# Patient Record
Sex: Female | Born: 1970 | Race: Black or African American | Hispanic: No | Marital: Single | Smoking: Former smoker
Health system: Southern US, Community
[De-identification: ages and names within clinical notes are randomized; demographics above are authoritative.]

## PROBLEM LIST (undated history)

## (undated) DIAGNOSIS — E663 Overweight: Secondary | ICD-10-CM

## (undated) DIAGNOSIS — R600 Localized edema: Secondary | ICD-10-CM

## (undated) DIAGNOSIS — Z79899 Other long term (current) drug therapy: Principal | ICD-10-CM

## (undated) DIAGNOSIS — J039 Acute tonsillitis, unspecified: Secondary | ICD-10-CM

## (undated) DIAGNOSIS — Z21 Asymptomatic human immunodeficiency virus [HIV] infection status: Secondary | ICD-10-CM

## (undated) DIAGNOSIS — R079 Chest pain, unspecified: Secondary | ICD-10-CM

## (undated) DIAGNOSIS — J852 Abscess of lung without pneumonia: Secondary | ICD-10-CM

## (undated) DIAGNOSIS — R110 Nausea: Secondary | ICD-10-CM

## (undated) DIAGNOSIS — E881 Lipodystrophy, not elsewhere classified: Principal | ICD-10-CM

## (undated) DIAGNOSIS — R51 Headache: Secondary | ICD-10-CM

## (undated) DIAGNOSIS — R635 Abnormal weight gain: Secondary | ICD-10-CM

## (undated) DIAGNOSIS — R0789 Other chest pain: Secondary | ICD-10-CM

## (undated) DIAGNOSIS — J069 Acute upper respiratory infection, unspecified: Secondary | ICD-10-CM

## (undated) DIAGNOSIS — J189 Pneumonia, unspecified organism: Secondary | ICD-10-CM

## (undated) DIAGNOSIS — A159 Respiratory tuberculosis unspecified: Secondary | ICD-10-CM

## (undated) DIAGNOSIS — B20 Human immunodeficiency virus [HIV] disease: Secondary | ICD-10-CM

## (undated) DIAGNOSIS — I10 Essential (primary) hypertension: Secondary | ICD-10-CM

## (undated) HISTORY — DX: Chest pain, unspecified: R07.9

## (undated) HISTORY — DX: Other chest pain: R07.89

## (undated) HISTORY — DX: Overweight: E66.3

## (undated) HISTORY — DX: Localized edema: R60.0

## (undated) HISTORY — DX: Nausea: R11.0

## (undated) HISTORY — DX: Other long term (current) drug therapy: Z79.899

## (undated) HISTORY — DX: Headache: R51

## (undated) HISTORY — DX: Lipodystrophy, not elsewhere classified: E88.1

## (undated) HISTORY — DX: Abnormal weight gain: R63.5

## (undated) HISTORY — DX: Acute upper respiratory infection, unspecified: J06.9

## (undated) HISTORY — DX: Human immunodeficiency virus (HIV) disease: B20

## (undated) HISTORY — DX: Morbid (severe) obesity due to excess calories: E66.01

## (undated) HISTORY — DX: Abscess of lung without pneumonia: J85.2

---

## 1998-03-12 ENCOUNTER — Emergency Department (HOSPITAL_COMMUNITY): Admission: EM | Admit: 1998-03-12 | Discharge: 1998-03-12 | Payer: Self-pay | Admitting: Emergency Medicine

## 1998-11-17 ENCOUNTER — Emergency Department (HOSPITAL_COMMUNITY): Admission: EM | Admit: 1998-11-17 | Discharge: 1998-11-17 | Payer: Self-pay | Admitting: Emergency Medicine

## 1999-06-03 ENCOUNTER — Emergency Department (HOSPITAL_COMMUNITY): Admission: EM | Admit: 1999-06-03 | Discharge: 1999-06-03 | Payer: Self-pay | Admitting: Emergency Medicine

## 1999-12-31 ENCOUNTER — Emergency Department (HOSPITAL_COMMUNITY): Admission: EM | Admit: 1999-12-31 | Discharge: 1999-12-31 | Payer: Self-pay | Admitting: Emergency Medicine

## 1999-12-31 ENCOUNTER — Encounter: Payer: Self-pay | Admitting: Emergency Medicine

## 2000-04-12 ENCOUNTER — Emergency Department (HOSPITAL_COMMUNITY): Admission: EM | Admit: 2000-04-12 | Discharge: 2000-04-12 | Payer: Self-pay | Admitting: Emergency Medicine

## 2001-03-20 ENCOUNTER — Emergency Department (HOSPITAL_COMMUNITY): Admission: EM | Admit: 2001-03-20 | Discharge: 2001-03-20 | Payer: Self-pay | Admitting: Emergency Medicine

## 2001-05-12 ENCOUNTER — Emergency Department (HOSPITAL_COMMUNITY): Admission: EM | Admit: 2001-05-12 | Discharge: 2001-05-12 | Payer: Self-pay | Admitting: Emergency Medicine

## 2001-05-17 ENCOUNTER — Emergency Department (HOSPITAL_COMMUNITY): Admission: EM | Admit: 2001-05-17 | Discharge: 2001-05-17 | Payer: Self-pay | Admitting: Emergency Medicine

## 2001-07-02 ENCOUNTER — Emergency Department (HOSPITAL_COMMUNITY): Admission: EM | Admit: 2001-07-02 | Discharge: 2001-07-02 | Payer: Self-pay | Admitting: Emergency Medicine

## 2001-12-23 ENCOUNTER — Emergency Department (HOSPITAL_COMMUNITY): Admission: EM | Admit: 2001-12-23 | Discharge: 2001-12-23 | Payer: Self-pay | Admitting: Emergency Medicine

## 2001-12-29 ENCOUNTER — Emergency Department (HOSPITAL_COMMUNITY): Admission: EM | Admit: 2001-12-29 | Discharge: 2001-12-29 | Payer: Self-pay | Admitting: Emergency Medicine

## 2002-11-26 ENCOUNTER — Emergency Department (HOSPITAL_COMMUNITY): Admission: EM | Admit: 2002-11-26 | Discharge: 2002-11-26 | Payer: Self-pay | Admitting: Emergency Medicine

## 2003-02-02 ENCOUNTER — Emergency Department (HOSPITAL_COMMUNITY): Admission: EM | Admit: 2003-02-02 | Discharge: 2003-02-02 | Payer: Self-pay | Admitting: Emergency Medicine

## 2003-10-15 ENCOUNTER — Emergency Department (HOSPITAL_COMMUNITY): Admission: EM | Admit: 2003-10-15 | Discharge: 2003-10-15 | Payer: Self-pay | Admitting: Emergency Medicine

## 2003-10-16 ENCOUNTER — Emergency Department (HOSPITAL_COMMUNITY): Admission: EM | Admit: 2003-10-16 | Discharge: 2003-10-16 | Payer: Self-pay | Admitting: Family Medicine

## 2004-08-10 ENCOUNTER — Emergency Department (HOSPITAL_COMMUNITY): Admission: EM | Admit: 2004-08-10 | Discharge: 2004-08-10 | Payer: Self-pay | Admitting: Family Medicine

## 2004-08-31 ENCOUNTER — Emergency Department (HOSPITAL_COMMUNITY): Admission: EM | Admit: 2004-08-31 | Discharge: 2004-08-31 | Payer: Self-pay | Admitting: Family Medicine

## 2004-10-31 ENCOUNTER — Emergency Department (HOSPITAL_COMMUNITY): Admission: EM | Admit: 2004-10-31 | Discharge: 2004-10-31 | Payer: Self-pay | Admitting: Family Medicine

## 2005-03-30 ENCOUNTER — Emergency Department (HOSPITAL_COMMUNITY): Admission: EM | Admit: 2005-03-30 | Discharge: 2005-03-30 | Payer: Self-pay | Admitting: Family Medicine

## 2005-05-01 ENCOUNTER — Emergency Department (HOSPITAL_COMMUNITY): Admission: EM | Admit: 2005-05-01 | Discharge: 2005-05-01 | Payer: Self-pay | Admitting: Family Medicine

## 2005-05-07 ENCOUNTER — Emergency Department (HOSPITAL_COMMUNITY): Admission: EM | Admit: 2005-05-07 | Discharge: 2005-05-07 | Payer: Self-pay | Admitting: Family Medicine

## 2005-07-12 ENCOUNTER — Emergency Department (HOSPITAL_COMMUNITY): Admission: EM | Admit: 2005-07-12 | Discharge: 2005-07-12 | Payer: Self-pay | Admitting: Family Medicine

## 2005-07-20 ENCOUNTER — Emergency Department (HOSPITAL_COMMUNITY): Admission: EM | Admit: 2005-07-20 | Discharge: 2005-07-20 | Payer: Self-pay | Admitting: Family Medicine

## 2005-07-23 ENCOUNTER — Emergency Department (HOSPITAL_COMMUNITY): Admission: EM | Admit: 2005-07-23 | Discharge: 2005-07-23 | Payer: Self-pay | Admitting: Emergency Medicine

## 2006-02-01 ENCOUNTER — Emergency Department (HOSPITAL_COMMUNITY): Admission: EM | Admit: 2006-02-01 | Discharge: 2006-02-01 | Payer: Self-pay | Admitting: Family Medicine

## 2006-03-10 ENCOUNTER — Emergency Department (HOSPITAL_COMMUNITY): Admission: EM | Admit: 2006-03-10 | Discharge: 2006-03-10 | Payer: Self-pay | Admitting: Family Medicine

## 2006-07-03 ENCOUNTER — Emergency Department (HOSPITAL_COMMUNITY): Admission: EM | Admit: 2006-07-03 | Discharge: 2006-07-03 | Payer: Self-pay | Admitting: Family Medicine

## 2006-07-26 ENCOUNTER — Emergency Department (HOSPITAL_COMMUNITY): Admission: EM | Admit: 2006-07-26 | Discharge: 2006-07-26 | Payer: Self-pay | Admitting: Emergency Medicine

## 2006-08-05 ENCOUNTER — Emergency Department (HOSPITAL_COMMUNITY): Admission: EM | Admit: 2006-08-05 | Discharge: 2006-08-05 | Payer: Self-pay | Admitting: Emergency Medicine

## 2006-09-14 ENCOUNTER — Emergency Department (HOSPITAL_COMMUNITY): Admission: EM | Admit: 2006-09-14 | Discharge: 2006-09-14 | Payer: Self-pay | Admitting: Emergency Medicine

## 2006-11-05 ENCOUNTER — Emergency Department (HOSPITAL_COMMUNITY): Admission: EM | Admit: 2006-11-05 | Discharge: 2006-11-05 | Payer: Self-pay | Admitting: Family Medicine

## 2006-12-02 ENCOUNTER — Emergency Department (HOSPITAL_COMMUNITY): Admission: EM | Admit: 2006-12-02 | Discharge: 2006-12-02 | Payer: Self-pay | Admitting: Family Medicine

## 2007-02-21 ENCOUNTER — Emergency Department (HOSPITAL_COMMUNITY): Admission: EM | Admit: 2007-02-21 | Discharge: 2007-02-21 | Payer: Self-pay | Admitting: Emergency Medicine

## 2007-05-02 ENCOUNTER — Emergency Department (HOSPITAL_COMMUNITY): Admission: EM | Admit: 2007-05-02 | Discharge: 2007-05-02 | Payer: Self-pay | Admitting: Family Medicine

## 2007-06-22 ENCOUNTER — Emergency Department (HOSPITAL_COMMUNITY): Admission: EM | Admit: 2007-06-22 | Discharge: 2007-06-22 | Payer: Self-pay | Admitting: Family Medicine

## 2007-07-03 ENCOUNTER — Emergency Department (HOSPITAL_COMMUNITY): Admission: EM | Admit: 2007-07-03 | Discharge: 2007-07-03 | Payer: Self-pay | Admitting: Emergency Medicine

## 2007-08-04 ENCOUNTER — Emergency Department (HOSPITAL_COMMUNITY): Admission: EM | Admit: 2007-08-04 | Discharge: 2007-08-04 | Payer: Self-pay | Admitting: Family Medicine

## 2007-11-18 ENCOUNTER — Emergency Department (HOSPITAL_COMMUNITY): Admission: EM | Admit: 2007-11-18 | Discharge: 2007-11-18 | Payer: Self-pay | Admitting: Emergency Medicine

## 2010-04-03 ENCOUNTER — Emergency Department (HOSPITAL_COMMUNITY): Admission: EM | Admit: 2010-04-03 | Discharge: 2010-04-03 | Payer: Self-pay | Admitting: Emergency Medicine

## 2011-06-05 LAB — I-STAT 8, (EC8 V) (CONVERTED LAB)
Acid-base deficit: 1
BUN: 10
Bicarbonate: 24.3 — ABNORMAL HIGH
Chloride: 108
Glucose, Bld: 92
HCT: 39
Hemoglobin: 13.3
Operator id: 161631
Potassium: 3.7
Sodium: 140
TCO2: 26
pCO2, Ven: 42.5 — ABNORMAL LOW
pH, Ven: 7.366 — ABNORMAL HIGH

## 2011-06-05 LAB — POCT CARDIAC MARKERS
CKMB, poc: <1 — ABNORMAL LOW
Myoglobin, poc: 52.7
Operator id: 161631
Troponin i, poc: <0.05

## 2011-06-06 LAB — I-STAT 8, (EC8 V) (CONVERTED LAB)
BUN: 10
Bicarbonate: 20.6
Chloride: 110
HCT: 42
Hemoglobin: 14.3
Operator id: 284141
Potassium: 4.1
Sodium: 138

## 2011-06-06 LAB — POCT I-STAT CREATININE: Creatinine, Ser: 0.8

## 2011-10-29 ENCOUNTER — Emergency Department (HOSPITAL_COMMUNITY)
Admission: EM | Admit: 2011-10-29 | Discharge: 2011-10-29 | Disposition: A | Discharge status: Home / Self Care | Payer: Self-pay | Attending: Emergency Medicine | Admitting: Emergency Medicine

## 2011-10-29 ENCOUNTER — Encounter (HOSPITAL_COMMUNITY): Payer: Self-pay

## 2011-10-29 ENCOUNTER — Emergency Department (HOSPITAL_COMMUNITY): Payer: Self-pay

## 2011-10-29 DIAGNOSIS — J029 Acute pharyngitis, unspecified: Secondary | ICD-10-CM | POA: Insufficient documentation

## 2011-10-29 DIAGNOSIS — I10 Essential (primary) hypertension: Secondary | ICD-10-CM | POA: Insufficient documentation

## 2011-10-29 HISTORY — DX: Essential (primary) hypertension: I10

## 2011-10-29 HISTORY — DX: Acute tonsillitis, unspecified: J03.90

## 2011-10-29 LAB — POCT I-STAT, CHEM 8
Calcium, Ion: 1.14 mmol/L (ref 1.12–1.32)
Chloride: 105 mEq/L (ref 96–112)
HCT: 37 % (ref 36.0–46.0)
Hemoglobin: 12.6 g/dL (ref 12.0–15.0)
TCO2: 24 mmol/L (ref 0–100)

## 2011-10-29 LAB — BASIC METABOLIC PANEL
BUN: 3 mg/dL — ABNORMAL LOW (ref 6–23)
Creatinine, Ser: 0.54 mg/dL (ref 0.50–1.10)
GFR calc Af Amer: >90 mL/min (ref >90)
GFR calc non Af Amer: >90 mL/min (ref >90)
Potassium: 3.5 mEq/L (ref 3.5–5.1)

## 2011-10-29 LAB — CBC
HCT: 34.4 % — ABNORMAL LOW (ref 36.0–46.0)
MCHC: 34.3 g/dL (ref 30.0–36.0)
RDW: 13.8 % (ref 11.5–15.5)

## 2011-10-29 MED ORDER — SODIUM CHLORIDE 0.9 % IV BOLUS (SEPSIS)
1000.0000 mL | Freq: Once | INTRAVENOUS | Status: AC
  Administered 2011-10-29: 1000 mL via INTRAVENOUS

## 2011-10-29 MED ORDER — IBUPROFEN 600 MG PO TABS: 600.0000 mg | ORAL_TABLET | Freq: Four times a day (QID) | ORAL | Status: AC | PRN

## 2011-10-29 MED ORDER — HYDROCODONE-ACETAMINOPHEN 5-500 MG PO TABS: 1.0000 | ORAL_TABLET | Freq: Four times a day (QID) | ORAL | Status: AC | PRN

## 2011-10-29 MED ORDER — IOHEXOL 300 MG/ML  SOLN
75.0000 mL | Freq: Once | INTRAMUSCULAR | Status: AC | PRN
  Administered 2011-10-29: 75 mL via INTRAVENOUS

## 2011-10-29 MED ORDER — AMOXICILLIN 500 MG PO CAPS: 500.0000 mg | ORAL_CAPSULE | Freq: Three times a day (TID) | ORAL | Status: AC

## 2011-10-29 MED ORDER — DEXTROSE 5 % IV SOLN
1.0000 g | INTRAVENOUS | Status: DC
  Administered 2011-10-29: 1 g via INTRAVENOUS
  Filled 2011-10-29: qty 10

## 2011-10-29 MED ORDER — DEXAMETHASONE SODIUM PHOSPHATE 4 MG/ML IJ SOLN
10.0000 mg | Freq: Once | INTRAMUSCULAR | Status: AC
  Administered 2011-10-29: 10 mg via INTRAVENOUS
  Filled 2011-10-29: qty 3

## 2011-10-29 MED ORDER — ACETAMINOPHEN 500 MG PO TABS
1000.0000 mg | ORAL_TABLET | Freq: Once | ORAL | Status: AC
  Administered 2011-10-29: 1000 mg via ORAL
  Filled 2011-10-29: qty 2

## 2011-10-29 NOTE — ED Notes (Signed)
Patient presents with swollen tonsils since this past Saturday. Patient reports difficulty breathing and is spiting in an emesis bag, patient reports she only able to swallow a small amount.  Patient has history of swollen tonsils in past. Patient speaking in full, complete sentences, no respiratory distress noted at this time.

## 2011-10-29 NOTE — ED Provider Notes (Addendum)
History     CSN: [id number]  Arrival date & time 10/29/11  1239   First MD Initiated Contact with Patient 10/29/11 1450      Chief Complaint  Patient presents with  . Sore Throat    (Consider location/radiation/quality/duration/timing/severity/associated sxs/prior treatment) Patient is a 41 y.o. female presenting with pharyngitis. The history is provided by the patient.  Sore Throat Pertinent negatives include no abdominal pain and no shortness of breath.  pt c/o sore throat for past 2 days. States very painful to swallow. No shortness or breathing. Denies nasal congestion or cough. No headache. No abd pain or nvd. No known ill contacts. No known mono or strep exposure. Pain is bilateral. No trouble speaking. Pain dull, constant, worse w swallowing.   Past Medical History  Diagnosis Date  . Tonsillitis   . Hypertension     Past Surgical History  Procedure Date  . Cesarean section     History reviewed. No pertinent family history.  History  Substance Use Topics  . Smoking status: Never Smoker   . Smokeless tobacco: Not on file  . Alcohol Use: No    OB History    Grav Para Term Preterm Abortions TAB SAB Ect Mult Living                  Review of Systems  Constitutional: Positive for fever. Negative for chills.  HENT: Negative for neck pain and neck stiffness.   Respiratory: Negative for cough and shortness of breath.   Gastrointestinal: Negative for vomiting, abdominal pain and diarrhea.  Skin: Negative for rash.  Neurological: Negative for light-headedness.    Allergies  Review of patient's allergies indicates no known allergies.  Home Medications   Current Outpatient Rx  Name Route Sig Dispense Refill  . ACETAMINOPHEN 500 MG PO TABS Oral Take 1,000 mg by mouth every 6 (six) hours as needed. For pain    . PRESCRIPTION MEDICATION  Pt states she takes a BP pill. Called pharmacy and they don't have anything on file      BP 173/126  Pulse 103  Temp(Src)  99.4 F (37.4 C) (Oral)  Resp 19  SpO2 98%  LMP 08/31/2011  Physical Exam  Nursing note and vitals reviewed. Constitutional: She is oriented to person, place, and time. She appears well-developed and well-nourished. No distress.  HENT:  Nose: Nose normal.       bil tonsils quite swollen. +exudate. No asymmetric swelling or abscess noted. No trismus. No stridor.   Eyes: Conjunctivae are normal. No scleral icterus.  Neck: Neck supple. No tracheal deviation present.       No neck mass or swelling noted. Ant cerv l/a, no post cerv l/a. No stiffness or rigidity  Cardiovascular: Normal rate, regular rhythm, normal heart sounds and intact distal pulses.   Pulmonary/Chest: Effort normal and breath sounds normal. No respiratory distress.       No stridor  Abdominal: Soft. Normal appearance. She exhibits no distension. There is no tenderness.       No hsm.   Musculoskeletal: She exhibits no edema and no tenderness.  Neurological: She is alert and oriented to person, place, and time.  Skin: Skin is warm and dry. No rash noted.       No rash, no petechia  Psychiatric: She has a normal mood and affect.    ED Course  Procedures (including critical care time)    MDM  Poor po intake today due to painful swallowing. Mod bil swelling  noted. No stridor or inc wob. 1 liter ns iv. Decadron iv. Rocephin iv.    Recheck feels improved. No increased wob. No stridor. No trouble swallowing or handling secretions. Discussed need close f/u pcp, or here if unable to see a pcp, in next couple days for recheck, as well as for recheck bp.   On recheck prior to d/c pt notes worsening left neck/left throat pain, trouble swallowing. Is in no resp distress. Will get ct r/o abscess. [facility].       Suzi Roots, MD 10/29/11 1707  Suzi Roots, MD 10/29/11 [phone]

## 2011-10-29 NOTE — Discharge Instructions (Signed)
Drink plenty of fluids. Take antibiotic (amoxicillin) as prescribed. Take motrin as need for pain.  You may also take vicodin as need for pain. No driving for the next 6 hours or when taking vicodin. Also, do not take tylenol or acetaminophen containing medication when taking vicodin.   You may try throat lozenges as need for symptom relief.   Follow up with primary care doctor in 1-2 days for recheck.  Your blood pressure is also quite high today - you must follow up with primary care doctor in the next couple days for recheck of blood pressure.   Your ct scan was read as showing pharyngitis, but no abscess. Note was also made of upper dental abscesses - follow up with dentist in coming week.   Return to ER right away if worse, unable to swallow, trouble breathing, worsening one-sided throat pain or swelling, other concern.       Pharyngitis, Viral and Bacterial Pharyngitis is soreness (inflammation) or infection of the pharynx. It is also called a sore throat. CAUSES  Most sore throats are caused by viruses and are part of a cold. However, some sore throats are caused by strep and other bacteria. Sore throats can also be caused by post nasal drip from draining sinuses, allergies and sometimes from sleeping with an open mouth. Infectious sore throats can be spread from person to person by coughing, sneezing and sharing cups or eating utensils. TREATMENT  Sore throats that are viral usually last 3-4 days. Viral illness will get better without medications (antibiotics). Strep throat and other bacterial infections will usually begin to get better about 24-48 hours after you begin to take antibiotics. HOME CARE INSTRUCTIONS   If the caregiver feels there is a bacterial infection or if there is a positive strep test, they will prescribe an antibiotic. The full course of antibiotics must be taken. If the full course of antibiotic is not taken, you or your child may become ill again. If you or your  child has strep throat and do not finish all of the medication, serious heart or kidney diseases may develop.   Drink enough water and fluids to keep your urine clear or pale yellow.   Only take over-the-counter or prescription medicines for pain, discomfort or fever as directed by your caregiver.   Get lots of rest.   Gargle with salt water ( tsp. of salt in a glass of water) as often as every 1-2 hours as you need for comfort.   Hard candies may soothe the throat if individual is not at risk for choking. Throat sprays or lozenges may also be used.  SEEK MEDICAL CARE IF:   Large, tender lumps in the neck develop.   A rash develops.   Green, yellow-brown or bloody sputum is coughed up.   Your baby is older than 3 months with a rectal temperature of 100.5 F (38.1 C) or higher for more than 1 day.  SEEK IMMEDIATE MEDICAL CARE IF:   A stiff neck develops.   You or your child are drooling or unable to swallow liquids.   You or your child are vomiting, unable to keep medications or liquids down.   You or your child has severe pain, unrelieved with recommended medications.   You or your child are having difficulty breathing (not due to stuffy nose).   You or your child are unable to fully open your mouth.   You or your child develop redness, swelling, or severe pain anywhere on the  neck.   You have a fever.   Your baby is older than 3 months with a rectal temperature of 102 F (38.9 C) or higher.   Your baby is 39 months old or younger with a rectal temperature of 100.4 F (38 C) or higher.  MAKE SURE YOU:   Understand these instructions.   Will watch your condition.   Will get help right away if you are not doing well or get worse.  Document Released: 08/13/2005 Document Revised: 08/02/2011 Document Reviewed: 11/10/2007 [facility] Patient Information 2012 [place], [place].     Sore Throat Sore throats may be caused by bacteria and viruses. They may also be caused  by:  Smoking.   Pollution.   Allergies.  If a sore throat is due to strep infection (a bacterial infection), you may need:  A throat swab.   A culture test to verify the strep infection.  You will need one of these:  An antibiotic shot.   Oral medicine for a full 10 days.  Strep infection is very contagious. A doctor should check any close contacts who have a sore throat or fever. A sore throat caused by a virus infection will usually last only 3-4 days. Antibiotics will not treat a viral sore throat.  Infectious mononucleosis (a viral disease), however, can cause a sore throat that lasts for up to 3 weeks. Mononucleosis can be diagnosed with blood tests. You must have been sick for at least 1 week in order for the test to give accurate results. HOME CARE INSTRUCTIONS   To treat a sore throat, take mild pain medicine.   Increase your fluids.   Eat a soft diet.   Do not smoke.   Gargling with warm water or salt water (1 tsp. salt in 8 oz. water) can be helpful.   Try throat sprays or lozenges or sucking on hard candy to ease the symptoms.  Call your doctor if your sore throat lasts longer than 1 week.  SEEK IMMEDIATE MEDICAL CARE IF:  You have difficulty breathing.   You have increased swelling in the throat.   You have pain so severe that you are unable to swallow fluids or your saliva.   You have a severe headache, a high fever, vomiting, or a red rash.  Document Released: 09/20/2004 Document Revised: 08/02/2011 Document Reviewed: 07/31/2007 [facility] Patient Information 2012 [place], [place].    Hypertension As your heart beats, it forces blood through your arteries. This force is your blood pressure. If the pressure is too high, it is called hypertension (HTN) or high blood pressure. HTN is dangerous because you may have it and not know it. High blood pressure may mean that your heart has to work harder to pump blood. Your arteries may be narrow or stiff. The extra  work puts you at risk for heart disease, stroke, and other problems.  Blood pressure consists of two numbers, a higher number over a lower, 110/72, for example. It is stated as "110 over 72." The ideal is below 120 for the top number (systolic) and under 80 for the bottom (diastolic). Write down your blood pressure today. You should pay close attention to your blood pressure if you have certain conditions such as:  Heart failure.   Prior heart attack.   Diabetes   Chronic kidney disease.   Prior stroke.   Multiple risk factors for heart disease.  To see if you have HTN, your blood pressure should be measured while you are seated with  your arm held at the level of the heart. It should be measured at least twice. A one-time elevated blood pressure reading (especially in the Emergency Department) does not mean that you need treatment. There may be conditions in which the blood pressure is different between your right and left arms. It is important to see your caregiver soon for a recheck. Most people have essential hypertension which means that there is not a specific cause. This type of high blood pressure may be lowered by changing lifestyle factors such as:  Stress.   Smoking.   Lack of exercise.   Excessive weight.   Drug/tobacco/alcohol use.   Eating less salt.  Most people do not have symptoms from high blood pressure until it has caused damage to the body. Effective treatment can often prevent, delay or reduce that damage. TREATMENT  When a cause has been identified, treatment for high blood pressure is directed at the cause. There are a large number of medications to treat HTN. These fall into several categories, and your caregiver will help you select the medicines that are best for you. Medications may have side effects. You should review side effects with your caregiver. If your blood pressure stays high after you have made lifestyle changes or started on medicines,   Your  medication(s) may need to be changed.   Other problems may need to be addressed.   Be certain you understand your prescriptions, and know how and when to take your medicine.   Be sure to follow up with your caregiver within the time frame advised (usually within two weeks) to have your blood pressure rechecked and to review your medications.   If you are taking more than one medicine to lower your blood pressure, make sure you know how and at what times they should be taken. Taking two medicines at the same time can result in blood pressure that is too low.  SEEK IMMEDIATE MEDICAL CARE IF:  You develop a severe headache, blurred or changing vision, or confusion.   You have unusual weakness or numbness, or a faint feeling.   You have severe chest or abdominal pain, vomiting, or breathing problems.  MAKE SURE YOU:   Understand these instructions.   Will watch your condition.   Will get help right away if you are not doing well or get worse.  Document Released: 08/13/2005 Document Revised: 08/02/2011 Document Reviewed: 04/02/2008 [facility] Patient Information 2012 [place], [place].          RESOURCE GUIDE  Dental Problems  Patients with Medicaid: [facility] [phone]                                           [phone] Phone:  [phone]                                                  Phone:  [phone]  If unable to pay or uninsured, contact:  [facility] or [facility]. to become qualified for the adult dental clinic.  Chronic Pain Problems Contact [facility]  [phone] Patients need to be referred by their primary care doctor.  Insufficient Money for Medicine Contact [employer]:  call "211" or [facility] [phone].  No Primary Care Doctor Call [facility]  [phone] Other agencies that provide inexpensive medical care    [facility]   [phone]    [facility]  [phone]    [facility]  [phone]    [facility]  [phone]    [facility]  [phone]    [facility]  [phone]  Psychological Services [facility] Behavior[facility]  [phone] [facility] Services  [phone] [facility]   [phone] (emergency services [phone])  Substance Abuse Resources [facility]  [phone] [facility] [phone] [facility] [phone] Floydene Flock [phone] Residential & Outpatient Substance Abuse Program  [phone]  Abuse/Neglect [facility] Child Abuse Hotline 7192183764 [facility] Child Abuse Hotline [phone] (After Hours)  Emergency Shelter [facility] 7021865809  Maternity Homes [facility] [place] [facility] [phone] [facility] [phone]  MRSA Hotline #:   (586)858-0059    [facility] Resources  [facility]     [employer]                          [facility] Dept. [address] [place]                       [address]      [address]  Blondell Reveal Phone:  [phone]                                   Phone:  [phone]                 Phone:  [phone]  [facility] Phone:  [phone]  [facility] Child Abuse Hotline [phone] [phone] (After Hours)

## 2012-10-14 ENCOUNTER — Emergency Department (HOSPITAL_COMMUNITY)
Admission: EM | Admit: 2012-10-14 | Discharge: 2012-10-14 | Disposition: A | Discharge status: Home / Self Care | Payer: Self-pay | Attending: Emergency Medicine | Admitting: Emergency Medicine

## 2012-10-14 ENCOUNTER — Encounter (HOSPITAL_COMMUNITY): Payer: Self-pay | Admitting: Neurology

## 2012-10-14 DIAGNOSIS — K047 Periapical abscess without sinus: Secondary | ICD-10-CM | POA: Insufficient documentation

## 2012-10-14 DIAGNOSIS — K089 Disorder of teeth and supporting structures, unspecified: Secondary | ICD-10-CM | POA: Insufficient documentation

## 2012-10-14 DIAGNOSIS — I10 Essential (primary) hypertension: Secondary | ICD-10-CM | POA: Insufficient documentation

## 2012-10-14 DIAGNOSIS — H9209 Otalgia, unspecified ear: Secondary | ICD-10-CM | POA: Insufficient documentation

## 2012-10-14 DIAGNOSIS — K0889 Other specified disorders of teeth and supporting structures: Secondary | ICD-10-CM

## 2012-10-14 MED ORDER — OXYCODONE-ACETAMINOPHEN 5-325 MG PO TABS
2.0000 | ORAL_TABLET | Freq: Once | ORAL | Status: AC
  Administered 2012-10-14: 2 via ORAL
  Filled 2012-10-14: qty 2

## 2012-10-14 MED ORDER — PENICILLIN V POTASSIUM 500 MG PO TABS: 500.0000 mg | ORAL_TABLET | Freq: Four times a day (QID) | ORAL | Status: AC

## 2012-10-14 MED ORDER — OXYCODONE-ACETAMINOPHEN 5-325 MG PO TABS: 2.0000 | ORAL_TABLET | ORAL | Status: DC | PRN

## 2012-10-14 NOTE — ED Notes (Signed)
Pt reporting tooth abscess x several days. Right cheek swollen, painful to touch. Denies drainage. Airway intact. Alert and oriented

## 2012-10-15 NOTE — ED Provider Notes (Signed)
History     CSN: [id number]  Arrival date & time 10/14/12  0827   None     Chief Complaint  Patient presents with  . Abscess    (Consider location/radiation/quality/duration/timing/severity/associated sxs/prior treatment) Patient is a 42 y.o. female presenting with abscess. The history is provided by the patient.  Abscess Location:  Mouth Mouth abscess location:  R inner cheek Abscess quality: induration, painful, redness and warmth   Abscess quality: not draining and no fluctuance   Red streaking: no   Progression:  Worsening Pain details:    Quality:  Aching, hot and throbbing   Severity:  Severe   Timing:  Constant   Progression:  Worsening Chronicity:  New Context: not diabetes, not immunosuppression, not injected drug use, not insect bite/sting and not skin injury   Relieved by:  NSAIDs Ineffective treatments:  None tried Associated symptoms: no fever   Risk factors: no prior abscess   .  Past Medical History  Diagnosis Date  . Tonsillitis   . Hypertension     Past Surgical History  Procedure Laterality Date  . Cesarean section      No family history on file.  History  Substance Use Topics  . Smoking status: Never Smoker   . Smokeless tobacco: Not on file  . Alcohol Use: No    OB History   Grav Para Term Preterm Abortions TAB SAB Ect Mult Living                  Review of Systems  Constitutional: Negative for fever and chills.  HENT: Positive for ear pain and dental problem. Negative for sore throat and mouth sores.   Eyes: Negative.   Respiratory: Negative.  Negative for shortness of breath and stridor.   Gastrointestinal: Negative.   Endocrine: Negative.   Genitourinary: Negative.   Musculoskeletal: Negative.   Skin: Negative.   Allergic/Immunologic: Negative.   Neurological: Negative.   Psychiatric/Behavioral: Negative.     Allergies  Review of patient's allergies indicates no known allergies.  Home Medications   Current  Outpatient Rx  Name  Route  Sig  Dispense  Refill  . acetaminophen (TYLENOL) 500 MG tablet   Oral   Take 1,000 mg by mouth every 6 (six) hours as needed. For pain         . oxyCODONE-acetaminophen (PERCOCET) 5-325 MG per tablet   Oral   Take 2 tablets by mouth every 4 (four) hours as needed for pain.   10 tablet   0   . penicillin v potassium (VEETID) 500 MG tablet   Oral   Take 1 tablet (500 mg total) by mouth 4 (four) times daily.   40 tablet   0     BP 154/108  Pulse 96  Temp(Src) 99.1 F (37.3 C) (Oral)  Resp 20  Ht 5\' 1"  (1.549 m)  Wt 150 lb (68.04 kg)  BMI 28.36 kg/m2  SpO2 100%  Physical Exam  Constitutional: She is oriented to person, place, and time. She appears well-developed and well-nourished. No distress.  HENT:  Head: Normocephalic and atraumatic. No trismus in the jaw.    Mouth/Throat: Uvula is midline and oropharynx is clear and moist. Dental abscesses and dental caries present. No edematous. No posterior oropharyngeal edema.  Eyes: Conjunctivae are normal. No scleral icterus.  Neck: Normal range of motion.  Cardiovascular: Normal rate, regular rhythm and normal heart sounds.  Exam reveals no gallop and no friction rub.   No murmur heard. Pulmonary/Chest:  Effort normal and breath sounds normal. No respiratory distress.  Abdominal: Soft. Bowel sounds are normal. She exhibits no distension and no mass. There is no tenderness. There is no guarding.  Neurological: She is alert and oriented to person, place, and time.  Skin: Skin is warm and dry. She is not diaphoretic.    ED Course  Procedures (including critical care time)  Labs Reviewed - No data to display No results found.   1. Pain, dental   2. Dental abscess       MDM  BP 154/108  Pulse 96  Temp(Src) 99.1 F (37.3 C) (Oral)  Resp 20  Ht 5\' 1"  (1.549 m)  Wt 150 lb (68.04 kg)  BMI 28.36 kg/m2  SpO2 100% Patient with toothache. abssces present, but not amenable to incesion. .   Exam unconcerning for Ludwig's angina or spread of infection.  Will treat with penicillin and pain medicine.  Urged patient to follow-up with dentist.           Arthor Captain, PA-C 10/15/12 1705

## 2012-10-16 NOTE — ED Provider Notes (Signed)
Medical screening examination/treatment/procedure(s) were performed by non-physician practitioner and as supervising physician I was immediately available for consultation/collaboration.   Michaell Grider III, MD 10/16/12 1321 

## 2013-01-30 ENCOUNTER — Telehealth: Payer: Self-pay

## 2013-01-30 NOTE — Telephone Encounter (Signed)
Tiera calling to see if patient has appointment.   She was informed several attempts made to reach the patent via phone with messages left on her voicemail.  The patient is saying I did not call.   I called patient this morning to schedule appointment and she answered the phone.  Appointment given for February 10, 2013 @ 10:30 am .  Laurell Josephs, RN

## 2013-02-10 ENCOUNTER — Ambulatory Visit: Payer: Self-pay

## 2013-02-12 ENCOUNTER — Ambulatory Visit: Payer: Self-pay

## 2013-04-09 ENCOUNTER — Emergency Department (HOSPITAL_COMMUNITY): Payer: Self-pay

## 2013-04-09 ENCOUNTER — Inpatient Hospital Stay (HOSPITAL_COMMUNITY)
Admission: EM | Admit: 2013-04-09 | Discharge: 2013-04-22 | DRG: 974 | Disposition: A | Discharge status: Home / Self Care | Payer: MEDICAID | Attending: Internal Medicine | Admitting: Internal Medicine

## 2013-04-09 ENCOUNTER — Encounter (HOSPITAL_COMMUNITY): Payer: Self-pay | Admitting: Neurology

## 2013-04-09 DIAGNOSIS — S270XXS Traumatic pneumothorax, sequela: Secondary | ICD-10-CM

## 2013-04-09 DIAGNOSIS — J852 Abscess of lung without pneumonia: Secondary | ICD-10-CM | POA: Diagnosis present

## 2013-04-09 DIAGNOSIS — Z79899 Other long term (current) drug therapy: Secondary | ICD-10-CM

## 2013-04-09 DIAGNOSIS — K029 Dental caries, unspecified: Secondary | ICD-10-CM | POA: Diagnosis present

## 2013-04-09 DIAGNOSIS — N179 Acute kidney failure, unspecified: Secondary | ICD-10-CM

## 2013-04-09 DIAGNOSIS — E871 Hypo-osmolality and hyponatremia: Secondary | ICD-10-CM

## 2013-04-09 DIAGNOSIS — I10 Essential (primary) hypertension: Secondary | ICD-10-CM | POA: Diagnosis present

## 2013-04-09 DIAGNOSIS — B20 Human immunodeficiency virus [HIV] disease: Secondary | ICD-10-CM

## 2013-04-09 DIAGNOSIS — J9600 Acute respiratory failure, unspecified whether with hypoxia or hypercapnia: Secondary | ICD-10-CM | POA: Diagnosis present

## 2013-04-09 DIAGNOSIS — Z87891 Personal history of nicotine dependence: Secondary | ICD-10-CM

## 2013-04-09 DIAGNOSIS — J851 Abscess of lung with pneumonia: Secondary | ICD-10-CM | POA: Diagnosis present

## 2013-04-09 DIAGNOSIS — Z7982 Long term (current) use of aspirin: Secondary | ICD-10-CM

## 2013-04-09 DIAGNOSIS — Z113 Encounter for screening for infections with a predominantly sexual mode of transmission: Secondary | ICD-10-CM

## 2013-04-09 DIAGNOSIS — R6510 Systemic inflammatory response syndrome (SIRS) of non-infectious origin without acute organ dysfunction: Secondary | ICD-10-CM

## 2013-04-09 DIAGNOSIS — A419 Sepsis, unspecified organism: Secondary | ICD-10-CM

## 2013-04-09 DIAGNOSIS — J9601 Acute respiratory failure with hypoxia: Secondary | ICD-10-CM

## 2013-04-09 DIAGNOSIS — B370 Candidal stomatitis: Secondary | ICD-10-CM | POA: Diagnosis not present

## 2013-04-09 DIAGNOSIS — R918 Other nonspecific abnormal finding of lung field: Secondary | ICD-10-CM

## 2013-04-09 DIAGNOSIS — J189 Pneumonia, unspecified organism: Secondary | ICD-10-CM | POA: Diagnosis present

## 2013-04-09 DIAGNOSIS — E876 Hypokalemia: Secondary | ICD-10-CM | POA: Diagnosis present

## 2013-04-09 DIAGNOSIS — E46 Unspecified protein-calorie malnutrition: Secondary | ICD-10-CM | POA: Diagnosis present

## 2013-04-09 DIAGNOSIS — A150 Tuberculosis of lung: Secondary | ICD-10-CM | POA: Diagnosis present

## 2013-04-09 DIAGNOSIS — J95811 Postprocedural pneumothorax: Secondary | ICD-10-CM | POA: Diagnosis not present

## 2013-04-09 LAB — BASIC METABOLIC PANEL
CO2: 18 mEq/L — ABNORMAL LOW (ref 19–32)
Chloride: 98 mEq/L (ref 96–112)
Glucose, Bld: 93 mg/dL (ref 70–99)
Sodium: 128 mEq/L — ABNORMAL LOW (ref 135–145)

## 2013-04-09 LAB — CBC
Hemoglobin: 10.8 g/dL — ABNORMAL LOW (ref 12.0–15.0)
MCV: 82.2 fL (ref 78.0–100.0)
Platelets: 199 10*3/uL (ref 150–400)
RBC: 3.59 MIL/uL — ABNORMAL LOW (ref 3.87–5.11)
WBC: 15.3 10*3/uL — ABNORMAL HIGH (ref 4.0–10.5)

## 2013-04-09 LAB — COMPREHENSIVE METABOLIC PANEL
ALT: 22 U/L (ref 0–35)
AST: 38 U/L — ABNORMAL HIGH (ref 0–37)
Albumin: 2.2 g/dL — ABNORMAL LOW (ref 3.5–5.2)
CO2: 18 mEq/L — ABNORMAL LOW (ref 19–32)
Calcium: 8.6 mg/dL (ref 8.4–10.5)
Chloride: 95 mEq/L — ABNORMAL LOW (ref 96–112)
GFR calc non Af Amer: 37 mL/min — ABNORMAL LOW (ref >90)
Sodium: 125 mEq/L — ABNORMAL LOW (ref 135–145)
Total Bilirubin: 0.6 mg/dL (ref 0.3–1.2)

## 2013-04-09 LAB — CBC WITH DIFFERENTIAL/PLATELET
Basophils Absolute: 0 10*3/uL (ref 0.0–0.1)
Lymphocytes Relative: 8 % — ABNORMAL LOW (ref 12–46)
Neutro Abs: 11.4 10*3/uL — ABNORMAL HIGH (ref 1.7–7.7)
Platelets: 190 10*3/uL (ref 150–400)
RDW: 12.7 % (ref 11.5–15.5)
WBC: 13.7 10*3/uL — ABNORMAL HIGH (ref 4.0–10.5)

## 2013-04-09 LAB — MRSA PCR SCREENING: MRSA by PCR: NEGATIVE

## 2013-04-09 LAB — CG4 I-STAT (LACTIC ACID): Lactic Acid, Venous: 1.02 mmol/L (ref 0.5–2.2)

## 2013-04-09 LAB — MAGNESIUM: Magnesium: 1.6 mg/dL (ref 1.5–2.5)

## 2013-04-09 MED ORDER — ACETAMINOPHEN 650 MG RE SUPP
650.0000 mg | Freq: Four times a day (QID) | RECTAL | Status: DC | PRN
  Administered 2013-04-15 – 2013-04-16 (×2): 650 mg via RECTAL
  Filled 2013-04-09 (×2): qty 1

## 2013-04-09 MED ORDER — SODIUM CHLORIDE 0.9 % IV BOLUS (SEPSIS)
1000.0000 mL | Freq: Once | INTRAVENOUS | Status: AC
  Administered 2013-04-09: 1000 mL via INTRAVENOUS

## 2013-04-09 MED ORDER — CLINDAMYCIN PHOSPHATE 600 MG/50ML IV SOLN
600.0000 mg | Freq: Once | INTRAVENOUS | Status: AC
  Administered 2013-04-09: 600 mg via INTRAVENOUS
  Filled 2013-04-09: qty 50

## 2013-04-09 MED ORDER — ACETAMINOPHEN 325 MG PO TABS
650.0000 mg | ORAL_TABLET | Freq: Once | ORAL | Status: AC
  Administered 2013-04-09: 650 mg via ORAL
  Filled 2013-04-09: qty 2

## 2013-04-09 MED ORDER — DEXTROSE 5 % IV SOLN
500.0000 mg | Freq: Once | INTRAVENOUS | Status: AC
  Administered 2013-04-09: 500 mg via INTRAVENOUS
  Filled 2013-04-09: qty 500

## 2013-04-09 MED ORDER — ACETAMINOPHEN 325 MG PO TABS
650.0000 mg | ORAL_TABLET | Freq: Four times a day (QID) | ORAL | Status: DC | PRN
  Administered 2013-04-09 – 2013-04-20 (×17): 650 mg via ORAL
  Filled 2013-04-09 (×18): qty 2

## 2013-04-09 MED ORDER — SODIUM CHLORIDE 0.9 % IV SOLN
INTRAVENOUS | Status: AC
  Administered 2013-04-09: 14:00:00 via INTRAVENOUS

## 2013-04-09 MED ORDER — VANCOMYCIN HCL IN DEXTROSE 1-5 GM/200ML-% IV SOLN
1000.0000 mg | INTRAVENOUS | Status: DC
  Administered 2013-04-10: 1000 mg via INTRAVENOUS
  Filled 2013-04-09 (×2): qty 200

## 2013-04-09 MED ORDER — POTASSIUM CHLORIDE CRYS ER 20 MEQ PO TBCR: 40.0000 meq | EXTENDED_RELEASE_TABLET | Freq: Once | ORAL | Status: AC

## 2013-04-09 MED ORDER — ONDANSETRON HCL 4 MG/2ML IJ SOLN
4.0000 mg | Freq: Once | INTRAMUSCULAR | Status: AC
  Administered 2013-04-09: 4 mg via INTRAVENOUS
  Filled 2013-04-09: qty 2

## 2013-04-09 MED ORDER — MORPHINE SULFATE 4 MG/ML IJ SOLN
4.0000 mg | Freq: Once | INTRAMUSCULAR | Status: AC
  Administered 2013-04-09: 4 mg via INTRAVENOUS
  Filled 2013-04-09: qty 1

## 2013-04-09 MED ORDER — POTASSIUM CHLORIDE 20 MEQ/15ML (10%) PO LIQD
40.0000 meq | Freq: Every day | ORAL | Status: DC
  Administered 2013-04-09: 40 meq via ORAL
  Filled 2013-04-09 (×2): qty 30

## 2013-04-09 MED ORDER — IOHEXOL 350 MG/ML SOLN
100.0000 mL | Freq: Once | INTRAVENOUS | Status: AC | PRN
  Administered 2013-04-09: 100 mL via INTRAVENOUS

## 2013-04-09 MED ORDER — POTASSIUM CHLORIDE CRYS ER 20 MEQ PO TBCR
EXTENDED_RELEASE_TABLET | ORAL | Status: AC
  Administered 2013-04-09: 40 meq via ORAL
  Filled 2013-04-09: qty 2

## 2013-04-09 MED ORDER — ENOXAPARIN SODIUM 40 MG/0.4ML ~~LOC~~ SOLN
40.0000 mg | SUBCUTANEOUS | Status: DC
  Administered 2013-04-09 – 2013-04-15 (×6): 40 mg via SUBCUTANEOUS
  Filled 2013-04-09 (×8): qty 0.4

## 2013-04-09 MED ORDER — SODIUM CHLORIDE 0.9 % IJ SOLN
3.0000 mL | Freq: Two times a day (BID) | INTRAMUSCULAR | Status: DC
  Administered 2013-04-09 – 2013-04-11 (×3): 3 mL via INTRAVENOUS
  Administered 2013-04-14: 11:00:00 via INTRAVENOUS
  Administered 2013-04-15 – 2013-04-21 (×12): 3 mL via INTRAVENOUS

## 2013-04-09 MED ORDER — LACTATED RINGERS IV BOLUS (SEPSIS)
1000.0000 mL | Freq: Once | INTRAVENOUS | Status: AC
  Administered 2013-04-09: 1000 mL via INTRAVENOUS

## 2013-04-09 MED ORDER — SODIUM CHLORIDE 0.9 % IV SOLN
1250.0000 mg | INTRAVENOUS | Status: AC
  Administered 2013-04-09: 1250 mg via INTRAVENOUS
  Filled 2013-04-09: qty 1250

## 2013-04-09 MED ORDER — FENTANYL CITRATE 0.05 MG/ML IJ SOLN
100.0000 ug | Freq: Once | INTRAMUSCULAR | Status: AC
  Administered 2013-04-09: 100 ug via INTRAVENOUS
  Filled 2013-04-09: qty 2

## 2013-04-09 MED ORDER — OXYCODONE HCL 5 MG PO TABS
5.0000 mg | ORAL_TABLET | ORAL | Status: DC | PRN
  Administered 2013-04-10 – 2013-04-20 (×13): 5 mg via ORAL
  Filled 2013-04-09 (×14): qty 1

## 2013-04-09 MED ORDER — DEXTROSE 5 % IV SOLN
1.0000 g | Freq: Once | INTRAVENOUS | Status: AC
  Administered 2013-04-09: 1 g via INTRAVENOUS
  Filled 2013-04-09: qty 10

## 2013-04-09 MED ORDER — POTASSIUM CHLORIDE IN NACL 40-0.9 MEQ/L-% IV SOLN
INTRAVENOUS | Status: DC
  Administered 2013-04-09 – 2013-04-10 (×2): via INTRAVENOUS
  Filled 2013-04-09 (×7): qty 1000

## 2013-04-09 MED ORDER — SODIUM CHLORIDE 0.9 % IV SOLN
3.0000 g | Freq: Three times a day (TID) | INTRAVENOUS | Status: DC
  Administered 2013-04-09 – 2013-04-11 (×6): 3 g via INTRAVENOUS
  Filled 2013-04-09 (×9): qty 3

## 2013-04-09 NOTE — ED Notes (Signed)
[facility] Patient was given the orange card application and resources to establish Primary Care and to follow up with chest pain after discharging from the hospital. Patient has my contact information if she has any questions or concerns.

## 2013-04-09 NOTE — Consult Note (Signed)
[facility]    Date of Admission:  04/09/2013  Date of Consult:  04/09/2013  Reason for Consult: Pna/abscess Referring Physician: Dr. Theda Belfast Dhungel   HPI: Monique [name] is an 42 y.o. female. with PMH HTN and former smoker (quit 5-10 yrs ago) who presented to [facility] ED today with 3 day history of fevers, chills, and a nonproductive cough. She reports that yesterday evening she was doing some housework when she noticed a sharp pain over her left chest with radiation across to the right. She has never experienced anything like this before and nothing seems to improve the pain, but movement and touch make the pain worse. She states that prior to Monday of this week she felt in her normal state of health and had attended church on Sunday with no complaints. She has not traveled recently, been around sick contacts, and denies illicit/IVDA and alcohol use. She denies any LOC or decreased awareness recently and no trauma. Patient denies any N/V/D, constipation, HA, changes in vision or urinary sxms.    Past Medical History  Diagnosis Date  . Tonsillitis   . Hypertension     Past Surgical History  Procedure Laterality Date  . Cesarean section    ergies:   No Known Allergies   Medications: I have reviewed patients current medications as documented in Epic Anti-infectives   Start     Dose/Rate Route Frequency Ordered Stop   04/10/13 1400  vancomycin (VANCOCIN) IVPB 1000 mg/200 mL premix     1,000 mg 200 mL/hr over 60 Minutes Intravenous Every 24 hours 04/09/13 1323     04/09/13 2000  Ampicillin-Sulbactam (UNASYN) 3 g in sodium chloride 0.9 % 100 mL IVPB     3 g 100 mL/hr over 60 Minutes Intravenous Every 8 hours 04/09/13 1328     04/09/13 1330  vancomycin (VANCOCIN) 1,250 mg in sodium chloride 0.9 % 250 mL IVPB     1,250 mg 166.7 mL/hr over 90 Minutes Intravenous STAT 04/09/13 1322 04/10/13 1330   04/09/13 1315  clindamycin (CLEOCIN) IVPB 600 mg     600  mg 100 mL/hr over 30 Minutes Intravenous  Once 04/09/13 1302 04/09/13 1359   04/09/13 0900  cefTRIAXone (ROCEPHIN) 1 g in dextrose 5 % 50 mL IVPB     1 g 100 mL/hr over 30 Minutes Intravenous  Once 04/09/13 0850 04/09/13 1042   04/09/13 0900  azithromycin (ZITHROMAX) 500 mg in dextrose 5 % 250 mL IVPB     50 0 mg 250 mL/hr over 60 Minutes Intravenous  Once 04/09/13 0850 04/09/13 1227      Social History:  reports that she has never smoked. She does not have any smokeless tobacco history on file. She reports that she does not drink alcohol or use illicit drugs.  No family history on file.  As in HPI and primary teams notes otherwise 12 point review of systems is negative  Blood pressure 105/72, pulse 137, temperature 100 F (37.8 C), temperature source Oral, resp. rate 25, height 5' 1.02" (1.55 m), weight 68 kg (149 lb 14.6 oz), last menstrual period 03/09/2013, SpO2 99.00%. General: Alert and awake, oriented x3, not in any acute distress. HEENT: anicteric sclera, pupils reactive to light and accommodation, EOMI, oropharynx clear and without exudate CVS regular rate, normal r,  no murmur rubs or gallops Chest: diffuse ronchi Left upper and middle lung fields, coarse breath sounds on the right Abdomen: soft nontender, nondistended, normal bowel sounds Extremities: no  clubbing or edema noted bilaterally Skin: no rashes, dry cracked hands Neuro: nonfocal, strength and sensation intact   Results for orders placed during the hospital encounter of 04/09/13 (from the past 48 hour(s))  CBC WITH DIFFERENTIAL     Status: Abnormal   Collection Time    04/09/13  9:01 AM      Result Value Range   WBC 13.7 (*) 4.0 - 10.5 K/uL   RBC 3.47 (*) 3.87 - 5.11 MIL/uL   Hemoglobin 10.4 (*) 12.0 - 15.0 g/dL   HCT 16.1 (*) 09.6 - 04.5 %   MCV 81.6  78.0 - 100.0 fL   MCH 30.0  26.0 - 34.0 pg   MCHC 36.7 (*) 30.0 - 36.0 g/dL   RDW 40.9  81.1 - 91.4 %   Platelets 190  150 - 400 K/uL   Neutrophils  Relative % 83 (*) 43 - 77 %   Neutro Abs 11.4 (*) 1.7 - 7.7 K/uL   Lymphocytes Relative 8 (*) 12 - 46 %   Lymphs Abs 1.1  0.7 - 4.0 K/uL   Monocytes Relative 9  3 - 12 %   Monocytes Absolute 1.2 (*) 0.1 - 1.0 K/uL   Eosinophils Relative 0  0 - 5 %   Eosinophils Absolute 0.0  0.0 - 0.7 K/uL   Basophils Relative 0  0 - 1 %   Basophils Absolute 0.0  0.0 - 0.1 K/uL  COMPREHENSIVE METABOLIC PANEL     Status: Abnormal   Collection Time    04/09/13  9:01 AM      Result Value Range   Sodium 125 (*) 135 - 145 mEq/L   Potassium 2.6 (*) 3.5 - 5.1 mEq/L   Comment: CRITICAL RESULT CALLED TO, READ BACK BY AND VERIFIED WITH:     S.SLACK,RN 1008 04/09/13 CLARK,S   Chloride 95 (*) 96 - 112 mEq/L   CO2 18 (*) 19 - 32 mEq/L   Glucose, Bld 129 (*) 70 - 99 mg/dL   BUN 17  6 - 23 mg/dL   Creatinine, Ser 7.82 (*) 0.50 - 1.10 mg/dL   Calcium 8.6  8.4 - 95.6 mg/dL   Total Protein 7.6  6.0 - 8.3 g/dL   Albumin 2.2 (*) 3.5 - 5.2 g/dL   AST 38 (*) 0 - 37 U/L   ALT 22  0 - 35 U/L   Alkaline Phosphatase 110  39 - 117 U/L   Total Bilirubin 0.6  0.3 - 1.2 mg/dL   GFR calc non Af Amer 37 (*) >90 mL/min   GFR calc Af Amer 43 (*) >90 mL/min   Comment: (NOTE)     The eGFR has been calculated using the CKD EPI equation.     This calculation has not been validated in all clinical situations.     eGFR's persistently <90 mL/min signify possible Chronic Kidney     Disease.  CG4 I-STAT (LACTIC ACID)     Status: None   Collection Time    04/09/13  9:08 AM      Result Value Range   Lactic Acid, Venous 1.02  0.5 - 2.2 mmol/L  POCT PREGNANCY, URINE     Status: None   Collection Time    04/09/13 10:28 AM      Result Value Range   Preg Test, Ur NEGATIVE  NEGATIVE   Comment:            THE SENSITIVITY OF THIS     METHODOLOGY IS >24 mIU/mL  No results found for this basename: sdes, specrequest, cult, reptstatus   Ct Angio Chest Pe W/cm &/or Wo Cm  04/09/2013   *RADIOLOGY REPORT*  Clinical Data: Left upper chest  pain and shortness of breath. Nonproductive cough and fever.  CT ANGIOGRAPHY CHEST  Technique:  Multidetector CT imaging of the chest using the standard protocol during bolus administration of intravenous contrast. Multiplanar reconstructed images including MIPs were obtained and reviewed to evaluate the vascular anatomy.  Contrast: OMNIPAQUE IOHEXOL 350 MG/ML SOLN  Comparison: Chest radiograph 04/09/2013.  Findings: No pulmonary embolus.  Mediastinal lymph nodes measure up to 9 mm in the low right paratracheal station.  Axillary lymph nodes measure up to 13 mm on the left.  Heart is at the upper limits of normal in size to mildly enlarged.  No pericardial effusion.  Trace left pleural fluid.  Large area of airspace consolidation and surrounding inflammatory change in the left upper lobe with internal low attenuation and air.  Airway remains grossly patent. Minimal scattered peribronchovascular nodularity in the right lung.  Incidental imaging of the upper abdomen shows no acute findings. No worrisome lytic or sclerotic lesions.  IMPRESSION:  1.  Negative for pulmonary embolus. 2.  Consolidative findings in the left upper lobe are most consistent with pneumonia and associated central abscess. Follow-up to clearing is recommended as malignancy cannot be definitively excluded.  These results were called by telephone on 04/09/2013 at 1200 hours to Dr. Rubin Payor, who verbally acknowledged these results. 3.  Mediastinal and axillary lymph nodes may be reactive. 4.  Trace left pleural fluid.   Original Report Authenticated By: Leanna Battles, M.D.   Dg Chest Portable 1 View  04/09/2013   *RADIOLOGY REPORT*  Clinical Data: Chest pain  PORTABLE CHEST - 1 VIEW  Comparison: July 03, 2007  Findings:  There is a large area of consolidation with questionable mass in the left upper lobe.  This area of opacity measures 10.1 x 6.3 cm.  Right lung is clear.  Heart is mildly enlarged with normal pulmonary vascularity.  No  adenopathy.  IMPRESSION: Consolidation with questionable mass left upper lobe.  Advise correlation with contrast enhanced chest CT to further evaluate. Right lung clear.  Mild cardiomegaly.   Original Report Authenticated By: Bretta Bang, M.D.     No results found for this or any previous visit (from the past 720 hour(s)).   Impression/Recommendation Ms. Giamarie Bueche is a 42 yo woman with HTN who presents with fevers/chills, left sided substernal chest pain TTP. Chest CT reveals a pneumonia and LUL abscess. 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  Pneumonia, with abscess: She received azith x1, rocephinx1, clinda x1 in the ER, and is on unasyn and vancomycin --continue vanc and unasyn per pharmacy, appreciate pharmacist assistance in the management of this patient --f/u blood cx --HIV Ab --HCV Ab --Quantiferon gold --sputum culture   Thank you so much for this interesting consult  [facility] [phone] (pager) [phone] (office) 04/09/2013, 2:56 PM  Lewie Chamber 04/09/2013, 2:56 PM

## 2013-04-09 NOTE — Progress Notes (Addendum)
ANTIBIOTIC CONSULT NOTE - INITIAL  Pharmacy Consult:  Vancomycin Indication:  Suspected PNA with lung abscess  No Known Allergies  Patient Measurements: Height: 5' 1.02" (155 cm) Weight: 149 lb 14.6 oz (68 kg) IBW/kg (Calculated) : 47.86  Vital Signs: Temp: 100.5 F (38.1 C) (08/14 1050) Temp src: Oral (08/14 1050) BP: 120/73 mmHg (08/14 1249) Pulse Rate: 98 (08/14 1249)  Labs:  Recent Labs  04/09/13 0901  WBC 13.7*  HGB 10.4*  PLT 190  CREATININE 1.65*   Estimated Creatinine Clearance: 39.2 ml/min (by C-G formula based on Cr of 1.65). No results found for this basename: VANCOTROUGH, VANCOPEAK, VANCORANDOM, GENTTROUGH, GENTPEAK, GENTRANDOM, TOBRATROUGH, TOBRAPEAK, TOBRARND, AMIKACINPEAK, AMIKACINTROU, AMIKACIN,  in the last 72 hours   Microbiology: No results found for this or any previous visit (from the past 720 hour(s)).  Medical History: Past Medical History  Diagnosis Date  . Tonsillitis   . Hypertension      Assessment: 24 YOF admitted with complaints of fever with chills for 3 days.  Chest CT reveals PNA with central abscess and pharmacy consulted to manage vancomycin.  Noted patient with AKI and has received first doses of Rocephin and azithromycin; clindamycin 600mg  IV x 1 dose ordered.   Goal of Therapy:  Vancomycin trough level 15-20 mcg/ml   Plan:  - Vanc 1250mg  IV x 1, then 1gm IV Q24H - F/U with continuation of GN coverage - Monitor renal fxn, clinical course, vanc trough as indicated     Sohail Capraro D. Laney Potash, PharmD, BCPS Pager:  [phone] 04/09/2013, 1:23 PM     ==============================  Addendum: Add Unasyn for Gram negative coverage.   Plan: - Unasyn 3gm IV Q8H - Monitor renal fxn and clinical course     Layla Kesling D. Laney Potash, PharmD, BCPS Pager:  [phone] 04/09/2013, 1:26 PM

## 2013-04-09 NOTE — ED Provider Notes (Signed)
CSN: [id number]     Arrival date & time 04/09/13  1191 History     First MD Initiated Contact with Patient 04/09/13 [phone]     Chief Complaint  Patient presents with  . Chest Pain   (Consider location/radiation/quality/duration/timing/severity/associated sxs/prior Treatment) HPI 42 year old female with history of hypertension presents with left-sided chest pain, cough, subjective fevers at home. Patient reports that nonproductive cough, chest pain, shortness of breath began last night her in 10 PM. Chest pain is left-sided without radiation, worse with cough and deep breaths, stabbing in nature and rated 10 out of 10 in severity.  Pleuritic but not positional. Reports she feels that she has a fever, however has not taken her temperature at home.  Patient to 325 mg chewable aspirin at home x2 prior to EMS arrival.  Patient denies any history of DVT/PE, family history of DVT/PE, hemoptysis, leg pain or swelling, estrogen use, no history of cancer, recent surgeries or immobilization. Next history of hypertension, but denies history of smoking, diabetes, hyperlipidemia and does note family history "heart disease" and her father in his 53s. Has not been hospitalized in the last 3 months and resides at home.  Past Medical History  Diagnosis Date  . Tonsillitis   . Hypertension    Past Surgical History  Procedure Laterality Date  . Cesarean section     No family history on file. History  Substance Use Topics  . Smoking status: Never Smoker   . Smokeless tobacco: Not on file  . Alcohol Use: No   OB History   Grav Para Term Preterm Abortions TAB SAB Ect Mult Living                 Review of Systems  Constitutional: Positive for fever and chills.  HENT: Negative for sore throat and neck pain.   Eyes: Negative for visual disturbance.  Respiratory: Positive for cough and shortness of breath.   Cardiovascular: Positive for chest pain. Negative for leg swelling.  Gastrointestinal: Positive  for nausea. Negative for vomiting, abdominal pain and diarrhea.  Genitourinary: Negative for difficulty urinating.  Musculoskeletal: Negative for back pain.  Skin: Negative for rash.  Neurological: Negative for syncope and headaches.    Allergies  Review of patient's allergies indicates no known allergies.  Home Medications   No current outpatient prescriptions on file. BP 128/85  Pulse 123  Temp(Src) 102.4 F (39.1 C) (Oral)  Resp 32  Ht 5' 1.02" (1.55 m)  Wt 149 lb 14.6 oz (68 kg)  BMI 28.3 kg/m2  SpO2 95%  LMP 03/09/2013 Physical Exam  Nursing note and vitals reviewed. Constitutional: She is oriented to person, place, and time. She appears well-developed and well-nourished. She appears ill. No distress.  HENT:  Head: Normocephalic and atraumatic.  Mouth/Throat: Oropharynx is clear and moist. No oropharyngeal exudate.  Eyes: Conjunctivae and EOM are normal.  Neck: Normal range of motion.  Cardiovascular: Normal rate, regular rhythm, normal heart sounds and intact distal pulses.  Exam reveals no gallop and no friction rub.   No murmur heard. Pulmonary/Chest: Effort normal and breath sounds normal. No respiratory distress. She has no wheezes. She has no rales. She exhibits tenderness.  Abdominal: Soft. She exhibits no distension. There is no tenderness. There is no guarding.  Musculoskeletal: She exhibits no edema and no tenderness.  Neurological: She is alert and oriented to person, place, and time.  Skin: Skin is warm and dry. No rash noted. She is not diaphoretic. No erythema.  ED Course   Procedures (including critical care time)  Date: 04/09/2013  Rate: 129  Rhythm: sinus tachycardia  QRS Axis: normal  Intervals: normal  ST/T Wave abnormalities: nonspecific ST changes  Conduction Disutrbances:none  Narrative Interpretation:   Old EKG Reviewed: none available   Labs Reviewed  CBC WITH DIFFERENTIAL - Abnormal; Notable for the following:    WBC 13.7 (*)     RBC 3.47 (*)    Hemoglobin 10.4 (*)    HCT 28.3 (*)    MCHC 36.7 (*)    Neutrophils Relative % 83 (*)    Neutro Abs 11.4 (*)    Lymphocytes Relative 8 (*)    Monocytes Absolute 1.2 (*)    All other components within normal limits  COMPREHENSIVE METABOLIC PANEL - Abnormal; Notable for the following:    Sodium 125 (*)    Potassium 2.6 (*)    Chloride 95 (*)    CO2 18 (*)    Glucose, Bld 129 (*)    Creatinine, Ser 1.65 (*)    Albumin 2.2 (*)    AST 38 (*)    GFR calc non Af Amer 37 (*)    GFR calc Af Amer 43 (*)    All other components within normal limits  CBC - Abnormal; Notable for the following:    WBC 15.3 (*)    RBC 3.59 (*)    Hemoglobin 10.8 (*)    HCT 29.5 (*)    MCHC 36.6 (*)    All other components within normal limits  MRSA PCR SCREENING  CULTURE, BLOOD (ROUTINE X 2)  CULTURE, BLOOD (ROUTINE X 2)  CULTURE, EXPECTORATED SPUTUM-ASSESSMENT  MAGNESIUM  HIV ANTIBODY (ROUTINE TESTING)  BASIC METABOLIC PANEL  QUANTIFERON TB GOLD ASSAY  BASIC METABOLIC PANEL  CBC  HIV ANTIBODY (ROUTINE TESTING)  HIV 1 RNA QUANT-NO REFLEX-BLD  HEPATITIS C ANTIBODY (REFLEX)  CG4 I-STAT (LACTIC ACID)  POCT PREGNANCY, URINE   Ct Angio Chest Pe W/cm &/or Wo Cm  04/09/2013   *RADIOLOGY REPORT*  Clinical Data: Left upper chest pain and shortness of breath. Nonproductive cough and fever.  CT ANGIOGRAPHY CHEST  Technique:  Multidetector CT imaging of the chest using the standard protocol during bolus administration of intravenous contrast. Multiplanar reconstructed images including MIPs were obtained and reviewed to evaluate the vascular anatomy.  Contrast: OMNIPAQUE IOHEXOL 350 MG/ML SOLN  Comparison: Chest radiograph 04/09/2013.  Findings: No pulmonary embolus.  Mediastinal lymph nodes measure up to 9 mm in the low right paratracheal station.  Axillary lymph nodes measure up to 13 mm on the left.  Heart is at the upper limits of normal in size to mildly enlarged.  No pericardial effusion.   Trace left pleural fluid.  Large area of airspace consolidation and surrounding inflammatory change in the left upper lobe with internal low attenuation and air.  Airway remains grossly patent. Minimal scattered peribronchovascular nodularity in the right lung.  Incidental imaging of the upper abdomen shows no acute findings. No worrisome lytic or sclerotic lesions.  IMPRESSION:  1.  Negative for pulmonary embolus. 2.  Consolidative findings in the left upper lobe are most consistent with pneumonia and associated central abscess. Follow-up to clearing is recommended as malignancy cannot be definitively excluded.  These results were called by telephone on 04/09/2013 at 1200 hours to Dr. Rubin Payor, who verbally acknowledged these results. 3.  Mediastinal and axillary lymph nodes may be reactive. 4.  Trace left pleural fluid.   Original Report Authenticated By: Juliette Alcide  Fredirick Lathe, M.D.   Dg Chest Portable 1 View  04/09/2013   *RADIOLOGY REPORT*  Clinical Data: Chest pain  PORTABLE CHEST - 1 VIEW  Comparison: July 03, 2007  Findings:  There is a large area of consolidation with questionable mass in the left upper lobe.  This area of opacity measures 10.1 x 6.3 cm.  Right lung is clear.  Heart is mildly enlarged with normal pulmonary vascularity.  No adenopathy.  IMPRESSION: Consolidation with questionable mass left upper lobe.  Advise correlation with contrast enhanced chest CT to further evaluate. Right lung clear.  Mild cardiomegaly.   Original Report Authenticated By: Bretta Bang, M.D.   Date: 04/09/2013  Rate: 99  Rhythm: normal sinus rhythm  QRS Axis: normal  Intervals: normal  ST/T Wave abnormalities: normal  Conduction Disutrbances:none  Narrative Interpretation:   Old EKG Reviewed: slower than previous   Date: 04/09/2013  Rate: 129  Rhythm: sinus tachycardia  QRS Axis: normal  Intervals: normal  ST/T Wave abnormalities: nonspecific ST changes, likely secondary to rate  Conduction  Disutrbances:none  Narrative Interpretation:   Old EKG Reviewed: none available, ST normalized      1. Pneumonia with lung abscess   2. SIRS (systemic inflammatory response syndrome)     MDM  42 year old female with history of hypertension presents with left-sided chest pain, cough, subjective fevers at home.  Differential diagnosis includes pneumonia, PE, and less likely ACS, pneumothorax, aortic dissection. EKG evaluate evaluated by me and shows sinus tachycardia, without acute ST changes to suggest pericarditis or ischemia.  Patient febrile to 102.8, tachycardic to 124 with a respiratory rate of 23 and normotensive with normal oxygen 6 saturation on arrival to the emergency department.  Given fever, cough, pleuritic chest pain have high suspicion for pneumonia and antibiotics for community acquired pneumonia were ordered. Patient given 1 g of ceftriaxone and 500 of azithromycin. Chest x-ray was done to evaluate for signs of dissection, pneumothorax, heart failure or pneumonia and showed consolidation and questionable LUL mass.  Given acute onset of symptoms and recommendation for further evaluation of the mass by CT with contrast, CT angio chest PE study was done which was negative for pulmonary mass, and showed likely pneumonia with associated central abscess. Radiology does recommend followup imaging to make sure that area clears given malignancy cannot be definitively excluded. Given findings of abscess, vancomycin and clindamycin IV were given.  Labs significant for leukocytosis of 13.7, hyponatremia to 125, hypokalemia and 2.6 and bicarbonate of 18. Lactic acid is 1.02. Troponin ordered and completed however not resulted in system-however given pneumonia cardiac etiology chest pain unlikely.  Patient was given oral potassium, and lactated Ringer's for continued resuscitation and potassium replacement.  Patient's creatinine 1.65 (GFR 43) with last measured creatinine 0.7 consistent with AKI likely  secondary to infection. Patient did receive contrast for CT scan and was aggressively fluid resuscitated with 4 L of normal saline and LR in the emergency department.  Patient to be admitted to step down unit by hospitalist.  Patient with BP 112/76, RR 31, tachy 107 with normal O2 on room air.     Rhae Lerner, MD 04/09/13 1759

## 2013-04-09 NOTE — H&P (Signed)
[facility] History and Physical  ESME FREUND ZOX:[id number] DOB: [birth year] DOA: 04/09/2013  Referring physician:ED PCP: Default, Provider, MD   Chief Complaint:  Fever with chills for 3 days  HPI:  42 y/o female with hx of HTN, ex smoker presented to ED with subjective fever with chills for last 3 days. She also had nonproductive cough. Patient reports poor appetite for past one week. Last evening she was doing some household work when she had a sharp pain over her left chest radiating across. She further had subjective fever with chills and sweating. She does report of some shortness of breath with exertion but denies orthopnea or PND. She denies any nausea, vomiting, headache, blurred vision, dizziness, palpitations, abdominal pain, diarrhea or urinary symptoms. Patient denies being on any medications currently. She has quit smoking for several months now. Denies any recent travel or sick contacts. Denies history of STDs or multiple sexual partners. She denies use of alcohol. In the ED patient was noted to be tachycardic to 120s, respiratory rate of 25, fever 102.33F and O2 sat of 98% on room. Blood work done showed a white count of 13.7 K., hemoglobin of 10.4, sodium of 125, potassium of 2.6, chloride of 95, bicarbonate of 18 and creatinine of 1.65. A portable chest x-ray done that showed dilatation with questionable mass over left upper lobe which was followed by a CT angiogram of the chest which again showed findings of consolidation in the left upper lobe consistent with pneumonia and also CT central abscess.Study was negative for PE. Patient was given 3 L of normal saline bolus, IV Rocephin and azithromycin followed by one dose of IV clindamycin and [facility] called for admission to step down.   Review of Systems:  Constitutional: Positive for fever, chills, diaphoresis, appetite change and fatigue.  HEENT: Denies photophobia, eye pain, redness, hearing loss, ear  pain, congestion, sore throat, rhinorrhea, sneezing, mouth sores, trouble swallowing, neck pain, neck stiffness and tinnitus.   Respiratory: Dry cough with dyspnea on exertion, chest tightness. Denies  wheezing.   Cardiovascular: Left-sided chest pain. Denies palpitations and leg swelling.  Gastrointestinal: Denies nausea, vomiting, abdominal pain, diarrhea, constipation, blood in stool and abdominal distention.  Genitourinary: Denies dysuria, urgency, frequency, hematuria, flank pain and difficulty urinating.  Musculoskeletal: Denies myalgias, back pain, joint swelling, arthralgias and gait problem.  Skin: Denies pallor, rash and wound.  Neurological: Denies dizziness, seizures, syncope, weakness, light-headedness, numbness and headaches.  Hematological: Denies adenopathy.  Psychiatric/Behavioral: Denies  mood changes, confusion, nervousness, sleep disturbance and agitation   Past Medical History  Diagnosis Date  . Tonsillitis   . Hypertension    Past Surgical History  Procedure Laterality Date  . Cesarean section     Social History:  reports that she has never smoked. She does not have any smokeless tobacco history on file. She reports that she does not drink alcohol or use illicit drugs.  No Known Allergies  No family history on file.  Prior to Admission medications   Medication Sig Start Date End Date Taking? Authorizing Provider  aspirin EC 325 MG tablet Take 650 mg by mouth once as needed (for chest pain).   Yes Historical Provider, MD  PRESCRIPTION MEDICATION Take 0.5 tablets by mouth 2 (two) times daily. *Blood Pressure medication, says shes had 2 1/4 tablets this morning.   Yes Historical Provider, MD    Physical Exam:  Filed Vitals:   04/09/13 1000 04/09/13 1050 04/09/13 1215 04/09/13 1249  BP: 100/42 102/56 103/51  120/73  Pulse: 111 102 110 98  Temp:  100.5 F (38.1 C)    TempSrc:  Oral    Resp: 25 12 21 24   SpO2: 97% 96% 98% 96%    Constitutional: Vital signs  reviewed. And is a middle aged female lying in bed  appears fatigued. HEENT: No pallor, dry oral mucosa, multiple dental cavies Cardiovascular: S1-S2 tachycardic. no MRG, pulses symmetric and intact bilaterally Pulmonary/Chest: Diminished breath sounds over left lung with coarse crackles, or wheezes or rhonchi Abdominal: Soft. Non-tender, non-distended, bowel sounds are normal, no masses, organomegaly, or guarding present.  GU: no CVA tenderness Musculoskeletal: No joint deformities, erythema, or stiffness, ROM full and no nontender Ext: no edema and no cyanosis, pulses palpable bilaterally  Hematology: no cervical, adenopathy.  Neurological: A&O x3, nonfocal  Labs on Admission:  Basic Metabolic Panel:  Recent Labs Lab 04/09/13 0901  NA 125*  K 2.6*  CL 95*  CO2 18*  GLUCOSE 129*  BUN 17  CREATININE 1.65*  CALCIUM 8.6   Liver Function Tests:  Recent Labs Lab 04/09/13 0901  AST 38*  ALT 22  ALKPHOS 110  BILITOT 0.6  PROT 7.6  ALBUMIN 2.2*   No results found for this basename: LIPASE, AMYLASE,  in the last 168 hours No results found for this basename: AMMONIA,  in the last 168 hours CBC:  Recent Labs Lab 04/09/13 0901  WBC 13.7*  NEUTROABS 11.4*  HGB 10.4*  HCT 28.3*  MCV 81.6  PLT 190   Cardiac Enzymes: No results found for this basename: CKTOTAL, CKMB, CKMBINDEX, TROPONINI,  in the last 168 hours BNP: No components found with this basename: POCBNP,  CBG: No results found for this basename: GLUCAP,  in the last 168 hours  Radiological Exams on Admission: Ct Angio Chest Pe W/cm &/or Wo Cm  04/09/2013   *RADIOLOGY REPORT*  Clinical Data: Left upper chest pain and shortness of breath. Nonproductive cough and fever.  CT ANGIOGRAPHY CHEST  Technique:  Multidetector CT imaging of the chest using the standard protocol during bolus administration of intravenous contrast. Multiplanar reconstructed images including MIPs were obtained and reviewed to evaluate the  vascular anatomy.  Contrast: OMNIPAQUE IOHEXOL 350 MG/ML SOLN  Comparison: Chest radiograph 04/09/2013.  Findings: No pulmonary embolus.  Mediastinal lymph nodes measure up to 9 mm in the low right paratracheal station.  Axillary lymph nodes measure up to 13 mm on the left.  Heart is at the upper limits of normal in size to mildly enlarged.  No pericardial effusion.  Trace left pleural fluid.  Large area of airspace consolidation and surrounding inflammatory change in the left upper lobe with internal low attenuation and air.  Airway remains grossly patent. Minimal scattered peribronchovascular nodularity in the right lung.  Incidental imaging of the upper abdomen shows no acute findings. No worrisome lytic or sclerotic lesions.  IMPRESSION:  1.  Negative for pulmonary embolus. 2.  Consolidative findings in the left upper lobe are most consistent with pneumonia and associated central abscess. Follow-up to clearing is recommended as malignancy cannot be definitively excluded.  These results were called by telephone on 04/09/2013 at 1200 hours to Dr. Rubin Payor, who verbally acknowledged these results. 3.  Mediastinal and axillary lymph nodes may be reactive. 4.  Trace left pleural fluid.   Original Report Authenticated By: Leanna Battles, M.D.   Dg Chest Portable 1 View  04/09/2013   *RADIOLOGY REPORT*  Clinical Data: Chest pain  PORTABLE CHEST - 1 VIEW  Comparison: July 03, 2007  Findings:  There is a large area of consolidation with questionable mass in the left upper lobe.  This area of opacity measures 10.1 x 6.3 cm.  Right lung is clear.  Heart is mildly enlarged with normal pulmonary vascularity.  No adenopathy.  IMPRESSION: Consolidation with questionable mass left upper lobe.  Advise correlation with contrast enhanced chest CT to further evaluate. Right lung clear.  Mild cardiomegaly.   Original Report Authenticated By: Bretta Bang, M.D.    EKG :pending  Assessment/Plan Active  Problems:   SIRS secondary to  left upper lobe Pneumonia with abscess Admit to stepdown  given rocephin , azithro and clindamycin in ED. Will broaden coverage with IV vanco and unasyn . ID and pulmonary consulted. sats maintained on RA. Follow blood cx. Check HIV ab. Tylenol prn for fever  patient remains tachycardic . given 3 L NS bolus. continue NS @125  CC/ hr  replenish low kcl  Hyponatremia/ hypokalemia replenshing with IV NS and kcl  check mg Repeat lytes in evening. Follow low bicarb  Acute kidney injury  no baseline noted. liekly secondary to dehydration. patient reports taking motrin at home for fever.   Hypertension BP stable     DVT prophylaxis: Subcutaneous Lovenox    Code Status: Full code Family Communication: None at bedside Disposition Plan: Home once stable  Ali Mclaurin [facility] Pager [phone]  Total time spent on admission: 70 minutes  If 7PM-7AM, please contact night-coverage [email or web address] Password [email or web address] 04/09/2013, 1:01 PM

## 2013-04-09 NOTE — Consult Note (Addendum)
PULMONARY  / CRITICAL CARE MEDICINE  Name: Monique [name] MRN: [id number] DOB: 04-Jan-1971    ADMISSION DATE:  04/09/2013 CONSULTATION DATE:  04/09/13   REFERRING MD :  ED MD PRIMARY SERVICE:  TRH  CHIEF COMPLAINT:  Chest pain x 1 day  BRIEF PATIENT DESCRIPTION:  42 y/o F with hx of HTN and former smoker presents to the [facility] ED with complaints of chest pain x 1 day. PCCM was called for consult for abnormal CT.  SIGNIFICANT EVENTS / STUDIES:  8/14>>>ED 8/14 CT Chest>>> Consolidative findings in the left upper lobe are most  consistent with pneumonia and associated central abscess.   LINES / TUBES: PIV  CULTURES: 8/14 UC>>>  ANTIBIOTICS: 8/14 vanc>>> 8/14 rocephin>>> 8/14 zithromax>>>  HISTORY OF PRESENT ILLNESS: 42 y/o F with hx of HTN and former smoker presents to the [facility] ED with complaints of chest pain x 1 day. Last night while doing house work, she began experiencing a sharp L-sided chest pain that radiated to mid-sternal region. She has had fever and chills x 3 days with nonproductive cough. Admits to exertional SOB. Denies orthopnea or PND, N/V, HA, blurred vision, exposure to TB. She admits to smoking 1-3 cigarettes daily for 7 years. She was tested for TB years ago and said it was negative. Denies any recent travel and states she rarely drinks alcohol. Surgical hx consistent with 3 C/S and FH not significant.   PAST MEDICAL HISTORY :  Past Medical History  Diagnosis Date  . Tonsillitis   . Hypertension    Past Surgical History  Procedure Laterality Date  . Cesarean section     Prior to Admission medications   Medication Sig Start Date End Date Taking? Authorizing Provider  aspirin EC 325 MG tablet Take 650 mg by mouth once as needed (for chest pain).   Yes Historical Provider, MD  ibuprofen (ADVIL,MOTRIN) 200 MG tablet Take 200 mg by mouth every 6 (six) hours as needed for pain.   Yes Historical Provider, MD  PRESCRIPTION MEDICATION Take 0.5 tablets by mouth 2 (two)  times daily. *Blood Pressure medication, says shes had 2 1/4 tablets this morning.   Yes Historical Provider, MD   No Known Allergies  FAMILY HISTORY:  No family history on file. SOCIAL HISTORY:  reports that she has never smoked. She does not have any smokeless tobacco history on file. She reports that she does not drink alcohol or use illicit drugs.  REVIEW OF SYSTEMS:   Constitutional: Positive for fever and chills. Negative weight loss, malaise/fatigue and diaphoresis.  HENT: Negative for hearing loss, ear pain, nosebleeds, congestion, sore throat, neck pain, tinnitus and ear discharge.   Eyes: Negative for blurred vision, double vision, photophobia, pain, discharge and redness.  Respiratory: Positive for pleuritic CP. Negative for cough, hemoptysis, sputum production, shortness of breath, wheezing and stridor, recent travel.   Cardiovascular: Positive for CP. Negative for palpitations, orthopnea, claudication, leg swelling and PND.  Gastrointestinal: Positive for decrease in appetite. Negative for heartburn, nausea, vomiting, abdominal pain, diarrhea, constipation, blood in stool and melena.  Genitourinary: Negative for dysuria, urgency, frequency, hematuria and flank pain.  Musculoskeletal: Negative for myalgias, back pain, joint pain and falls.  Skin: Negative for itching and rash.  Neurological: Negative for dizziness, tingling, tremors, sensory change, speech change, focal weakness, seizures, loss of consciousness, weakness and headaches.  Endo/Heme/Allergies: Negative for environmental allergies and polydipsia. Does not bruise/bleed easily.  SUBJECTIVE: Patient lying in ED bed. In NAD.  VITAL SIGNS: Temp:  [  100 F (37.8 C)-102.8 F (39.3 C)] 100 F (37.8 C) (08/14 1332) Pulse Rate:  [98-137] 137 (08/14 1416) Resp:  [12-31] 25 (08/14 1416) BP: (99-139)/(42-81) 105/72 mmHg (08/14 1416) SpO2:  [96 %-100 %] 99 % (08/14 1416) Weight:  [149 lb 14.6 oz (68 kg)] 149 lb 14.6 oz (68  kg) (08/14 1300)  PHYSICAL EXAMINATION: General:  WDWN female, in NAD Neuro:  No focal neuro deficits Oral: Very poor dentition on posterior aspects of lower jaw teeth bilaterally. HEENT:  PERRL Cardiovascular:  RRR, no m/r/g Lungs: rhonchi heard in LUL area, R lung CTA, resp's even bil Abdomen:  BS+, no masses, nontender, ND Musculoskeletal:  No gross deformities noted Skin:  Intact   Recent Labs Lab 04/09/13 0901  NA 125*  K 2.6*  CL 95*  CO2 18*  BUN 17  CREATININE 1.65*  GLUCOSE 129*   Recent Labs Lab 04/09/13 0901  HGB 10.4*  HCT 28.3*  WBC 13.7*  PLT 190   Ct Angio Chest Pe W/cm &/or Wo Cm  04/09/2013   *RADIOLOGY REPORT*  Clinical Data: Left upper chest pain and shortness of breath. Nonproductive cough and fever.  CT ANGIOGRAPHY CHEST  Technique:  Multidetector CT imaging of the chest using the standard protocol during bolus administration of intravenous contrast. Multiplanar reconstructed images including MIPs were obtained and reviewed to evaluate the vascular anatomy.  Contrast: OMNIPAQUE IOHEXOL 350 MG/ML SOLN  Comparison: Chest radiograph 04/09/2013.  Findings: No pulmonary embolus.  Mediastinal lymph nodes measure up to 9 mm in the low right paratracheal station.  Axillary lymph nodes measure up to 13 mm on the left.  Heart is at the upper limits of normal in size to mildly enlarged.  No pericardial effusion.  Trace left pleural fluid.  Large area of airspace consolidation and surrounding inflammatory change in the left upper lobe with internal low attenuation and air.  Airway remains grossly patent. Minimal scattered peribronchovascular nodularity in the right lung.  Incidental imaging of the upper abdomen shows no acute findings. No worrisome lytic or sclerotic lesions.  IMPRESSION:  1.  Negative for pulmonary embolus. 2.  Consolidative findings in the left upper lobe are most consistent with pneumonia and associated central abscess. Follow-up to clearing is  recommended as malignancy cannot be definitively excluded.  These results were called by telephone on 04/09/2013 at 1200 hours to Dr. Rubin Payor, who verbally acknowledged these results. 3.  Mediastinal and axillary lymph nodes may be reactive. 4.  Trace left pleural fluid.   Original Report Authenticated By: Leanna Battles, M.D.   Dg Chest Portable 1 View  04/09/2013   *RADIOLOGY REPORT*  Clinical Data: Chest pain  PORTABLE CHEST - 1 VIEW  Comparison: July 03, 2007  Findings:  There is a large area of consolidation with questionable mass in the left upper lobe.  This area of opacity measures 10.1 x 6.3 cm.  Right lung is clear.  Heart is mildly enlarged with normal pulmonary vascularity.  No adenopathy.  IMPRESSION: Consolidation with questionable mass left upper lobe.  Advise correlation with contrast enhanced chest CT to further evaluate. Right lung clear.  Mild cardiomegaly.   Original Report Authenticated By: Bretta Bang, M.D.    ASSESSMENT / PLAN:  A: LUL pulmonary abscess likely due to poor dentition vs. ? TB exposure Poor dentition with multiple caries.  P:  - Inpatient and outpatient abx therapy until dental cavities treated - Referral to dentist for treatment of dental cavities as I suspect that is  the cause of her pulmonary abscess. - Quantiferon gold to r/o TB, would not recommend isolation. - Repeat imaging in 4-6 weeks to determine if abx therapy treated lung abscess, will need abx til cavity closure. - Will use bronchoscopy only if patient decompensates or fails to improve. - Antibiotics per primary, would recommend addition of anaerobic coverage given poor dentition.  Will continue to follow with you.  Alyson Reedy, M.D. Pulmonary and Critical Care Medicine [facility] Pager: [phone]  04/09/2013, 2:45 PM

## 2013-04-09 NOTE — ED Notes (Signed)
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 Last night at 10 pm developed substernal CP unsure if she may have pulled a muscle while working. Today substernal cp worse with movement and when moving left arm is tender to touch. Lung sounds Rhonchi pt is warm to touch reports feels like she has a fever but has not taken her temp. Took 2-325 aspirin. States is nauseated. BP 106/72, HR 125, 95% RA. 20 L. AC.

## 2013-04-09 NOTE — Consult Note (Signed)
INFECTIOUS DISEASE ATTENDING ADDENDUM:     [facility]   Date: 04/09/2013  Patient name: Monique [name]  Medical record number: [id number]  Date of birth: Mar 05, 1971    This patient has been seen and discussed with the house staff. Please see their note for complete details. I concur with their findings with the following additions/corrections:  42 year old African American lady with hypertension and prior chronic smoking, COPD poor dentition who had acute onset in the last 3 days of fevers chills and cough found now to have apparent lung abscess it is quite large and involving the left upper lobe. On exam she hasrhonchorous sounds throughout the lung poor dentition  She has not been incarcerated and has never had contact with anyone known to have tuberculosis. She has lived largely in [place] having also visited other [address] such as [place] and [place].  I would agree with vancomycin and ampicillin sulbactam for now.   I would Like sputum for bacterial cultures  I WOULD NOT GET QF GOLD UNLESS WE ARE GOING TO PURSUE WORKUP FOR TB WITH AIRBORNE ISOLATION, INDUCED SPUTUM AND BRONCHOSCOPY. At this point  suspicion for TB has low so will not pursue this based on data available at this time   I will check HIV ab, RNA, hep c  She will need protracted abx, if cultures are not informative I will likely drop her anti-MRSA coverage and see how she does on unasyn alone.   [name] 04/09/2013, 5:29 PM

## 2013-04-09 NOTE — ED Notes (Signed)
Pt calm, talking on phone with family. Reports pain has improved

## 2013-04-10 DIAGNOSIS — J9601 Acute respiratory failure with hypoxia: Secondary | ICD-10-CM | POA: Diagnosis present

## 2013-04-10 DIAGNOSIS — J9600 Acute respiratory failure, unspecified whether with hypoxia or hypercapnia: Secondary | ICD-10-CM

## 2013-04-10 DIAGNOSIS — Z21 Asymptomatic human immunodeficiency virus [HIV] infection status: Secondary | ICD-10-CM

## 2013-04-10 DIAGNOSIS — B20 Human immunodeficiency virus [HIV] disease: Principal | ICD-10-CM

## 2013-04-10 DIAGNOSIS — N179 Acute kidney failure, unspecified: Secondary | ICD-10-CM

## 2013-04-10 LAB — EXPECTORATED SPUTUM ASSESSMENT W GRAM STAIN, RFLX TO RESP C: Special Requests: NORMAL

## 2013-04-10 LAB — CBC
HCT: 26.9 % — ABNORMAL LOW (ref 36.0–46.0)
Hemoglobin: 9.5 g/dL — ABNORMAL LOW (ref 12.0–15.0)
MCH: 29.1 pg (ref 26.0–34.0)
MCHC: 35.3 g/dL (ref 30.0–36.0)
MCV: 82.3 fL (ref 78.0–100.0)

## 2013-04-10 LAB — BASIC METABOLIC PANEL
BUN: 18 mg/dL (ref 6–23)
CO2: 16 mEq/L — ABNORMAL LOW (ref 19–32)
Chloride: 105 mEq/L (ref 96–112)
Glucose, Bld: 100 mg/dL — ABNORMAL HIGH (ref 70–99)
Potassium: 4 mEq/L (ref 3.5–5.1)

## 2013-04-10 MED ORDER — SODIUM CHLORIDE 3 % IN NEBU
5.0000 mL | INHALATION_SOLUTION | Freq: Once | RESPIRATORY_TRACT | Status: AC
  Administered 2013-04-10: 5 mL via RESPIRATORY_TRACT
  Filled 2013-04-10: qty 8

## 2013-04-10 MED ORDER — PNEUMOCOCCAL VAC POLYVALENT 25 MCG/0.5ML IJ INJ
0.5000 mL | INJECTION | INTRAMUSCULAR | Status: AC
  Filled 2013-04-10: qty 0.5

## 2013-04-10 MED ORDER — SODIUM CHLORIDE 0.9 % IV BOLUS (SEPSIS)
500.0000 mL | Freq: Once | INTRAVENOUS | Status: AC
  Administered 2013-04-10: 500 mL via INTRAVENOUS

## 2013-04-10 MED ORDER — POTASSIUM CHLORIDE CRYS ER 20 MEQ PO TBCR
40.0000 meq | EXTENDED_RELEASE_TABLET | Freq: Once | ORAL | Status: AC
  Administered 2013-04-10: 40 meq via ORAL
  Filled 2013-04-10: qty 2

## 2013-04-10 MED ORDER — SODIUM CHLORIDE 3 % IN NEBU
5.0000 mL | INHALATION_SOLUTION | Freq: Once | RESPIRATORY_TRACT | Status: AC | PRN
  Filled 2013-04-10: qty 8

## 2013-04-10 MED ORDER — IBUPROFEN 800 MG PO TABS
800.0000 mg | ORAL_TABLET | Freq: Once | ORAL | Status: AC
  Administered 2013-04-10: 800 mg via ORAL
  Filled 2013-04-10: qty 1

## 2013-04-10 MED ORDER — DEXTROMETHORPHAN POLISTIREX 30 MG/5ML PO LQCR
30.0000 mg | Freq: Two times a day (BID) | ORAL | Status: DC
  Administered 2013-04-10 – 2013-04-22 (×25): 30 mg via ORAL
  Filled 2013-04-10 (×26): qty 5

## 2013-04-10 MED ORDER — SODIUM CHLORIDE 0.9 % IV SOLN: INTRAVENOUS | Status: DC

## 2013-04-10 MED ORDER — BENZONATATE 100 MG PO CAPS
100.0000 mg | ORAL_CAPSULE | Freq: Three times a day (TID) | ORAL | Status: DC
  Administered 2013-04-10 – 2013-04-22 (×36): 100 mg via ORAL
  Filled 2013-04-10 (×42): qty 1

## 2013-04-10 NOTE — Progress Notes (Signed)
See my separate note.

## 2013-04-10 NOTE — Progress Notes (Signed)
PULMONARY  / CRITICAL CARE MEDICINE  Name: Monique [name] MRN: [id number] DOB: [birth year]    ADMISSION DATE:  04/09/2013 CONSULTATION DATE:  04/09/13   REFERRING MD :  ED MD PRIMARY SERVICE:  TRH  CHIEF COMPLAINT:  Chest pain x 1 day  BRIEF PATIENT DESCRIPTION:  42 y/o F with hx of HTN and former smoker presents to the [facility] ED with complaints of chest pain x 1 day. PCCM was called for consult for abnormal CT.  SIGNIFICANT EVENTS / STUDIES:  8/14>>>ED 8/14 CT Chest>>> Consolidative findings in the left upper lobe are most  consistent with pneumonia and associated central abscess.   LINES / TUBES: PIV  CULTURES: 8/14 UC>>> 8/14 HIV positive.  ANTIBIOTICS: 8/14 vanc>>> 8/14 rocephin>>> 8/14 zithromax>>>  SUBJECTIVE: Patient lying in ED bed. In NAD.  VITAL SIGNS: Temp:  [98.2 F (36.8 C)-105.2 F (40.7 C)] 98.2 F (36.8 C) (08/15 0829) Pulse Rate:  [98-197] 104 (08/15 0829) Resp:  [14-35] 35 (08/15 0829) BP: (75-165)/(38-137) 128/88 mmHg (08/15 0829) SpO2:  [92 %-100 %] 97 % (08/15 0829) Weight:  [68 kg (149 lb 14.6 oz)] 68 kg (149 lb 14.6 oz) (08/14 1300)  PHYSICAL EXAMINATION: General:  WDWN female, in NAD Neuro:  No focal neuro deficits Oral: Very poor dentition on posterior aspects of lower jaw teeth bilaterally. HEENT:  PERRL Cardiovascular:  RRR, no m/r/g Lungs: rhonchi heard in LUL area, R lung CTA, resp's even bil Abdomen:  BS+, no masses, nontender, ND Musculoskeletal:  No gross deformities noted Skin:  Intact   Recent Labs Lab 04/09/13 0901 04/09/13 1530 04/10/13 0535  NA 125* 128* 132*  K 2.6* 3.6 4.0  CL 95* 98 105  CO2 18* 18* 16*  BUN 17 15 18   CREATININE 1.65* 1.48* 1.45*  GLUCOSE 129* 93 100*    Recent Labs Lab 04/09/13 0901 04/09/13 1530 04/10/13 0535  HGB 10.4* 10.8* 9.5*  HCT 28.3* 29.5* 26.9*  WBC 13.7* 15.3* 12.4*  PLT 190 199 191   Ct Angio Chest Pe W/cm &/or Wo Cm  04/09/2013   *RADIOLOGY REPORT*  Clinical Data: Left  upper chest pain and shortness of breath. Nonproductive cough and fever.  CT ANGIOGRAPHY CHEST  Technique:  Multidetector CT imaging of the chest using the standard protocol during bolus administration of intravenous contrast. Multiplanar reconstructed images including MIPs were obtained and reviewed to evaluate the vascular anatomy.  Contrast: OMNIPAQUE IOHEXOL 350 MG/ML SOLN  Comparison: Chest radiograph 04/09/2013.  Findings: No pulmonary embolus.  Mediastinal lymph nodes measure up to 9 mm in the low right paratracheal station.  Axillary lymph nodes measure up to 13 mm on the left.  Heart is at the upper limits of normal in size to mildly enlarged.  No pericardial effusion.  Trace left pleural fluid.  Large area of airspace consolidation and surrounding inflammatory change in the left upper lobe with internal low attenuation and air.  Airway remains grossly patent. Minimal scattered peribronchovascular nodularity in the right lung.  Incidental imaging of the upper abdomen shows no acute findings. No worrisome lytic or sclerotic lesions.  IMPRESSION:  1.  Negative for pulmonary embolus. 2.  Consolidative findings in the left upper lobe are most consistent with pneumonia and associated central abscess. Follow-up to clearing is recommended as malignancy cannot be definitively excluded.  These results were called by telephone on 04/09/2013 at 1200 hours to Dr. Rubin Payor, who verbally acknowledged these results. 3.  Mediastinal and axillary lymph nodes may be reactive. 4.  Trace left pleural fluid.   Original Report Authenticated By: Leanna Battles, M.D.   Dg Chest Portable 1 View  04/09/2013   *RADIOLOGY REPORT*  Clinical Data: Chest pain  PORTABLE CHEST - 1 VIEW  Comparison: July 03, 2007  Findings:  There is a large area of consolidation with questionable mass in the left upper lobe.  This area of opacity measures 10.1 x 6.3 cm.  Right lung is clear.  Heart is mildly enlarged with normal pulmonary  vascularity.  No adenopathy.  IMPRESSION: Consolidation with questionable mass left upper lobe.  Advise correlation with contrast enhanced chest CT to further evaluate. Right lung clear.  Mild cardiomegaly.   Original Report Authenticated By: Bretta Bang, M.D.    ASSESSMENT / PLAN:  A: LUL pulmonary abscess likely due to poor dentition vs. ? TB exposure Poor dentition with multiple caries.  HIV positive.  P:  - Inpatient and outpatient abx therapy until dental cavities treated - Referral to dentist for treatment of dental cavities as I suspect that is the cause of her pulmonary abscess. - Quantiferon gold to r/o TB. - ID recommending a bronch but patient ate this AM, will schedule for next week. - Repeat imaging in 4-6 weeks to determine if abx therapy treated lung abscess, will need abx til cavity closure. - Antibiotics per primary, would recommend addition of anaerobic coverage given poor dentition.  Will continue to follow with you.  Alyson Reedy, M.D. Pulmonary and Critical Care Medicine [facility] Pager: [phone]  04/09/2013, 2:45 PM

## 2013-04-10 NOTE — Care Management Note (Addendum)
    Page 1 of 2   04/22/2013     2:44:45 PM   CARE MANAGEMENT NOTE 04/22/2013  Patient:  Monique, [name]   Account Number:  [id number]  Date Initiated:  04/09/2013  Documentation initiated by:  Junius Creamer  Subjective/Objective Assessment:   adm w fever     Action/Plan:   lives alone   Anticipated DC Date:     Anticipated DC Plan:        DC Planning Services  CM consult      Choice offered to / List presented to:          [facility] arranged  HH-1 RN      [facility] agency  [facility]   Status of service:   Medicare Important Message given?   (If response is "NO", the following Medicare IM given date fields will be blank) Date Medicare IM given:   Date Additional Medicare IM given:    Discharge Disposition:    Per UR Regulation:  Reviewed for med. necessity/level of care/duration of stay  If discussed at Long Length of Stay Meetings, dates discussed:    Comments:  04-22-13 Ms Monique [name] returned phone call . All required information faxed to [phone]. Ms Monique [name] will call patient to arrange home visit for tomorrow 04-23-13 . Patinet aware.  Ronny Flurry RN BSn [phone]   04-22-13 MAP program rep called back with directions to call Monique [name] ( supervisor ) [phone] . Voice mail left for Ms Monique [name] awaiting call back  Ronny Flurry RN BSN [phone]   04-22-13 Planning discharge today  Monique [name] at  [facility] aware and will call patinet on Monday . MATCH letter done .  TB medications covered by [facility] program . MATCH letter given to patient and explained . Patient voiced understanding .  Called Deptment of Health regarding TB , left voice mail . Patient has follow up at ID clinic .  Ronny Flurry RN BSN [phone]    04-21-13 Spoke with Monique [name] at  [employer] . ADAP paper work completed , however Monique [name] needs photo ID and proof of address, same faxed to [place] at  [facility] [phone] approval for  assistance for 042 medications can take up to 3 weeks . Monique [name] will check on emergency assistance and call me back . Patient aware.  Ronny Flurry RN BSN [phone]   04-20-13 Left message for [facility] to follow up on 042 medications at discharge .  Called [employer] assistance line , informed to call back on day of discharge , since coverage would be good for 30 days from time of call  Ronny Flurry RN BSN [phone]    04-15-13 New DX of 042 . [facility] , spoke with Claris Che , [phone] Public affairs consultant ) will see patient today to start paperwork for ADAP application ( assistance [phone] medications) .  Ronny Flurry RN BSN [phone]    8/15 1426 Monique dowell rn,bsn left pt 2 prescription discount cards. gave pt [facility] primary care resource list. pt states no pcp at present. she will look over list.

## 2013-04-10 NOTE — Progress Notes (Addendum)
INFECTIOUS DISEASE ATTENDING ADDENDUM:     [facility]   Date: 04/10/2013  Patient name: Monique [name]  Medical record number: [id number]  Date of birth: 22-Mar-1971    This patient has been seen and discussed with the house staff. Please see their note for complete details. I concur with their findings with the following additions/corrections:  #1 With re to HIV:  Patient with HIV positive EIA, her WB and RNA are pending  I explained in detail the nature of HIV infection, how the virus invades helper T cells and plates them to the point where at the CD4 count ultimately dropped below 200 patient has HIV and AIDS. Explained to patient that her initial all lysed I was positive and that she likely had HIV but we are waiting for confirmatory testing in the form of her Western blot and HIV RNA.  She DOES NOT WANT HER STATUS DISCUSSED WITH ANY FAMILY MEMBER WHATSOEVER at this time  I explained that we would be able to get her on very good meds to control the virus  There is option for her of clinical trial ARIA sponsored by [employer] where pt would be able to get free [facility] labs, and clinic visits (for study) x 48 weeks.  I will need to make sure that she does not have any exclusion criteria  Regardless we will enroll her in RW and ADAP and she could get meds that way IF she didn't go into trial (though I think trial would be better option  enroll IF she is eligible)  I am ordering Hep panel, HLA B701 testing  She needs pneumovax  I spent greater than 45 minutes with the patient including greater than 50% of time in face to face counsel of the patient and in coordination of their care.  #2 with re to her Lung abscess: HIV positivity increased RISk that she could have her Bactrim tuberculosis infection.  Therefore we will obtain 3 sputum for AFB smear and culture. If she cannot expectorate sufficient sputum for these 3 Sustiva we'll asked respiratory therapist to  induce sputum.  We cannot get satisfactory sputum to rule out TB would ask pulmonary critical care medicine to perform bronchoscopy for bacterial cultures and AFB stains and cultures as well as fungal cultures.  I will check Cryptococcal antigen in serum  Vancomycin and Unasyn will provide excellent coverage for MRSA, Strep and anerobes  Dr. Luciana Axe is covering this weekend and will followup with the patient   [name] 04/10/2013, 11:11 AM

## 2013-04-10 NOTE — Progress Notes (Signed)
[facility]    Subjective: Patient states from a respiratory standpoint she is feeling a little better today. She says she is coughing slightly less although her sputum is productive of phlegm. From the standpoint of her new diagnosis of HIV she is appropriately tearful and a little anxious. She wishes for no information to be discussed with any visitors present (although she has told her daughter according to her). She denies any subjective fevers, chills, or sweats and has no change in her bowel or urinary function.   Antibiotics:  Anti-infectives   Start     Dose/Rate Route Frequency Ordered Stop   04/10/13 1400  vancomycin (VANCOCIN) IVPB 1000 mg/200 mL premix     1,000 mg 200 mL/hr over 60 Minutes Intravenous Every 24 hours 04/09/13 1323     04/09/13 2000  Ampicillin-Sulbactam (UNASYN) 3 g in sodium chloride 0.9 % 100 mL IVPB     3 g 100 mL/hr over 60 Minutes Intravenous Every 8 hours 04/09/13 1328     04/09/13 1330  vancomycin (VANCOCIN) 1,250 mg in sodium chloride 0.9 % 250 mL IVPB     1,250 mg 166.7 mL/hr over 90 Minutes Intravenous STAT 04/09/13 1322 04/09/13 1538   04/09/13 1315  clindamycin (CLEOCIN) IVPB 600 mg     600 mg 100 mL/hr over 30 Minutes Intravenous  Once 04/09/13 1302 04/09/13 1359   04/09/13 0900  cefTRIAXone (ROCEPHIN) 1 g in dextrose 5 % 50 mL IVPB     1 g 100 mL/hr over 30 Minutes Intravenous  Once 04/09/13 0850 04/09/13 1042   04/09/13 0900  azithromycin (ZITHROMAX) 500 mg in dextrose 5 % 250 mL IVPB     500 mg 250 mL/hr over 60 Minutes Intravenous  Once 04/09/13 0850 04/09/13 1227      Medications: Scheduled Meds: . ampicillin-sulbactam (UNASYN) IV  3 g Intravenous Q8H  . enoxaparin (LOVENOX) injection  40 mg Subcutaneous Q24H  . [START ON 04/11/2013] pneumococcal 23 valent vaccine  0.5 mL Intramuscular Tomorrow-1000  . potassium chloride  40 mEq Oral Once  . sodium chloride  3 mL Intravenous Q12H  . sodium chloride  HYPERTONIC  5 mL Nebulization Once  . vancomycin  1,000 mg Intravenous Q24H   Continuous Infusions: . 0.9 % NaCl with KCl 40 mEq / L 125 mL/hr at 04/10/13 0409   PRN Meds:.acetaminophen, acetaminophen, oxyCODONE, sodium chloride HYPERTONIC   Objective: Weight change:   Intake/Output Summary (Last 24 hours) at 04/10/13 1107 Last data filed at 04/10/13 0800  Gross per 24 hour  Intake 2953.75 ml  Output   2000 ml  Net 953.75 ml   Blood pressure 128/88, pulse 104, temperature 98.2 F (36.8 C), temperature source Oral, resp. rate 35, height 5\' 2"  (1.575 m), weight 68 kg (149 lb 14.6 oz), last menstrual period 03/09/2013, SpO2 97.00%. Temp:  [98.2 F (36.8 C)-105.2 F (40.7 C)] 98.2 F (36.8 C) (08/15 0829) Pulse Rate:  [98-197] 104 (08/15 0829) Resp:  [14-35] 35 (08/15 0829) BP: (75-165)/(38-137) 128/88 mmHg (08/15 0829) SpO2:  [92 %-100 %] 97 % (08/15 0829) Weight:  [68 kg (149 lb 14.6 oz)] 68 kg (149 lb 14.6 oz) (08/14 1300)  Physical Exam: General: Tearful, Alert and awake, oriented x3, not in any acute distress. HEENT: anicteric sclera, pupils reactive to light and accommodation, EOMI CVS regular rate, normal r,  no murmur rubs or gallops Chest: ronchi LUL, coarse breath sounds RML Abdomen: soft nontender, nondistended, normal bowel sounds Extremities:  no  clubbing or edema noted bilaterally Skin: no rashes Lymph: no new lymphadenopathy Neuro: nonfocal  Lab Results:  Recent Labs  04/09/13 1530 04/10/13 0535  WBC 15.3* 12.4*  HGB 10.8* 9.5*  HCT 29.5* 26.9*  PLT 199 191    BMET  Recent Labs  04/09/13 1530 04/10/13 0535  NA 128* 132*  K 3.6 4.0  CL 98 105  CO2 18* 16*  GLUCOSE 93 100*  BUN 15 18  CREATININE 1.48* 1.45*  CALCIUM 8.0* 7.7*    Micro Results: Recent Results (from the past 240 hour(s))  CULTURE, BLOOD (ROUTINE X 2)     Status: None   Collection Time    04/09/13  8:55 AM      Result Value Range Status   Specimen Description BLOOD  RIGHT ANTECUBITAL   Final   Special Requests BOTTLES DRAWN AEROBIC ONLY   Final   Culture  Setup Time     Final   Value: 04/09/2013 17:08     Performed at [facility]   Culture     Final   Value:        BLOOD CULTURE RECEIVED NO GROWTH TO DATE CULTURE WILL BE HELD FOR 5 DAYS BEFORE ISSUING A FINAL NEGATIVE REPORT     Performed at [facility]   Report Status PENDING   Incomplete  CULTURE, BLOOD (ROUTINE X 2)     Status: None   Collection Time    04/09/13  9:02 AM      Result Value Range Status   Specimen Description BLOOD ARM RIGHT   Final   Special Requests BOTTLES DRAWN AEROBIC ONLY   Final   Culture  Setup Time     Final   Value: 04/09/2013 17:07     Performed at [facility]   Culture     Final   Value:        BLOOD CULTURE RECEIVED NO GROWTH TO DATE CULTURE WILL BE HELD FOR 5 DAYS BEFORE ISSUING A FINAL NEGATIVE REPORT     Performed at [facility]   Report Status PENDING   Incomplete  MRSA PCR SCREENING     Status: None   Collection Time    04/09/13  3:05 PM      Result Value Range Status   MRSA by PCR NEGATIVE  NEGATIVE Final   Comment:            The GeneXpert MRSA Assay (FDA     approved for NASAL specimens     only), is one component of a     comprehensive MRSA colonization     surveillance program. It is not     intended to diagnose MRSA     infection nor to guide or     monitor treatment for     MRSA infections.  CULTURE, EXPECTORATED SPUTUM-ASSESSMENT     Status: None   Collection Time    04/09/13  5:18 PM      Result Value Range Status   Specimen Description SPUTUM   Final   Special Requests Normal   Final   Sputum evaluation     Final   Value: MICROSCOPIC FINDINGS SUGGEST THAT THIS SPECIMEN IS NOT REPRESENTATIVE OF LOWER RESPIRATORY SECRETIONS. PLEASE RECOLLECT.     CALLED TO RN S.MERLINI AT 0053 04/10/13 BY L.PITT   Report Status 04/10/2013 FINAL   Final  CULTURE, EXPECTORATED SPUTUM-ASSESSMENT     Status: None     Collection  Time    04/10/13  3:04 AM      Result Value Range Status   Specimen Description SPUTUM   Final   Special Requests NONE   Final   Sputum evaluation     Final   Value: MICROSCOPIC FINDINGS SUGGEST THAT THIS SPECIMEN IS NOT REPRESENTATIVE OF LOWER RESPIRATORY SECRETIONS. PLEASE RECOLLECT.     CALLED TO MERLINI,S RN 04/10/2013 0336 JORDANS   Report Status 04/10/2013 FINAL   Final    Studies/Results: Ct Angio Chest Pe W/cm &/or Wo Cm  04/09/2013   *RADIOLOGY REPORT*  Clinical Data: Left upper chest pain and shortness of breath. Nonproductive cough and fever.  CT ANGIOGRAPHY CHEST  Technique:  Multidetector CT imaging of the chest using the standard protocol during bolus administration of intravenous contrast. Multiplanar reconstructed images including MIPs were obtained and reviewed to evaluate the vascular anatomy.  Contrast: OMNIPAQUE IOHEXOL 350 MG/ML SOLN  Comparison: Chest radiograph 04/09/2013.  Findings: No pulmonary embolus.  Mediastinal lymph nodes measure up to 9 mm in the low right paratracheal station.  Axillary lymph nodes measure up to 13 mm on the left.  Heart is at the upper limits of normal in size to mildly enlarged.  No pericardial effusion.  Trace left pleural fluid.  Large area of airspace consolidation and surrounding inflammatory change in the left upper lobe with internal low attenuation and air.  Airway remains grossly patent. Minimal scattered peribronchovascular nodularity in the right lung.  Incidental imaging of the upper abdomen shows no acute findings. No worrisome lytic or sclerotic lesions.  IMPRESSION:  1.  Negative for pulmonary embolus. 2.  Consolidative findings in the left upper lobe are most consistent with pneumonia and associated central abscess. Follow-up to clearing is recommended as malignancy cannot be definitively excluded.  These results were called by telephone on 04/09/2013 at 1200 hours to Dr. Rubin Payor, who verbally acknowledged these  results. 3.  Mediastinal and axillary lymph nodes may be reactive. 4.  Trace left pleural fluid.   Original Report Authenticated By: Leanna Battles, M.D.   Dg Chest Portable 1 View  04/09/2013   *RADIOLOGY REPORT*  Clinical Data: Chest pain  PORTABLE CHEST - 1 VIEW  Comparison: July 03, 2007  Findings:  There is a large area of consolidation with questionable mass in the left upper lobe.  This area of opacity measures 10.1 x 6.3 cm.  Right lung is clear.  Heart is mildly enlarged with normal pulmonary vascularity.  No adenopathy.  IMPRESSION: Consolidation with questionable mass left upper lobe.  Advise correlation with contrast enhanced chest CT to further evaluate. Right lung clear.  Mild cardiomegaly.   Original Report Authenticated By: Bretta Bang, M.D.      Assessment/Plan: Monique [name] is a 42 y.o. female with HTN who presented with fevers/chills, left sided substernal chest pain TTP. Chest CT reveals a pneumonia and LUL abscess. HIV Ab reactive and she has been placed on airborne precautions and TB workup has been initiated in the context of her clinical picture.   Pneumonia, with abscess:  --continue vanc and unasyn per pharmacy, appreciate pharmacist assistance in the management of this patient  --f/u blood cx  --sputum culture --will continue to follow abscess response to abx and consider needle aspiration if needed  HIV: New diagnosis this admission. Could very well explain her immunocompromise contributing her to her lung infection --HIV Ab retest, HIV 1 RNA, CD4 count --Crypto Ag --HepC Ab, Acute Hep panel --HLA [id number]   Rule  out TB: --Quantiferon Gold --AFB sputum culture with smear --airborne precautions    LOS: 1 day   Lewie Chamber 04/10/2013, 11:07 AM

## 2013-04-10 NOTE — Progress Notes (Signed)
[facility] Progress Note [place] TEAM 1 - Stepdown ICU Team   Monique [name] ZOX:[id number] DOB: [birth year] DOA: 04/09/2013 PCP: Default, Provider, MD  Brief narrative: 42 year old female patient with only significant medical history of hypertension and tobacco abuse. Presented to the emergency department with subjective fevers and chills for 3 days and nonproductive cough. This is been associated with anorexia for one week. The evening prior to presentation she had been cleaning her house she developed sharp pain in her left chest that was quite severe and was associated with shortness of breath she had no other constitutional symptoms. In the emergency department the patient was tachycardic with a heart rate of 120 and respiratory rate of 25 she had a fever of 102.8 and her O2 sats are 90% on room air. She had mild leukocytosis with a white count of 13,700 hemoglobin was 10.4 sodium 125 potassium 2.6 bicarbonate 18 creatinine 1.65. Portable chest x-ray was concerning for a mass in the left upper lobe. CT angiogram of the chest revealed consolidation in the left upper lobe consistent with pneumonia and a possible central pulmonary abscess. No evidence of pulmonary embolus. Because of her symptoms and vital signs she was given 3 L of normal saline and started on IV Rocephin and azithromycin followed by a dose of IV clindamycin and was admitted to the step down unit.  Assessment/Plan: Active Problems:   Sepsis -due to pulmonary infection -cont supportive care with aggressive hydration -follow up on all cx's -hemodynamically stable -TM since admission 105.2    Acute respiratory failure with hypoxia/Pneumonia with lung abscess -still quite dyspneic off oxygen -concern given new dx HIV this may be opportunistic infection -ID following and managing anbx's- TB work up in process and HIV confirmatory studies in progress -Pulmonary consulted initially and following -cont empiric  anbx's -AFB sputum and quantiferon Gold pending    Acute kidney injury -due to [facility] -follow lytes    Human immunodeficiency virus (HIV) disease -new dx- UNDER NO CIRCUMSTANCES DOES PT WANT THIS DIAGNOSIS DISCUSSED WITH ANY FRIENDS OR FAMILY!!! -work up per ID    Dehydration with hyponatremia -cont IVF    Hypokalemia -oral replete   DVT prophylaxis: Lovenox Code Status: Full Family Communication: Patient Disposition Plan/Expected LOS: Stepdown Isolation: Airborne until tuberculin status can be clarified Nutritional Status: Acute protein calorie malnutrition related to acute illness. Pending immunodeficiency workup patient may also be at risk for chronic protein calorie malnutrition related to increasing metabolic demands from immunodeficiency  Consultants: Pulmonary Infectious disease  Procedures: None  Antibiotics: Unasyn 8/14 >>> Azithromycin 8/14 >>> 8/15 Ceftriaxone 8/14 >>> 8/15 Clindamycin 8/14 >>> 8/15 Vancomycin 8/14 >>>  HPI/Subjective: Patient alert and endorses feels much better than she did 24 hours previous. Still short-winded. No pleuritic chest pain described.  Objective: Blood pressure 126/87, pulse 101, temperature 98.2 F (36.8 C), temperature source Oral, resp. rate 35, height 5\' 2"  (1.575 m), weight 68 kg (149 lb 14.6 oz), last menstrual period 03/09/2013, SpO2 100.00%.  Intake/Output Summary (Last 24 hours) at 04/10/13 1311 Last data filed at 04/10/13 1200  Gross per 24 hour  Intake 3453.75 ml  Output   2650 ml  Net 803.75 ml     Exam: General: No acute respiratory distress Lungs: Coarse to auscultation bilaterally with more rhonchorous sounds left midfield, 2 L nasal cannula oxygen, mild tachypnea Cardiovascular: Regular rate/mildly tachycardic without murmur gallop or rub normal S1 and S2, no peripheral edema or JVD Abdomen: Nontender, nondistended, soft, bowel sounds positive, no  rebound, no ascites, no appreciable mass Musculoskeletal:  No significant cyanosis, clubbing of bilateral lower extremities Neurological: Alert and oriented x 3, moves all extremities x 4 without focal neurological deficits, CN 2-12 intact  Scheduled Meds:  Scheduled Meds: . ampicillin-sulbactam (UNASYN) IV  3 g Intravenous Q8H  . enoxaparin (LOVENOX) injection  40 mg Subcutaneous Q24H  . [START ON 04/11/2013] pneumococcal 23 valent vaccine  0.5 mL Intramuscular Tomorrow-1000  . sodium chloride  3 mL Intravenous Q12H  . vancomycin  1,000 mg Intravenous Q24H   Continuous Infusions: . 0.9 % NaCl with KCl 40 mEq / L 125 mL/hr at 04/10/13 0409    **Reviewed in detail by the Attending Physicia  Data Reviewed: Basic Metabolic Panel:  Recent Labs Lab 04/09/13 0901 04/09/13 1530 04/10/13 0535  NA 125* 128* 132*  K 2.6* 3.6 4.0  CL 95* 98 105  CO2 18* 18* 16*  GLUCOSE 129* 93 100*  BUN 17 15 18   CREATININE 1.65* 1.48* 1.45*  CALCIUM 8.6 8.0* 7.7*  MG 1.6  --   --    Liver Function Tests:  Recent Labs Lab 04/09/13 0901  AST 38*  ALT 22  ALKPHOS 110  BILITOT 0.6  PROT 7.6  ALBUMIN 2.2*   No results found for this basename: LIPASE, AMYLASE,  in the last 168 hours No results found for this basename: AMMONIA,  in the last 168 hours CBC:  Recent Labs Lab 04/09/13 0901 04/09/13 1530 04/10/13 0535  WBC 13.7* 15.3* 12.4*  NEUTROABS 11.4*  --   --   HGB 10.4* 10.8* 9.5*  HCT 28.3* 29.5* 26.9*  MCV 81.6 82.2 82.3  PLT 190 199 191   Cardiac Enzymes: No results found for this basename: CKTOTAL, CKMB, CKMBINDEX, TROPONINI,  in the last 168 hours BNP (last 3 results) No results found for this basename: PROBNP,  in the last 8760 hours CBG: No results found for this basename: GLUCAP,  in the last 168 hours  Recent Results (from the past 240 hour(s))  CULTURE, BLOOD (ROUTINE X 2)     Status: None   Collection Time    04/09/13  8:55 AM      Result Value Range Status   Specimen Description BLOOD RIGHT ANTECUBITAL   Final    Special Requests BOTTLES DRAWN AEROBIC ONLY   Final   Culture  Setup Time     Final   Value: 04/09/2013 17:08     Performed at [facility]   Culture     Final   Value:        BLOOD CULTURE RECEIVED NO GROWTH TO DATE CULTURE WILL BE HELD FOR 5 DAYS BEFORE ISSUING A FINAL NEGATIVE REPORT     Performed at [facility]   Report Status PENDING   Incomplete  CULTURE, BLOOD (ROUTINE X 2)     Status: None   Collection Time    04/09/13  9:02 AM      Result Value Range Status   Specimen Description BLOOD ARM RIGHT   Final   Special Requests BOTTLES DRAWN AEROBIC ONLY   Final   Culture  Setup Time     Final   Value: 04/09/2013 17:07     Performed at [facility]   Culture     Final   Value:        BLOOD CULTURE RECEIVED NO GROWTH TO DATE CULTURE WILL BE HELD FOR 5 DAYS BEFORE ISSUING A FINAL NEGATIVE REPORT  Performed at [facility]   Report Status PENDING   Incomplete  MRSA PCR SCREENING     Status: None   Collection Time    04/09/13  3:05 PM      Result Value Range Status   MRSA by PCR NEGATIVE  NEGATIVE Final   Comment:            The GeneXpert MRSA Assay (FDA     approved for NASAL specimens     only), is one component of a     comprehensive MRSA colonization     surveillance program. It is not     intended to diagnose MRSA     infection nor to guide or     monitor treatment for     MRSA infections.  CULTURE, EXPECTORATED SPUTUM-ASSESSMENT     Status: None   Collection Time    04/09/13  5:18 PM      Result Value Range Status   Specimen Description SPUTUM   Final   Special Requests Normal   Final   Sputum evaluation     Final   Value: MICROSCOPIC FINDINGS SUGGEST THAT THIS SPECIMEN IS NOT REPRESENTATIVE OF LOWER RESPIRATORY SECRETIONS. PLEASE RECOLLECT.     CALLED TO RN S.MERLINI AT 0053 04/10/13 BY L.PITT   Report Status 04/10/2013 FINAL   Final  CULTURE, EXPECTORATED SPUTUM-ASSESSMENT     Status: None   Collection Time     04/10/13  3:04 AM      Result Value Range Status   Specimen Description SPUTUM   Final   Special Requests NONE   Final   Sputum evaluation     Final   Value: MICROSCOPIC FINDINGS SUGGEST THAT THIS SPECIMEN IS NOT REPRESENTATIVE OF LOWER RESPIRATORY SECRETIONS. PLEASE RECOLLECT.     CALLED TO MERLINI,S RN 04/10/2013 0336 JORDANS   Report Status 04/10/2013 FINAL   Final     Studies:  Recent x-ray studies have been reviewed in detail by the Attending Physician    Junious Silk, ANP [facility] Office  [phone] Pager [phone]  **If unable to reach the above provider after paging please contact the Flow Manager @ [phone]  On-Call/Text Page:      [email or web address]      password [email or web address]  If 7PM-7AM, please contact night-coverage [email or web address] Password [email or web address] 04/10/2013, 1:11 PM   LOS: 1 day   I have examined the patient, reviewed the chart and modified the above note which I agree with.   Saraiya Kozma,MD [phone] 04/10/2013, 2:35 PM

## 2013-04-11 DIAGNOSIS — A419 Sepsis, unspecified organism: Secondary | ICD-10-CM

## 2013-04-11 DIAGNOSIS — E871 Hypo-osmolality and hyponatremia: Secondary | ICD-10-CM

## 2013-04-11 LAB — CBC
HCT: 26.6 % — ABNORMAL LOW (ref 36.0–46.0)
MCH: 29.3 pg (ref 26.0–34.0)
MCHC: 36.1 g/dL — ABNORMAL HIGH (ref 30.0–36.0)
RDW: 13 % (ref 11.5–15.5)

## 2013-04-11 LAB — COMPREHENSIVE METABOLIC PANEL
Albumin: 1.7 g/dL — ABNORMAL LOW (ref 3.5–5.2)
Alkaline Phosphatase: 97 U/L (ref 39–117)
BUN: 15 mg/dL (ref 6–23)
Calcium: 8.2 mg/dL — ABNORMAL LOW (ref 8.4–10.5)
Creatinine, Ser: 1.18 mg/dL — ABNORMAL HIGH (ref 0.50–1.10)
GFR calc Af Amer: 65 mL/min — ABNORMAL LOW (ref >90)
Glucose, Bld: 121 mg/dL — ABNORMAL HIGH (ref 70–99)
Potassium: 4.2 mEq/L (ref 3.5–5.1)
Total Protein: 6.8 g/dL (ref 6.0–8.3)

## 2013-04-11 LAB — CRYPTOCOCCAL ANTIGEN

## 2013-04-11 MED ORDER — IBUPROFEN 400 MG PO TABS
400.0000 mg | ORAL_TABLET | Freq: Four times a day (QID) | ORAL | Status: DC | PRN
  Administered 2013-04-11 – 2013-04-15 (×8): 400 mg via ORAL
  Filled 2013-04-11 (×8): qty 1

## 2013-04-11 MED ORDER — VANCOMYCIN HCL IN DEXTROSE 1-5 GM/200ML-% IV SOLN
1000.0000 mg | Freq: Two times a day (BID) | INTRAVENOUS | Status: DC
  Administered 2013-04-11 – 2013-04-13 (×4): 1000 mg via INTRAVENOUS
  Filled 2013-04-11 (×7): qty 200

## 2013-04-11 MED ORDER — SODIUM CHLORIDE 0.9 % IV SOLN
3.0000 g | Freq: Four times a day (QID) | INTRAVENOUS | Status: DC
  Administered 2013-04-11 – 2013-04-15 (×15): 3 g via INTRAVENOUS
  Filled 2013-04-11 (×22): qty 3

## 2013-04-11 NOTE — Progress Notes (Signed)
[facility]  Date of Admission:  04/09/2013  Antibiotics: Antibiotics Given (last 72 hours)   Date/Time Action Medication Dose Rate   04/09/13 2039 Given   Ampicillin-Sulbactam (UNASYN) 3 g in sodium chloride 0.9 % 100 mL IVPB 3 g 100 mL/hr   04/10/13 0408 Given   Ampicillin-Sulbactam (UNASYN) 3 g in sodium chloride 0.9 % 100 mL IVPB 3 g 100 mL/hr   04/10/13 1251 Given   Ampicillin-Sulbactam (UNASYN) 3 g in sodium chloride 0.9 % 100 mL IVPB 3 g 100 mL/hr   04/10/13 1406 Given   vancomycin (VANCOCIN) IVPB 1000 mg/200 mL premix 1,000 mg 200 mL/hr   04/10/13 2035 Given   Ampicillin-Sulbactam (UNASYN) 3 g in sodium chloride 0.9 % 100 mL IVPB 3 g 100 mL/hr   04/11/13 0325 Given   Ampicillin-Sulbactam (UNASYN) 3 g in sodium chloride 0.9 % 100 mL IVPB 3 g 100 mL/hr      Subjective: No complaints, not symptomatically sob  Objective: Temp:  [97.9 F (36.6 C)-103.2 F (39.6 C)] 98.3 F (36.8 C) (08/16 1150) Pulse Rate:  [96-137] 99 (08/16 0400) Resp:  [22-34] 26 (08/16 0400) BP: (122-146)/(90-117) 122/91 mmHg (08/16 0400) SpO2:  [95 %-100 %] 96 % (08/16 1150) Weight:  [194 lb 14.2 oz (88.4 kg)] 194 lb 14.2 oz (88.4 kg) (08/16 0400)  General: Awake, alert, nad Skin: no rashes Lungs: decreased air entry, + wheeze mild bilateral Cor: RRR without m Abdomen: soft, nt, nd, +bs Ext: no edema  Lab Results Lab Results  Component Value Date   WBC 12.4* 04/11/2013   HGB 9.6* 04/11/2013   HCT 26.6* 04/11/2013   MCV 81.1 04/11/2013   PLT 221 04/11/2013    Lab Results  Component Value Date   CREATININE 1.18* 04/11/2013   BUN 15 04/11/2013   NA 134* 04/11/2013   K 4.2 04/11/2013   CL 107 04/11/2013   CO2 17* 04/11/2013    Lab Results  Component Value Date   ALT 35 04/11/2013   AST 72* 04/11/2013   ALKPHOS 97 04/11/2013   BILITOT 0.3 04/11/2013      Microbiology: Recent Results (from the past 240 hour(s))  CULTURE, BLOOD (ROUTINE X 2)     Status: None   Collection Time    04/09/13  8:55 AM      Result Value Range Status   Specimen Description BLOOD RIGHT ANTECUBITAL   Final   Special Requests BOTTLES DRAWN AEROBIC ONLY   Final   Culture  Setup Time     Final   Value: 04/09/2013 17:08     Performed at [facility]   Culture     Final   Value:        BLOOD CULTURE RECEIVED NO GROWTH TO DATE CULTURE WILL BE HELD FOR 5 DAYS BEFORE ISSUING A FINAL NEGATIVE REPORT     Performed at [facility]   Report Status PENDING   Incomplete  CULTURE, BLOOD (ROUTINE X 2)     Status: None   Collection Time    04/09/13  9:02 AM      Result Value Range Status   Specimen Description BLOOD ARM RIGHT   Final   Special Requests BOTTLES DRAWN AEROBIC ONLY   Final   Culture  Setup Time     Final   Value: 04/09/2013 17:07     Performed at [facility]   Culture     Final   Value:  BLOOD CULTURE RECEIVED NO GROWTH TO DATE CULTURE WILL BE HELD FOR 5 DAYS BEFORE ISSUING A FINAL NEGATIVE REPORT     Performed at [facility]   Report Status PENDING   Incomplete  MRSA PCR SCREENING     Status: None   Collection Time    04/09/13  3:05 PM      Result Value Range Status   MRSA by PCR NEGATIVE  NEGATIVE Final   Comment:            The GeneXpert MRSA Assay (FDA     approved for NASAL specimens     only), is one component of a     comprehensive MRSA colonization     surveillance program. It is not     intended to diagnose MRSA     infection nor to guide or     monitor treatment for     MRSA infections.  CULTURE, EXPECTORATED SPUTUM-ASSESSMENT     Status: None   Collection Time    04/09/13  5:18 PM      Result Value Range Status   Specimen Description SPUTUM   Final   Special Requests Normal   Final   Sputum evaluation     Final   Value: MICROSCOPIC FINDINGS SUGGEST THAT THIS SPECIMEN IS NOT REPRESENTATIVE OF LOWER RESPIRATORY SECRETIONS. PLEASE RECOLLECT.     CALLED TO RN S.MERLINI AT 0053 04/10/13 BY L.PITT     Report Status 04/10/2013 FINAL   Final  CULTURE, EXPECTORATED SPUTUM-ASSESSMENT     Status: None   Collection Time    04/10/13  3:04 AM      Result Value Range Status   Specimen Description SPUTUM   Final   Special Requests NONE   Final   Sputum evaluation     Final   Value: MICROSCOPIC FINDINGS SUGGEST THAT THIS SPECIMEN IS NOT REPRESENTATIVE OF LOWER RESPIRATORY SECRETIONS. PLEASE RECOLLECT.     CALLED TO MERLINI,S RN 04/10/2013 0336 JORDANS   Report Status 04/10/2013 FINAL   Final    Studies/Results: No results found.  Assessment/Plan: 1)  Lung abscess - negative culture to date.  Anaerobe vs MRSA.  On vancomycin and ancef.  No changes.   -AFB x 2 sent.  If 3 smears negative, can d/c isolation  2)  HIV - awaiting confirmation  Gypsy Kellogg, Molly Maduro, MD [facility] [email or web address] [phone] pager   [phone] cell 04/11/2013, 12:08 PM

## 2013-04-11 NOTE — Progress Notes (Signed)
[facility] Progress Note [place] TEAM 1 - Stepdown ICU Team   Monique [name]:[id number] DOB: [birth year] DOA: 04/09/2013 PCP: Default, Provider, MD  Brief narrative: 42 year old female patient with only significant medical history of hypertension and tobacco abuse. Presented to the emergency department with subjective fevers and chills for 3 days and nonproductive cough. This is been associated with anorexia for one week. The evening prior to presentation she had been cleaning her house she developed sharp pain in her left chest that was quite severe and was associated with shortness of breath she had no other constitutional symptoms. In the emergency department the patient was tachycardic with a heart rate of 120 and respiratory rate of 25 she had a fever of 102.8 and her O2 sats are 90% on room air. She had mild leukocytosis with a white count of 13,700 hemoglobin was 10.4 sodium 125 potassium 2.6 bicarbonate 18 creatinine 1.65. Portable chest x-ray was concerning for a mass in the left upper lobe. CT angiogram of the chest revealed consolidation in the left upper lobe consistent with pneumonia and a possible central pulmonary abscess. No evidence of pulmonary embolus. Because of her symptoms and vital signs she was given 3 L of normal saline and started on IV Rocephin and azithromycin followed by a dose of IV clindamycin and was admitted to the step down unit.  Assessment/Plan: Active Problems:   Sepsis -due to pulmonary infection -follow up on all cx's -hemodynamically stable -TM since admission 105.2    Acute respiratory failure with hypoxia/Pneumonia with lung abscess -still quite dyspneic off oxygen -concern given new dx HIV this may be opportunistic infection -ID following and managing anbx's- TB work up in process and HIV confirmatory studies in progress -Pulmonary consulted initially and following -AFB sputum and quantiferon Gold pending    Acute kidney injury -due  to [facility] -follow lytes    Human immunodeficiency virus (HIV) disease -new dx- UNDER NO CIRCUMSTANCES DOES PT WANT THIS DIAGNOSIS DISCUSSED WITH ANY FRIENDS OR FAMILY!!! -work up per ID    Dehydration with hyponatremia -cont IVF- improving    Hypokalemia -oral repleted- resolved   DVT prophylaxis: Lovenox Code Status: Full Family Communication: Patient Disposition Plan/Expected LOS: Stepdown Isolation: Airborne until tuberculin status can be clarified Nutritional Status: Acute protein calorie malnutrition related to acute illness. Pending immunodeficiency workup patient may also be at risk for chronic protein calorie malnutrition related to increasing metabolic demands from immunodeficiency  Consultants: Pulmonary Infectious disease  Procedures: None  Antibiotics: Unasyn 8/14 >>> Azithromycin 8/14 >>> 8/15 Ceftriaxone 8/14 >>> 8/15 Clindamycin 8/14 >>> 8/15 Vancomycin 8/14 >>>  HPI/Subjective: Patient still having cough despite tessalon and delsym. Depressed and starts crying when discussing HIV.   Objective: Blood pressure 122/91, pulse 99, temperature 98.3 F (36.8 C), temperature source Oral, resp. rate 26, height 5\' 2"  (1.575 m), weight 88.4 kg (194 lb 14.2 oz), last menstrual period 03/09/2013, SpO2 96.00%.  Intake/Output Summary (Last 24 hours) at 04/11/13 1410 Last data filed at 04/11/13 1354  Gross per 24 hour  Intake 3356.25 ml  Output   2600 ml  Net 756.25 ml     Exam: General: No acute respiratory distress Lungs: decreased BS in left lung field, 2 L nasal cannula oxygen, mild tachypnea Cardiovascular: Regular rate/mildly tachycardic without murmur gallop or rub normal S1 and S2, no peripheral edema or JVD Abdomen: Nontender, nondistended, soft, bowel sounds positive, no rebound, no ascites, no appreciable mass Musculoskeletal: No significant cyanosis, clubbing of bilateral lower extremities  Neurological: Alert and oriented x 3, moves all extremities x 4  without focal neurological deficits, CN 2-12 intact  Scheduled Meds:  Scheduled Meds: . ampicillin-sulbactam (UNASYN) IV  3 g Intravenous Q8H  . benzonatate  100 mg Oral TID  . dextromethorphan  30 mg Oral BID  . enoxaparin (LOVENOX) injection  40 mg Subcutaneous Q24H  . pneumococcal 23 valent vaccine  0.5 mL Intramuscular Tomorrow-1000  . sodium chloride  3 mL Intravenous Q12H  . vancomycin  1,000 mg Intravenous Q24H   Continuous Infusions: . sodium chloride 125 mL/hr at 04/11/13 0000    **Reviewed in detail by the Attending Physicia  Data Reviewed: Basic Metabolic Panel:  Recent Labs Lab 04/09/13 0901 04/09/13 1530 04/10/13 0535 04/11/13 0500  NA 125* 128* 132* 134*  K 2.6* 3.6 4.0 4.2  CL 95* 98 105 107  CO2 18* 18* 16* 17*  GLUCOSE 129* 93 100* 121*  BUN 17 15 18 15   CREATININE 1.65* 1.48* 1.45* 1.18*  CALCIUM 8.6 8.0* 7.7* 8.2*  MG 1.6  --   --   --    Liver Function Tests:  Recent Labs Lab 04/09/13 0901 04/11/13 0500  AST 38* 72*  ALT 22 35  ALKPHOS 110 97  BILITOT 0.6 0.3  PROT 7.6 6.8  ALBUMIN 2.2* 1.7*   No results found for this basename: LIPASE, AMYLASE,  in the last 168 hours No results found for this basename: AMMONIA,  in the last 168 hours CBC:  Recent Labs Lab 04/09/13 0901 04/09/13 1530 04/10/13 0535 04/11/13 0500  WBC 13.7* 15.3* 12.4* 12.4*  NEUTROABS 11.4*  --   --   --   HGB 10.4* 10.8* 9.5* 9.6*  HCT 28.3* 29.5* 26.9* 26.6*  MCV 81.6 82.2 82.3 81.1  PLT 190 199 191 221   Cardiac Enzymes: No results found for this basename: CKTOTAL, CKMB, CKMBINDEX, TROPONINI,  in the last 168 hours BNP (last 3 results) No results found for this basename: PROBNP,  in the last 8760 hours CBG: No results found for this basename: GLUCAP,  in the last 168 hours  Recent Results (from the past 240 hour(s))  CULTURE, BLOOD (ROUTINE X 2)     Status: None   Collection Time    04/09/13  8:55 AM      Result Value Range Status   Specimen  Description BLOOD RIGHT ANTECUBITAL   Final   Special Requests BOTTLES DRAWN AEROBIC ONLY   Final   Culture  Setup Time     Final   Value: 04/09/2013 17:08     Performed at [facility]   Culture     Final   Value:        BLOOD CULTURE RECEIVED NO GROWTH TO DATE CULTURE WILL BE HELD FOR 5 DAYS BEFORE ISSUING A FINAL NEGATIVE REPORT     Performed at [facility]   Report Status PENDING   Incomplete  CULTURE, BLOOD (ROUTINE X 2)     Status: None   Collection Time    04/09/13  9:02 AM      Result Value Range Status   Specimen Description BLOOD ARM RIGHT   Final   Special Requests BOTTLES DRAWN AEROBIC ONLY   Final   Culture  Setup Time     Final   Value: 04/09/2013 17:07     Performed at [facility]   Culture     Final   Value:  BLOOD CULTURE RECEIVED NO GROWTH TO DATE CULTURE WILL BE HELD FOR 5 DAYS BEFORE ISSUING A FINAL NEGATIVE REPORT     Performed at [facility]   Report Status PENDING   Incomplete  MRSA PCR SCREENING     Status: None   Collection Time    04/09/13  3:05 PM      Result Value Range Status   MRSA by PCR NEGATIVE  NEGATIVE Final   Comment:            The GeneXpert MRSA Assay (FDA     approved for NASAL specimens     only), is one component of a     comprehensive MRSA colonization     surveillance program. It is not     intended to diagnose MRSA     infection nor to guide or     monitor treatment for     MRSA infections.  CULTURE, EXPECTORATED SPUTUM-ASSESSMENT     Status: None   Collection Time    04/09/13  5:18 PM      Result Value Range Status   Specimen Description SPUTUM   Final   Special Requests Normal   Final   Sputum evaluation     Final   Value: MICROSCOPIC FINDINGS SUGGEST THAT THIS SPECIMEN IS NOT REPRESENTATIVE OF LOWER RESPIRATORY SECRETIONS. PLEASE RECOLLECT.     CALLED TO RN S.MERLINI AT 0053 04/10/13 BY L.PITT   Report Status 04/10/2013 FINAL   Final  CULTURE, EXPECTORATED SPUTUM-ASSESSMENT      Status: None   Collection Time    04/10/13  3:04 AM      Result Value Range Status   Specimen Description SPUTUM   Final   Special Requests NONE   Final   Sputum evaluation     Final   Value: MICROSCOPIC FINDINGS SUGGEST THAT THIS SPECIMEN IS NOT REPRESENTATIVE OF LOWER RESPIRATORY SECRETIONS. PLEASE RECOLLECT.     CALLED TO MERLINI,S RN 04/10/2013 0336 JORDANS   Report Status 04/10/2013 FINAL   Final     Studies:  Recent x-ray studies have been reviewed in detail by the Attending Physician    Calvert Cantor, MD [phone]  **If unable to reach the above provider after paging please contact the Flow Manager @ [phone]  On-Call/Text Page:      [email or web address]      password [email or web address]  If 7PM-7AM, please contact night-coverage [email or web address] Password [email or web address] 04/11/2013, 2:10 PM   LOS: 2 days

## 2013-04-11 NOTE — Progress Notes (Signed)
ANTIBIOTIC CONSULT NOTE - FOLLOW UP  Pharmacy Consult for vancoymcin Monique Unasyn Indication: pneumonia with lung abscess  No Known Allergies  Patient Measurements: Height: 5\' 2"  (157.5 cm) Weight: 194 lb 14.2 oz (88.4 kg) IBW/kg (Calculated) : 50.1  Vital Signs: Temp: 98.3 F (36.8 C) (08/16 1150) Temp src: Oral (08/16 1150) BP: 122/91 mmHg (08/16 0400) Pulse Rate: 99 (08/16 0400) Intake/Output from previous day: 08/15 0701 - 08/16 0700 In: 4831.3 [P.O.:900; I.V.:2931.3; IV Piggyback:1000] Out: 2300 [Urine:2300] Intake/Output from this shift: Total I/O In: 240 [P.O.:240] Out: 1350 [Urine:1350]  Labs:  Recent Labs  04/09/13 1530 04/10/13 0535 04/11/13 0500  Monique 15.3* 12.4* 12.4*  HGB 10.8* 9.5* 9.6*  PLT 199 191 221  CREATININE 1.48* 1.45* 1.18*   Estimated Creatinine Clearance: 64.1 ml/min (by C-G formula based on Cr of 1.18). No results found for this basename: VANCOTROUGH, Leodis Binet, VANCORANDOM, GENTTROUGH, GENTPEAK, GENTRANDOM, TOBRATROUGH, TOBRAPEAK, TOBRARND, AMIKACINPEAK, AMIKACINTROU, AMIKACIN,  in the last 72 hours   Microbiology: Recent Results (from the past 720 hour(s))  CULTURE, BLOOD (ROUTINE X 2)     Status: None   Collection Time    04/09/13  8:55 AM      Result Value Range Status   Specimen Description BLOOD RIGHT ANTECUBITAL   Final   Special Requests BOTTLES DRAWN AEROBIC ONLY   Final   Culture  Setup Time     Final   Value: 04/09/2013 17:08     Performed at [facility]   Culture     Final   Value:        BLOOD CULTURE RECEIVED NO GROWTH TO DATE CULTURE WILL BE HELD FOR 5 DAYS BEFORE ISSUING A FINAL NEGATIVE REPORT     Performed at [facility]   Report Status PENDING   Incomplete  CULTURE, BLOOD (ROUTINE X 2)     Status: None   Collection Time    04/09/13  9:02 AM      Result Value Range Status   Specimen Description BLOOD ARM RIGHT   Final   Special Requests BOTTLES DRAWN AEROBIC ONLY   Final   Culture   Setup Time     Final   Value: 04/09/2013 17:07     Performed at [facility]   Culture     Final   Value:        BLOOD CULTURE RECEIVED NO GROWTH TO DATE CULTURE WILL BE HELD FOR 5 DAYS BEFORE ISSUING A FINAL NEGATIVE REPORT     Performed at [facility]   Report Status PENDING   Incomplete  MRSA PCR SCREENING     Status: None   Collection Time    04/09/13  3:05 PM      Result Value Range Status   MRSA by PCR NEGATIVE  NEGATIVE Final   Comment:            The GeneXpert MRSA Assay (FDA     approved for NASAL specimens     only), is one component of a     comprehensive MRSA colonization     surveillance program. It is not     intended to diagnose MRSA     infection nor to guide or     monitor treatment for     MRSA infections.  CULTURE, EXPECTORATED SPUTUM-ASSESSMENT     Status: None   Collection Time    04/09/13  5:18 PM      Result Value Range Status  Specimen Description SPUTUM   Final   Special Requests Normal   Final   Sputum evaluation     Final   Value: MICROSCOPIC FINDINGS SUGGEST THAT THIS SPECIMEN IS NOT REPRESENTATIVE OF LOWER RESPIRATORY SECRETIONS. PLEASE RECOLLECT.     CALLED TO RN S.MERLINI AT 0053 04/10/13 BY L.PITT   Report Status 04/10/2013 FINAL   Final  CULTURE, EXPECTORATED SPUTUM-ASSESSMENT     Status: None   Collection Time    04/10/13  3:04 AM      Result Value Range Status   Specimen Description SPUTUM   Final   Special Requests NONE   Final   Sputum evaluation     Final   Value: MICROSCOPIC FINDINGS SUGGEST THAT THIS SPECIMEN IS NOT REPRESENTATIVE OF LOWER RESPIRATORY SECRETIONS. PLEASE RECOLLECT.     CALLED TO MERLINI,S RN 04/10/2013 0336 JORDANS   Report Status 04/10/2013 FINAL   Final    Anti-infectives   Start     Dose/Rate Route Frequency Ordered Stop   04/11/13 2000  Ampicillin-Sulbactam (UNASYN) 3 g in sodium chloride 0.9 % 100 mL IVPB     3 g 100 mL/hr over 60 Minutes Intravenous Every 6 hours 04/11/13 1439     04/11/13  1500  vancomycin (VANCOCIN) IVPB 1000 mg/200 mL premix     1,000 mg 200 mL/hr over 60 Minutes Intravenous Every 12 hours 04/11/13 1441     04/10/13 1400  vancomycin (VANCOCIN) IVPB 1000 mg/200 mL premix  Status:  Discontinued     1,000 mg 200 mL/hr over 60 Minutes Intravenous Every 24 hours 04/09/13 1323 04/11/13 1441   04/09/13 2000  Ampicillin-Sulbactam (UNASYN) 3 g in sodium chloride 0.9 % 100 mL IVPB  Status:  Discontinued     3 g 100 mL/hr over 60 Minutes Intravenous Every 8 hours 04/09/13 1328 04/11/13 1439   04/09/13 1330  vancomycin (VANCOCIN) 1,250 mg in sodium chloride 0.9 % 250 mL IVPB     1,250 mg 166.7 mL/hr over 90 Minutes Intravenous STAT 04/09/13 1322 04/09/13 1538   04/09/13 1315  clindamycin (CLEOCIN) IVPB 600 mg     600 mg 100 mL/hr over 30 Minutes Intravenous  Once 04/09/13 1302 04/09/13 1359   04/09/13 0900  cefTRIAXone (ROCEPHIN) 1 g in dextrose 5 % 50 mL IVPB     1 g 100 mL/hr over 30 Minutes Intravenous  Once 04/09/13 0850 04/09/13 1042   04/09/13 0900  azithromycin (ZITHROMAX) 500 mg in dextrose 5 % 250 mL IVPB     500 mg 250 mL/hr over 60 Minutes Intravenous  Once 04/09/13 0850 04/09/13 1227      Assessment: 42 y/o female with a new diagnosis of HIV on day 3 vancomycin Monique Unasyn for PNA with lung abscess. She remains [name], Monique [name], Monique culture data is negative thus far. Renal function has improved Monique UOP is good.  Goal of Therapy:  Vancomycin trough level 15-20 mcg/ml  Plan:  -Increase vancomycin to 1000 mg IV q12h -Increase Unasyn to 3 g IV q6h -Monitor renal function, clinical course, Monique culture data  [facility], [place].D., BCPS Clinical Pharmacist Pager: [phone] 04/11/2013 2:44 PM

## 2013-04-12 LAB — QUANTIFERON TB GOLD ASSAY (BLOOD)
Interferon Gamma Release Assay: POSITIVE — AB
TB Antigen Minus Nil Value: 1.19 IU/mL

## 2013-04-12 MED ORDER — CANDESARTAN CILEXETIL-HCTZ 32-12.5 MG PO TABS: 1.0000 | ORAL_TABLET | Freq: Once | ORAL | Status: DC

## 2013-04-12 MED ORDER — HYDROCHLOROTHIAZIDE 12.5 MG PO CAPS
12.5000 mg | ORAL_CAPSULE | Freq: Every day | ORAL | Status: DC
  Administered 2013-04-12 – 2013-04-16 (×5): 12.5 mg via ORAL
  Filled 2013-04-12 (×5): qty 1

## 2013-04-12 MED ORDER — METOPROLOL TARTRATE 1 MG/ML IV SOLN
2.5000 mg | Freq: Once | INTRAVENOUS | Status: AC
  Administered 2013-04-12: 2.5 mg via INTRAVENOUS
  Filled 2013-04-12: qty 5

## 2013-04-12 MED ORDER — IRBESARTAN 300 MG PO TABS
300.0000 mg | ORAL_TABLET | Freq: Every day | ORAL | Status: DC
  Administered 2013-04-12 – 2013-04-16 (×5): 300 mg via ORAL
  Filled 2013-04-12 (×5): qty 1

## 2013-04-12 MED ORDER — LEVALBUTEROL HCL 0.63 MG/3ML IN NEBU
0.6300 mg | INHALATION_SOLUTION | Freq: Once | RESPIRATORY_TRACT | Status: AC
  Administered 2013-04-12: 0.63 mg via RESPIRATORY_TRACT
  Filled 2013-04-12: qty 3

## 2013-04-12 MED ORDER — HYDROCOD POLST-CHLORPHEN POLST 10-8 MG/5ML PO LQCR
5.0000 mL | Freq: Two times a day (BID) | ORAL | Status: DC | PRN
  Administered 2013-04-22: 5 mL via ORAL
  Filled 2013-04-12 (×2): qty 5

## 2013-04-12 NOTE — Progress Notes (Signed)
[facility]  Date of Admission:  04/09/2013  Antibiotics: Antibiotics Given (last 72 hours)   Date/Time Action Medication Dose Rate   04/09/13 2039 Given   Ampicillin-Sulbactam (UNASYN) 3 g in sodium chloride 0.9 % 100 mL IVPB 3 g 100 mL/hr   04/10/13 0408 Given   Ampicillin-Sulbactam (UNASYN) 3 g in sodium chloride 0.9 % 100 mL IVPB 3 g 100 mL/hr   04/10/13 1251 Given   Ampicillin-Sulbactam (UNASYN) 3 g in sodium chloride 0.9 % 100 mL IVPB 3 g 100 mL/hr   04/10/13 1406 Given   vancomycin (VANCOCIN) IVPB 1000 mg/200 mL premix 1,000 mg 200 mL/hr   04/10/13 2035 Given   Ampicillin-Sulbactam (UNASYN) 3 g in sodium chloride 0.9 % 100 mL IVPB 3 g 100 mL/hr   04/11/13 0325 Given   Ampicillin-Sulbactam (UNASYN) 3 g in sodium chloride 0.9 % 100 mL IVPB 3 g 100 mL/hr   04/11/13 1339 Given   Ampicillin-Sulbactam (UNASYN) 3 g in sodium chloride 0.9 % 100 mL IVPB 3 g 100 mL/hr   04/11/13 1445 Given   vancomycin (VANCOCIN) IVPB 1000 mg/200 mL premix 1,000 mg 200 mL/hr   04/11/13 2001 Given   Ampicillin-Sulbactam (UNASYN) 3 g in sodium chloride 0.9 % 100 mL IVPB 3 g 100 mL/hr   04/12/13 0259 Given   Ampicillin-Sulbactam (UNASYN) 3 g in sodium chloride 0.9 % 100 mL IVPB 3 g 100 mL/hr   04/12/13 0408 Given  [not in med room]   vancomycin (VANCOCIN) IVPB 1000 mg/200 mL premix 1,000 mg 200 mL/hr   04/12/13 1053 Given   Ampicillin-Sulbactam (UNASYN) 3 g in sodium chloride 0.9 % 100 mL IVPB 3 g 100 mL/hr      Subjective: No complaints, not symptomatically sob  Objective: Temp:  [98.7 F (37.1 C)-103.2 F (39.6 C)] 99.4 F (37.4 C) (08/17 1245) Pulse Rate:  [105-111] 111 (08/17 1245) Resp:  [17-33] 26 (08/17 1245) BP: (101-144)/(41-101) 144/101 mmHg (08/17 1245) SpO2:  [92 %-97 %] 97 % (08/17 1245) Weight:  [204 lb 5.9 oz (92.7 kg)] 204 lb 5.9 oz (92.7 kg) (08/17 0421)  General: Awake, alert, nad Skin: no rashes Lungs: decreased air entry, + wheeze mild  bilateral Cor: RRR without m Abdomen: soft, nt, nd, +bs Ext: no edema  Lab Results Lab Results  Component Value Date   WBC 12.4* 04/11/2013   HGB 9.6* 04/11/2013   HCT 26.6* 04/11/2013   MCV 81.1 04/11/2013   PLT 221 04/11/2013    Lab Results  Component Value Date   CREATININE 1.18* 04/11/2013   BUN 15 04/11/2013   NA 134* 04/11/2013   K 4.2 04/11/2013   CL 107 04/11/2013   CO2 17* 04/11/2013    Lab Results  Component Value Date   ALT 35 04/11/2013   AST 72* 04/11/2013   ALKPHOS 97 04/11/2013   BILITOT 0.3 04/11/2013      Microbiology: Recent Results (from the past 240 hour(s))  CULTURE, BLOOD (ROUTINE X 2)     Status: None   Collection Time    04/09/13  8:55 AM      Result Value Range Status   Specimen Description BLOOD RIGHT ANTECUBITAL   Final   Special Requests BOTTLES DRAWN AEROBIC ONLY   Final   Culture  Setup Time     Final   Value: 04/09/2013 17:08     Performed at [facility]   Culture     Final   Value:  BLOOD CULTURE RECEIVED NO GROWTH TO DATE CULTURE WILL BE HELD FOR 5 DAYS BEFORE ISSUING A FINAL NEGATIVE REPORT     Performed at [facility]   Report Status PENDING   Incomplete  CULTURE, BLOOD (ROUTINE X 2)     Status: None   Collection Time    04/09/13  9:02 AM      Result Value Range Status   Specimen Description BLOOD ARM RIGHT   Final   Special Requests BOTTLES DRAWN AEROBIC ONLY   Final   Culture  Setup Time     Final   Value: 04/09/2013 17:07     Performed at [facility]   Culture     Final   Value:        BLOOD CULTURE RECEIVED NO GROWTH TO DATE CULTURE WILL BE HELD FOR 5 DAYS BEFORE ISSUING A FINAL NEGATIVE REPORT     Performed at [facility]   Report Status PENDING   Incomplete  MRSA PCR SCREENING     Status: None   Collection Time    04/09/13  3:05 PM      Result Value Range Status   MRSA by PCR NEGATIVE  NEGATIVE Final   Comment:            The GeneXpert MRSA Assay (FDA     approved for NASAL  specimens     only), is one component of a     comprehensive MRSA colonization     surveillance program. It is not     intended to diagnose MRSA     infection nor to guide or     monitor treatment for     MRSA infections.  CULTURE, EXPECTORATED SPUTUM-ASSESSMENT     Status: None   Collection Time    04/09/13  5:18 PM      Result Value Range Status   Specimen Description SPUTUM   Final   Special Requests Normal   Final   Sputum evaluation     Final   Value: MICROSCOPIC FINDINGS SUGGEST THAT THIS SPECIMEN IS NOT REPRESENTATIVE OF LOWER RESPIRATORY SECRETIONS. PLEASE RECOLLECT.     CALLED TO RN S.MERLINI AT 0053 04/10/13 BY L.PITT   Report Status 04/10/2013 FINAL   Final  CULTURE, EXPECTORATED SPUTUM-ASSESSMENT     Status: None   Collection Time    04/10/13  3:04 AM      Result Value Range Status   Specimen Description SPUTUM   Final   Special Requests NONE   Final   Sputum evaluation     Final   Value: MICROSCOPIC FINDINGS SUGGEST THAT THIS SPECIMEN IS NOT REPRESENTATIVE OF LOWER RESPIRATORY SECRETIONS. PLEASE RECOLLECT.     CALLED TO MERLINI,S RN 04/10/2013 0336 JORDANS   Report Status 04/10/2013 FINAL   Final  AFB CULTURE WITH SMEAR     Status: None   Collection Time    04/10/13 11:00 AM      Result Value Range Status   Specimen Description SPUTUM   Final   Special Requests NONE   Final   ACID FAST SMEAR     Final   Value: NO ACID FAST BACILLI SEEN     Performed at [facility]   Culture     Final   Value: CULTURE WILL BE EXAMINED FOR 6 WEEKS BEFORE ISSUING A FINAL REPORT     Performed at [facility]   Report Status PENDING   Incomplete  AFB CULTURE WITH SMEAR  Status: None   Collection Time    04/11/13 11:42 AM      Result Value Range Status   Specimen Description SPUTUM   Final   Special Requests Immunocompromised   Final   ACID FAST SMEAR     Final   Value: NO ACID FAST BACILLI SEEN     Performed at [facility]   Culture     Final    Value: CULTURE WILL BE EXAMINED FOR 6 WEEKS BEFORE ISSUING A FINAL REPORT     Performed at [facility]   Report Status PENDING   Incomplete    Studies/Results: No results found.  Assessment/Plan: 1)  Lung abscess - negative culture to date.  Anaerobe vs MRSA.  On vancomycin and ancef.  No changes.   -AFB x 2 sent.  If 3 smears negative, can d/c isolation  2)  HIV - awaiting confirmation  Psalm Schappell, Molly Maduro, MD [facility] [email or web address] [phone] pager   [phone] cell 04/12/2013, 1:00 PM

## 2013-04-12 NOTE — Progress Notes (Signed)
[facility] Progress Note [place] TEAM 1 - Stepdown ICU Team   Monique [name] XBM:[id number] DOB: [birth year] DOA: 04/09/2013 PCP: Default, Provider, MD  Brief narrative: 42 year old female patient with only significant medical history of hypertension and tobacco abuse. Presented to the emergency department with subjective fevers and chills for 3 days and nonproductive cough. This is been associated with anorexia for one week. The evening prior to presentation she had been cleaning her house she developed sharp pain in her left chest that was quite severe and was associated with shortness of breath she had no other constitutional symptoms. In the emergency department the patient was tachycardic with a heart rate of 120 and respiratory rate of 25 she had a fever of 102.8 and her O2 sats are 90% on room air. She had mild leukocytosis with a white count of 13,700 hemoglobin was 10.4 sodium 125 potassium 2.6 bicarbonate 18 creatinine 1.65. Portable chest x-ray was concerning for a mass in the left upper lobe. CT angiogram of the chest revealed consolidation in the left upper lobe consistent with pneumonia and a possible central pulmonary abscess. No evidence of pulmonary embolus. Because of her symptoms and vital signs she was given 3 L of normal saline and started on IV Rocephin and azithromycin followed by a dose of IV clindamycin and was admitted to the step down unit.  Assessment/Plan: Active Problems:   Sepsis -due to pulmonary infection -follow up on all cx's -hemodynamically stable -TM since admission 105.2- fever's still occurring but much milder now    Acute respiratory failure with hypoxia/Pneumonia with lung abscess -still quite dyspneic off oxygen -concern given new dx HIV this may be opportunistic infection -ID following and managing anbx's- TB work up in process and HIV confirmatory studies in progress -Pulmonary consulted initially and following -AFB sputum x 3   -  Inteferon Quant is positive and therefore confirms prior exposure to TB    Acute kidney injury/ Dehydration with hyponatremia -due to [facility] - resolved and now edema in legs - d/c IVF and resume diuretics    Human immunodeficiency virus (HIV) disease -new dx- UNDER NO CIRCUMSTANCES DOES PT WANT THIS DIAGNOSIS DISCUSSED WITH ANY FRIENDS OR FAMILY!!! -work up per ID    Hypokalemia -oral repleted- resolved   DVT prophylaxis: Lovenox Code Status: Full Family Communication: Patient Disposition Plan/Expected LOS: Stepdown Isolation: Airborne until tuberculin status can be clarified Nutritional Status: Acute protein calorie malnutrition related to acute illness. Pending immunodeficiency workup patient may also be at risk for chronic protein calorie malnutrition related to increasing metabolic demands from immunodeficiency  Consultants: Pulmonary Infectious disease  Procedures: None  Antibiotics: Unasyn 8/14 >>> Azithromycin 8/14 >>> 8/15 Ceftriaxone 8/14 >>> 8/15 Clindamycin 8/14 >>> 8/15 Vancomycin 8/14 >>>  HPI/Subjective: Patient in better spirits today- cough continues, still having fevers  Objective: Blood pressure 166/102, pulse 121, temperature 100.7 F (38.2 C), temperature source Oral, resp. rate 26, height 5\' 2"  (1.575 m), weight 92.7 kg (204 lb 5.9 oz), last menstrual period 03/09/2013, SpO2 97.00%.  Intake/Output Summary (Last 24 hours) at 04/12/13 1827 Last data filed at 04/12/13 1300  Gross per 24 hour  Intake   3000 ml  Output   1400 ml  Net   1600 ml     Exam: General: No acute respiratory distress Lungs: decreased BS in left lung field, 2 L nasal cannula oxygen, mild tachypnea Cardiovascular: Regular rate/mildly tachycardic without murmur gallop or rub normal S1 and S2, no peripheral edema or JVD Abdomen:  Nontender, nondistended, soft, bowel sounds positive, no rebound, no ascites, no appreciable mass Musculoskeletal: No significant cyanosis, clubbing of  bilateral lower extremities Neurological: Alert and oriented x 3, moves all extremities x 4 without focal neurological deficits, CN 2-12 intact  Scheduled Meds:  Scheduled Meds: . ampicillin-sulbactam (UNASYN) IV  3 g Intravenous Q6H  . benzonatate  100 mg Oral TID  . dextromethorphan  30 mg Oral BID  . enoxaparin (LOVENOX) injection  40 mg Subcutaneous Q24H  . irbesartan  300 mg Oral Daily   And  . hydrochlorothiazide  12.5 mg Oral Daily  . sodium chloride  3 mL Intravenous Q12H  . vancomycin  1,000 mg Intravenous Q12H   Continuous Infusions:    **Reviewed in detail by the Attending Physicia  Data Reviewed: Basic Metabolic Panel:  Recent Labs Lab 04/09/13 0901 04/09/13 1530 04/10/13 0535 04/11/13 0500  NA 125* 128* 132* 134*  K 2.6* 3.6 4.0 4.2  CL 95* 98 105 107  CO2 18* 18* 16* 17*  GLUCOSE 129* 93 100* 121*  BUN 17 15 18 15   CREATININE 1.65* 1.48* 1.45* 1.18*  CALCIUM 8.6 8.0* 7.7* 8.2*  MG 1.6  --   --   --    Liver Function Tests:  Recent Labs Lab 04/09/13 0901 04/11/13 0500  AST 38* 72*  ALT 22 35  ALKPHOS 110 97  BILITOT 0.6 0.3  PROT 7.6 6.8  ALBUMIN 2.2* 1.7*   No results found for this basename: LIPASE, AMYLASE,  in the last 168 hours No results found for this basename: AMMONIA,  in the last 168 hours CBC:  Recent Labs Lab 04/09/13 0901 04/09/13 1530 04/10/13 0535 04/11/13 0500  WBC 13.7* 15.3* 12.4* 12.4*  NEUTROABS 11.4*  --   --   --   HGB 10.4* 10.8* 9.5* 9.6*  HCT 28.3* 29.5* 26.9* 26.6*  MCV 81.6 82.2 82.3 81.1  PLT 190 199 191 221   Cardiac Enzymes: No results found for this basename: CKTOTAL, CKMB, CKMBINDEX, TROPONINI,  in the last 168 hours BNP (last 3 results) No results found for this basename: PROBNP,  in the last 8760 hours CBG: No results found for this basename: GLUCAP,  in the last 168 hours  Recent Results (from the past 240 hour(s))  CULTURE, BLOOD (ROUTINE X 2)     Status: None   Collection Time    04/09/13   8:55 AM      Result Value Range Status   Specimen Description BLOOD RIGHT ANTECUBITAL   Final   Special Requests BOTTLES DRAWN AEROBIC ONLY   Final   Culture  Setup Time     Final   Value: 04/09/2013 17:08     Performed at [facility]   Culture     Final   Value:        BLOOD CULTURE RECEIVED NO GROWTH TO DATE CULTURE WILL BE HELD FOR 5 DAYS BEFORE ISSUING A FINAL NEGATIVE REPORT     Performed at [facility]   Report Status PENDING   Incomplete  CULTURE, BLOOD (ROUTINE X 2)     Status: None   Collection Time    04/09/13  9:02 AM      Result Value Range Status   Specimen Description BLOOD ARM RIGHT   Final   Special Requests BOTTLES DRAWN AEROBIC ONLY   Final   Culture  Setup Time     Final   Value: 04/09/2013 17:07     Performed  at [facility]     Final   Value:        BLOOD CULTURE RECEIVED NO GROWTH TO DATE CULTURE WILL BE HELD FOR 5 DAYS BEFORE ISSUING A FINAL NEGATIVE REPORT     Performed at [facility]   Report Status PENDING   Incomplete  MRSA PCR SCREENING     Status: None   Collection Time    04/09/13  3:05 PM      Result Value Range Status   MRSA by PCR NEGATIVE  NEGATIVE Final   Comment:            The GeneXpert MRSA Assay (FDA     approved for NASAL specimens     only), is one component of a     comprehensive MRSA colonization     surveillance program. It is not     intended to diagnose MRSA     infection nor to guide or     monitor treatment for     MRSA infections.  CULTURE, EXPECTORATED SPUTUM-ASSESSMENT     Status: None   Collection Time    04/09/13  5:18 PM      Result Value Range Status   Specimen Description SPUTUM   Final   Special Requests Normal   Final   Sputum evaluation     Final   Value: MICROSCOPIC FINDINGS SUGGEST THAT THIS SPECIMEN IS NOT REPRESENTATIVE OF LOWER RESPIRATORY SECRETIONS. PLEASE RECOLLECT.     CALLED TO RN S.MERLINI AT 0053 04/10/13 BY L.PITT   Report Status 04/10/2013  FINAL   Final  CULTURE, EXPECTORATED SPUTUM-ASSESSMENT     Status: None   Collection Time    04/10/13  3:04 AM      Result Value Range Status   Specimen Description SPUTUM   Final   Special Requests NONE   Final   Sputum evaluation     Final   Value: MICROSCOPIC FINDINGS SUGGEST THAT THIS SPECIMEN IS NOT REPRESENTATIVE OF LOWER RESPIRATORY SECRETIONS. PLEASE RECOLLECT.     CALLED TO MERLINI,S RN 04/10/2013 0336 JORDANS   Report Status 04/10/2013 FINAL   Final  AFB CULTURE WITH SMEAR     Status: None   Collection Time    04/10/13 11:00 AM      Result Value Range Status   Specimen Description SPUTUM   Final   Special Requests NONE   Final   ACID FAST SMEAR     Final   Value: NO ACID FAST BACILLI SEEN     Performed at [facility]   Culture     Final   Value: CULTURE WILL BE EXAMINED FOR 6 WEEKS BEFORE ISSUING A FINAL REPORT     Performed at [facility]   Report Status PENDING   Incomplete  AFB CULTURE WITH SMEAR     Status: None   Collection Time    04/11/13 11:42 AM      Result Value Range Status   Specimen Description SPUTUM   Final   Special Requests Immunocompromised   Final   ACID FAST SMEAR     Final   Value: NO ACID FAST BACILLI SEEN     Performed at [facility]   Culture     Final   Value: CULTURE WILL BE EXAMINED FOR 6 WEEKS BEFORE ISSUING A FINAL REPORT     Performed at [facility]   Report Status PENDING   Incomplete  Studies:  Recent x-ray studies have been reviewed in detail by the Attending Physician    Calvert Cantor, MD [phone]  **If unable to reach the above provider after paging please contact the Flow Manager @ [phone]  On-Call/Text Page:      [email or web address]      password [email or web address]  If 7PM-7AM, please contact night-coverage [email or web address] Password [facility] 04/12/2013, 6:27 PM   LOS: 3 days

## 2013-04-13 ENCOUNTER — Encounter (HOSPITAL_COMMUNITY)
Admission: EM | Disposition: A | Discharge status: Home / Self Care | Payer: Self-pay | Source: Home / Self Care | Attending: Internal Medicine

## 2013-04-13 ENCOUNTER — Inpatient Hospital Stay (HOSPITAL_COMMUNITY): Payer: Self-pay

## 2013-04-13 HISTORY — PX: VIDEO BRONCHOSCOPY: SHX5072

## 2013-04-13 LAB — CBC
HCT: 25.7 % — ABNORMAL LOW (ref 36.0–46.0)
MCHC: 35.8 g/dL (ref 30.0–36.0)
MCV: 80.3 fL (ref 78.0–100.0)
RDW: 13.1 % (ref 11.5–15.5)

## 2013-04-13 LAB — CD4/CD8 (T-HELPER/T-SUPPRESSOR CELL)
CD4%: 14 % — ABNORMAL LOW (ref 30.0–60.0)
Ratio: 0.23 — ABNORMAL LOW (ref 1.0–3.0)
Total lymphocyte count: 500 /uL — ABNORMAL LOW (ref 1000–4000)

## 2013-04-13 LAB — HEPATITIS PANEL, ACUTE
HCV Ab: NEGATIVE
Hep A IgM: NEGATIVE
Hep B C IgM: NEGATIVE

## 2013-04-13 LAB — BASIC METABOLIC PANEL
BUN: 12 mg/dL (ref 6–23)
Chloride: 103 mEq/L (ref 96–112)
Creatinine, Ser: 1.21 mg/dL — ABNORMAL HIGH (ref 0.50–1.10)
GFR calc Af Amer: 63 mL/min — ABNORMAL LOW (ref >90)

## 2013-04-13 SURGERY — VIDEO BRONCHOSCOPY WITHOUT FLUORO
Anesthesia: Moderate Sedation | Laterality: Bilateral

## 2013-04-13 MED ORDER — SULFAMETHOXAZOLE-TMP DS 800-160 MG PO TABS
1.0000 | ORAL_TABLET | Freq: Every day | ORAL | Status: DC
  Administered 2013-04-13 – 2013-04-22 (×10): 1 via ORAL
  Filled 2013-04-13 (×11): qty 1

## 2013-04-13 MED ORDER — FENTANYL CITRATE 0.05 MG/ML IJ SOLN
INTRAMUSCULAR | Status: DC | PRN
  Administered 2013-04-13: 50 ug via INTRAVENOUS
  Administered 2013-04-13: 25 ug via INTRAVENOUS

## 2013-04-13 MED ORDER — PHENYLEPHRINE HCL 0.25 % NA SOLN
NASAL | Status: DC | PRN
  Administered 2013-04-13: 2 via NASAL

## 2013-04-13 MED ORDER — MIDAZOLAM HCL 5 MG/ML IJ SOLN
INTRAMUSCULAR | Status: DC | PRN
  Administered 2013-04-13: 3 mg via INTRAVENOUS

## 2013-04-13 MED ORDER — LIDOCAINE HCL 2 % EX GEL
CUTANEOUS | Status: DC | PRN
  Administered 2013-04-13: 1

## 2013-04-13 MED ORDER — SODIUM CHLORIDE 0.9 % IV BOLUS (SEPSIS)
500.0000 mL | Freq: Once | INTRAVENOUS | Status: AC
  Administered 2013-04-13: 500 mL via INTRAVENOUS

## 2013-04-13 MED ORDER — LIDOCAINE HCL 1 % IJ SOLN
INTRAMUSCULAR | Status: DC | PRN
  Administered 2013-04-13: 6 mL

## 2013-04-13 MED ORDER — LIDOCAINE HCL 2 % EX GEL
Freq: Once | CUTANEOUS | Status: DC
  Filled 2013-04-13: qty 5

## 2013-04-13 MED ORDER — POTASSIUM CHLORIDE CRYS ER 20 MEQ PO TBCR
40.0000 meq | EXTENDED_RELEASE_TABLET | Freq: Once | ORAL | Status: AC
  Administered 2013-04-13: 40 meq via ORAL
  Filled 2013-04-13: qty 2

## 2013-04-13 MED ORDER — PHENYLEPHRINE HCL 0.25 % NA SOLN
1.0000 | Freq: Four times a day (QID) | NASAL | Status: DC | PRN
  Filled 2013-04-13: qty 15

## 2013-04-13 NOTE — Progress Notes (Signed)
Rechecked temp 99.2,will continue to monitor.

## 2013-04-13 NOTE — Progress Notes (Signed)
INFECTIOUS DISEASE ATTENDING ADDENDUM:     [facility]   Date: 04/13/2013  Patient name: Monique [name]  Medical record number: [id number]  Date of birth: 08-30-1970    This patient has been seen and discussed with the house staff. Please see their note for complete details. I concur with their findings with the following additions/corrections:   SEe my separate note. GREATLY APPRECAITE CCM WILLING TO PEFORM BRONCHOSCOPY WITH BAL FOR AFB, BACTERIAL FUNGAL CULTURES, PCP SMEAR  Continue Airborne until I have given final word. Concern for TB has been HEIGHTENED with pt having HIV and positive QF gold  Paulette Blanch Dam 04/13/2013, 5:51 PM

## 2013-04-13 NOTE — Progress Notes (Signed)
Patient ID: Monique [name]  female  ZOX:[id number]    DOB: 06/27/1971    DOA: 04/09/2013  PCP: Default, Provider, MD  Assessment/Plan:   Sepsis : Likely due to lung abscess and pneumonia -3 AFB's are negative, blood cultures negative. Afebrile this morning -hemodynamically stable. Continue Unasyn  -Per ID, continue airborne precautions even if there is negative sputum   Acute respiratory failure with hypoxia/Pneumonia with lung abscess: Still on 4 L O2 via nasal cannula  -3 AFB's are negative, concern given new dx HIV this may be opportunistic infection  -ID and pulmonology following. Reviewed Dr. Lattie Haw note today, still concerns for possibility of pulmonary TB and with the possibility of HIV, the probability is high. - Inteferon Quant is positive and confirms prior exposure to TB  - Discussed with Dr. Marchelle Gearing, Bronch today at 4 PM  Acute kidney injury/ Dehydration with hyponatremia  -Improving  Human immunodeficiency virus (HIV) disease - Awaiting confirmation, new diagnosis  - PT DOES NOT WANT THIS DIAGNOSIS DISCUSSED WITH ANY FRIENDS OR FAMILY.  Hypokalemia  - replaced  DVT Prophylaxis: Lovenox  Code Status: Full code  Disposition:    Subjective: Ambulating in the room, still coughing also feels better today  Objective: Weight change:   Intake/Output Summary (Last 24 hours) at 04/13/13 1246 Last data filed at 04/12/13 1300  Gross per 24 hour  Intake    200 ml  Output      0 ml  Net    200 ml   Blood pressure 113/70, pulse 92, temperature 97.6 F (36.4 C), temperature source Oral, resp. rate 18, height 5\' 2"  (1.575 m), weight 92.7 kg (204 lb 5.9 oz), last menstrual period 03/09/2013, SpO2 97.00%.  Physical Exam: General: Alert and awake, oriented x3, not in any acute distress. CVS: S1-S2 clear, no murmur rubs or gallops Chest: Coarse breath sounds bilaterally Abdomen: soft nontender, nondistended, normal bowel sounds  Extremities: no cyanosis, clubbing  or edema noted bilaterally   Lab Results: Basic Metabolic Panel:  Recent Labs Lab 04/09/13 0901  04/11/13 0500 04/13/13 0525  NA 125*  < > 134* 132*  K 2.6*  < > 4.2 3.4*  CL 95*  < > 107 103  CO2 18*  < > 17* 18*  GLUCOSE 129*  < > 121* 123*  BUN 17  < > 15 12  CREATININE 1.65*  < > 1.18* 1.21*  CALCIUM 8.6  < > 8.2* 8.6  MG 1.6  --   --   --   < > = values in this interval not displayed. Liver Function Tests:  Recent Labs Lab 04/09/13 0901 04/11/13 0500  AST 38* 72*  ALT 22 35  ALKPHOS 110 97  BILITOT 0.6 0.3  PROT 7.6 6.8  ALBUMIN 2.2* 1.7*   No results found for this basename: LIPASE, AMYLASE,  in the last 168 hours No results found for this basename: AMMONIA,  in the last 168 hours CBC:  Recent Labs Lab 04/09/13 0901  04/11/13 0500 04/13/13 0525  WBC 13.7*  < > 12.4* 19.7*  NEUTROABS 11.4*  --   --   --   HGB 10.4*  < > 9.6* 9.2*  HCT 28.3*  < > 26.6* 25.7*  MCV 81.6  < > 81.1 80.3  PLT 190  < > 221 279  < > = values in this interval not displayed. Cardiac Enzymes: No results found for this basename: CKTOTAL, CKMB, CKMBINDEX, TROPONINI,  in the last 168 hours BNP: No components  found with this basename: POCBNP,  CBG: No results found for this basename: GLUCAP,  in the last 168 hours   Micro Results: Recent Results (from the past 240 hour(s))  CULTURE, BLOOD (ROUTINE X 2)     Status: None   Collection Time    04/09/13  8:55 AM      Result Value Range Status   Specimen Description BLOOD RIGHT ANTECUBITAL   Final   Special Requests BOTTLES DRAWN AEROBIC ONLY   Final   Culture  Setup Time     Final   Value: 04/09/2013 17:08     Performed at [facility]   Culture     Final   Value:        BLOOD CULTURE RECEIVED NO GROWTH TO DATE CULTURE WILL BE HELD FOR 5 DAYS BEFORE ISSUING A FINAL NEGATIVE REPORT     Performed at [facility]   Report Status PENDING   Incomplete  CULTURE, BLOOD (ROUTINE X 2)     Status: None    Collection Time    04/09/13  9:02 AM      Result Value Range Status   Specimen Description BLOOD ARM RIGHT   Final   Special Requests BOTTLES DRAWN AEROBIC ONLY   Final   Culture  Setup Time     Final   Value: 04/09/2013 17:07     Performed at [facility]   Culture     Final   Value:        BLOOD CULTURE RECEIVED NO GROWTH TO DATE CULTURE WILL BE HELD FOR 5 DAYS BEFORE ISSUING A FINAL NEGATIVE REPORT     Performed at [facility]   Report Status PENDING   Incomplete  MRSA PCR SCREENING     Status: None   Collection Time    04/09/13  3:05 PM      Result Value Range Status   MRSA by PCR NEGATIVE  NEGATIVE Final   Comment:            The GeneXpert MRSA Assay (FDA     approved for NASAL specimens     only), is one component of a     comprehensive MRSA colonization     surveillance program. It is not     intended to diagnose MRSA     infection nor to guide or     monitor treatment for     MRSA infections.  CULTURE, EXPECTORATED SPUTUM-ASSESSMENT     Status: None   Collection Time    04/09/13  5:18 PM      Result Value Range Status   Specimen Description SPUTUM   Final   Special Requests Normal   Final   Sputum evaluation     Final   Value: MICROSCOPIC FINDINGS SUGGEST THAT THIS SPECIMEN IS NOT REPRESENTATIVE OF LOWER RESPIRATORY SECRETIONS. PLEASE RECOLLECT.     CALLED TO RN S.MERLINI AT 0053 04/10/13 BY L.PITT   Report Status 04/10/2013 FINAL   Final  CULTURE, EXPECTORATED SPUTUM-ASSESSMENT     Status: None   Collection Time    04/10/13  3:04 AM      Result Value Range Status   Specimen Description SPUTUM   Final   Special Requests NONE   Final   Sputum evaluation     Final   Value: MICROSCOPIC FINDINGS SUGGEST THAT THIS SPECIMEN IS NOT REPRESENTATIVE OF LOWER RESPIRATORY SECRETIONS. PLEASE RECOLLECT.     CALLED TO MERLINI,S RN 04/10/2013 [phone]  J[facility]ANS   Report Status 04/10/2013 FINAL   Final  AFB CULTURE WITH SMEAR     Status: None   Collection Time     04/10/13 11:00 AM      Result Value Range Status   Specimen Description SPUTUM   Final   Special Requests NONE   Final   ACID FAST SMEAR     Final   Value: NO ACID FAST BACILLI SEEN     Performed at [facility]   Culture     Final   Value: CULTURE WILL BE EXAMINED FOR 6 WEEKS BEFORE ISSUING A FINAL REPORT     Performed at [facility]   Report Status PENDING   Incomplete  AFB CULTURE WITH SMEAR     Status: None   Collection Time    04/11/13 11:42 AM      Result Value Range Status   Specimen Description SPUTUM   Final   Special Requests Immunocompromised   Final   ACID FAST SMEAR     Final   Value: NO ACID FAST BACILLI SEEN     Performed at [facility]   Culture     Final   Value: CULTURE WILL BE EXAMINED FOR 6 WEEKS BEFORE ISSUING A FINAL REPORT     Performed at [facility]   Report Status PENDING   Incomplete  AFB CULTURE WITH SMEAR     Status: None   Collection Time    04/12/13  3:04 PM      Result Value Range Status   Specimen Description NASOPHARYNGEAL   Final   Special Requests Immunocompromised   Final   ACID FAST SMEAR     Final   Value: NO ACID FAST BACILLI SEEN     Performed at [facility]   Culture     Final   Value: CULTURE WILL BE EXAMINED FOR 6 WEEKS BEFORE ISSUING A FINAL REPORT     Performed at [facility]   Report Status PENDING   Incomplete    Studies/Results: Ct Angio Chest Pe W/cm &/or Wo Cm  04/09/2013   *RADIOLOGY REPORT*  Clinical Data: Left upper chest pain and shortness of breath. Nonproductive cough and fever.  CT ANGIOGRAPHY CHEST  Technique:  Multidetector CT imaging of the chest using the standard protocol during bolus administration of intravenous contrast. Multiplanar reconstructed images including MIPs were obtained and reviewed to evaluate the vascular anatomy.  Contrast: OMNIPAQUE IOHEXOL 350 MG/ML SOLN  Comparison: Chest radiograph 04/09/2013.  Findings: No pulmonary embolus.   Mediastinal lymph nodes measure up to 9 mm in the low right paratracheal station.  Axillary lymph nodes measure up to 13 mm on the left.  Heart is at the upper limits of normal in size to mildly enlarged.  No pericardial effusion.  Trace left pleural fluid.  Large area of airspace consolidation and surrounding inflammatory change in the left upper lobe with internal low attenuation and air.  Airway remains grossly patent. Minimal scattered peribronchovascular nodularity in the right lung.  Incidental imaging of the upper abdomen shows no acute findings. No worrisome lytic or sclerotic lesions.  IMPRESSION:  1.  Negative for pulmonary embolus. 2.  Consolidative findings in the left upper lobe are most consistent with pneumonia and associated central abscess. Follow-up to clearing is recommended as malignancy cannot be definitively excluded.  These results were called by telephone on 04/09/2013 at 1200 hours to Dr. Rubin Payor, who verbally acknowledged these results. 3.  Mediastinal and axillary lymph nodes may be reactive. 4.  Trace left pleural fluid.   Original Report Authenticated By: Leanna Battles, M.D.   Dg Chest Portable 1 View  04/09/2013   *RADIOLOGY REPORT*  Clinical Data: Chest pain  PORTABLE CHEST - 1 VIEW  Comparison: July 03, 2007  Findings:  There is a large area of consolidation with questionable mass in the left upper lobe.  This area of opacity measures 10.1 x 6.3 cm.  Right lung is clear.  Heart is mildly enlarged with normal pulmonary vascularity.  No adenopathy.  IMPRESSION: Consolidation with questionable mass left upper lobe.  Advise correlation with contrast enhanced chest CT to further evaluate. Right lung clear.  Mild cardiomegaly.   Original Report Authenticated By: Bretta Bang, M.D.    Medications: Scheduled Meds: . ampicillin-sulbactam (UNASYN) IV  3 g Intravenous Q6H  . benzonatate  100 mg Oral TID  . dextromethorphan  30 mg Oral BID  . enoxaparin (LOVENOX) injection   40 mg Subcutaneous Q24H  . irbesartan  300 mg Oral Daily   And  . hydrochlorothiazide  12.5 mg Oral Daily  . sodium chloride  3 mL Intravenous Q12H      LOS: 4 days   Jessah Danser M.D. [facility] 04/13/2013, 12:46 PM Pager: [phone]  If 7PM-7AM, please contact night-coverage [email or web address] Password [email or web address]

## 2013-04-13 NOTE — Progress Notes (Signed)
Notified Md of pt T 102.9,new orders received.

## 2013-04-13 NOTE — Op Note (Signed)
Name:  [name] MRN:  [id number] DOB:  August 15, 1971  PROCEDURE NOTE  Procedure(s): Flexible bronchoscopy [id number]) Bronchial alveolar lavage [phone] of the LEFT UPPER LOBE   Indications:  HIV, Quantiferon Gold positive,  LUL Pneumonia  Consent:  Procedure, benefits, risks and alternatives discussed.  Questions answered.  Consent obtained.  Anesthesia:  General endotracheal.  Procedure summary:  Appropriate equipment was assembled.  The patient was brought to the endoscopy suite and identified as Monique [name].  Safety timeout was performed. The patient was placed supine on the operating table, and modereate sedation administered.   After the appropriate level of moderate sedation was assured, flexible video bronchoscope was lubricated and inserted through the endotracheal tube.  Total of 16 mL of 1% Lidocaine were administered through the bronchoscope to augment moderate sedation  Airway examination was NOT performed due to indication being BAL of LUL   Bronchial alveolar lavage of the LEFT UPPER LOBE  was performed with 120 mL of normal saline and return of 75 mL of greyish brown  fluid, after which the bronchoscope was withdrawn.   Specimens sent: Bronchial alveolar lavage specimen of the  LUL for Virus, AFB smea, culture, DNA probe, fungus, PCP, cell count, gram stain and cytology  Complications:  No immediate complications were noted.  Hemodynamic parameters and oxygenation remained stable throughout the procedure.  Estimated blood loss: None   Dr. Kalman Shan, M.D., [facility].C.P Pulmonary and Critical Care Medicine Staff Physician [facility] [place] Pager: [phone], If no answer or between  15:00h - 7:00h: call [phone]  04/13/2013 5:42 PM

## 2013-04-13 NOTE — Interval H&P Note (Signed)
See earlier note today

## 2013-04-13 NOTE — Progress Notes (Signed)
[facility]    Subjective: Patient has no new complaints this am. She states her pain in her chest has improved steadily and her coughing has decreased and is no longer productive. She denies any fevers, chills, sweats, N/V/D, constipation.    Antibiotics:  Anti-infectives   Start     Dose/Rate Route Frequency Ordered Stop   04/11/13 2000  Ampicillin-Sulbactam (UNASYN) 3 g in sodium chloride 0.9 % 100 mL IVPB     3 g 100 mL/hr over 60 Minutes Intravenous Every 6 hours 04/11/13 1439     04/11/13 1500  vancomycin (VANCOCIN) IVPB 1000 mg/200 mL premix     1,000 mg 200 mL/hr over 60 Minutes Intravenous Every 12 hours 04/11/13 1441     04/10/13 1400  vancomycin (VANCOCIN) IVPB 1000 mg/200 mL premix  Status:  Discontinued     1,000 mg 200 mL/hr over 60 Minutes Intravenous Every 24 hours 04/09/13 1323 04/11/13 1441   04/09/13 2000  Ampicillin-Sulbactam (UNASYN) 3 g in sodium chloride 0.9 % 100 mL IVPB  Status:  Discontinued     3 g 100 mL/hr over 60 Minutes Intravenous Every 8 hours 04/09/13 1328 04/11/13 1439   04/09/13 1330  vancomycin (VANCOCIN) 1,250 mg in sodium chloride 0.9 % 250 mL IVPB     1,250 mg 166.7 mL/hr over 90 Minutes Intravenous STAT 04/09/13 1322 04/09/13 1538   04/09/13 1315  clindamycin (CLEOCIN) IVPB 600 mg     600 mg 100 mL/hr over 30 Minutes Intravenous  Once 04/09/13 1302 04/09/13 1359   04/09/13 0900  cefTRIAXone (ROCEPHIN) 1 g in dextrose 5 % 50 mL IVPB     1 g 100 mL/hr over 30 Minutes Intravenous  Once 04/09/13 0850 04/09/13 1042   04/09/13 0900  azithromycin (ZITHROMAX) 500 mg in dextrose 5 % 250 mL IVPB     500 mg 250 mL/hr over 60 Minutes Intravenous  Once 04/09/13 0850 04/09/13 1227      Medications: Scheduled Meds: . ampicillin-sulbactam (UNASYN) IV  3 g Intravenous Q6H  . benzonatate  100 mg Oral TID  . dextromethorphan  30 mg Oral BID  . enoxaparin (LOVENOX) injection  40 mg Subcutaneous Q24H  . irbesartan  300 mg  Oral Daily   And  . hydrochlorothiazide  12.5 mg Oral Daily  . sodium chloride  3 mL Intravenous Q12H  . vancomycin  1,000 mg Intravenous Q12H   Continuous Infusions:  PRN Meds:.acetaminophen, acetaminophen, chlorpheniramine-HYDROcodone, ibuprofen, oxyCODONE   Objective: Weight change:   Intake/Output Summary (Last 24 hours) at 04/13/13 0921 Last data filed at 04/12/13 1300  Gross per 24 hour  Intake    200 ml  Output      0 ml  Net    200 ml   Blood pressure 113/70, pulse 92, temperature 97.6 F (36.4 C), temperature source Oral, resp. rate 18, height 5\' 2"  (1.575 m), weight 92.7 kg (204 lb 5.9 oz), last menstrual period 03/09/2013, SpO2 97.00%. Temp:  [97.6 F (36.4 C)-102.9 F (39.4 C)] 97.6 F (36.4 C) (08/18 0651) Pulse Rate:  [92-121] 92 (08/18 0651) Resp:  [18-26] 18 (08/18 0651) BP: (113-166)/(70-102) 113/70 mmHg (08/18 0651) SpO2:  [97 %-99 %] 97 % (08/18 0651)  Physical Exam: General: Alert and awake, oriented x3, not in any acute distress. HEENT: anicteric sclera, pupils reactive to light and accommodation, EOMI CVS regular rate, normal r,  no murmur rubs or gallops Chest: decreased breath sounds LUL Abdomen: soft, nontender, nondistended, normal  bowel sounds Extremities: 1+ pitting edema B/L LE Skin: no rashes Lymph: no new lymphadenopathy Neuro: nonfocal  Lab Results:  Recent Labs  04/11/13 0500 04/13/13 0525  WBC 12.4* 19.7*  HGB 9.6* 9.2*  HCT 26.6* 25.7*  PLT 221 279    BMET  Recent Labs  04/11/13 0500 04/13/13 0525  NA 134* 132*  K 4.2 3.4*  CL 107 103  CO2 17* 18*  GLUCOSE 121* 123*  BUN 15 12  CREATININE 1.18* 1.21*  CALCIUM 8.2* 8.6    Micro Results: Recent Results (from the past 240 hour(s))  CULTURE, BLOOD (ROUTINE X 2)     Status: None   Collection Time    04/09/13  8:55 AM      Result Value Range Status   Specimen Description BLOOD RIGHT ANTECUBITAL   Final   Special Requests BOTTLES DRAWN AEROBIC ONLY   Final     Culture  Setup Time     Final   Value: 04/09/2013 17:08     Performed at [facility]   Culture     Final   Value:        BLOOD CULTURE RECEIVED NO GROWTH TO DATE CULTURE WILL BE HELD FOR 5 DAYS BEFORE ISSUING A FINAL NEGATIVE REPORT     Performed at [facility]   Report Status PENDING   Incomplete  CULTURE, BLOOD (ROUTINE X 2)     Status: None   Collection Time    04/09/13  9:02 AM      Result Value Range Status   Specimen Description BLOOD ARM RIGHT   Final   Special Requests BOTTLES DRAWN AEROBIC ONLY   Final   Culture  Setup Time     Final   Value: 04/09/2013 17:07     Performed at [facility]   Culture     Final   Value:        BLOOD CULTURE RECEIVED NO GROWTH TO DATE CULTURE WILL BE HELD FOR 5 DAYS BEFORE ISSUING A FINAL NEGATIVE REPORT     Performed at [facility]   Report Status PENDING   Incomplete  MRSA PCR SCREENING     Status: None   Collection Time    04/09/13  3:05 PM      Result Value Range Status   MRSA by PCR NEGATIVE  NEGATIVE Final   Comment:            The GeneXpert MRSA Assay (FDA     approved for NASAL specimens     only), is one component of a     comprehensive MRSA colonization     surveillance program. It is not     intended to diagnose MRSA     infection nor to guide or     monitor treatment for     MRSA infections.  CULTURE, EXPECTORATED SPUTUM-ASSESSMENT     Status: None   Collection Time    04/09/13  5:18 PM      Result Value Range Status   Specimen Description SPUTUM   Final   Special Requests Normal   Final   Sputum evaluation     Final   Value: MICROSCOPIC FINDINGS SUGGEST THAT THIS SPECIMEN IS NOT REPRESENTATIVE OF LOWER RESPIRATORY SECRETIONS. PLEASE RECOLLECT.     CALLED TO RN S.MERLINI AT 0053 04/10/13 BY L.PITT   Report Status 04/10/2013 FINAL   Final  CULTURE, EXPECTORATED SPUTUM-ASSESSMENT     Status: None   Collection  Time    04/10/13  3:04 AM      Result Value Range Status   Specimen  Description SPUTUM   Final   Special Requests NONE   Final   Sputum evaluation     Final   Value: MICROSCOPIC FINDINGS SUGGEST THAT THIS SPECIMEN IS NOT REPRESENTATIVE OF LOWER RESPIRATORY SECRETIONS. PLEASE RECOLLECT.     CALLED TO MERLINI,S RN 04/10/2013 0336 JORDANS   Report Status 04/10/2013 FINAL   Final  AFB CULTURE WITH SMEAR     Status: None   Collection Time    04/10/13 11:00 AM      Result Value Range Status   Specimen Description SPUTUM   Final   Special Requests NONE   Final   ACID FAST SMEAR     Final   Value: NO ACID FAST BACILLI SEEN     Performed at [facility]   Culture     Final   Value: CULTURE WILL BE EXAMINED FOR 6 WEEKS BEFORE ISSUING A FINAL REPORT     Performed at [facility]   Report Status PENDING   Incomplete  AFB CULTURE WITH SMEAR     Status: None   Collection Time    04/11/13 11:42 AM      Result Value Range Status   Specimen Description SPUTUM   Final   Special Requests Immunocompromised   Final   ACID FAST SMEAR     Final   Value: NO ACID FAST BACILLI SEEN     Performed at [facility]   Culture     Final   Value: CULTURE WILL BE EXAMINED FOR 6 WEEKS BEFORE ISSUING A FINAL REPORT     Performed at [facility]   Report Status PENDING   Incomplete    Studies/Results: No results found.    Assessment/Plan: Monique [name] is a 42 y.o. female with HTN who presented with fevers/chills, left sided substernal chest pain TTP. Chest CT consistent with pneumonia and LUL abscess with need for f/u to confirm clearance and exclude any malignancy. HIV Ab reactive and she has been placed on airborne precautions until TB workup concludes.   Pneumonia, with abscess:  --d/c vanc --continue unasyn per pharmacy for narrowed coverage, appreciate pharmacist assistance in the management of this patient  --f/u blood cx, NGTD --sputum culture, pending --will continue to follow abscess response to abx and consider needle  aspiration if needed   HIV: New diagnosis this admission. Wishes for NO INFORMATION TO BE DISCUSSED WITH FAMILY PRESENT --HIV Ab retest, positive --HIV 1 RNA, pending --CD4 = 70, will need PCP prophylaxis (CD4<200), would start bactrim pending bronch results --Crypto Ag, neg --HepC Ab, neg -- Acute Hep panel, pending  --HLA B5701, pending   Rule out TB:  --Quantiferon Gold POSITIVE --f/u AFB sputum culture with smear, NTD --continue airborne precautions until culture finalized    LOS: 4 days   Lewie Chamber 04/13/2013, 9:21 AM

## 2013-04-13 NOTE — Progress Notes (Addendum)
PULMONARY  / CRITICAL CARE MEDICINE  Name: Monique [name] MRN: [id number] DOB: 01/13/1971    ADMISSION DATE:  04/09/2013 CONSULTATION DATE:  04/09/13   REFERRING MD :  ED MD PRIMARY SERVICE:  TRH  CHIEF COMPLAINT:  Chest pain x 1 day  BRIEF PATIENT DESCRIPTION:  41 y/o F with hx of HTN and former smoker presents to the [facility] ED with complaints of chest pain x 1 day. PCCM was called for consult for abnormal CT.  SIGNIFICANT EVENTS / STUDIES:  8/14>>>ED 8/14 CT Chest>>> Consolidative findings in the left upper lobe are most consistent with pneumonia and associated central abscess.   LINES / TUBES: PIV  CULTURES: 8/14 UC>>> 8/14 HIV positive.  ANTIBIOTICS: 8/14 vanc>>> 8/14 rocephin>>> 8/14 zithromax>>>   SUBJECTIVE/OVERNIGHT/INTERVAL HX  8/18: AFB SMEAR  Neg x 2 but Quantiferon gold positive. HIV reactive. ID requesting bronch. She has had c-section anesthesia in past without problems.    VITAL SIGNS: Temp:  [97.6 F (36.4 C)-102.9 F (39.4 C)] 97.6 F (36.4 C) (08/18 0651) Pulse Rate:  [92-121] 92 (08/18 0651) Resp:  [18-26] 18 (08/18 0651) BP: (113-166)/(70-102) 113/70 mmHg (08/18 0651) SpO2:  [97 %-99 %] 97 % (08/18 0651)  PHYSICAL EXAMINATION: General:  WDWN female, in NAD Neuro:  No focal neuro deficits Oral: Very poor dentition on posterior aspects of lower jaw teeth bilaterally. HEENT:  PERRL Cardiovascular:  RRR, no m/r/g Lungs: rhonchi heard in LUL area, R lung CTA, resp's even bil Abdomen:  BS+, no masses, nontender, ND Musculoskeletal:  No gross deformities noted Skin:  Intact   PULMONARY No results found for this basename: PHART, PCO2, PCO2ART, PO2, PO2ART, HCO3, TCO2, O2SAT,  in the last 168 hours  CBC  Recent Labs Lab 04/10/13 0535 04/11/13 0500 04/13/13 0525  HGB 9.5* 9.6* 9.2*  HCT 26.9* 26.6* 25.7*  WBC 12.4* 12.4* 19.7*  PLT 191 221 279    COAGULATION No results found for this basename: INR,  in the last 168 hours  CARDIAC  No results found for this basename: TROPONINI,  in the last 168 hours No results found for this basename: PROBNP,  in the last 168 hours   CHEMISTRY  Recent Labs Lab 04/09/13 0901 04/09/13 1530 04/10/13 0535 04/11/13 0500 04/13/13 0525  NA 125* 128* 132* 134* 132*  K 2.6* 3.6 4.0 4.2 3.4*  CL 95* 98 105 107 103  CO2 18* 18* 16* 17* 18*  GLUCOSE 129* 93 100* 121* 123*  BUN 17 15 18 15 12  CREATININE 1.65* 1.48* 1.45* 1.18* 1.21*  CALCIUM 8.6 8.0* 7.7* 8.2* 8.6  MG 1.6  --   --   --   --    Estimated Creatinine Clearance: 64.2 ml/min (by C-G formula based on Cr of 1.21).   LIVER  Recent Labs Lab 04/09/13 0901 04/11/13 0500  AST 38* 72*  ALT 22 35  ALKPHOS 110 97  BILITOT 0.6 0.3  PROT 7.6 6.8  ALBUMIN 2.2* 1.7*     INFECTIOUS  Recent Labs Lab 04/09/13 0908  LATICACIDVEN 1.02     ENDOCRINE CBG (last 3)  No results found for this basename: GLUCAP,  in the last 72 hours       IMAGING x48h  No results found.      ASSESSMENT / PLAN:  A: LUL pulmonary abscess likely due to poor dentition vs. ? TB exposure Poor dentition with multiple caries.  HIV positive. CD4 70  P:  - Needs Bronch + BAL at 4pm   04/13/13 - SEND PCP, AFB, FUNGUS, BACTERIA, VIRUS PCR - Will avoid biopsy due to bleeding risk - Risks of pneumothorax, hemothorax, sedation/anesthesia complications such as cardiac or respiratory arrest or hypotension, stroke and bleeding all explained. Benefits of diagnosis but limitations of non-diagnosis also explained. Patient verbalized understanding and wished to proceed.    D/w Dr Rai   Dr. Bobbijo Holst, M.D., F.C.C.P Pulmonary and Critical Care Medicine Staff Physician [facility] Pager: [phone], If no answer or between  15:00h - 7:00h: call [phone]  04/13/2013 10:22 AM     

## 2013-04-13 NOTE — Progress Notes (Signed)
Video Bronchoscopy with washing intervention

## 2013-04-13 NOTE — ED Provider Notes (Signed)
I saw and evaluated the patient, reviewed the resident's note and I agree with the findings and plan and agree with their ECG interpretation. Patient with chest pain and fevers. X-ray showed possible mass versus pneumonia. CT done and showed pneumonia with central abscess. Admitted to internal medicine.  Juliet Rude. Rubin Payor, MD 04/13/13 [phone]

## 2013-04-13 NOTE — H&P (View-Only) (Signed)
PULMONARY  / CRITICAL CARE MEDICINE  Name: Monique [name] MRN: [id number] DOB: 1971/08/10    ADMISSION DATE:  04/09/2013 CONSULTATION DATE:  04/09/13   REFERRING MD :  ED MD PRIMARY SERVICE:  TRH  CHIEF COMPLAINT:  Chest pain x 1 day  BRIEF PATIENT DESCRIPTION:  42 y/o F with hx of HTN and former smoker presents to the [facility] ED with complaints of chest pain x 1 day. PCCM was called for consult for abnormal CT.  SIGNIFICANT EVENTS / STUDIES:  8/14>>>ED 8/14 CT Chest>>> Consolidative findings in the left upper lobe are most consistent with pneumonia and associated central abscess.   LINES / TUBES: PIV  CULTURES: 8/14 UC>>> 8/14 HIV positive.  ANTIBIOTICS: 8/14 vanc>>> 8/14 rocephin>>> 8/14 zithromax>>>   SUBJECTIVE/OVERNIGHT/INTERVAL HX  8/18: AFB SMEAR  Neg x 2 but Quantiferon gold positive. HIV reactive. ID requesting bronch. She has had c-section anesthesia in past without problems.    VITAL SIGNS: Temp:  [97.6 F (36.4 C)-102.9 F (39.4 C)] 97.6 F (36.4 C) (08/18 0651) Pulse Rate:  [92-121] 92 (08/18 0651) Resp:  [18-26] 18 (08/18 0651) BP: (113-166)/(70-102) 113/70 mmHg (08/18 0651) SpO2:  [97 %-99 %] 97 % (08/18 0651)  PHYSICAL EXAMINATION: General:  WDWN female, in NAD Neuro:  No focal neuro deficits Oral: Very poor dentition on posterior aspects of lower jaw teeth bilaterally. HEENT:  PERRL Cardiovascular:  RRR, no m/r/g Lungs: rhonchi heard in LUL area, R lung CTA, resp's even bil Abdomen:  BS+, no masses, nontender, ND Musculoskeletal:  No gross deformities noted Skin:  Intact   PULMONARY No results found for this basename: PHART, PCO2, PCO2ART, PO2, PO2ART, HCO3, TCO2, O2SAT,  in the last 168 hours  CBC  Recent Labs Lab 04/10/13 0535 04/11/13 0500 04/13/13 0525  HGB 9.5* 9.6* 9.2*  HCT 26.9* 26.6* 25.7*  WBC 12.4* 12.4* 19.7*  PLT 191 221 279    COAGULATION No results found for this basename: INR,  in the last 168 hours  CARDIAC  No results found for this basename: TROPONINI,  in the last 168 hours No results found for this basename: PROBNP,  in the last 168 hours   CHEMISTRY  Recent Labs Lab 04/09/13 0901 04/09/13 1530 04/10/13 0535 04/11/13 0500 04/13/13 0525  NA 125* 128* 132* 134* 132*  K 2.6* 3.6 4.0 4.2 3.4*  CL 95* 98 105 107 103  CO2 18* 18* 16* 17* 18*  GLUCOSE 129* 93 100* 121* 123*  BUN 17 15 18 15 12   CREATININE 1.65* 1.48* 1.45* 1.18* 1.21*  CALCIUM 8.6 8.0* 7.7* 8.2* 8.6  MG 1.6  --   --   --   --    Estimated Creatinine Clearance: 64.2 ml/min (by C-G formula based on Cr of 1.21).   LIVER  Recent Labs Lab 04/09/13 0901 04/11/13 0500  AST 38* 72*  ALT 22 35  ALKPHOS 110 97  BILITOT 0.6 0.3  PROT 7.6 6.8  ALBUMIN 2.2* 1.7*     INFECTIOUS  Recent Labs Lab 04/09/13 0908  LATICACIDVEN 1.02     ENDOCRINE CBG (last 3)  No results found for this basename: GLUCAP,  in the last 72 hours       IMAGING x48h  No results found.      ASSESSMENT / PLAN:  A: LUL pulmonary abscess likely due to poor dentition vs. ? TB exposure Poor dentition with multiple caries.  HIV positive. CD4 70  P:  - Needs Bronch + BAL at 4pm  04/13/13 - SEND PCP, AFB, FUNGUS, BACTERIA, VIRUS PCR - Will avoid biopsy due to bleeding risk - Risks of pneumothorax, hemothorax, sedation/anesthesia complications such as cardiac or respiratory arrest or hypotension, stroke and bleeding all explained. Benefits of diagnosis but limitations of non-diagnosis also explained. Patient verbalized understanding and wished to proceed.    D/w Dr Isidoro Donning   Dr. Kalman Shan, M.D., [facility].C.P Pulmonary and Critical Care Medicine Staff Physician [facility] Pager: [phone], If no answer or between  15:00h - 7:00h: call [phone]  04/13/2013 10:22 AM

## 2013-04-13 NOTE — Progress Notes (Addendum)
INFECTIOUS DISEASE ATTENDING ADDENDUM:     [facility]   Date: 04/13/2013  Patient name: Monique [name]  Medical record number: [id number]  Date of birth: 18-Apr-1971    This patient has been seen and discussed with the house staff. Please see their note for complete details. I concur with their findings with the following additions/corrections:  Despite the fact that the patient has TWO NEGATIVE SMEARS FOR AFB, and that she is better on anti-bacterial therapy, I remain concerned for possibility of pulmonary TB  As mentioned the fact that she has HIV (though not technically confirmed by WB or HIV RNA yet) puts her pretest probablility significantly HIGHER  Additionally her QUANTIFERON gold is positive  If she indeed has pulmonary TB then her ARV initiation will need to be delayed by 2 weeks   I have asked Micah Flesher if he or partner will consider bronchoscopy for deep cultures for AFB, fungal, PCP, and bacterial organisms  PLEASE DO NOT REMOVE FROM AIRBORNE EVEN IF THERE IS A NEGATIVE SPUTUM EXPECTORATED FOR TB, WHILE  WE ARE CONSIDERING A DEEPER SPECIMEN FROM BRONCHOSCOPY  I will narrow to unsayn in terms of her antibacterial therapy  SHe will need daily bactrim for PCP prophylaxis  I will talk to [facility] about getting her enrolled into ADAP--we will need positive WB and HIV RNA to get her enrolled. ARIA would be another possibility (a pharma sponsored trial but if she has TB I am afraid that may be an exlucison criteria   [name] 04/13/2013, 9:38 AM

## 2013-04-14 ENCOUNTER — Encounter (HOSPITAL_COMMUNITY): Payer: Self-pay | Admitting: Internal Medicine

## 2013-04-14 LAB — RESPIRATORY VIRUS PANEL
Adenovirus: NOT DETECTED
Influenza A H1: NOT DETECTED
Influenza A H3: NOT DETECTED
Influenza A: NOT DETECTED
Influenza B: NOT DETECTED
Metapneumovirus: NOT DETECTED
Parainfluenza 1: NOT DETECTED
Parainfluenza 3: NOT DETECTED
Respiratory Syncytial Virus B: NOT DETECTED
Rhinovirus: NOT DETECTED

## 2013-04-14 MED ORDER — FUROSEMIDE 40 MG PO TABS
40.0000 mg | ORAL_TABLET | Freq: Every day | ORAL | Status: DC
  Administered 2013-04-14 – 2013-04-16 (×3): 40 mg via ORAL
  Filled 2013-04-14 (×3): qty 1

## 2013-04-14 NOTE — Progress Notes (Signed)
[facility]    Subjective: Continues to spike fevers daily, last night she had Tmax 102.9 at 2202 and says she was given tylenol with subsequent defervescence. Remains afebrile as of this am. She says had originally felt progressively better over the last couple days until last night when she started having more difficulty breathing, began coughing consistently and steadily, although nonproductive, and now states she has chills and sweats. She was visibly having chills upon exam.    Antibiotics:  Anti-infectives   Start     Dose/Rate Route Frequency Ordered Stop   04/13/13 1845  sulfamethoxazole-trimethoprim (BACTRIM DS) 800-160 MG per tablet 1 tablet     1 tablet Oral Daily 04/13/13 1813     04/11/13 2000  Ampicillin-Sulbactam (UNASYN) 3 g in sodium chloride 0.9 % 100 mL IVPB     3 g 100 mL/hr over 60 Minutes Intravenous Every 6 hours 04/11/13 1439     04/11/13 1500  vancomycin (VANCOCIN) IVPB 1000 mg/200 mL premix  Status:  Discontinued     1,000 mg 200 mL/hr over 60 Minutes Intravenous Every 12 hours 04/11/13 1441 04/13/13 0935   04/10/13 1400  vancomycin (VANCOCIN) IVPB 1000 mg/200 mL premix  Status:  Discontinued     1,000 mg 200 mL/hr over 60 Minutes Intravenous Every 24 hours 04/09/13 1323 04/11/13 1441   04/09/13 2000  Ampicillin-Sulbactam (UNASYN) 3 g in sodium chloride 0.9 % 100 mL IVPB  Status:  Discontinued     3 g 100 mL/hr over 60 Minutes Intravenous Every 8 hours 04/09/13 1328 04/11/13 1439   04/09/13 1330  vancomycin (VANCOCIN) 1,250 mg in sodium chloride 0.9 % 250 mL IVPB     1,250 mg 166.7 mL/hr over 90 Minutes Intravenous STAT 04/09/13 1322 04/09/13 1538   04/09/13 1315  clindamycin (CLEOCIN) IVPB 600 mg     600 mg 100 mL/hr over 30 Minutes Intravenous  Once 04/09/13 1302 04/09/13 1359   04/09/13 0900  cefTRIAXone (ROCEPHIN) 1 g in dextrose 5 % 50 mL IVPB     1 g 100 mL/hr over 30 Minutes Intravenous  Once 04/09/13 0850 04/09/13 1042     04/09/13 0900  azithromycin (ZITHROMAX) 500 mg in dextrose 5 % 250 mL IVPB     500 mg 250 mL/hr over 60 Minutes Intravenous  Once 04/09/13 0850 04/09/13 1227      Medications: Scheduled Meds: . ampicillin-sulbactam (UNASYN) IV  3 g Intravenous Q6H  . benzonatate  100 mg Oral TID  . dextromethorphan  30 mg Oral BID  . enoxaparin (LOVENOX) injection  40 mg Subcutaneous Q24H  . irbesartan  300 mg Oral Daily   And  . hydrochlorothiazide  12.5 mg Oral Daily  . sodium chloride  3 mL Intravenous Q12H  . sulfamethoxazole-trimethoprim  1 tablet Oral Daily   Continuous Infusions:  PRN Meds:.acetaminophen, acetaminophen, chlorpheniramine-HYDROcodone, ibuprofen, oxyCODONE   Objective: Weight change:   Intake/Output Summary (Last 24 hours) at 04/14/13 0927 Last data filed at 04/14/13 7829  Gross per 24 hour  Intake    240 ml  Output      2 ml  Net    238 ml   Blood pressure 112/94, pulse 98, temperature 97.9 F (36.6 C), temperature source Oral, resp. rate 20, height 5\' 2"  (1.575 m), weight 92.534 kg (204 lb), last menstrual period 03/09/2013, SpO2 97.00%. Temp:  [97.9 F (36.6 C)-102.9 F (39.4 C)] 97.9 F (36.6 C) (08/19 0635) Pulse Rate:  [98] 98 (08/19 0635)  Resp:  [17-35] 20 (08/19 0635) BP: (110-191)/(66-142) 112/94 mmHg (08/19 0635) SpO2:  [88 %-100 %] 97 % (08/19 0635) Weight:  [92.534 kg (204 lb)] 92.534 kg (204 lb) (08/18 1706)  Physical Exam: General: Alert and awake, oriented x3, uncomfortable and shaking with chills in bed HEENT: anicteric sclera, pupils reactive to light and accommodation, EOMI CVS regular rate, normal r,  no murmur rubs or gallops Chest: decreased breath sounds LUL, coarse breath sounds throughout, expiratory wheezes B/L Abdomen: soft nontender, nondistended, normal bowel sounds Extremities: 1+ pitting edema B/L LE Skin: no rashes Lymph: no new lymphadenopathy Neuro: nonfocal  Lab Results:  Recent Labs  04/13/13 0525  WBC 19.7*  HGB  9.2*  HCT 25.7*  PLT 279    BMET  Recent Labs  04/13/13 0525  NA 132*  K 3.4*  CL 103  CO2 18*  GLUCOSE 123*  BUN 12  CREATININE 1.21*  CALCIUM 8.6    Micro Results: Recent Results (from the past 240 hour(s))  CULTURE, BLOOD (ROUTINE X 2)     Status: None   Collection Time    04/09/13  8:55 AM      Result Value Range Status   Specimen Description BLOOD RIGHT ANTECUBITAL   Final   Special Requests BOTTLES DRAWN AEROBIC ONLY   Final   Culture  Setup Time     Final   Value: 04/09/2013 17:08     Performed at [facility]   Culture     Final   Value:        BLOOD CULTURE RECEIVED NO GROWTH TO DATE CULTURE WILL BE HELD FOR 5 DAYS BEFORE ISSUING A FINAL NEGATIVE REPORT     Performed at [facility]   Report Status PENDING   Incomplete  CULTURE, BLOOD (ROUTINE X 2)     Status: None   Collection Time    04/09/13  9:02 AM      Result Value Range Status   Specimen Description BLOOD ARM RIGHT   Final   Special Requests BOTTLES DRAWN AEROBIC ONLY   Final   Culture  Setup Time     Final   Value: 04/09/2013 17:07     Performed at [facility]   Culture     Final   Value:        BLOOD CULTURE RECEIVED NO GROWTH TO DATE CULTURE WILL BE HELD FOR 5 DAYS BEFORE ISSUING A FINAL NEGATIVE REPORT     Performed at [facility]   Report Status PENDING   Incomplete  MRSA PCR SCREENING     Status: None   Collection Time    04/09/13  3:05 PM      Result Value Range Status   MRSA by PCR NEGATIVE  NEGATIVE Final   Comment:            The GeneXpert MRSA Assay (FDA     approved for NASAL specimens     only), is one component of a     comprehensive MRSA colonization     surveillance program. It is not     intended to diagnose MRSA     infection nor to guide or     monitor treatment for     MRSA infections.  CULTURE, EXPECTORATED SPUTUM-ASSESSMENT     Status: None   Collection Time    04/09/13  5:18 PM      Result Value Range Status   Specimen  Description SPUTUM  Final   Special Requests Normal   Final   Sputum evaluation     Final   Value: MICROSCOPIC FINDINGS SUGGEST THAT THIS SPECIMEN IS NOT REPRESENTATIVE OF LOWER RESPIRATORY SECRETIONS. PLEASE RECOLLECT.     CALLED TO RN S.MERLINI AT 0053 04/10/13 BY L.PITT   Report Status 04/10/2013 FINAL   Final  CULTURE, EXPECTORATED SPUTUM-ASSESSMENT     Status: None   Collection Time    04/10/13  3:04 AM      Result Value Range Status   Specimen Description SPUTUM   Final   Special Requests NONE   Final   Sputum evaluation     Final   Value: MICROSCOPIC FINDINGS SUGGEST THAT THIS SPECIMEN IS NOT REPRESENTATIVE OF LOWER RESPIRATORY SECRETIONS. PLEASE RECOLLECT.     CALLED TO MERLINI,S RN 04/10/2013 0336 JORDANS   Report Status 04/10/2013 FINAL   Final  AFB CULTURE WITH SMEAR     Status: None   Collection Time    04/10/13 11:00 AM      Result Value Range Status   Specimen Description SPUTUM   Final   Special Requests NONE   Final   ACID FAST SMEAR     Final   Value: NO ACID FAST BACILLI SEEN     Performed at [facility]   Culture     Final   Value: CULTURE WILL BE EXAMINED FOR 6 WEEKS BEFORE ISSUING A FINAL REPORT     Performed at [facility]   Report Status PENDING   Incomplete  AFB CULTURE WITH SMEAR     Status: None   Collection Time    04/11/13 11:42 AM      Result Value Range Status   Specimen Description SPUTUM   Final   Special Requests Immunocompromised   Final   ACID FAST SMEAR     Final   Value: NO ACID FAST BACILLI SEEN     Performed at [facility]   Culture     Final   Value: CULTURE WILL BE EXAMINED FOR 6 WEEKS BEFORE ISSUING A FINAL REPORT     Performed at [facility]   Report Status PENDING   Incomplete  AFB CULTURE WITH SMEAR     Status: None   Collection Time    04/12/13  3:04 PM      Result Value Range Status   Specimen Description NASOPHARYNGEAL   Final   Special Requests Immunocompromised   Final   ACID FAST  SMEAR     Final   Value: NO ACID FAST BACILLI SEEN     Performed at [facility]   Culture     Final   Value: CULTURE WILL BE EXAMINED FOR 6 WEEKS BEFORE ISSUING A FINAL REPORT     Performed at [facility]   Report Status PENDING   Incomplete  CULTURE, BAL-QUANTITATIVE     Status: None   Collection Time    04/13/13  5:40 PM      Result Value Range Status   Specimen Description BRONCHIAL ALVEOLAR LAVAGE   Final   Special Requests NONE   Final   Gram Stain     Final   Value: RARE WBC PRESENT, PREDOMINANTLY PMN     NO SQUAMOUS EPITHELIAL CELLS SEEN     NO ORGANISMS SEEN     Performed at [facility] Count PENDING   Incomplete   Culture PENDING   Incomplete   Report Status  PENDING   Incomplete    Studies/Results: No results found.    Assessment/Plan: ONEY TATLOCK is a 41 y.o. female with HTN who presented with fevers/chills, left sided substernal chest pain TTP. Chest CT consistent with pneumonia and LUL abscess with need for f/u to confirm clearance and exclude any malignancy. HIV positive and she has been placed on airborne precautions until TB workup concludes.   Pneumonia, with abscess:  --d/c vanc  --continue unasyn per pharmacy for narrowed coverage, appreciate pharmacist assistance in the management of this patient  --f/u blood cx, NGTD  --Bronch performed yesterday afternoon, will f/u results (see TB) --f/u respiratory virus panel --will continue to follow abscess response to abx and consider needle aspiration if needed   HIV: Confirmatory testing with Western Blot, POSITIVE. Wishes for NO INFORMATION TO BE DISCUSSED WITH FAMILY PRESENT  --CD4 = 70  --VL = 73,711 --Crypto Ag, neg  --HepC Ab, neg  -- Acute Hep panel, Negative --HLA [id number], pending  --HIV genotype pending --f/u PCP smear, fungus cx, legionella --If she is found to have active pulmonary TB will need to wait 2 weeks prior to initiation of HAART to decrease risk of  IRIS --continue Bactrim for PCP ppx  Rule out TB:  --Quantiferon Gold POSITIVE  --AFB sputum x 3, negative --f/u bronch results, performed yesterday  --continue airborne precautions despite AFB neg x 3, and await results from bronch which obtained much deeper secretions/sputum    LOS: 5 days   Lewie Chamber 04/14/2013, 9:27 AM

## 2013-04-14 NOTE — Progress Notes (Signed)
Patient ID: Monique [name]  female  ZOX:[id number]    DOB: [birth year]    DOA: 04/09/2013  PCP: Default, Provider, MD  Assessment/Plan:   Sepsis : Likely due to lung abscess and pneumonia -3 AFB's are negative, blood cultures negative. - Bronch yesterday, follow bronch results -hemodynamically stable. Continue Unasyn  -Per ID, continue airborne precautions even if there is negative sputum   Acute respiratory failure with hypoxia/Pneumonia with lung abscess: Still on 4 L O2 via nasal cannula  -3 AFB's are negative,Quantiferon Gold positive, concern given new dx HIV this may be opportunistic infection  -ID and pulmonology following.  - d/w Dr. Algis Liming, possibility of pulmonary TB with HIV, the probability is high. Appreciate Dr Zenaida Niece Dam's efforts   Acute kidney injury/ Dehydration with hyponatremia  -check BMET  Human immunodeficiency virus (HIV) disease CD4 = 70, VL = 73,711  Crypto Ag, neg  HIV genotype pending - PT DOES NOT WANT THIS DIAGNOSIS DISCUSSED WITH ANY FRIENDS OR FAMILY.  Peripheral edema: Likely secondary to hypoalbuminemia, albumin 1.7 on 8/16  DVT Prophylaxis: Lovenox  Code Status: Full code  Disposition:    Subjective: Sitting at the edge of the bed, did have fever last night, coughing  Objective: Weight change:   Intake/Output Summary (Last 24 hours) at 04/14/13 1328 Last data filed at 04/14/13 0833  Gross per 24 hour  Intake    240 ml  Output      2 ml  Net    238 ml   Blood pressure 112/94, pulse 98, temperature 97.9 F (36.6 C), temperature source Oral, resp. rate 20, height 5\' 2"  (1.575 m), weight 92.534 kg (204 lb), last menstrual period 03/09/2013, SpO2 97.00%.  Physical Exam: General: Alert and awake, oriented x3, not in any acute distress. CVS: S1-S2 clear, no murmur rubs or gallops Chest: Coarse breath sounds bilaterally Abdomen: soft nontender, nondistended, normal bowel sounds  Extremities: no cyanosis, clubbing. 1-2+ edema noted  bilaterally   Lab Results: Basic Metabolic Panel:  Recent Labs Lab 04/09/13 0901  04/11/13 0500 04/13/13 0525  NA 125*  < > 134* 132*  K 2.6*  < > 4.2 3.4*  CL 95*  < > 107 103  CO2 18*  < > 17* 18*  GLUCOSE 129*  < > 121* 123*  BUN 17  < > 15 12  CREATININE 1.65*  < > 1.18* 1.21*  CALCIUM 8.6  < > 8.2* 8.6  MG 1.6  --   --   --   < > = values in this interval not displayed. Liver Function Tests:  Recent Labs Lab 04/09/13 0901 04/11/13 0500  AST 38* 72*  ALT 22 35  ALKPHOS 110 97  BILITOT 0.6 0.3  PROT 7.6 6.8  ALBUMIN 2.2* 1.7*   No results found for this basename: LIPASE, AMYLASE,  in the last 168 hours No results found for this basename: AMMONIA,  in the last 168 hours CBC:  Recent Labs Lab 04/09/13 0901  04/11/13 0500 04/13/13 0525  WBC 13.7*  < > 12.4* 19.7*  NEUTROABS 11.4*  --   --   --   HGB 10.4*  < > 9.6* 9.2*  HCT 28.3*  < > 26.6* 25.7*  MCV 81.6  < > 81.1 80.3  PLT 190  < > 221 279  < > = values in this interval not displayed. Cardiac Enzymes: No results found for this basename: CKTOTAL, CKMB, CKMBINDEX, TROPONINI,  in the last 168 hours BNP: No components found with  this basename: POCBNP,  CBG: No results found for this basename: GLUCAP,  in the last 168 hours   Micro Results: Recent Results (from the past 240 hour(s))  CULTURE, BLOOD (ROUTINE X 2)     Status: None   Collection Time    04/09/13  8:55 AM      Result Value Range Status   Specimen Description BLOOD RIGHT ANTECUBITAL   Final   Special Requests BOTTLES DRAWN AEROBIC ONLY   Final   Culture  Setup Time     Final   Value: 04/09/2013 17:08     Performed at [facility]   Culture     Final   Value:        BLOOD CULTURE RECEIVED NO GROWTH TO DATE CULTURE WILL BE HELD FOR 5 DAYS BEFORE ISSUING A FINAL NEGATIVE REPORT     Performed at [facility]   Report Status PENDING   Incomplete  CULTURE, BLOOD (ROUTINE X 2)     Status: None   Collection Time     04/09/13  9:02 AM      Result Value Range Status   Specimen Description BLOOD ARM RIGHT   Final   Special Requests BOTTLES DRAWN AEROBIC ONLY   Final   Culture  Setup Time     Final   Value: 04/09/2013 17:07     Performed at [facility]   Culture     Final   Value:        BLOOD CULTURE RECEIVED NO GROWTH TO DATE CULTURE WILL BE HELD FOR 5 DAYS BEFORE ISSUING A FINAL NEGATIVE REPORT     Performed at [facility]   Report Status PENDING   Incomplete  MRSA PCR SCREENING     Status: None   Collection Time    04/09/13  3:05 PM      Result Value Range Status   MRSA by PCR NEGATIVE  NEGATIVE Final   Comment:            The GeneXpert MRSA Assay (FDA     approved for NASAL specimens     only), is one component of a     comprehensive MRSA colonization     surveillance program. It is not     intended to diagnose MRSA     infection nor to guide or     monitor treatment for     MRSA infections.  CULTURE, EXPECTORATED SPUTUM-ASSESSMENT     Status: None   Collection Time    04/09/13  5:18 PM      Result Value Range Status   Specimen Description SPUTUM   Final   Special Requests Normal   Final   Sputum evaluation     Final   Value: MICROSCOPIC FINDINGS SUGGEST THAT THIS SPECIMEN IS NOT REPRESENTATIVE OF LOWER RESPIRATORY SECRETIONS. PLEASE RECOLLECT.     CALLED TO RN S.MERLINI AT 0053 04/10/13 BY L.PITT   Report Status 04/10/2013 FINAL   Final  CULTURE, EXPECTORATED SPUTUM-ASSESSMENT     Status: None   Collection Time    04/10/13  3:04 AM      Result Value Range Status   Specimen Description SPUTUM   Final   Special Requests NONE   Final   Sputum evaluation     Final   Value: MICROSCOPIC FINDINGS SUGGEST THAT THIS SPECIMEN IS NOT REPRESENTATIVE OF LOWER RESPIRATORY SECRETIONS. PLEASE RECOLLECT.     CALLED TO MERLINI,S RN 04/10/2013 0336 JORDANS  Report Status 04/10/2013 FINAL   Final  AFB CULTURE WITH SMEAR     Status: None   Collection Time    04/10/13 11:00 AM       Result Value Range Status   Specimen Description SPUTUM   Final   Special Requests NONE   Final   ACID FAST SMEAR     Final   Value: NO ACID FAST BACILLI SEEN     Performed at [facility]   Culture     Final   Value: CULTURE WILL BE EXAMINED FOR 6 WEEKS BEFORE ISSUING A FINAL REPORT     Performed at [facility]   Report Status PENDING   Incomplete  AFB CULTURE WITH SMEAR     Status: None   Collection Time    04/11/13 11:42 AM      Result Value Range Status   Specimen Description SPUTUM   Final   Special Requests Immunocompromised   Final   ACID FAST SMEAR     Final   Value: NO ACID FAST BACILLI SEEN     Performed at [facility]   Culture     Final   Value: CULTURE WILL BE EXAMINED FOR 6 WEEKS BEFORE ISSUING A FINAL REPORT     Performed at [facility]   Report Status PENDING   Incomplete  AFB CULTURE WITH SMEAR     Status: None   Collection Time    04/12/13  3:04 PM      Result Value Range Status   Specimen Description NASOPHARYNGEAL   Final   Special Requests Immunocompromised   Final   ACID FAST SMEAR     Final   Value: NO ACID FAST BACILLI SEEN     Performed at [facility]   Culture     Final   Value: CULTURE WILL BE EXAMINED FOR 6 WEEKS BEFORE ISSUING A FINAL REPORT     Performed at [facility]   Report Status PENDING   Incomplete  FUNGUS CULTURE W SMEAR     Status: None   Collection Time    04/13/13  5:40 PM      Result Value Range Status   Specimen Description BRONCHIAL ALVEOLAR LAVAGE   Final   Special Requests Immunocompromised   Final   Fungal Smear     Final   Value: RARE YEAST     Performed at [facility]   Culture     Final   Value: CULTURE IN PROGRESS FOR FOUR WEEKS     Performed at [facility]   Report Status PENDING   Incomplete  CULTURE, BAL-QUANTITATIVE     Status: None   Collection Time    04/13/13  5:40 PM      Result Value Range Status   Specimen Description BRONCHIAL  ALVEOLAR LAVAGE   Final   Special Requests NONE   Final   Gram Stain     Final   Value: RARE WBC PRESENT, PREDOMINANTLY PMN     NO SQUAMOUS EPITHELIAL CELLS SEEN     NO ORGANISMS SEEN     Performed at [facility] Count PENDING   Incomplete   Culture PENDING   Incomplete   Report Status PENDING   Incomplete    Studies/Results: Ct Angio Chest Pe W/cm &/or Wo Cm  04/09/2013   *RADIOLOGY REPORT*  Clinical Data: Left upper chest pain and shortness of breath. Nonproductive cough and fever.  CT ANGIOGRAPHY CHEST  Technique:  Multidetector CT imaging of the chest using the standard protocol during bolus administration of intravenous contrast. Multiplanar reconstructed images including MIPs were obtained and reviewed to evaluate the vascular anatomy.  Contrast: OMNIPAQUE IOHEXOL 350 MG/ML SOLN  Comparison: Chest radiograph 04/09/2013.  Findings: No pulmonary embolus.  Mediastinal lymph nodes measure up to 9 mm in the low right paratracheal station.  Axillary lymph nodes measure up to 13 mm on the left.  Heart is at the upper limits of normal in size to mildly enlarged.  No pericardial effusion.  Trace left pleural fluid.  Large area of airspace consolidation and surrounding inflammatory change in the left upper lobe with internal low attenuation and air.  Airway remains grossly patent. Minimal scattered peribronchovascular nodularity in the right lung.  Incidental imaging of the upper abdomen shows no acute findings. No worrisome lytic or sclerotic lesions.  IMPRESSION:  1.  Negative for pulmonary embolus. 2.  Consolidative findings in the left upper lobe are most consistent with pneumonia and associated central abscess. Follow-up to clearing is recommended as malignancy cannot be definitively excluded.  These results were called by telephone on 04/09/2013 at 1200 hours to Dr. Rubin Payor, who verbally acknowledged these results. 3.  Mediastinal and axillary lymph nodes may be reactive. 4.   Trace left pleural fluid.   Original Report Authenticated By: Leanna Battles, M.D.   Dg Chest Portable 1 View  04/09/2013   *RADIOLOGY REPORT*  Clinical Data: Chest pain  PORTABLE CHEST - 1 VIEW  Comparison: July 03, 2007  Findings:  There is a large area of consolidation with questionable mass in the left upper lobe.  This area of opacity measures 10.1 x 6.3 cm.  Right lung is clear.  Heart is mildly enlarged with normal pulmonary vascularity.  No adenopathy.  IMPRESSION: Consolidation with questionable mass left upper lobe.  Advise correlation with contrast enhanced chest CT to further evaluate. Right lung clear.  Mild cardiomegaly.   Original Report Authenticated By: Bretta Bang, M.D.    Medications: Scheduled Meds: . ampicillin-sulbactam (UNASYN) IV  3 g Intravenous Q6H  . benzonatate  100 mg Oral TID  . dextromethorphan  30 mg Oral BID  . enoxaparin (LOVENOX) injection  40 mg Subcutaneous Q24H  . irbesartan  300 mg Oral Daily   And  . hydrochlorothiazide  12.5 mg Oral Daily  . sodium chloride  3 mL Intravenous Q12H  . sulfamethoxazole-trimethoprim  1 tablet Oral Daily      LOS: 5 days   Nannette Zill M.D. [facility] 04/14/2013, 1:28 PM Pager: [phone]  If 7PM-7AM, please contact night-coverage [email or web address] Password [email or web address]

## 2013-04-14 NOTE — Progress Notes (Signed)
ANTIBIOTIC CONSULT NOTE - FOLLOW UP  Pharmacy Consult for Unasyn Indication: pneumonia with lung abscess  No Known Allergies  Patient Measurements: Height: 5\' 2"  (157.5 cm) Weight: 204 lb (92.534 kg) IBW/kg (Calculated) : 50.1  Vital Signs: Temp: 97.9 F (36.6 C) (08/19 0635) Temp src: Oral (08/19 0635) BP: 112/94 mmHg (08/19 0635) Pulse Rate: 98 (08/19 0635) Intake/Output from previous day: 08/18 0701 - 08/19 0700 In: -  Out: 1 [Stool:1] Intake/Output from this shift: Total I/O In: 240 [P.O.:240] Out: 1 [Stool:1]  Labs:  Recent Labs  04/13/13 0525  WBC 19.7*  HGB 9.2*  PLT 279  CREATININE 1.21*   Estimated Creatinine Clearance: 64.2 ml/min (by C-G formula based on Cr of 1.21). No results found for this basename: VANCOTROUGH, Leodis Binet, VANCORANDOM, GENTTROUGH, GENTPEAK, GENTRANDOM, TOBRATROUGH, TOBRAPEAK, TOBRARND, AMIKACINPEAK, AMIKACINTROU, AMIKACIN,  in the last 72 hours   Microbiology: Recent Results (from the past 720 hour(s))  CULTURE, BLOOD (ROUTINE X 2)     Status: None   Collection Time    04/09/13  8:55 AM      Result Value Range Status   Specimen Description BLOOD RIGHT ANTECUBITAL   Final   Special Requests BOTTLES DRAWN AEROBIC ONLY   Final   Culture  Setup Time     Final   Value: 04/09/2013 17:08     Performed at [facility]   Culture     Final   Value:        BLOOD CULTURE RECEIVED NO GROWTH TO DATE CULTURE WILL BE HELD FOR 5 DAYS BEFORE ISSUING A FINAL NEGATIVE REPORT     Performed at [facility]   Report Status PENDING   Incomplete  CULTURE, BLOOD (ROUTINE X 2)     Status: None   Collection Time    04/09/13  9:02 AM      Result Value Range Status   Specimen Description BLOOD ARM RIGHT   Final   Special Requests BOTTLES DRAWN AEROBIC ONLY   Final   Culture  Setup Time     Final   Value: 04/09/2013 17:07     Performed at [facility]   Culture     Final   Value:        BLOOD CULTURE RECEIVED NO GROWTH  TO DATE CULTURE WILL BE HELD FOR 5 DAYS BEFORE ISSUING A FINAL NEGATIVE REPORT     Performed at [facility]   Report Status PENDING   Incomplete  MRSA PCR SCREENING     Status: None   Collection Time    04/09/13  3:05 PM      Result Value Range Status   MRSA by PCR NEGATIVE  NEGATIVE Final   Comment:            The GeneXpert MRSA Assay (FDA     approved for NASAL specimens     only), is one component of a     comprehensive MRSA colonization     surveillance program. It is not     intended to diagnose MRSA     infection nor to guide or     monitor treatment for     MRSA infections.  CULTURE, EXPECTORATED SPUTUM-ASSESSMENT     Status: None   Collection Time    04/09/13  5:18 PM      Result Value Range Status   Specimen Description SPUTUM   Final   Special Requests Normal   Final   Sputum evaluation  Final   Value: MICROSCOPIC FINDINGS SUGGEST THAT THIS SPECIMEN IS NOT REPRESENTATIVE OF LOWER RESPIRATORY SECRETIONS. PLEASE RECOLLECT.     CALLED TO RN S.MERLINI AT 0053 04/10/13 BY L.PITT   Report Status 04/10/2013 FINAL   Final  CULTURE, EXPECTORATED SPUTUM-ASSESSMENT     Status: None   Collection Time    04/10/13  3:04 AM      Result Value Range Status   Specimen Description SPUTUM   Final   Special Requests NONE   Final   Sputum evaluation     Final   Value: MICROSCOPIC FINDINGS SUGGEST THAT THIS SPECIMEN IS NOT REPRESENTATIVE OF LOWER RESPIRATORY SECRETIONS. PLEASE RECOLLECT.     CALLED TO MERLINI,S RN 04/10/2013 0336 JORDANS   Report Status 04/10/2013 FINAL   Final  AFB CULTURE WITH SMEAR     Status: None   Collection Time    04/10/13 11:00 AM      Result Value Range Status   Specimen Description SPUTUM   Final   Special Requests NONE   Final   ACID FAST SMEAR     Final   Value: NO ACID FAST BACILLI SEEN     Performed at [facility]   Culture     Final   Value: CULTURE WILL BE EXAMINED FOR 6 WEEKS BEFORE ISSUING A FINAL REPORT     Performed at  [facility]   Report Status PENDING   Incomplete  AFB CULTURE WITH SMEAR     Status: None   Collection Time    04/11/13 11:42 AM      Result Value Range Status   Specimen Description SPUTUM   Final   Special Requests Immunocompromised   Final   ACID FAST SMEAR     Final   Value: NO ACID FAST BACILLI SEEN     Performed at [facility]   Culture     Final   Value: CULTURE WILL BE EXAMINED FOR 6 WEEKS BEFORE ISSUING A FINAL REPORT     Performed at [facility]   Report Status PENDING   Incomplete  AFB CULTURE WITH SMEAR     Status: None   Collection Time    04/12/13  3:04 PM      Result Value Range Status   Specimen Description NASOPHARYNGEAL   Final   Special Requests Immunocompromised   Final   ACID FAST SMEAR     Final   Value: NO ACID FAST BACILLI SEEN     Performed at [facility]   Culture     Final   Value: CULTURE WILL BE EXAMINED FOR 6 WEEKS BEFORE ISSUING A FINAL REPORT     Performed at [facility]   Report Status PENDING   Incomplete  FUNGUS CULTURE W SMEAR     Status: None   Collection Time    04/13/13  5:40 PM      Result Value Range Status   Specimen Description BRONCHIAL ALVEOLAR LAVAGE   Final   Special Requests Immunocompromised   Final   Fungal Smear     Final   Value: RARE YEAST     Performed at [facility]   Culture     Final   Value: CULTURE IN PROGRESS FOR FOUR WEEKS     Performed at [facility]   Report Status PENDING   Incomplete  CULTURE, BAL-QUANTITATIVE     Status: None   Collection Time    04/13/13  5:40  PM      Result Value Range Status   Specimen Description BRONCHIAL ALVEOLAR LAVAGE   Final   Special Requests NONE   Final   Gram Stain     Final   Value: RARE WBC PRESENT, PREDOMINANTLY PMN     NO SQUAMOUS EPITHELIAL CELLS SEEN     NO ORGANISMS SEEN     Performed at [facility] Count PENDING   Incomplete   Culture PENDING   Incomplete   Report Status PENDING    Incomplete    Anti-infectives   Start     Dose/Rate Route Frequency Ordered Stop   04/13/13 1845  sulfamethoxazole-trimethoprim (BACTRIM DS) 800-160 MG per tablet 1 tablet     1 tablet Oral Daily 04/13/13 1813     04/11/13 2000  Ampicillin-Sulbactam (UNASYN) 3 g in sodium chloride 0.9 % 100 mL IVPB     3 g 100 mL/hr over 60 Minutes Intravenous Every 6 hours 04/11/13 1439     04/11/13 1500  vancomycin (VANCOCIN) IVPB 1000 mg/200 mL premix  Status:  Discontinued     1,000 mg 200 mL/hr over 60 Minutes Intravenous Every 12 hours 04/11/13 1441 04/13/13 0935   04/10/13 1400  vancomycin (VANCOCIN) IVPB 1000 mg/200 mL premix  Status:  Discontinued     1,000 mg 200 mL/hr over 60 Minutes Intravenous Every 24 hours 04/09/13 1323 04/11/13 1441   04/09/13 2000  Ampicillin-Sulbactam (UNASYN) 3 g in sodium chloride 0.9 % 100 mL IVPB  Status:  Discontinued     3 g 100 mL/hr over 60 Minutes Intravenous Every 8 hours 04/09/13 1328 04/11/13 1439   04/09/13 1330  vancomycin (VANCOCIN) 1,250 mg in sodium chloride 0.9 % 250 mL IVPB     1,250 mg 166.7 mL/hr over 90 Minutes Intravenous STAT 04/09/13 1322 04/09/13 1538   04/09/13 1315  clindamycin (CLEOCIN) IVPB 600 mg     600 mg 100 mL/hr over 30 Minutes Intravenous  Once 04/09/13 1302 04/09/13 1359   04/09/13 0900  cefTRIAXone (ROCEPHIN) 1 g in dextrose 5 % 50 mL IVPB     1 g 100 mL/hr over 30 Minutes Intravenous  Once 04/09/13 0850 04/09/13 1042   04/09/13 0900  azithromycin (ZITHROMAX) 500 mg in dextrose 5 % 250 mL IVPB     500 mg 250 mL/hr over 60 Minutes Intravenous  Once 04/09/13 0850 04/09/13 1227      Assessment: Pt on Unasyn Day #6 for suspected PNA with lung abscess. Tmax 102.9, WBC 19.7 - 8/14 chest CT negative for PE, positive for PNA with central abscess. ID still concerned for TB, bronch 8/18 for deep cultures. HIV reactive (not yet confirmed with CD4 70). Per ID note, If pt with TB will need to wait 2 wks for HAART initiation due to risk of  IRIS. Bactrim for PCP/Toxo prophylaxis (CD4 70).  Vanc 8/14 >>8/18 Unasyn 8/14 >>  8/14 BCx2 - ngtd AFBx3 - ngtd 8/16 Crypto neg 8/15 HCV Ab neg 8/18 BAL >> pending  Goal of Therapy:  Eradication of infection  Plan:  - Continue Unasyn 3gm IV q6h - F/u renal fxn, C&S, clinical status  Christoper Fabian, PharmD, BCPS Clinical pharmacist, pager [phone] 04/14/2013 11:11 AM

## 2013-04-14 NOTE — Progress Notes (Signed)
INFECTIOUS DISEASE ATTENDING ADDENDUM:     [facility]   Date: 04/14/2013  Patient name: Monique [name]  Medical record number: [id number]  Date of birth: 09-08-1970    This patient has been seen and discussed with the house staff. Please see their note for complete details. 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  I will followup with culture results but I at this point in time I AM inclined to "pull the trigger on rx for MTB pulmonary and possible bacterial infection"  One possible regimen we might consider would be  INH, RifaBUTIN, PYRazinamide, Ethambutol and AVelox with latter 5 drugs having activity vs TB and avelox having excellent strep coverage, anaerobe coverage if this is a lung abscess from bacterial cause  With re to her HIV it appears she MUST have been tested outside hospital before because there is phone note from our clinic for HIV intake--that she did not make  I have spoken with Marthann Schiller from [facility] and he or colleague will visit today or am to help get paperwork underway for ADAP application  I spent greater than 45 minutes with the patient including greater than 50% of time in face to face counsel of the patient and in coordination of their care.    [name] 04/14/2013, 12:53 PM

## 2013-04-15 ENCOUNTER — Telehealth: Payer: Self-pay

## 2013-04-15 LAB — CBC
HCT: 23.9 % — ABNORMAL LOW (ref 36.0–46.0)
Hemoglobin: 8.7 g/dL — ABNORMAL LOW (ref 12.0–15.0)
MCH: 29.5 pg (ref 26.0–34.0)
MCHC: 36.4 g/dL — ABNORMAL HIGH (ref 30.0–36.0)
RBC: 2.95 MIL/uL — ABNORMAL LOW (ref 3.87–5.11)

## 2013-04-15 LAB — BASIC METABOLIC PANEL
BUN: 12 mg/dL (ref 6–23)
CO2: 18 mEq/L — ABNORMAL LOW (ref 19–32)
GFR calc non Af Amer: 53 mL/min — ABNORMAL LOW (ref >90)
Glucose, Bld: 90 mg/dL (ref 70–99)
Potassium: 3.8 mEq/L (ref 3.5–5.1)
Sodium: 137 mEq/L (ref 135–145)

## 2013-04-15 LAB — CULTURE, BLOOD (ROUTINE X 2): Culture: NO GROWTH

## 2013-04-15 MED ORDER — ISONIAZID 300 MG PO TABS
300.0000 mg | ORAL_TABLET | Freq: Every day | ORAL | Status: DC
  Administered 2013-04-15 – 2013-04-22 (×8): 300 mg via ORAL
  Filled 2013-04-15 (×8): qty 1

## 2013-04-15 MED ORDER — RIFAMPIN 300 MG PO CAPS
600.0000 mg | ORAL_CAPSULE | Freq: Every day | ORAL | Status: DC
  Administered 2013-04-15: 600 mg via ORAL
  Filled 2013-04-15: qty 2

## 2013-04-15 MED ORDER — RIFABUTIN 150 MG PO CAPS
300.0000 mg | ORAL_CAPSULE | Freq: Every day | ORAL | Status: DC
  Administered 2013-04-15 – 2013-04-22 (×8): 300 mg via ORAL
  Filled 2013-04-15 (×8): qty 2

## 2013-04-15 MED ORDER — ETHAMBUTOL HCL 400 MG PO TABS
15.0000 mg/kg | ORAL_TABLET | Freq: Every day | ORAL | Status: DC
  Administered 2013-04-15 – 2013-04-20 (×6): 1400 mg via ORAL
  Filled 2013-04-15 (×6): qty 2

## 2013-04-15 MED ORDER — MOXIFLOXACIN HCL 400 MG PO TABS
400.0000 mg | ORAL_TABLET | Freq: Every day | ORAL | Status: DC
  Administered 2013-04-15 – 2013-04-21 (×7): 400 mg via ORAL
  Filled 2013-04-15 (×7): qty 1

## 2013-04-15 MED ORDER — VITAMIN B-6 50 MG PO TABS
50.0000 mg | ORAL_TABLET | Freq: Every day | ORAL | Status: DC
  Administered 2013-04-15 – 2013-04-22 (×8): 50 mg via ORAL
  Filled 2013-04-15 (×8): qty 1

## 2013-04-15 MED ORDER — SODIUM CHLORIDE 0.9 % IV BOLUS (SEPSIS)
500.0000 mL | Freq: Once | INTRAVENOUS | Status: AC
  Administered 2013-04-15: 500 mL via INTRAVENOUS

## 2013-04-15 MED ORDER — PYRAZINAMIDE 500 MG PO TABS
2000.0000 mg | ORAL_TABLET | Freq: Every day | ORAL | Status: DC
  Administered 2013-04-15 – 2013-04-20 (×6): 2000 mg via ORAL
  Filled 2013-04-15 (×6): qty 4

## 2013-04-15 NOTE — Progress Notes (Addendum)
[facility]    Subjective: Fever 103.1 at 0200 last night. Patient resting in bed, still with persistent productive cough of grey/brown sputum. She did report also having more sweats and chills last night as well. States she does feel mildly better than yesterday and is not as distressed as she was. Denies N/V/D, constipation, CP, palpations.   Antibiotics:  Anti-infectives   Start     Dose/Rate Route Frequency Ordered Stop   04/15/13 1000  ethambutol (MYAMBUTOL) tablet 1,400 mg     15 mg/kg  92.5 kg Oral Daily 04/15/13 0801     04/15/13 1000  rifampin (RIFADIN) capsule 600 mg     600 mg Oral Daily 04/15/13 0801     04/15/13 1000  pyrazinamide tablet 2,000 mg     2,000 mg Oral Daily 04/15/13 0801     04/15/13 1000  isoniazid (NYDRAZID) tablet 300 mg     300 mg Oral Daily 04/15/13 0801     04/15/13 1000  moxifloxacin (AVELOX) tablet 400 mg     400 mg Oral Daily 04/15/13 0801     04/13/13 1845  sulfamethoxazole-trimethoprim (BACTRIM DS) 800-160 MG per tablet 1 tablet     1 tablet Oral Daily 04/13/13 1813     04/11/13 2000  Ampicillin-Sulbactam (UNASYN) 3 g in sodium chloride 0.9 % 100 mL IVPB  Status:  Discontinued     3 g 100 mL/hr over 60 Minutes Intravenous Every 6 hours 04/11/13 1439 04/15/13 1007   04/11/13 1500  vancomycin (VANCOCIN) IVPB 1000 mg/200 mL premix  Status:  Discontinued     1,000 mg 200 mL/hr over 60 Minutes Intravenous Every 12 hours 04/11/13 1441 04/13/13 0935   04/10/13 1400  vancomycin (VANCOCIN) IVPB 1000 mg/200 mL premix  Status:  Discontinued     1,000 mg 200 mL/hr over 60 Minutes Intravenous Every 24 hours 04/09/13 1323 04/11/13 1441   04/09/13 2000  Ampicillin-Sulbactam (UNASYN) 3 g in sodium chloride 0.9 % 100 mL IVPB  Status:  Discontinued     3 g 100 mL/hr over 60 Minutes Intravenous Every 8 hours 04/09/13 1328 04/11/13 1439   04/09/13 1330  vancomycin (VANCOCIN) 1,250 mg in sodium chloride 0.9 % 250 mL IVPB     1,250  mg 166.7 mL/hr over 90 Minutes Intravenous STAT 04/09/13 1322 04/09/13 1538   04/09/13 1315  clindamycin (CLEOCIN) IVPB 600 mg     600 mg 100 mL/hr over 30 Minutes Intravenous  Once 04/09/13 1302 04/09/13 1359   04/09/13 0900  cefTRIAXone (ROCEPHIN) 1 g in dextrose 5 % 50 mL IVPB     1 g 100 mL/hr over 30 Minutes Intravenous  Once 04/09/13 0850 04/09/13 1042   04/09/13 0900  azithromycin (ZITHROMAX) 500 mg in dextrose 5 % 250 mL IVPB     500 mg 250 mL/hr over 60 Minutes Intravenous  Once 04/09/13 0850 04/09/13 1227      Medications: Scheduled Meds: . benzonatate  100 mg Oral TID  . dextromethorphan  30 mg Oral BID  . enoxaparin (LOVENOX) injection  40 mg Subcutaneous Q24H  . ethambutol  15 mg/kg Oral Daily  . furosemide  40 mg Oral Daily  . irbesartan  300 mg Oral Daily   And  . hydrochlorothiazide  12.5 mg Oral Daily  . isoniazid  300 mg Oral Daily  . moxifloxacin  400 mg Oral Daily  . pyrazinamide  2,000 mg Oral Daily  . vitamin B-6  50 mg Oral Daily  .  rifampin  600 mg Oral Daily  . sodium chloride  3 mL Intravenous Q12H  . sulfamethoxazole-trimethoprim  1 tablet Oral Daily   Continuous Infusions:  PRN Meds:.acetaminophen, acetaminophen, chlorpheniramine-HYDROcodone, ibuprofen, oxyCODONE   Objective: Weight change:   Intake/Output Summary (Last 24 hours) at 04/15/13 1040 Last data filed at 04/15/13 0939  Gross per 24 hour  Intake   1760 ml  Output    600 ml  Net   1160 ml   Blood pressure 123/77, pulse 104, temperature 98.8 F (37.1 C), temperature source Oral, resp. rate 21, height 5\' 2"  (1.575 m), weight 92.534 kg (204 lb), last menstrual period 03/09/2013, SpO2 95.00%. Temp:  [97.3 F (36.3 C)-103.1 F (39.5 C)] 98.8 F (37.1 C) (08/20 0527) Pulse Rate:  [92-118] 104 (08/20 0527) Resp:  [19-21] 21 (08/20 0527) BP: (115-134)/(72-80) 123/77 mmHg (08/20 0527) SpO2:  [95 %-98 %] 95 % (08/20 0527)  Physical Exam: General: Alert and awake, oriented x3, not in  any acute distress. HEENT: anicteric sclera, pupils reactive to light and accommodation, EOMI CVS regular rate, normal r,  no murmur rubs or gallops Chest: decreased breath sounds LUL, coarse breath sounds throughout, expiratory wheezes B/L Abdomen: soft nontender, nondistended, normal bowel sounds Extremities: trace edema B/L LE and improving Skin: no rashes Lymph: no new lymphadenopathy Neuro: nonfocal  Lab Results:  Recent Labs  04/13/13 0525 04/15/13 0500  WBC 19.7* 23.2*  HGB 9.2* 8.7*  HCT 25.7* 23.9*  PLT 279 363    BMET  Recent Labs  04/13/13 0525 04/15/13 0500  NA 132* 137  K 3.4* 3.8  CL 103 107  CO2 18* 18*  GLUCOSE 123* 90  BUN 12 12  CREATININE 1.21* 1.24*  CALCIUM 8.6 8.1*    Micro Results: Recent Results (from the past 240 hour(s))  CULTURE, BLOOD (ROUTINE X 2)     Status: None   Collection Time    04/09/13  8:55 AM      Result Value Range Status   Specimen Description BLOOD RIGHT ANTECUBITAL   Final   Special Requests BOTTLES DRAWN AEROBIC ONLY   Final   Culture  Setup Time     Final   Value: 04/09/2013 17:08     Performed at [facility]   Culture     Final   Value: NO GROWTH 5 DAYS     Performed at [facility]   Report Status 04/15/2013 FINAL   Final  CULTURE, BLOOD (ROUTINE X 2)     Status: None   Collection Time    04/09/13  9:02 AM      Result Value Range Status   Specimen Description BLOOD ARM RIGHT   Final   Special Requests BOTTLES DRAWN AEROBIC ONLY   Final   Culture  Setup Time     Final   Value: 04/09/2013 17:07     Performed at [facility]   Culture     Final   Value: NO GROWTH 5 DAYS     Performed at [facility]   Report Status 04/15/2013 FINAL   Final  MRSA PCR SCREENING     Status: None   Collection Time    04/09/13  3:05 PM      Result Value Range Status   MRSA by PCR NEGATIVE  NEGATIVE Final   Comment:            The GeneXpert MRSA Assay (FDA     approved for NASAL  specimens     only), is one component of a     comprehensive MRSA colonization     surveillance program. It is not     intended to diagnose MRSA     infection nor to guide or     monitor treatment for     MRSA infections.  CULTURE, EXPECTORATED SPUTUM-ASSESSMENT     Status: None   Collection Time    04/09/13  5:18 PM      Result Value Range Status   Specimen Description SPUTUM   Final   Special Requests Normal   Final   Sputum evaluation     Final   Value: MICROSCOPIC FINDINGS SUGGEST THAT THIS SPECIMEN IS NOT REPRESENTATIVE OF LOWER RESPIRATORY SECRETIONS. PLEASE RECOLLECT.     CALLED TO RN S.MERLINI AT 0053 04/10/13 BY L.PITT   Report Status 04/10/2013 FINAL   Final  CULTURE, EXPECTORATED SPUTUM-ASSESSMENT     Status: None   Collection Time    04/10/13  3:04 AM      Result Value Range Status   Specimen Description SPUTUM   Final   Special Requests NONE   Final   Sputum evaluation     Final   Value: MICROSCOPIC FINDINGS SUGGEST THAT THIS SPECIMEN IS NOT REPRESENTATIVE OF LOWER RESPIRATORY SECRETIONS. PLEASE RECOLLECT.     CALLED TO MERLINI,S RN 04/10/2013 0336 JORDANS   Report Status 04/10/2013 FINAL   Final  AFB CULTURE WITH SMEAR     Status: None   Collection Time    04/10/13 11:00 AM      Result Value Range Status   Specimen Description SPUTUM   Final   Special Requests NONE   Final   ACID FAST SMEAR     Final   Value: NO ACID FAST BACILLI SEEN     Performed at [facility]   Culture     Final   Value: CULTURE WILL BE EXAMINED FOR 6 WEEKS BEFORE ISSUING A FINAL REPORT     Performed at [facility]   Report Status PENDING   Incomplete  AFB CULTURE WITH SMEAR     Status: None   Collection Time    04/11/13 11:42 AM      Result Value Range Status   Specimen Description SPUTUM   Final   Special Requests Immunocompromised   Final   ACID FAST SMEAR     Final   Value: NO ACID FAST BACILLI SEEN     Performed at [facility]   Culture     Final    Value: CULTURE WILL BE EXAMINED FOR 6 WEEKS BEFORE ISSUING A FINAL REPORT     Performed at [facility]   Report Status PENDING   Incomplete  AFB CULTURE WITH SMEAR     Status: None   Collection Time    04/12/13  3:04 PM      Result Value Range Status   Specimen Description NASOPHARYNGEAL   Final   Special Requests Immunocompromised   Final   ACID FAST SMEAR     Final   Value: NO ACID FAST BACILLI SEEN     Performed at [facility]   Culture     Final   Value: CULTURE WILL BE EXAMINED FOR 6 WEEKS BEFORE ISSUING A FINAL REPORT     Performed at [facility]   Report Status PENDING   Incomplete  AFB CULTURE WITH SMEAR     Status: None   Collection  Time    04/13/13  5:40 PM      Result Value Range Status   Specimen Description BRONCHIAL ALVEOLAR LAVAGE   Final   Special Requests Immunocompromised   Final   ACID FAST SMEAR     Final   Value: NO ACID FAST BACILLI SEEN     Performed at [facility]   Culture     Final   Value: CULTURE WILL BE EXAMINED FOR 6 WEEKS BEFORE ISSUING A FINAL REPORT     Performed at [facility]   Report Status PENDING   Incomplete  FUNGUS CULTURE W SMEAR     Status: None   Collection Time    04/13/13  5:40 PM      Result Value Range Status   Specimen Description BRONCHIAL ALVEOLAR LAVAGE   Final   Special Requests Immunocompromised   Final   Fungal Smear     Final   Value: RARE YEAST     Performed at [facility]   Culture     Final   Value: YEAST ISOLATED;ID TO FOLLOW     Performed at [facility]   Report Status PENDING   Incomplete  LEGIONELLA CULTURE     Status: None   Collection Time    04/13/13  5:40 PM      Result Value Range Status   Specimen Description BRONCHIAL ALVEOLAR LAVAGE   Final   Special Requests Immunocompromised   Final   Culture     Final   Value: NO LEGIONELLA ISOLATED, CULTURE IN PROGRESS FOR 5 DAYS     Performed at [facility]   Report Status PENDING    Incomplete  RESPIRATORY VIRUS PANEL     Status: None   Collection Time    04/13/13  5:40 PM      Result Value Range Status   Source - RVPAN BRONCHIAL ALVEOLAR LAVAGE   Corrected   Comment: CORRECTED ON 08/19 AT 1841: PREVIOUSLY REPORTED AS BRONCHIAL ALVEOLAR LAVAGE   Respiratory Syncytial Virus A NOT DETECTED   Final   Respiratory Syncytial Virus B NOT DETECTED   Final   Influenza A NOT DETECTED   Final   Influenza B NOT DETECTED   Final   Parainfluenza 1 NOT DETECTED   Final   Parainfluenza 2 NOT DETECTED   Final   Parainfluenza 3 NOT DETECTED   Final   Metapneumovirus NOT DETECTED   Final   Rhinovirus NOT DETECTED   Final   Adenovirus NOT DETECTED   Final   Influenza A H1 NOT DETECTED   Final   Influenza A H3 NOT DETECTED   Final   Comment: (NOTE)           Normal Reference Range for each Analyte: NOT DETECTED     Testing performed using the Luminex xTAG Respiratory Viral Panel test     kit.     This test was developed and its performance characteristics determined     by [facility]. It has not been cleared or approved by the [place]     Food and Drug Administration. This test is used for clinical purposes.     It should not be regarded as investigational or for research. This     laboratory is certified under the Clinical Laboratory Improvement     Amendments of 1988 (CLIA) as qualified to perform high complexity     clinical laboratory testing.     Performed at [facility]  CULTURE, BAL-QUANTITATIVE     Status: None   Collection Time    04/13/13  5:40 PM      Result Value Range Status   Specimen Description BRONCHIAL ALVEOLAR LAVAGE   Final   Special Requests NONE   Final   Gram Stain     Final   Value: RARE WBC PRESENT, PREDOMINANTLY PMN     NO SQUAMOUS EPITHELIAL CELLS SEEN     NO ORGANISMS SEEN     Performed at [facility] Count PENDING   Incomplete   Culture     Final   Value: CANDIDA ALBICANS     Performed at [facility]     Report Status PENDING   Incomplete  CLOSTRIDIUM DIFFICILE BY PCR     Status: None   Collection Time    04/14/13  2:26 PM      Result Value Range Status   C difficile by pcr NEGATIVE  NEGATIVE Final    Studies/Results: No results found.    Assessment/Plan: Monique [name] is a 42 y.o. female with HTN who presented with fevers/chills, left sided substernal chest pain TTP. Chest CT consistent with pneumonia and LUL abscess with need for f/u to confirm clearance and exclude any malignancy. HIV positive and she has been placed on airborne precautions until TB workup concludes.   Pneumonia, with abscess:  --continue unasyn per pharmacy for narrowed coverage, appreciate pharmacist assistance in the management of this patient  --f/u blood cx, NGTD  --Bronch performed yesterday afternoon, will f/u results (see TB)  --f/u respiratory virus panel  --will continue to follow abscess response to abx and consider needle aspiration if needed   HIV: Confirmatory testing with Western Blot, POSITIVE. Wishes for NO INFORMATION TO BE DISCUSSED WITH FAMILY PRESENT  --CD4 = 70  --VL = 73,711  --Crypto Ag, neg  --HepC Ab, neg  -- Acute Hep panel, Negative  --C. Diff: negative --HLA B5701, pending  --HIV genotype pending  --f/u PCP smear, legionella  --Fungus culture w/ smear: rare yeast --With treatment of pulmonary TB will need to wait 2 weeks prior to initiation of HAART to decrease risk of IRIS  --continue Bactrim for PCP ppx   Rule out TB:  --Quantiferon Gold POSITIVE  --AFB sputum x 3, negative  --BAL: NGTD --continue airborne precautions despite AFB neg x 3, given her HIV status and low CD4 (70), positive quantiferon, and high clinical suspicion decision to treat for active pulmonary TB is made. --d/c unasyn and begin avelox to cover strep plus anerobe coverage --begin rifabutin, INH, B6, pyrazinamide, ethambutol    LOS: 6 days   Lewie Chamber 04/15/2013, 10:40 AM   INFECTIOUS  DISEASE ATTENDING ADDENDUM:     [facility]   Date: 04/16/2013  Patient name: Monique [name]  Medical record number: [id number]  Date of birth: 1970-11-28    This patient has been seen and discussed with the house staff. Please see their note for complete details. I concur with their findings with the following additions/corrections:  Patient still fevering through MTB rx and rx for strep, anerobes  I am calling [facility] to clarify the PCP smear from Bronchoscopy and MTB PCR (latter was definitely sent out but I am getting multiple referrals within [facility] trying to elucidate if the PCP that is "in process" per EPIC is actually being done  IF it isnt then I agree we should try to get another specimen for PCP  smear expectorated  I will repeat CXR 2 view  With re to her airborne isolation, I discussed with my ID partner, Dr Drue Second MD Director of  IP and she would prefer to leave her on airborne at present  Given pts worsening renal fxn I took her off her diuretic and ACE since I am concerned for pre-renal failure  Would start norvasc and use a prn drug that wont risk her becoming dehydrated and pre-renal  [name] 04/16/2013, 11:58 AM

## 2013-04-15 NOTE — Progress Notes (Signed)
INFECTIOUS DISEASE ATTENDING ADDENDUM:     [facility]   Date: 04/15/2013  Patient name: Monique [name]  Medical record number: [id number]  Date of birth: 07-21-1971    This patient has been seen and discussed with the house staff. Please see their note for complete details. I concur with their findings with the following additions/corrections:  PUZZLING case.   Continue with 4 drug rx for Presumed MTB AND FOR possible bacterial lung abscess (with uanasyn and then avelox when available)  Followup clinical status (still febrile)  Followup bronchoscopy studies, PCP smears still pending  Candida in BAl not relevant, but if she has thrush will treat that  Needs ARV started in 2 weeks. Cassandra from [facility] came to help pt fill out ADAP application and I agree with CM that pt should apply for Medicaid.  [name] 04/15/2013, 8:29 PM

## 2013-04-15 NOTE — Progress Notes (Signed)
PULMONARY  / CRITICAL CARE MEDICINE  Name: Monique [name] MRN: [id number] DOB: September 06, 1970    ADMISSION DATE:  04/09/2013 CONSULTATION DATE:  04/09/13   REFERRING MD :  ED MD PRIMARY SERVICE:  TRH  CHIEF COMPLAINT:  Chest pain x 1 day  BRIEF PATIENT DESCRIPTION:  42 y/o F with hx of HTN and former smoker presents to the [facility] ED with complaints of chest pain x 1 day. PCCM was called for consult for abnormal CT.  SIGNIFICANT EVENTS / STUDIES:  8/14>>>ED 8/14 CT Chest>>> Consolidative findings in the left upper lobe are most consistent with pneumonia and associated central abscess.   LINES / TUBES: PIV  CULTURES: 8/14 UC>>> 8/14 HIV positive. 8/18: AFB SMEAR  Neg x 2 but Quantiferon gold positive. HIV reactive.  04/13/13 - BRONCH LUL BAL  - AFB smear neg (MTB DNA PROBE _ PENDING), RARE YEAST CANDIDA ALBICANS,  LEG - NEG, RES VIRUS PCR MULTIPLEX - NEG, PCP - PENDING  ANTIBIOTICS: Per ID  SUBJECTIVE/OVERNIGHT/INTERVAL HX  Fevers +. Noted ID has her on ATT   VITAL SIGNS: Temp:  [97.3 F (36.3 C)-103.1 F (39.5 C)] 98.8 F (37.1 C) (08/20 0527) Pulse Rate:  [92-118] 104 (08/20 0527) Resp:  [19-21] 21 (08/20 0527) BP: (115-134)/(72-80) 123/77 mmHg (08/20 0527) SpO2:  [95 %-98 %] 95 % (08/20 0527)  PHYSICAL EXAMINATION: General:  WDWN female, in NAD Neuro:  No focal neuro deficits Oral: Very poor dentition on posterior aspects of lower jaw teeth bilaterally. HEENT:  PERRL Cardiovascular:  RRR, no m/r/g Lungs: rhonchi heard in LUL area, R lung CTA, resp's even bil Abdomen:  BS+, no masses, nontender, ND Musculoskeletal:  No gross deformities noted Skin:  Intact   PULMONARY No results found for this basename: PHART, PCO2, PCO2ART, PO2, PO2ART, HCO3, TCO2, O2SAT,  in the last 168 hours  CBC  Recent Labs Lab 04/11/13 0500 04/13/13 0525 04/15/13 0500  HGB 9.6* 9.2* 8.7*  HCT 26.6* 25.7* 23.9*  WBC 12.4* 19.7* 23.2*  PLT 221 279 363    COAGULATION No results  found for this basename: INR,  in the last 168 hours  CARDIAC  No results found for this basename: TROPONINI,  in the last 168 hours No results found for this basename: PROBNP,  in the last 168 hours   CHEMISTRY  Recent Labs Lab 04/09/13 0901 04/09/13 1530 04/10/13 0535 04/11/13 0500 04/13/13 0525 04/15/13 0500  NA 125* 128* 132* 134* 132* 137  K 2.6* 3.6 4.0 4.2 3.4* 3.8  CL 95* 98 105 107 103 107  CO2 18* 18* 16* 17* 18* 18*  GLUCOSE 129* 93 100* 121* 123* 90  BUN 17 15 18 15 12 12   CREATININE 1.65* 1.48* 1.45* 1.18* 1.21* 1.24*  CALCIUM 8.6 8.0* 7.7* 8.2* 8.6 8.1*  MG 1.6  --   --   --   --   --    Estimated Creatinine Clearance: 62.6 ml/min (by C-G formula based on Cr of 1.24).   LIVER  Recent Labs Lab 04/09/13 0901 04/11/13 0500  AST 38* 72*  ALT 22 35  ALKPHOS 110 97  BILITOT 0.6 0.3  PROT 7.6 6.8  ALBUMIN 2.2* 1.7*     INFECTIOUS  Recent Labs Lab 04/09/13 0908  LATICACIDVEN 1.02     ENDOCRINE CBG (last 3)  No results found for this basename: GLUCAP,  in the last 72 hours       IMAGING x48h  No results found.  ASSESSMENT / PLAN:  A: LUL pulmonary abscess likely due to poor dentition vs. ? TB exposure Poor dentition with multiple caries.  HIV positive. CD4 70 Etiology still unclear Quantiferon gold positive but Sutum and BAL AFb smear - neg (DNA prob - pending) PCP - pending Growing some candida    P:  -  Per ID - PCCM will sign off - IF mass does not decrease over weeks, will need biopsyl; reconsult [place] at that time     Dr. Kalman Shan, M.D., [facility].C.P Pulmonary and Critical Care Medicine Staff Physician [facility] Pager: [phone], If no answer or between  15:00h - 7:00h: call [phone]  04/15/2013 11:29 AM

## 2013-04-15 NOTE — Progress Notes (Signed)
Md notified of pt's temp of 103. New orders received.

## 2013-04-15 NOTE — Progress Notes (Signed)
Patient ID: Monique [name]  female  WUJ:[id number]    DOB: 23-Apr-1971    DOA: 04/09/2013  PCP: Default, Provider, MD  Assessment/Plan:   Sepsis : Likely due to lung abscess and pneumonia - Started on RIPE +avelox today, cont bactrim, still spiking fevers overnight, white count trending up - C. difficile negative   Acute respiratory failure with hypoxia/Pneumonia with left upper lung abscess: Still on 4 L O2 via nasal cannula  -3 AFB's are negative,Quantiferon Gold positive, concern given new dx HIV this may be opportunistic infection  -ID and pulmonology following. Bronched on 08/18 - d/w Dr. Algis Liming, possibility of pulmonary TB with HIV, the probability is high.  - Started today by ID on ethambutol, INH,  Pyrazinamide, rifampin, pyridoxine, and Avelox to cover strep plus anerobe coverage. - will need to wait for 2 weeks prior to initiation of HAART -Per ID, continue airborne precautions even if there is negative sputum - on Bactrim for PCP prophylaxis  Acute kidney injury/ Dehydration with hyponatremia  Creatinine function normalized  Human immunodeficiency virus (HIV) disease CD4 = 70, VL = 73,711  Crypto Ag, neg  HIV genotype pending - PT DOES NOT WANT THIS DIAGNOSIS DISCUSSED WITH ANY FRIENDS OR FAMILY.  Peripheral edema: Likely secondary to hypoalbuminemia, albumin 1.7 on 8/16  DVT Prophylaxis: Lovenox  Code Status: Full code  Disposition:    Subjective: Still spiking fevers at night with chills, seen this morning. Feeling better.  Objective: Weight change:   Intake/Output Summary (Last 24 hours) at 04/15/13 1347 Last data filed at 04/15/13 0939  Gross per 24 hour  Intake   1520 ml  Output    600 ml  Net    920 ml   Blood pressure 123/77, pulse 104, temperature 98.8 F (37.1 C), temperature source Oral, resp. rate 21, height 5\' 2"  (1.575 m), weight 92.534 kg (204 lb), last menstrual period 03/09/2013, SpO2 95.00%.  Physical Exam: General: Alert and awake,  oriented x3, not in any acute distress. CVS: S1-S2 clear Chest: dec breath sounds bilaterally Abdomen: soft NT, ND, NBS  xtremities: no cyanosis, clubbing. 1 + edema noted bilaterally   Lab Results: Basic Metabolic Panel:  Recent Labs Lab 04/09/13 0901  04/13/13 0525 04/15/13 0500  NA 125*  < > 132* 137  K 2.6*  < > 3.4* 3.8  CL 95*  < > 103 107  CO2 18*  < > 18* 18*  GLUCOSE 129*  < > 123* 90  BUN 17  < > 12 12  CREATININE 1.65*  < > 1.21* 1.24*  CALCIUM 8.6  < > 8.6 8.1*  MG 1.6  --   --   --   < > = values in this interval not displayed. Liver Function Tests:  Recent Labs Lab 04/09/13 0901 04/11/13 0500  AST 38* 72*  ALT 22 35  ALKPHOS 110 97  BILITOT 0.6 0.3  PROT 7.6 6.8  ALBUMIN 2.2* 1.7*   No results found for this basename: LIPASE, AMYLASE,  in the last 168 hours No results found for this basename: AMMONIA,  in the last 168 hours CBC:  Recent Labs Lab 04/09/13 0901  04/13/13 0525 04/15/13 0500  WBC 13.7*  < > 19.7* 23.2*  NEUTROABS 11.4*  --   --   --   HGB 10.4*  < > 9.2* 8.7*  HCT 28.3*  < > 25.7* 23.9*  MCV 81.6  < > 80.3 81.0  PLT 190  < > 279 363  < > =  values in this interval not displayed. Cardiac Enzymes: No results found for this basename: CKTOTAL, CKMB, CKMBINDEX, TROPONINI,  in the last 168 hours BNP: No components found with this basename: POCBNP,  CBG: No results found for this basename: GLUCAP,  in the last 168 hours   Micro Results: Recent Results (from the past 240 hour(s))  CULTURE, BLOOD (ROUTINE X 2)     Status: None   Collection Time    04/09/13  8:55 AM      Result Value Range Status   Specimen Description BLOOD RIGHT ANTECUBITAL   Final   Special Requests BOTTLES DRAWN AEROBIC ONLY   Final   Culture  Setup Time     Final   Value: 04/09/2013 17:08     Performed at [facility]   Culture     Final   Value: NO GROWTH 5 DAYS     Performed at [facility]   Report Status 04/15/2013 FINAL   Final   CULTURE, BLOOD (ROUTINE X 2)     Status: None   Collection Time    04/09/13  9:02 AM      Result Value Range Status   Specimen Description BLOOD ARM RIGHT   Final   Special Requests BOTTLES DRAWN AEROBIC ONLY   Final   Culture  Setup Time     Final   Value: 04/09/2013 17:07     Performed at [facility]   Culture     Final   Value: NO GROWTH 5 DAYS     Performed at [facility]   Report Status 04/15/2013 FINAL   Final  MRSA PCR SCREENING     Status: None   Collection Time    04/09/13  3:05 PM      Result Value Range Status   MRSA by PCR NEGATIVE  NEGATIVE Final   Comment:            The GeneXpert MRSA Assay (FDA     approved for NASAL specimens     only), is one component of a     comprehensive MRSA colonization     surveillance program. It is not     intended to diagnose MRSA     infection nor to guide or     monitor treatment for     MRSA infections.  CULTURE, EXPECTORATED SPUTUM-ASSESSMENT     Status: None   Collection Time    04/09/13  5:18 PM      Result Value Range Status   Specimen Description SPUTUM   Final   Special Requests Normal   Final   Sputum evaluation     Final   Value: MICROSCOPIC FINDINGS SUGGEST THAT THIS SPECIMEN IS NOT REPRESENTATIVE OF LOWER RESPIRATORY SECRETIONS. PLEASE RECOLLECT.     CALLED TO RN S.MERLINI AT 0053 04/10/13 BY L.PITT   Report Status 04/10/2013 FINAL   Final  CULTURE, EXPECTORATED SPUTUM-ASSESSMENT     Status: None   Collection Time    04/10/13  3:04 AM      Result Value Range Status   Specimen Description SPUTUM   Final   Special Requests NONE   Final   Sputum evaluation     Final   Value: MICROSCOPIC FINDINGS SUGGEST THAT THIS SPECIMEN IS NOT REPRESENTATIVE OF LOWER RESPIRATORY SECRETIONS. PLEASE RECOLLECT.     CALLED TO MERLINI,S RN 04/10/2013 0336 JORDANS   Report Status 04/10/2013 FINAL   Final  AFB CULTURE WITH SMEAR  Status: None   Collection Time    04/10/13 11:00 AM      Result Value Range  Status   Specimen Description SPUTUM   Final   Special Requests NONE   Final   ACID FAST SMEAR     Final   Value: NO ACID FAST BACILLI SEEN     Performed at [facility]   Culture     Final   Value: CULTURE WILL BE EXAMINED FOR 6 WEEKS BEFORE ISSUING A FINAL REPORT     Performed at [facility]   Report Status PENDING   Incomplete  AFB CULTURE WITH SMEAR     Status: None   Collection Time    04/11/13 11:42 AM      Result Value Range Status   Specimen Description SPUTUM   Final   Special Requests Immunocompromised   Final   ACID FAST SMEAR     Final   Value: NO ACID FAST BACILLI SEEN     Performed at [facility]   Culture     Final   Value: CULTURE WILL BE EXAMINED FOR 6 WEEKS BEFORE ISSUING A FINAL REPORT     Performed at [facility]   Report Status PENDING   Incomplete  AFB CULTURE WITH SMEAR     Status: None   Collection Time    04/12/13  3:04 PM      Result Value Range Status   Specimen Description NASOPHARYNGEAL   Final   Special Requests Immunocompromised   Final   ACID FAST SMEAR     Final   Value: NO ACID FAST BACILLI SEEN     Performed at [facility]   Culture     Final   Value: CULTURE WILL BE EXAMINED FOR 6 WEEKS BEFORE ISSUING A FINAL REPORT     Performed at [facility]   Report Status PENDING   Incomplete  AFB CULTURE WITH SMEAR     Status: None   Collection Time    04/13/13  5:40 PM      Result Value Range Status   Specimen Description BRONCHIAL ALVEOLAR LAVAGE   Final   Special Requests Immunocompromised   Final   ACID FAST SMEAR     Final   Value: NO ACID FAST BACILLI SEEN     Performed at [facility]   Culture     Final   Value: CULTURE WILL BE EXAMINED FOR 6 WEEKS BEFORE ISSUING A FINAL REPORT     Performed at [facility]   Report Status PENDING   Incomplete  FUNGUS CULTURE W SMEAR     Status: None   Collection Time    04/13/13  5:40 PM      Result Value Range Status   Specimen  Description BRONCHIAL ALVEOLAR LAVAGE   Final   Special Requests Immunocompromised   Final   Fungal Smear     Final   Value: RARE YEAST     Performed at [facility]   Culture     Final   Value: CANDIDA ALBICANS     Performed at [facility]   Report Status PENDING   Incomplete  LEGIONELLA CULTURE     Status: None   Collection Time    04/13/13  5:40 PM      Result Value Range Status   Specimen Description BRONCHIAL ALVEOLAR LAVAGE   Final   Special Requests Immunocompromised   Final  Culture     Final   Value: NO LEGIONELLA ISOLATED, CULTURE IN PROGRESS FOR 5 DAYS     Performed at [facility]   Report Status PENDING   Incomplete  RESPIRATORY VIRUS PANEL     Status: None   Collection Time    04/13/13  5:40 PM      Result Value Range Status   Source - RVPAN BRONCHIAL ALVEOLAR LAVAGE   Corrected   Comment: CORRECTED ON 08/19 AT 1841: PREVIOUSLY REPORTED AS BRONCHIAL ALVEOLAR LAVAGE   Respiratory Syncytial Virus A NOT DETECTED   Final   Respiratory Syncytial Virus B NOT DETECTED   Final   Influenza A NOT DETECTED   Final   Influenza B NOT DETECTED   Final   Parainfluenza 1 NOT DETECTED   Final   Parainfluenza 2 NOT DETECTED   Final   Parainfluenza 3 NOT DETECTED   Final   Metapneumovirus NOT DETECTED   Final   Rhinovirus NOT DETECTED   Final   Adenovirus NOT DETECTED   Final   Influenza A H1 NOT DETECTED   Final   Influenza A H3 NOT DETECTED   Final   Comment: (NOTE)           Normal Reference Range for each Analyte: NOT DETECTED     Testing performed using the Luminex xTAG Respiratory Viral Panel test     kit.     This test was developed and its performance characteristics determined     by [facility]. It has not been cleared or approved by the [place]     Food and Drug Administration. This test is used for clinical purposes.     It should not be regarded as investigational or for research. This     laboratory is certified under the Clinical  Laboratory Improvement     Amendments of 1988 (CLIA) as qualified to perform high complexity     clinical laboratory testing.     Performed at [facility]  CULTURE, BAL-QUANTITATIVE     Status: None   Collection Time    04/13/13  5:40 PM      Result Value Range Status   Specimen Description BRONCHIAL ALVEOLAR LAVAGE   Final   Special Requests NONE   Final   Gram Stain     Final   Value: RARE WBC PRESENT, PREDOMINANTLY PMN     NO SQUAMOUS EPITHELIAL CELLS SEEN     NO ORGANISMS SEEN     Performed at [facility] Count PENDING   Incomplete   Culture     Final   Value: CANDIDA ALBICANS     Performed at [facility]   Report Status PENDING   Incomplete  CLOSTRIDIUM DIFFICILE BY PCR     Status: None   Collection Time    04/14/13  2:26 PM      Result Value Range Status   C difficile by pcr NEGATIVE  NEGATIVE Final    Studies/Results: Ct Angio Chest Pe W/cm &/or Wo Cm  04/09/2013   *RADIOLOGY REPORT*  Clinical Data: Left upper chest pain and shortness of breath. Nonproductive cough and fever.  CT ANGIOGRAPHY CHEST  Technique:  Multidetector CT imaging of the chest using the standard protocol during bolus administration of intravenous contrast. Multiplanar reconstructed images including MIPs were obtained and reviewed to evaluate the vascular anatomy.  Contrast: OMNIPAQUE IOHEXOL 350 MG/ML SOLN  Comparison: Chest radiograph 04/09/2013.  Findings: No  pulmonary embolus.  Mediastinal lymph nodes measure up to 9 mm in the low right paratracheal station.  Axillary lymph nodes measure up to 13 mm on the left.  Heart is at the upper limits of normal in size to mildly enlarged.  No pericardial effusion.  Trace left pleural fluid.  Large area of airspace consolidation and surrounding inflammatory change in the left upper lobe with internal low attenuation and air.  Airway remains grossly patent. Minimal scattered peribronchovascular nodularity in the right lung.   Incidental imaging of the upper abdomen shows no acute findings. No worrisome lytic or sclerotic lesions.  IMPRESSION:  1.  Negative for pulmonary embolus. 2.  Consolidative findings in the left upper lobe are most consistent with pneumonia and associated central abscess. Follow-up to clearing is recommended as malignancy cannot be definitively excluded.  These results were called by telephone on 04/09/2013 at 1200 hours to Dr. Rubin Payor, who verbally acknowledged these results. 3.  Mediastinal and axillary lymph nodes may be reactive. 4.  Trace left pleural fluid.   Original Report Authenticated By: Leanna Battles, M.D.   Dg Chest Portable 1 View  04/09/2013   *RADIOLOGY REPORT*  Clinical Data: Chest pain  PORTABLE CHEST - 1 VIEW  Comparison: July 03, 2007  Findings:  There is a large area of consolidation with questionable mass in the left upper lobe.  This area of opacity measures 10.1 x 6.3 cm.  Right lung is clear.  Heart is mildly enlarged with normal pulmonary vascularity.  No adenopathy.  IMPRESSION: Consolidation with questionable mass left upper lobe.  Advise correlation with contrast enhanced chest CT to further evaluate. Right lung clear.  Mild cardiomegaly.   Original Report Authenticated By: Bretta Bang, M.D.    Medications: Scheduled Meds: . benzonatate  100 mg Oral TID  . dextromethorphan  30 mg Oral BID  . enoxaparin (LOVENOX) injection  40 mg Subcutaneous Q24H  . ethambutol  15 mg/kg Oral Daily  . furosemide  40 mg Oral Daily  . irbesartan  300 mg Oral Daily   And  . hydrochlorothiazide  12.5 mg Oral Daily  . isoniazid  300 mg Oral Daily  . moxifloxacin  400 mg Oral Daily  . pyrazinamide  2,000 mg Oral Daily  . vitamin B-6  50 mg Oral Daily  . rifampin  600 mg Oral Daily  . sodium chloride  3 mL Intravenous Q12H  . sulfamethoxazole-trimethoprim  1 tablet Oral Daily      LOS: 6 days   Pamla Pangle M.D. [facility] 04/15/2013, 1:47 PM Pager: [phone]  If  7PM-7AM, please contact night-coverage [email or web address] Password [email or web address]

## 2013-04-15 NOTE — Telephone Encounter (Signed)
I spoke with patient regarding no show for intake.  She was advised by [facility] [place] Counselor to call for reschedule and she did not so I am calling her again.  Patient agreed to schedule appointment.   I will notify DIS.   Laurell Josephs, RN

## 2013-04-16 ENCOUNTER — Encounter (HOSPITAL_COMMUNITY): Payer: Self-pay | Admitting: Radiology

## 2013-04-16 ENCOUNTER — Inpatient Hospital Stay (HOSPITAL_COMMUNITY): Payer: MEDICAID

## 2013-04-16 LAB — BASIC METABOLIC PANEL
BUN: 12 mg/dL (ref 6–23)
CO2: 19 mEq/L (ref 19–32)
Calcium: 8.7 mg/dL (ref 8.4–10.5)
Creatinine, Ser: 1.4 mg/dL — ABNORMAL HIGH (ref 0.50–1.10)
GFR calc non Af Amer: 46 mL/min — ABNORMAL LOW (ref >90)
Glucose, Bld: 100 mg/dL — ABNORMAL HIGH (ref 70–99)

## 2013-04-16 LAB — CBC
Hemoglobin: 8.4 g/dL — ABNORMAL LOW (ref 12.0–15.0)
MCH: 28.3 pg (ref 26.0–34.0)
MCHC: 35 g/dL (ref 30.0–36.0)
MCV: 80.8 fL (ref 78.0–100.0)
RBC: 2.97 MIL/uL — ABNORMAL LOW (ref 3.87–5.11)

## 2013-04-16 LAB — CULTURE, BAL-QUANTITATIVE W GRAM STAIN: Colony Count: 50000

## 2013-04-16 LAB — HIV 1/2 CONFIRMATION
HIV-1 antibody: POSITIVE
HIV-2 Ab: NEGATIVE

## 2013-04-16 MED ORDER — AMLODIPINE BESYLATE 10 MG PO TABS
10.0000 mg | ORAL_TABLET | Freq: Every day | ORAL | Status: DC
  Administered 2013-04-16 – 2013-04-22 (×7): 10 mg via ORAL
  Filled 2013-04-16 (×7): qty 1

## 2013-04-16 MED ORDER — WHITE PETROLATUM GEL
Status: AC
  Administered 2013-04-16: 16:00:00
  Filled 2013-04-16: qty 5

## 2013-04-16 NOTE — Progress Notes (Signed)
[facility]    Subjective: One episode of chills, sweats overnight. Cough improving but still productive of grey/brown sputum. C/o some aching, sore pain on her left side.   Antibiotics:  Anti-infectives   Start     Dose/Rate Route Frequency Ordered Stop   04/15/13 1600  rifabutin (MYCOBUTIN) capsule 300 mg     300 mg Oral Daily 04/15/13 1454     04/15/13 1000  ethambutol (MYAMBUTOL) tablet 1,400 mg     15 mg/kg  92.5 kg Oral Daily 04/15/13 0801     04/15/13 1000  rifampin (RIFADIN) capsule 600 mg  Status:  Discontinued     600 mg Oral Daily 04/15/13 0801 04/15/13 1454   04/15/13 1000  pyrazinamide tablet 2,000 mg     2,000 mg Oral Daily 04/15/13 0801     04/15/13 1000  isoniazid (NYDRAZID) tablet 300 mg     300 mg Oral Daily 04/15/13 0801     04/15/13 1000  moxifloxacin (AVELOX) tablet 400 mg     400 mg Oral Daily 04/15/13 0801     04/13/13 1845  sulfamethoxazole-trimethoprim (BACTRIM DS) 800-160 MG per tablet 1 tablet     1 tablet Oral Daily 04/13/13 1813     04/11/13 2000  Ampicillin-Sulbactam (UNASYN) 3 g in sodium chloride 0.9 % 100 mL IVPB  Status:  Discontinued     3 g 100 mL/hr over 60 Minutes Intravenous Every 6 hours 04/11/13 1439 04/15/13 1007   04/11/13 1500  vancomycin (VANCOCIN) IVPB 1000 mg/200 mL premix  Status:  Discontinued     1,000 mg 200 mL/hr over 60 Minutes Intravenous Every 12 hours 04/11/13 1441 04/13/13 0935   04/10/13 1400  vancomycin (VANCOCIN) IVPB 1000 mg/200 mL premix  Status:  Discontinued     1,000 mg 200 mL/hr over 60 Minutes Intravenous Every 24 hours 04/09/13 1323 04/11/13 1441   04/09/13 2000  Ampicillin-Sulbactam (UNASYN) 3 g in sodium chloride 0.9 % 100 mL IVPB  Status:  Discontinued     3 g 100 mL/hr over 60 Minutes Intravenous Every 8 hours 04/09/13 1328 04/11/13 1439   04/09/13 1330  vancomycin (VANCOCIN) 1,250 mg in sodium chloride 0.9 % 250 mL IVPB     1,250 mg 166.7 mL/hr over 90 Minutes Intravenous  STAT 04/09/13 1322 04/09/13 1538   04/09/13 1315  clindamycin (CLEOCIN) IVPB 600 mg     600 mg 100 mL/hr over 30 Minutes Intravenous  Once 04/09/13 1302 04/09/13 1359   04/09/13 0900  cefTRIAXone (ROCEPHIN) 1 g in dextrose 5 % 50 mL IVPB     1 g 100 mL/hr over 30 Minutes Intravenous  Once 04/09/13 0850 04/09/13 1042   04/09/13 0900  azithromycin (ZITHROMAX) 500 mg in dextrose 5 % 250 mL IVPB     500 mg 250 mL/hr over 60 Minutes Intravenous  Once 04/09/13 0850 04/09/13 1227      Medications: Scheduled Meds: . benzonatate  100 mg Oral TID  . dextromethorphan  30 mg Oral BID  . enoxaparin (LOVENOX) injection  40 mg Subcutaneous Q24H  . ethambutol  15 mg/kg Oral Daily  . furosemide  40 mg Oral Daily  . irbesartan  300 mg Oral Daily   And  . hydrochlorothiazide  12.5 mg Oral Daily  . isoniazid  300 mg Oral Daily  . moxifloxacin  400 mg Oral Daily  . pyrazinamide  2,000 mg Oral Daily  . vitamin B-6  50 mg Oral Daily  . rifabutin  300 mg Oral Daily  . sodium chloride  3 mL Intravenous Q12H  . sulfamethoxazole-trimethoprim  1 tablet Oral Daily   Continuous Infusions:  PRN Meds:.acetaminophen, acetaminophen, chlorpheniramine-HYDROcodone, oxyCODONE   Objective: Weight change:   Intake/Output Summary (Last 24 hours) at 04/16/13 0920 Last data filed at 04/16/13 0516  Gross per 24 hour  Intake    840 ml  Output      0 ml  Net    840 ml   Blood pressure 142/83, pulse 112, temperature 100.4 F (38 C), temperature source Oral, resp. rate 20, height 5\' 2"  (1.575 m), weight 92.534 kg (204 lb), last menstrual period 03/09/2013, SpO2 96.00%. Temp:  [99 F (37.2 C)-102 F (38.9 C)] 100.4 F (38 C) (08/21 0643) Pulse Rate:  [100-112] 112 (08/21 0516) Resp:  [18-20] 20 (08/21 0516) BP: (127-159)/(76-83) 142/83 mmHg (08/21 0516) SpO2:  [96 %-98 %] 96 % (08/21 0516)  Physical Exam: General: Alert and awake, oriented x3, not in any acute distress. HEENT: anicteric sclera, pupils  reactive to light and accommodation, EOMI, NO ORAL THRUSH NOTED CVS regular rate, normal r,  no murmur rubs or gallops Chest: improving but decreased breath sounds LUL, coarse breath sounds throughout remaining left lung, R CTA Abdomen: soft, nondistended, normal bowel sounds, TTP in left flank with mild rebound tenderness Extremities: 2+ pitting edema B/L LE Skin: no rashes Lymph: no new lymphadenopathy Neuro: nonfocal  Lab Results:  Recent Labs  04/15/13 0500 04/16/13 0500  WBC 23.2* 19.7*  HGB 8.7* 8.4*  HCT 23.9* 24.0*  PLT 363 428*    BMET  Recent Labs  04/15/13 0500 04/16/13 0500  NA 137 134*  K 3.8 3.5  CL 107 102  CO2 18* 19  GLUCOSE 90 100*  BUN 12 12  CREATININE 1.24* 1.40*  CALCIUM 8.1* 8.7    Micro Results: Recent Results (from the past 240 hour(s))  CULTURE, BLOOD (ROUTINE X 2)     Status: None   Collection Time    04/09/13  8:55 AM      Result Value Range Status   Specimen Description BLOOD RIGHT ANTECUBITAL   Final   Special Requests BOTTLES DRAWN AEROBIC ONLY   Final   Culture  Setup Time     Final   Value: 04/09/2013 17:08     Performed at [facility]   Culture     Final   Value: NO GROWTH 5 DAYS     Performed at [facility]   Report Status 04/15/2013 FINAL   Final  CULTURE, BLOOD (ROUTINE X 2)     Status: None   Collection Time    04/09/13  9:02 AM      Result Value Range Status   Specimen Description BLOOD ARM RIGHT   Final   Special Requests BOTTLES DRAWN AEROBIC ONLY   Final   Culture  Setup Time     Final   Value: 04/09/2013 17:07     Performed at [facility]   Culture     Final   Value: NO GROWTH 5 DAYS     Performed at [facility]   Report Status 04/15/2013 FINAL   Final  MRSA PCR SCREENING     Status: None   Collection Time    04/09/13  3:05 PM      Result Value Range Status   MRSA by PCR NEGATIVE  NEGATIVE Final   Comment:  The GeneXpert MRSA Assay (FDA      approved for NASAL specimens     only), is one component of a     comprehensive MRSA colonization     surveillance program. It is not     intended to diagnose MRSA     infection nor to guide or     monitor treatment for     MRSA infections.  CULTURE, EXPECTORATED SPUTUM-ASSESSMENT     Status: None   Collection Time    04/09/13  5:18 PM      Result Value Range Status   Specimen Description SPUTUM   Final   Special Requests Normal   Final   Sputum evaluation     Final   Value: MICROSCOPIC FINDINGS SUGGEST THAT THIS SPECIMEN IS NOT REPRESENTATIVE OF LOWER RESPIRATORY SECRETIONS. PLEASE RECOLLECT.     CALLED TO RN S.MERLINI AT 0053 04/10/13 BY L.PITT   Report Status 04/10/2013 FINAL   Final  CULTURE, EXPECTORATED SPUTUM-ASSESSMENT     Status: None   Collection Time    04/10/13  3:04 AM      Result Value Range Status   Specimen Description SPUTUM   Final   Special Requests NONE   Final   Sputum evaluation     Final   Value: MICROSCOPIC FINDINGS SUGGEST THAT THIS SPECIMEN IS NOT REPRESENTATIVE OF LOWER RESPIRATORY SECRETIONS. PLEASE RECOLLECT.     CALLED TO MERLINI,S RN 04/10/2013 0336 JORDANS   Report Status 04/10/2013 FINAL   Final  AFB CULTURE WITH SMEAR     Status: None   Collection Time    04/10/13 11:00 AM      Result Value Range Status   Specimen Description SPUTUM   Final   Special Requests NONE   Final   ACID FAST SMEAR     Final   Value: NO ACID FAST BACILLI SEEN     Performed at [facility]   Culture     Final   Value: CULTURE WILL BE EXAMINED FOR 6 WEEKS BEFORE ISSUING A FINAL REPORT     Performed at [facility]   Report Status PENDING   Incomplete  AFB CULTURE WITH SMEAR     Status: None   Collection Time    04/11/13 11:42 AM      Result Value Range Status   Specimen Description SPUTUM   Final   Special Requests Immunocompromised   Final   ACID FAST SMEAR     Final   Value: NO ACID FAST BACILLI SEEN     Performed at [facility]    Culture     Final   Value: CULTURE WILL BE EXAMINED FOR 6 WEEKS BEFORE ISSUING A FINAL REPORT     Performed at [facility]   Report Status PENDING   Incomplete  AFB CULTURE WITH SMEAR     Status: None   Collection Time    04/12/13  3:04 PM      Result Value Range Status   Specimen Description NASOPHARYNGEAL   Final   Special Requests Immunocompromised   Final   ACID FAST SMEAR     Final   Value: NO ACID FAST BACILLI SEEN     Performed at [facility]   Culture     Final   Value: CULTURE WILL BE EXAMINED FOR 6 WEEKS BEFORE ISSUING A FINAL REPORT     Performed at [facility]   Report Status PENDING   Incomplete  AFB CULTURE WITH SMEAR     Status: None   Collection Time    04/13/13  5:40 PM      Result Value Range Status   Specimen Description BRONCHIAL ALVEOLAR LAVAGE   Final   Special Requests Immunocompromised   Final   ACID FAST SMEAR     Final   Value: NO ACID FAST BACILLI SEEN     Performed at [facility]   Culture     Final   Value: CULTURE WILL BE EXAMINED FOR 6 WEEKS BEFORE ISSUING A FINAL REPORT     Performed at [facility]   Report Status PENDING   Incomplete  FUNGUS CULTURE W SMEAR     Status: None   Collection Time    04/13/13  5:40 PM      Result Value Range Status   Specimen Description BRONCHIAL ALVEOLAR LAVAGE   Final   Special Requests Immunocompromised   Final   Fungal Smear     Final   Value: RARE YEAST     Performed at [facility]   Culture     Final   Value: CANDIDA ALBICANS     Performed at [facility]   Report Status PENDING   Incomplete  LEGIONELLA CULTURE     Status: None   Collection Time    04/13/13  5:40 PM      Result Value Range Status   Specimen Description BRONCHIAL ALVEOLAR LAVAGE   Final   Special Requests Immunocompromised   Final   Culture     Final   Value: NO LEGIONELLA ISOLATED, CULTURE IN PROGRESS FOR 5 DAYS     Performed at [facility]   Report Status  PENDING   Incomplete  RESPIRATORY VIRUS PANEL     Status: None   Collection Time    04/13/13  5:40 PM      Result Value Range Status   Source - RVPAN BRONCHIAL ALVEOLAR LAVAGE   Corrected   Comment: CORRECTED ON 08/19 AT 1841: PREVIOUSLY REPORTED AS BRONCHIAL ALVEOLAR LAVAGE   Respiratory Syncytial Virus A NOT DETECTED   Final   Respiratory Syncytial Virus B NOT DETECTED   Final   Influenza A NOT DETECTED   Final   Influenza B NOT DETECTED   Final   Parainfluenza 1 NOT DETECTED   Final   Parainfluenza 2 NOT DETECTED   Final   Parainfluenza 3 NOT DETECTED   Final   Metapneumovirus NOT DETECTED   Final   Rhinovirus NOT DETECTED   Final   Adenovirus NOT DETECTED   Final   Influenza A H1 NOT DETECTED   Final   Influenza A H3 NOT DETECTED   Final   Comment: (NOTE)           Normal Reference Range for each Analyte: NOT DETECTED     Testing performed using the Lum[employer] xTAG Respiratory Viral Panel test     kit.     This test was developed and its performance characteristics determined     by [facility]. It has not been cleared or approved by the [place]     Food and Drug Administration. This test is used for clinical purposes.     It should not be regarded as investigational or for research. This     laboratory is certified under the Clinical Laboratory Improvement     Amendments of 1988 (CLIA) as qualified to perform high complexity  clinical laboratory testing.     Performed at [facility]  CULTURE, BAL-QUANTITATIVE     Status: None   Collection Time    04/13/13  5:40 PM      Result Value Range Status   Specimen Description BRONCHIAL ALVEOLAR LAVAGE   Final   Special Requests NONE   Final   Gram Stain     Final   Value: RARE WBC PRESENT, PREDOMINANTLY PMN     NO SQUAMOUS EPITHELIAL CELLS SEEN     NO ORGANISMS SEEN     Performed at [facility] Count     Final   Value: 50,000 COLONIES/ML     Performed at [facility]   Culture     Final     Value: CANDIDA ALBICANS     Performed at [facility]   Report Status 04/16/2013 FINAL   Final  CLOSTRIDIUM DIFFICILE BY PCR     Status: None   Collection Time    04/14/13  2:26 PM      Result Value Range Status   C difficile by pcr NEGATIVE  NEGATIVE Final    Studies/Results: No results found.    Assessment/Plan: Monique [name] is a 42 y.o. female with HTN who presented with fevers/chills, left sided substernal chest pain TTP. Chest CT consistent with pneumonia and LUL abscess with need for f/u to confirm clearance and exclude any malignancy. HIV positive and she has been placed on airborne precautions for TB despite negative AFB sputum.   Pneumonia, with abscess:  --continue avelox for strep + anerobe coverage --f/u blood cx, NGTD  --BAL with C. albicans however no significance and no oral thrush seen on exam  --Respiratory virus panel, negative  --will continue to follow abscess response to abx and consider needle aspiration if needed  --will consider needle aspiration of this abscess in setting of her continued fever trend  HIV: Confirmatory testing with Western Blot, POSITIVE. Wishes for NO INFORMATION TO BE DISCUSSED WITH FAMILY PRESENT  --CD4 = 70  --VL = 73,711  --Crypto Ag, neg  --HepC Ab, neg  -- Acute Hep panel, Negative  --C. Diff: negative  --HLA B5701, pending  --HIV genotype pending  --f/u PCP smear --Legionella smear, NTD --Fungus culture w/ smear: C. albicans --With treatment of pulmonary TB will need to wait 2 weeks prior to initiation of HAART to decrease risk of IRIS  --continue Bactrim for PCP ppx   Rule out TB:  --Quantiferon Gold POSITIVE  --AFB sputum x 3, negative  --BAL: NGTD  --continue airborne precautions despite AFB neg x 3, given her HIV status and low CD4 (70), positive quantiferon, and high clinical suspicion decision to treat for active pulmonary TB is made.   --continue rifabutin, INH, B6, pyrazinamide, ethambutol     LOS: 7 days   Lewie Chamber 04/16/2013, 9:20 AM

## 2013-04-16 NOTE — Progress Notes (Signed)
INFECTIOUS DISEASE ATTENDING ADDENDUM:     [facility]   Date: 04/16/2013  Patient name: Monique [name]  Medical record number: [id number]  Date of birth: 01-23-1971    This patient has been seen and discussed with the house staff. Please see their note for complete details. I concur with their findings with the following additions/corrections:  I have reviewed this case closely with colleagues in ID as well as with Dr. Vassie Loll and Dr. Marchelle Gearing. The patient CONTINUES to have fevers, and Now her CXR 2 view looks worse with areas of necrosis and cavitation in the lungs.    PCP smear is negative and MTB PCR is still pending for 04/19/13.  I am worried that she may have an underlying malignancy that could be leading to postobtructive picture that is progressing OR, that this is a truly difficult lung abscess that is in need of surgical attention to control it  I have discussed with CCM and Lonia Skinner, MD with IR and we will plan on CT guided biopsy for :  Pathology exam Bacterial cultures AFB cultures Fungal cultures MTB PCR from tissue also could be helpful   I will HOLD Off an any further broadening of her antibiotics at this time but post bronchoscopy may add in Zyvox to cover for MRSA  We may ultimately need help from CT surgery  I spent greater than 45 minutes with the patient including greater than 50% of time in face to face counsel of the patient and in coordination of their care including discussion of  Plans for biopsy.   [name] 04/16/2013, 3:42 PM

## 2013-04-16 NOTE — Progress Notes (Signed)
Patient ID: Monique [name]  female  ZOX:[id number]    DOB: 07-05-1971    DOA: 04/09/2013  PCP: Default, Provider, MD  Assessment/Plan:   Sepsis : Likely due to lung abscess and pneumonia - Started on RIPE +avelox on 8/20, cont bactrim, still spiking fevers overnight - C. difficile negative   Acute respiratory failure with hypoxia/Pneumonia with left upper lung abscess: Still on 4 L O2 via nasal cannula  -3 AFB's are negative,Quantiferon Gold positive, concern given new dx HIV this may be opportunistic infection  -ID and pulmonology following. Bronched on 08/18 - d/w Dr. Algis Liming, possibility of pulmonary TB with HIV, the probability is high.  - Started on ethambutol, INH,  Pyrazinamide, rifampin, pyridoxine, and Avelox to cover strep plus anerobe coverage. - will need to wait for 2 weeks prior to initiation of HAART - Per ID, continue airborne precautions even if there is negative sputum - on Bactrim for PCP prophylaxis  Acute kidney injury/ Dehydration with hyponatremia  Creatinine function normalized  Human immunodeficiency virus (HIV) disease CD4 = 70, VL = 73,711  Crypto Ag, neg  HIV genotype pending - PT DOES NOT WANT THIS DIAGNOSIS DISCUSSED WITH ANY FRIENDS OR FAMILY.  Peripheral edema: Likely secondary to hypoalbuminemia, albumin 1.7 on 8/16  DVT Prophylaxis: Lovenox  Code Status: Full code  Disposition:    Subjective: Still spiking fevers at night with chills, have mild abd pain on her left side. No nausea or vomiting. Cough improving  Objective: Weight change:   Intake/Output Summary (Last 24 hours) at 04/16/13 1440 Last data filed at 04/16/13 0516  Gross per 24 hour  Intake    600 ml  Output      0 ml  Net    600 ml   Blood pressure 136/105, pulse 130, temperature 99.9 F (37.7 C), temperature source Oral, resp. rate 20, height 5\' 2"  (1.575 m), weight 92.534 kg (204 lb), last menstrual period 03/09/2013, SpO2 96.00%.  Physical Exam: General: Alert and  awake, oriented x3, not in any acute distress. CVS: S1-S2 clear Chest: coarse BS Abdomen: soft normal bowel sounds, mild left flank tenderness  extremities: no cyanosis, clubbing. 1 + edema    Lab Results: Basic Metabolic Panel:  Recent Labs Lab 04/15/13 0500 04/16/13 0500  NA 137 134*  K 3.8 3.5  CL 107 102  CO2 18* 19  GLUCOSE 90 100*  BUN 12 12  CREATININE 1.24* 1.40*  CALCIUM 8.1* 8.7   Liver Function Tests:  Recent Labs Lab 04/11/13 0500  AST 72*  ALT 35  ALKPHOS 97  BILITOT 0.3  PROT 6.8  ALBUMIN 1.7*   No results found for this basename: LIPASE, AMYLASE,  in the last 168 hours No results found for this basename: AMMONIA,  in the last 168 hours CBC:  Recent Labs Lab 04/15/13 0500 04/16/13 0500  WBC 23.2* 19.7*  HGB 8.7* 8.4*  HCT 23.9* 24.0*  MCV 81.0 80.8  PLT 363 428*   Cardiac Enzymes: No results found for this basename: CKTOTAL, CKMB, CKMBINDEX, TROPONINI,  in the last 168 hours BNP: No components found with this basename: POCBNP,  CBG: No results found for this basename: GLUCAP,  in the last 168 hours   Micro Results: Recent Results (from the past 240 hour(s))  CULTURE, BLOOD (ROUTINE X 2)     Status: None   Collection Time    04/09/13  8:55 AM      Result Value Range Status   Specimen Description BLOOD RIGHT  ANTECUBITAL   Final   Special Requests BOTTLES DRAWN AEROBIC ONLY   Final   Culture  Setup Time     Final   Value: 04/09/2013 17:08     Performed at [facility]   Culture     Final   Value: NO GROWTH 5 DAYS     Performed at [facility]   Report Status 04/15/2013 FINAL   Final  CULTURE, BLOOD (ROUTINE X 2)     Status: None   Collection Time    04/09/13  9:02 AM      Result Value Range Status   Specimen Description BLOOD ARM RIGHT   Final   Special Requests BOTTLES DRAWN AEROBIC ONLY   Final   Culture  Setup Time     Final   Value: 04/09/2013 17:07     Performed at [facility]   Culture      Final   Value: NO GROWTH 5 DAYS     Performed at [facility]   Report Status 04/15/2013 FINAL   Final  MRSA PCR SCREENING     Status: None   Collection Time    04/09/13  3:05 PM      Result Value Range Status   MRSA by PCR NEGATIVE  NEGATIVE Final   Comment:            The GeneXpert MRSA Assay (FDA     approved for NASAL specimens     only), is one component of a     comprehensive MRSA colonization     surveillance program. It is not     intended to diagnose MRSA     infection nor to guide or     monitor treatment for     MRSA infections.  CULTURE, EXPECTORATED SPUTUM-ASSESSMENT     Status: None   Collection Time    04/09/13  5:18 PM      Result Value Range Status   Specimen Description SPUTUM   Final   Special Requests Normal   Final   Sputum evaluation     Final   Value: MICROSCOPIC FINDINGS SUGGEST THAT THIS SPECIMEN IS NOT REPRESENTATIVE OF LOWER RESPIRATORY SECRETIONS. PLEASE RECOLLECT.     CALLED TO RN S.MERLINI AT 0053 04/10/13 BY L.PITT   Report Status 04/10/2013 FINAL   Final  CULTURE, EXPECTORATED SPUTUM-ASSESSMENT     Status: None   Collection Time    04/10/13  3:04 AM      Result Value Range Status   Specimen Description SPUTUM   Final   Special Requests NONE   Final   Sputum evaluation     Final   Value: MICROSCOPIC FINDINGS SUGGEST THAT THIS SPECIMEN IS NOT REPRESENTATIVE OF LOWER RESPIRATORY SECRETIONS. PLEASE RECOLLECT.     CALLED TO MERLINI,S RN 04/10/2013 0336 JORDANS   Report Status 04/10/2013 FINAL   Final  AFB CULTURE WITH SMEAR     Status: None   Collection Time    04/10/13 11:00 AM      Result Value Range Status   Specimen Description SPUTUM   Final   Special Requests NONE   Final   ACID FAST SMEAR     Final   Value: NO ACID FAST BACILLI SEEN     Performed at [facility]   Culture     Final   Value: CULTURE WILL BE EXAMINED FOR 6 WEEKS BEFORE ISSUING A FINAL REPORT     Performed at [facility]   Report Status  PENDING   Incomplete  AFB CULTURE WITH SMEAR     Status: None   Collection Time    04/11/13 11:42 AM      Result Value Range Status   Specimen Description SPUTUM   Final   Special Requests Immunocompromised   Final   ACID FAST SMEAR     Final   Value: NO ACID FAST BACILLI SEEN     Performed at [facility]   Culture     Final   Value: CULTURE WILL BE EXAMINED FOR 6 WEEKS BEFORE ISSUING A FINAL REPORT     Performed at [facility]   Report Status PENDING   Incomplete  AFB CULTURE WITH SMEAR     Status: None   Collection Time    04/12/13  3:04 PM      Result Value Range Status   Specimen Description NASOPHARYNGEAL   Final   Special Requests Immunocompromised   Final   ACID FAST SMEAR     Final   Value: NO ACID FAST BACILLI SEEN     Performed at [facility]   Culture     Final   Value: CULTURE WILL BE EXAMINED FOR 6 WEEKS BEFORE ISSUING A FINAL REPORT     Performed at [facility]   Report Status PENDING   Incomplete  AFB CULTURE WITH SMEAR     Status: None   Collection Time    04/13/13  5:40 PM      Result Value Range Status   Specimen Description BRONCHIAL ALVEOLAR LAVAGE   Final   Special Requests Immunocompromised   Final   ACID FAST SMEAR     Final   Value: NO ACID FAST BACILLI SEEN     Performed at [facility]   Culture     Final   Value: CULTURE WILL BE EXAMINED FOR 6 WEEKS BEFORE ISSUING A FINAL REPORT     Performed at [facility]   Report Status PENDING   Incomplete  FUNGUS CULTURE W SMEAR     Status: None   Collection Time    04/13/13  5:40 PM      Result Value Range Status   Specimen Description BRONCHIAL ALVEOLAR LAVAGE   Final   Special Requests Immunocompromised   Final   Fungal Smear     Final   Value: RARE YEAST     Performed at [facility]   Culture     Final   Value: CANDIDA ALBICANS     Performed at [facility]   Report Status PENDING   Incomplete  LEGIONELLA CULTURE     Status:  None   Collection Time    04/13/13  5:40 PM      Result Value Range Status   Specimen Description BRONCHIAL ALVEOLAR LAVAGE   Final   Special Requests Immunocompromised   Final   Culture     Final   Value: NO LEGIONELLA ISOLATED, CULTURE IN PROGRESS FOR 5 DAYS     Performed at [facility]   Report Status PENDING   Incomplete  RESPIRATORY VIRUS PANEL     Status: None   Collection Time    04/13/13  5:40 PM      Result Value Range Status   Source - RVPAN BRONCHIAL ALVEOLAR LAVAGE   Corrected   Comment: CORRECTED ON 08/19 AT 1841: PREVIOUSLY REPORTED AS BRONCHIAL ALVEOLAR LAVAGE   Respiratory Syncytial  Virus A NOT DETECTED   Final   Respiratory Syncytial Virus B NOT DETECTED   Final   Influenza A NOT DETECTED   Final   Influenza B NOT DETECTED   Final   Parainfluenza 1 NOT DETECTED   Final   Parainfluenza 2 NOT DETECTED   Final   Parainfluenza 3 NOT DETECTED   Final   Metapneumovirus NOT DETECTED   Final   Rhinovirus NOT DETECTED   Final   Adenovirus NOT DETECTED   Final   Influenza A H1 NOT DETECTED   Final   Influenza A H3 NOT DETECTED   Final   Comment: (NOTE)           Normal Reference Range for each Analyte: NOT DETECTED     Testing performed using the Luminex xTAG Respiratory Viral Panel test     kit.     This test was developed and its performance characteristics determined     by [facility]. It has not been cleared or approved by the [place]     Food and Drug Administration. This test is used for clinical purposes.     It should not be regarded as investigational or for research. This     laboratory is certified under the Clinical Laboratory Improvement     Amendments of 1988 (CLIA) as qualified to perform high complexity     clinical laboratory testing.     Performed at [facility]  CULTURE, BAL-QUANTITATIVE     Status: None   Collection Time    04/13/13  5:40 PM      Result Value Range Status   Specimen Description BRONCHIAL ALVEOLAR LAVAGE    Final   Special Requests NONE   Final   Gram Stain     Final   Value: RARE WBC PRESENT, PREDOMINANTLY PMN     NO SQUAMOUS EPITHELIAL CELLS SEEN     NO ORGANISMS SEEN     Performed at [facility] Count     Final   Value: 50,000 COLONIES/ML     Performed at [facility]   Culture     Final   Value: CANDIDA ALBICANS     Performed at [facility]   Report Status 04/16/2013 FINAL   Final  CLOSTRIDIUM DIFFICILE BY PCR     Status: None   Collection Time    04/14/13  2:26 PM      Result Value Range Status   C difficile by pcr NEGATIVE  NEGATIVE Final    Studies/Results: Ct Angio Chest Pe W/cm &/or Wo Cm  04/09/2013   *RADIOLOGY REPORT*  Clinical Data: Left upper chest pain and shortness of breath. Nonproductive cough and fever.  CT ANGIOGRAPHY CHEST  Technique:  Multidetector CT imaging of the chest using the standard protocol during bolus administration of intravenous contrast. Multiplanar reconstructed images including MIPs were obtained and reviewed to evaluate the vascular anatomy.  Contrast: OMNIPAQUE IOHEXOL 350 MG/ML SOLN  Comparison: Chest radiograph 04/09/2013.  Findings: No pulmonary embolus.  Mediastinal lymph nodes measure up to 9 mm in the low right paratracheal station.  Axillary lymph nodes measure up to 13 mm on the left.  Heart is at the upper limits of normal in size to mildly enlarged.  No pericardial effusion.  Trace left pleural fluid.  Large area of airspace consolidation and surrounding inflammatory change in the left upper lobe with internal low attenuation and air.  Airway remains grossly patent.  Minimal scattered peribronchovascular nodularity in the right lung.  Incidental imaging of the upper abdomen shows no acute findings. No worrisome lytic or sclerotic lesions.  IMPRESSION:  1.  Negative for pulmonary embolus. 2.  Consolidative findings in the left upper lobe are most consistent with pneumonia and associated central abscess.  Follow-up to clearing is recommended as malignancy cannot be definitively excluded.  These results were called by telephone on 04/09/2013 at 1200 hours to Dr. Rubin Payor, who verbally acknowledged these results. 3.  Mediastinal and axillary lymph nodes may be reactive. 4.  Trace left pleural fluid.   Original Report Authenticated By: Leanna Battles, M.D.   Dg Chest Portable 1 View  04/09/2013   *RADIOLOGY REPORT*  Clinical Data: Chest pain  PORTABLE CHEST - 1 VIEW  Comparison: July 03, 2007  Findings:  There is a large area of consolidation with questionable mass in the left upper lobe.  This area of opacity measures 10.1 x 6.3 cm.  Right lung is clear.  Heart is mildly enlarged with normal pulmonary vascularity.  No adenopathy.  IMPRESSION: Consolidation with questionable mass left upper lobe.  Advise correlation with contrast enhanced chest CT to further evaluate. Right lung clear.  Mild cardiomegaly.   Original Report Authenticated By: Bretta Bang, M.D.    Medications: Scheduled Meds: . amLODipine  10 mg Oral Daily  . benzonatate  100 mg Oral TID  . dextromethorphan  30 mg Oral BID  . enoxaparin (LOVENOX) injection  40 mg Subcutaneous Q24H  . ethambutol  15 mg/kg Oral Daily  . isoniazid  300 mg Oral Daily  . moxifloxacin  400 mg Oral Daily  . pyrazinamide  2,000 mg Oral Daily  . vitamin B-6  50 mg Oral Daily  . rifabutin  300 mg Oral Daily  . sodium chloride  3 mL Intravenous Q12H  . sulfamethoxazole-trimethoprim  1 tablet Oral Daily      LOS: 7 days   RAI,RIPUDEEP M.D. [facility] 04/16/2013, 2:40 PM Pager: [phone]  If 7PM-7AM, please contact night-coverage [email or web address] Password [email or web address]

## 2013-04-16 NOTE — H&P (Signed)
HPI: Monique [name] is an 42 y.o. female with Acute respiratory failure with hypoxia/Pneumonia with left upper lung abscess. She continues to have fevers and worsening findings on CXR. ID feels CT biopsy/Cx would be beneficial to pt treatment. Reviewed and approved by, Dr. Deanne Coffer, IR is requested to perform CT guided Left lung mass biopsy. PMHx, chart, meds reviewed.    Past Medical History:  Past Medical History  Diagnosis Date  . Tonsillitis   . Hypertension     Past Surgical History:  Past Surgical History  Procedure Laterality Date  . Cesarean section    . Video bronchoscopy Bilateral 04/13/2013    Procedure: VIDEO BRONCHOSCOPY WITHOUT FLUORO;  Surgeon: Kalman Shan, MD;  Location: [facility] ENDOSCOPY;  Service: Cardiopulmonary;  Laterality: Bilateral;    Family History: No family history on file.  Social History:  reports that she has never smoked. She does not have any smokeless tobacco history on file. She reports that she does not drink alcohol or use illicit drugs.  Allergies: No Known Allergies  Medications:   Medication List    ASK your doctor about these medications       aspirin EC 325 MG tablet  Take 650 mg by mouth once. One time dose for chest pain     candesartan-hydrochlorothiazide 32-12.5 MG per tablet  Commonly known as:  ATACAND HCT  Take 1 tablet by mouth once.     ibuprofen 200 MG tablet  Commonly known as:  ADVIL,MOTRIN  Take 200 mg by mouth every 4 (four) hours as needed for pain.     multivitamin with minerals Tabs tablet  Take 1 tablet by mouth daily.        Please HPI for pertinent positives, otherwise complete 10 system ROS negative.  Physical Exam: BP 136/105  Pulse 130  Temp(Src) 99.9 F (37.7 C) (Oral)  Resp 20  Ht 5\' 2"  (1.575 m)  Wt 204 lb (92.534 kg)  BMI 37.3 kg/m2  SpO2 96%  LMP 03/09/2013 Body mass index is 37.3 kg/(m^2).   General Appearance:  Alert, cooperative, no distress, appears stated age  Head:   Normocephalic, without obvious abnormality, atraumatic  ENT: Unremarkable  Neck: Supple, symmetrical, trachea midline  Lungs:   Coarse Left BS  Chest Wall:  No tenderness or deformity  Heart:  Regular rate and rhythm, S1, S2 normal, no murmur, rub or gallop.  Neurologic: Normal affect, no gross deficits.   Results for orders placed during the hospital encounter of 04/09/13 (from the past 48 hour(s))  CBC     Status: Abnormal   Collection Time    04/15/13  5:00 AM      Result Value Range   WBC 23.2 (*) 4.0 - 10.5 K/uL   RBC 2.95 (*) 3.87 - 5.11 MIL/uL   Hemoglobin 8.7 (*) 12.0 - 15.0 g/dL   HCT 16.1 (*) 09.6 - 04.5 %   MCV 81.0  78.0 - 100.0 fL   MCH 29.5  26.0 - 34.0 pg   MCHC 36.4 (*) 30.0 - 36.0 g/dL   RDW 40.9  81.1 - 91.4 %   Platelets 363  150 - 400 K/uL   Comment: DELTA CHECK NOTED  BASIC METABOLIC PANEL     Status: Abnormal   Collection Time    04/15/13  5:00 AM      Result Value Range   Sodium 137  135 - 145 mEq/L   Potassium 3.8  3.5 - 5.1 mEq/L   Chloride 107  96 -  112 mEq/L   CO2 18 (*) 19 - 32 mEq/L   Glucose, Bld 90  70 - 99 mg/dL   BUN 12  6 - 23 mg/dL   Creatinine, Ser 1.61 (*) 0.50 - 1.10 mg/dL   Calcium 8.1 (*) 8.4 - 10.5 mg/dL   GFR calc non Af Amer 53 (*) >90 mL/min   GFR calc Af Amer 61 (*) >90 mL/min   Comment: (NOTE)     The eGFR has been calculated using the CKD EPI equation.     This calculation has not been validated in all clinical situations.     eGFR's persistently <90 mL/min signify possible Chronic Kidney     Disease.  CBC     Status: Abnormal   Collection Time    04/16/13  5:00 AM      Result Value Range   WBC 19.7 (*) 4.0 - 10.5 K/uL   RBC 2.97 (*) 3.87 - 5.11 MIL/uL   Hemoglobin 8.4 (*) 12.0 - 15.0 g/dL   HCT 09.6 (*) 04.5 - 40.9 %   MCV 80.8  78.0 - 100.0 fL   MCH 28.3  26.0 - 34.0 pg   MCHC 35.0  30.0 - 36.0 g/dL   RDW 81.1  91.4 - 78.2 %   Platelets 428 (*) 150 - 400 K/uL  BASIC METABOLIC PANEL     Status: Abnormal    Collection Time    04/16/13  5:00 AM      Result Value Range   Sodium 134 (*) 135 - 145 mEq/L   Potassium 3.5  3.5 - 5.1 mEq/L   Chloride 102  96 - 112 mEq/L   CO2 19  19 - 32 mEq/L   Glucose, Bld 100 (*) 70 - 99 mg/dL   BUN 12  6 - 23 mg/dL   Creatinine, Ser 9.56 (*) 0.50 - 1.10 mg/dL   Calcium 8.7  8.4 - 21.3 mg/dL   GFR calc non Af Amer 46 (*) >90 mL/min   GFR calc Af Amer 53 (*) >90 mL/min   Comment: (NOTE)     The eGFR has been calculated using the CKD EPI equation.     This calculation has not been validated in all clinical situations.     eGFR's persistently <90 mL/min signify possible Chronic Kidney     Disease.  LACTATE DEHYDROGENASE     Status: Abnormal   Collection Time    04/16/13  5:00 AM      Result Value Range   LDH 314 (*) 94 - 250 U/L   Dg Chest 2 View  04/16/2013   *RADIOLOGY REPORT*  Clinical Data: Reevaluate lung abscess versus TB or PCP.  Shortness of breath with chest pain and fever  CHEST - 2 VIEW  Comparison: 04/09/2013  Findings: Persistent area of consolidation is identified involving the superior segment of the left upper lobe. This appears slightly more extensive than on the prior exam. An interval increase in the degree of central cavitation is noted in comparison with the prior exam.  New associated pleural effusion/thickening is noted at the left costophrenic angle.  Increased density in the left lower lung zone may reflect associated atelectasis or spreading infection. The right lung remains clear.  Bony structures are intact.  IMPRESSION: Some expansion of the left upper lobe mass like area of consolidation with increasing central cavitation.  New increased density at the left base is questionably related to atelectasis or spread of infection with new left basilar pleural reaction/pleural  fluid.   Original Report Authenticated By: Rhodia Albright, M.D.    Assessment/Plan Left upper lung mass/abscess/PNA For CT guided biopsy Discussed procedure, risks  and complications, including infection, bleeding/hemorrhage, PTX, possibility of chest tube placement. Labs reviewed, will check coags. Consent signed in chart  Brayton El PA-C 04/16/2013, 4:24 PM

## 2013-04-17 ENCOUNTER — Inpatient Hospital Stay (HOSPITAL_COMMUNITY): Payer: MEDICAID

## 2013-04-17 DIAGNOSIS — J9383 Other pneumothorax: Secondary | ICD-10-CM

## 2013-04-17 LAB — CBC
HCT: 24.3 % — ABNORMAL LOW (ref 36.0–46.0)
MCV: 81.8 fL (ref 78.0–100.0)
Platelets: 455 10*3/uL — ABNORMAL HIGH (ref 150–400)
RBC: 2.97 MIL/uL — ABNORMAL LOW (ref 3.87–5.11)
RDW: 13.2 % (ref 11.5–15.5)
WBC: 15 10*3/uL — ABNORMAL HIGH (ref 4.0–10.5)

## 2013-04-17 LAB — BASIC METABOLIC PANEL
CO2: 21 mEq/L (ref 19–32)
Chloride: 102 mEq/L (ref 96–112)
Creatinine, Ser: 1.3 mg/dL — ABNORMAL HIGH (ref 0.50–1.10)
GFR calc Af Amer: 58 mL/min — ABNORMAL LOW (ref >90)
Potassium: 3.6 mEq/L (ref 3.5–5.1)

## 2013-04-17 LAB — PROTIME-INR: Prothrombin Time: 15.8 seconds — ABNORMAL HIGH (ref 11.6–15.2)

## 2013-04-17 LAB — APTT: aPTT: 42 seconds — ABNORMAL HIGH (ref 24–37)

## 2013-04-17 LAB — HIV-1 GENOTYPR PLUS

## 2013-04-17 MED ORDER — LINEZOLID 600 MG PO TABS
600.0000 mg | ORAL_TABLET | Freq: Two times a day (BID) | ORAL | Status: DC
  Administered 2013-04-17 – 2013-04-21 (×8): 600 mg via ORAL
  Filled 2013-04-17 (×9): qty 1

## 2013-04-17 MED ORDER — HYDROCODONE-ACETAMINOPHEN 5-325 MG PO TABS
1.0000 | ORAL_TABLET | ORAL | Status: DC | PRN
  Administered 2013-04-17: 1 via ORAL
  Filled 2013-04-17: qty 1

## 2013-04-17 MED ORDER — FENTANYL CITRATE 0.05 MG/ML IJ SOLN
INTRAMUSCULAR | Status: AC | PRN
  Administered 2013-04-17 (×2): 50 ug via INTRAVENOUS

## 2013-04-17 MED ORDER — MIDAZOLAM HCL 2 MG/2ML IJ SOLN
INTRAMUSCULAR | Status: AC | PRN
  Administered 2013-04-17 (×2): 1 mg via INTRAVENOUS

## 2013-04-17 NOTE — Progress Notes (Addendum)
Patient ID: Monique [name]  female  XBM:[id number]    DOB: 1971/05/26    DOA: 04/09/2013  PCP: Default, Provider, MD   Addendum:  CXR reviewed, done after the CT guided lung biopsy IMPRESSION:  Tiny left pneumothorax.  Will recheck 2 view chest x-ray tomorrow morning, to assess if any worsening/improvement of the pneumothorax.   Monique [name] M.D. [facility] 04/17/2013, 2:27 PM  Pager: [phone]     Interim summary 42 year old female patient with only significant medical history of hypertension and tobacco abuse. Presented to the emergency department with subjective fevers and chills for 3 days and nonproductive cough. This is been associated with anorexia for one week. The evening prior to presentation she had been cleaning her house she developed sharp pain in her left chest that was quite severe and was associated with shortness of breath she had no other constitutional symptoms. In the emergency department the patient was tachycardic with a heart rate of 120 and respiratory rate of 25 she had a fever of 102.8 and her O2 sats are 90% on room air. She had mild leukocytosis with a white count of 13,700 hemoglobin was 10.4 sodium 125 potassium 2.6 bicarbonate 18 creatinine 1.65. Portable chest x-ray was concerning for a mass in the left upper lobe. CT angiogram of the chest revealed consolidation in the left upper lobe consistent with pneumonia and a possible central pulmonary abscess. No evidence of pulmonary embolus. Because of her symptoms and vital signs she was given 3 L of normal saline and started on IV Rocephin and azithromycin followed by a dose of IV clindamycin and was admitted to the step down unit. Infectious disease and pulmonology was consulted.  Consults: Pulmonology- Dr. Marchelle Gearing Infectious disease Dr. Algis Liming Interventional radiology   Antibiotics Ethambutol   Isoniazid  Pyrazinamide Rifampin  pyridoxine  Avelox Bactrim 8/18  >>   Assessment/Plan:   Sepsis : Likely due to lung abscess and pneumonia - Chest x-ray done yesterday showed expansion of the left upper lobe masslike area of consolidation with increasing central cavitation.  - CT guided biopsy of the left lung mass done today - Started on RIPE +avelox on 8/20, cont bactrim, still spiking fevers overnight - C. difficile negative   Acute respiratory failure with hypoxia/Pneumonia with left upper lung abscess: Still on 4 L O2 via nasal cannula  -3 AFB's are negative,Quantiferon Gold positive, concern given new dx HIV, low CD4 70, decision was made by infectious disease to treat for active pulmonary TB.  -  Bronched on 08/18, BAL with C. albicans however no significance, no oral thrush seen on exam, benign reactive changes on the bronchial washing - Cont ethambutol, INH,  Pyrazinamide, rifampin, pyridoxine, and Avelox to cover strep plus anerobe coverage. 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  Peripheral edema: Likely secondary to hypoalbuminemia, albumin 1.7 on 8/16  DVT Prophylaxis: Lovenox  Code Status: Full code  Disposition:    Subjective: Patient seen before the CT-guided biopsy, watching TV no complaints except fever at night  Objective: Weight change:   Intake/Output Summary (Last 24 hours) at 04/17/13 1403 Last data filed  at 04/16/13 1854  Gross per 24 hour  Intake    360 ml  Output      0 ml  Net    360 ml   Blood pressure 150/96, pulse 106, temperature 100.2 F (37.9 C), temperature source Oral, resp. rate 25, height 5\' 2"  (1.575 m), weight  92.534 kg (204 lb), last menstrual period 03/09/2013, SpO2 100.00%.  Physical Exam: General: A x O x3 CVS: S1-S2 clear Chest: coarse BS in left upper lung  Abdomen: soft NT, NBS  extremities: no cyanosis, clubbing. 1 + edema    Lab Results: Basic Metabolic Panel:  Recent Labs Lab 04/16/13 0500 04/17/13 0520  NA 134* 135  K 3.5 3.6  CL 102 102  CO2 19 21  GLUCOSE 100* 82  BUN 12 9  CREATININE 1.40* 1.30*  CALCIUM 8.7 8.5   Liver Function Tests:  Recent Labs Lab 04/11/13 0500  AST 72*  ALT 35  ALKPHOS 97  BILITOT 0.3  PROT 6.8  ALBUMIN 1.7*   No results found for this basename: LIPASE, AMYLASE,  in the last 168 hours No results found for this basename: AMMONIA,  in the last 168 hours CBC:  Recent Labs Lab 04/16/13 0500 04/17/13 0520  WBC 19.7* 15.0*  HGB 8.4* 8.5*  HCT 24.0* 24.3*  MCV 80.8 81.8  PLT 428* 455*   Cardiac Enzymes: No results found for this basename: CKTOTAL, CKMB, CKMBINDEX, TROPONINI,  in the last 168 hours BNP: No components found with this basename: POCBNP,  CBG: No results found for this basename: GLUCAP,  in the last 168 hours   Micro Results: Recent Results (from the past 240 hour(s))  CULTURE, BLOOD (ROUTINE X 2)     Status: None   Collection Time    04/09/13  8:55 AM      Result Value Range Status   Specimen Description BLOOD RIGHT ANTECUBITAL   Final   Special Requests BOTTLES DRAWN AEROBIC ONLY   Final   Culture  Setup Time     Final   Value: 04/09/2013 17:08     Performed at [facility]   Culture     Final   Value: NO GROWTH 5 DAYS     Performed at [facility]   Report Status 04/15/2013 FINAL   Final  CULTURE, BLOOD (ROUTINE X 2)     Status: None   Collection Time    04/09/13  9:02 AM      Result Value Range Status   Specimen Description BLOOD ARM RIGHT   Final   Special Requests BOTTLES DRAWN AEROBIC ONLY   Final   Culture  Setup Time     Final   Value: 04/09/2013 17:07      Performed at [facility]   Culture     Final   Value: NO GROWTH 5 DAYS     Performed at [facility]   Report Status 04/15/2013 FINAL   Final  MRSA PCR SCREENING     Status: None   Collection Time    04/09/13  3:05 PM      Result Value Range Status   MRSA by PCR NEGATIVE  NEGATIVE Final   Comment:            The GeneXpert MRSA Assay (FDA     approved for NASAL specimens     only), is one component of a     comprehensive MRSA colonization     surveillance program.  It is not     intended to diagnose MRSA     infection nor to guide or     monitor treatment for     MRSA infections.  CULTURE, EXPECTORATED SPUTUM-ASSESSMENT     Status: None   Collection Time    04/09/13  5:18 PM      Result Value Range Status   Specimen Description SPUTUM   Final   Special Requests Normal   Final   Sputum evaluation     Final   Value: MICROSCOPIC FINDINGS SUGGEST THAT THIS SPECIMEN IS NOT REPRESENTATIVE OF LOWER RESPIRATORY SECRETIONS. PLEASE RECOLLECT.     CALLED TO RN S.MERLINI AT 0053 04/10/13 BY L.PITT   Report Status 04/10/2013 FINAL   Final  CULTURE, EXPECTORATED SPUTUM-ASSESSMENT     Status: None   Collection Time    04/10/13  3:04 AM      Result Value Range Status   Specimen Description SPUTUM   Final   Special Requests NONE   Final   Sputum evaluation     Final   Value: MICROSCOPIC FINDINGS SUGGEST THAT THIS SPECIMEN IS NOT REPRESENTATIVE OF LOWER RESPIRATORY SECRETIONS. PLEASE RECOLLECT.     CALLED TO MERLINI,S RN 04/10/2013 0336 JORDANS   Report Status 04/10/2013 FINAL   Final  AFB CULTURE WITH SMEAR     Status: None   Collection Time    04/10/13 11:00 AM      Result Value Range Status   Specimen Description SPUTUM   Final   Special Requests NONE   Final   ACID FAST SMEAR     Final   Value: NO ACID FAST BACILLI SEEN     Performed at [facility]   Culture     Final   Value: CULTURE WILL BE EXAMINED FOR 6 WEEKS BEFORE ISSUING A FINAL REPORT     Performed  at [facility]   Report Status PENDING   Incomplete  AFB CULTURE WITH SMEAR     Status: None   Collection Time    04/11/13 11:42 AM      Result Value Range Status   Specimen Description SPUTUM   Final   Special Requests Immunocompromised   Final   ACID FAST SMEAR     Final   Value: NO ACID FAST BACILLI SEEN     Performed at [facility]   Culture     Final   Value: CULTURE WILL BE EXAMINED FOR 6 WEEKS BEFORE ISSUING A FINAL REPORT     Performed at [facility]   Report Status PENDING   Incomplete  AFB CULTURE WITH SMEAR     Status: None   Collection Time    04/12/13  3:04 PM      Result Value Range Status   Specimen Description NASOPHARYNGEAL   Final   Special Requests Immunocompromised   Final   ACID FAST SMEAR     Final   Value: NO ACID FAST BACILLI SEEN     Performed at [facility]   Culture     Final   Value: CULTURE WILL BE EXAMINED FOR 6 WEEKS BEFORE ISSUING A FINAL REPORT     Performed at [facility]   Report Status PENDING   Incomplete  AFB CULTURE WITH SMEAR     Status: None   Collection Time    04/13/13  5:40 PM      Result Value Range Status   Specimen Description BRONCHIAL ALVEOLAR LAVAGE  Final   Special Requests Immunocompromised   Final   ACID FAST SMEAR     Final   Value: NO ACID FAST BACILLI SEEN     Performed at [facility]   Culture     Final   Value: CULTURE WILL BE EXAMINED FOR 6 WEEKS BEFORE ISSUING A FINAL REPORT     Performed at [facility]   Report Status PENDING   Incomplete  FUNGUS CULTURE W SMEAR     Status: None   Collection Time    04/13/13  5:40 PM      Result Value Range Status   Specimen Description BRONCHIAL ALVEOLAR LAVAGE   Final   Special Requests Immunocompromised   Final   Fungal Smear     Final   Value: RARE YEAST     Performed at [facility]   Culture     Final   Value: CANDIDA ALBICANS     Performed at [facility]   Report Status PENDING    Incomplete  LEGIONELLA CULTURE     Status: None   Collection Time    04/13/13  5:40 PM      Result Value Range Status   Specimen Description BRONCHIAL ALVEOLAR LAVAGE   Final   Special Requests Immunocompromised   Final   Culture     Final   Value: NO LEGIONELLA ISOLATED, CULTURE IN PROGRESS FOR 5 DAYS     Performed at [facility]   Report Status PENDING   Incomplete  PNEUMOCYSTIS JIROVECI SMEAR BY DFA     Status: None   Collection Time    04/13/13  5:40 PM      Result Value Range Status   Specimen Source-PJSRC BRONCHIAL ALVEOLAR LAVAGE   Final   Pneumocystis jiroveci Ag NEGATIVE   Final   Comment: Performed at [facility]  RESPIRATORY VIRUS PANEL     Status: None   Collection Time    04/13/13  5:40 PM      Result Value Range Status   Source - RVPAN BRONCHIAL ALVEOLAR LAVAGE   Corrected   Comment: CORRECTED ON 08/19 AT 1841: PREVIOUSLY REPORTED AS BRONCHIAL ALVEOLAR LAVAGE   Respiratory Syncytial Virus A NOT DETECTED   Final   Respiratory Syncytial Virus B NOT DETECTED   Final   Influenza A NOT DETECTED   Final   Influenza B NOT DETECTED   Final   Parainfluenza 1 NOT DETECTED   Final   Parainfluenza 2 NOT DETECTED   Final   Parainfluenza 3 NOT DETECTED   Final   Metapneumovirus NOT DETECTED   Final   Rhinovirus NOT DETECTED   Final   Adenovirus NOT DETECTED   Final   Influenza A H1 NOT DETECTED   Final   Influenza A H3 NOT DETECTED   Final   Comment: (NOTE)           Normal Reference Range for each Analyte: NOT DETECTED     Testing performed using the Luminex xTAG Respiratory Viral Panel test     kit.     This test was developed and its performance characteristics determined     by [facility]. It has not been cleared or approved by the [place]     Food and Drug Administration. This test is used for clinical purposes.     It should not be regarded as investigational or for research. This     laboratory is certified  under the Clinical Laboratory  Improvement     Amendments of 1988 (CLIA) as qualified to perform high complexity     clinical laboratory testing.     Performed at [facility]  CULTURE, BAL-QUANTITATIVE     Status: None   Collection Time    04/13/13  5:40 PM      Result Value Range Status   Specimen Description BRONCHIAL ALVEOLAR LAVAGE   Final   Special Requests NONE   Final   Gram Stain     Final   Value: RARE WBC PRESENT, PREDOMINANTLY PMN     NO SQUAMOUS EPITHELIAL CELLS SEEN     NO ORGANISMS SEEN     Performed at [facility] Count     Final   Value: 50,000 COLONIES/ML     Performed at [facility]   Culture     Final   Value: CANDIDA ALBICANS     Performed at [facility]   Report Status 04/16/2013 FINAL   Final  CLOSTRIDIUM DIFFICILE BY PCR     Status: None   Collection Time    04/14/13  2:26 PM      Result Value Range Status   C difficile by pcr NEGATIVE  NEGATIVE Final    Studies/Results: Ct Angio Chest Pe W/cm &/or Wo Cm  04/09/2013   *RADIOLOGY REPORT*  Clinical Data: Left upper chest pain and shortness of breath. Nonproductive cough and fever.  CT ANGIOGRAPHY CHEST  Technique:  Multidetector CT imaging of the chest using the standard protocol during bolus administration of intravenous contrast. Multiplanar reconstructed images including MIPs were obtained and reviewed to evaluate the vascular anatomy.  Contrast: OMNIPAQUE IOHEXOL 350 MG/ML SOLN  Comparison: Chest radiograph 04/09/2013.  Findings: No pulmonary embolus.  Mediastinal lymph nodes measure up to 9 mm in the low right paratracheal station.  Axillary lymph nodes measure up to 13 mm on the left.  Heart is at the upper limits of normal in size to mildly enlarged.  No pericardial effusion.  Trace left pleural fluid.  Large area of airspace consolidation and surrounding inflammatory change in the left upper lobe with internal low attenuation and air.  Airway remains grossly patent. Minimal scattered  peribronchovascular nodularity in the right lung.  Incidental imaging of the upper abdomen shows no acute findings. No worrisome lytic or sclerotic lesions.  IMPRESSION:  1.  Negative for pulmonary embolus. 2.  Consolidative findings in the left upper lobe are most consistent with pneumonia and associated central abscess. Follow-up to clearing is recommended as malignancy cannot be definitively excluded.  These results were called by telephone on 04/09/2013 at 1200 hours to Dr. Rubin Payor, who verbally acknowledged these results. 3.  Mediastinal and axillary lymph nodes may be reactive. 4.  Trace left pleural fluid.   Original Report Authenticated By: Leanna Battles, M.D.   Dg Chest Portable 1 View  04/09/2013   *RADIOLOGY REPORT*  Clinical Data: Chest pain  PORTABLE CHEST - 1 VIEW  Comparison: July 03, 2007  Findings:  There is a large area of consolidation with questionable mass in the left upper lobe.  This area of opacity measures 10.1 x 6.3 cm.  Right lung is clear.  Heart is mildly enlarged with normal pulmonary vascularity.  No adenopathy.  IMPRESSION: Consolidation with questionable mass left upper lobe.  Advise correlation with contrast enhanced chest CT to further evaluate. Right lung clear.  Mild cardiomegaly.   Original Report Authenticated By: Chrissie Noa  Margarita Grizzle, M.D.    Medications: Scheduled Meds: . amLODipine  10 mg Oral Daily  . benzonatate  100 mg Oral TID  . dextromethorphan  30 mg Oral BID  . ethambutol  15 mg/kg Oral Daily  . isoniazid  300 mg Oral Daily  . moxifloxacin  400 mg Oral Daily  . pyrazinamide  2,000 mg Oral Daily  . vitamin B-6  50 mg Oral Daily  . rifabutin  300 mg Oral Daily  . sodium chloride  3 mL Intravenous Q12H  . sulfamethoxazole-trimethoprim  1 tablet Oral Daily      LOS: 8 days   Kiernan Farkas M.D. [facility] 04/17/2013, 2:03 PM Pager: [phone]  If 7PM-7AM, please contact night-coverage [email or web address] Password [email or web address]

## 2013-04-17 NOTE — Procedures (Signed)
CT L lung core biopsy 18g x4, to surg path and micro No complication No blood loss. See complete dictation in [facility].

## 2013-04-17 NOTE — Progress Notes (Signed)
[facility]    Subjective: Patient still febrile over past 24 hrs (101.8) and had continued chills and sweats last night. Underwent CT guided biopsy of left lung mass today with no immediate complications. She is in no acute distress.    Antibiotics:  Anti-infectives   Start     Dose/Rate Route Frequency Ordered Stop   04/15/13 1600  rifabutin (MYCOBUTIN) capsule 300 mg     300 mg Oral Daily 04/15/13 1454     04/15/13 1000  ethambutol (MYAMBUTOL) tablet 1,400 mg     15 mg/kg  92.5 kg Oral Daily 04/15/13 0801     04/15/13 1000  rifampin (RIFADIN) capsule 600 mg  Status:  Discontinued     600 mg Oral Daily 04/15/13 0801 04/15/13 1454   04/15/13 1000  pyrazinamide tablet 2,000 mg     2,000 mg Oral Daily 04/15/13 0801     04/15/13 1000  isoniazid (NYDRAZID) tablet 300 mg     300 mg Oral Daily 04/15/13 0801     04/15/13 1000  moxifloxacin (AVELOX) tablet 400 mg     400 mg Oral Daily 04/15/13 0801     04/13/13 1845  sulfamethoxazole-trimethoprim (BACTRIM DS) 800-160 MG per tablet 1 tablet     1 tablet Oral Daily 04/13/13 1813     04/11/13 2000  Ampicillin-Sulbactam (UNASYN) 3 g in sodium chloride 0.9 % 100 mL IVPB  Status:  Discontinued     3 g 100 mL/hr over 60 Minutes Intravenous Every 6 hours 04/11/13 1439 04/15/13 1007   04/11/13 1500  vancomycin (VANCOCIN) IVPB 1000 mg/200 mL premix  Status:  Discontinued     1,000 mg 200 mL/hr over 60 Minutes Intravenous Every 12 hours 04/11/13 1441 04/13/13 0935   04/10/13 1400  vancomycin (VANCOCIN) IVPB 1000 mg/200 mL premix  Status:  Discontinued     1,000 mg 200 mL/hr over 60 Minutes Intravenous Every 24 hours 04/09/13 1323 04/11/13 1441   04/09/13 2000  Ampicillin-Sulbactam (UNASYN) 3 g in sodium chloride 0.9 % 100 mL IVPB  Status:  Discontinued     3 g 100 mL/hr over 60 Minutes Intravenous Every 8 hours 04/09/13 1328 04/11/13 1439   04/09/13 1330  vancomycin (VANCOCIN) 1,250 mg in sodium chloride 0.9 % 250  mL IVPB     1,250 mg 166.7 mL/hr over 90 Minutes Intravenous STAT 04/09/13 1322 04/09/13 1538   04/09/13 1315  clindamycin (CLEOCIN) IVPB 600 mg     600 mg 100 mL/hr over 30 Minutes Intravenous  Once 04/09/13 1302 04/09/13 1359   04/09/13 0900  cefTRIAXone (ROCEPHIN) 1 g in dextrose 5 % 50 mL IVPB     1 g 100 mL/hr over 30 Minutes Intravenous  Once 04/09/13 0850 04/09/13 1042   04/09/13 0900  azithromycin (ZITHROMAX) 500 mg in dextrose 5 % 250 mL IVPB     500 mg 250 mL/hr over 60 Minutes Intravenous  Once 04/09/13 0850 04/09/13 1227      Medications: Scheduled Meds: . amLODipine  10 mg Oral Daily  . benzonatate  100 mg Oral TID  . dextromethorphan  30 mg Oral BID  . ethambutol  15 mg/kg Oral Daily  . isoniazid  300 mg Oral Daily  . moxifloxacin  400 mg Oral Daily  . pyrazinamide  2,000 mg Oral Daily  . vitamin B-6  50 mg Oral Daily  . rifabutin  300 mg Oral Daily  . sodium chloride  3 mL Intravenous Q12H  .  sulfamethoxazole-trimethoprim  1 tablet Oral Daily   Continuous Infusions:  PRN Meds:.acetaminophen, acetaminophen, chlorpheniramine-HYDROcodone, HYDROcodone-acetaminophen, oxyCODONE   Objective: Weight change:   Intake/Output Summary (Last 24 hours) at 04/17/13 1352 Last data filed at 04/16/13 1854  Gross per 24 hour  Intake    360 ml  Output      0 ml  Net    360 ml   Blood pressure 150/96, pulse 106, temperature 100.2 F (37.9 C), temperature source Oral, resp. rate 25, height 5\' 2"  (1.575 m), weight 92.534 kg (204 lb), last menstrual period 03/09/2013, SpO2 100.00%. Temp:  [99.3 F (37.4 C)-101.8 F (38.8 C)] 100.2 F (37.9 C) (08/22 0609) Pulse Rate:  [100-130] 106 (08/22 0609) Resp:  [18-26] 25 (08/22 1030) BP: (125-168)/(76-105) 150/96 mmHg (08/22 1030) SpO2:  [96 %-100 %] 100 % (08/22 1030)  Physical Exam: General: Alert and awake, oriented x3, not in any acute distress. HEENT: anicteric sclera, pupils reactive to light and accommodation, EOMI, no  oral thrush CVS regular rate, normal r,  no murmur rubs or gallops Chest: improving but decreased breath sounds LUL, coarse breath sounds throughout remaining left lung, R CTA Abdomen: soft, nondistended, normal bowel sounds, very mild TTP left flank and significant improvement Extremities: 2+ pitting edema B/L LE Skin: no rashes Lymph: no new lymphadenopathy Neuro: nonfocal  Lab Results:  Recent Labs  04/16/13 0500 04/17/13 0520  WBC 19.7* 15.0*  HGB 8.4* 8.5*  HCT 24.0* 24.3*  PLT 428* 455*    BMET  Recent Labs  04/16/13 0500 04/17/13 0520  NA 134* 135  K 3.5 3.6  CL 102 102  CO2 19 21  GLUCOSE 100* 82  BUN 12 9  CREATININE 1.40* 1.30*  CALCIUM 8.7 8.5    Micro Results: Recent Results (from the past 240 hour(s))  CULTURE, BLOOD (ROUTINE X 2)     Status: None   Collection Time    04/09/13  8:55 AM      Result Value Range Status   Specimen Description BLOOD RIGHT ANTECUBITAL   Final   Special Requests BOTTLES DRAWN AEROBIC ONLY   Final   Culture  Setup Time     Final   Value: 04/09/2013 17:08     Performed at [facility]   Culture     Final   Value: NO GROWTH 5 DAYS     Performed at [facility]   Report Status 04/15/2013 FINAL   Final  CULTURE, BLOOD (ROUTINE X 2)     Status: None   Collection Time    04/09/13  9:02 AM      Result Value Range Status   Specimen Description BLOOD ARM RIGHT   Final   Special Requests BOTTLES DRAWN AEROBIC ONLY   Final   Culture  Setup Time     Final   Value: 04/09/2013 17:07     Performed at [facility]   Culture     Final   Value: NO GROWTH 5 DAYS     Performed at [facility]   Report Status 04/15/2013 FINAL   Final  MRSA PCR SCREENING     Status: None   Collection Time    04/09/13  3:05 PM      Result Value Range Status   MRSA by PCR NEGATIVE  NEGATIVE Final   Comment:            The GeneXpert MRSA Assay (FDA     approved for NASAL specimens  only), is one  component of a     comprehensive MRSA colonization     surveillance program. It is not     intended to diagnose MRSA     infection nor to guide or     monitor treatment for     MRSA infections.  CULTURE, EXPECTORATED SPUTUM-ASSESSMENT     Status: None   Collection Time    04/09/13  5:18 PM      Result Value Range Status   Specimen Description SPUTUM   Final   Special Requests Normal   Final   Sputum evaluation     Final   Value: MICROSCOPIC FINDINGS SUGGEST THAT THIS SPECIMEN IS NOT REPRESENTATIVE OF LOWER RESPIRATORY SECRETIONS. PLEASE RECOLLECT.     CALLED TO RN S.MERLINI AT 0053 04/10/13 BY L.PITT   Report Status 04/10/2013 FINAL   Final  CULTURE, EXPECTORATED SPUTUM-ASSESSMENT     Status: None   Collection Time    04/10/13  3:04 AM      Result Value Range Status   Specimen Description SPUTUM   Final   Special Requests NONE   Final   Sputum evaluation     Final   Value: MICROSCOPIC FINDINGS SUGGEST THAT THIS SPECIMEN IS NOT REPRESENTATIVE OF LOWER RESPIRATORY SECRETIONS. PLEASE RECOLLECT.     CALLED TO MERLINI,S RN 04/10/2013 0336 JORDANS   Report Status 04/10/2013 FINAL   Final  AFB CULTURE WITH SMEAR     Status: None   Collection Time    04/10/13 11:00 AM      Result Value Range Status   Specimen Description SPUTUM   Final   Special Requests NONE   Final   ACID FAST SMEAR     Final   Value: NO ACID FAST BACILLI SEEN     Performed at [facility]   Culture     Final   Value: CULTURE WILL BE EXAMINED FOR 6 WEEKS BEFORE ISSUING A FINAL REPORT     Performed at [facility]   Report Status PENDING   Incomplete  AFB CULTURE WITH SMEAR     Status: None   Collection Time    04/11/13 11:42 AM      Result Value Range Status   Specimen Description SPUTUM   Final   Special Requests Immunocompromised   Final   ACID FAST SMEAR     Final   Value: NO ACID FAST BACILLI SEEN     Performed at [facility]   Culture     Final   Value: CULTURE WILL BE  EXAMINED FOR 6 WEEKS BEFORE ISSUING A FINAL REPORT     Performed at [facility]   Report Status PENDING   Incomplete  AFB CULTURE WITH SMEAR     Status: None   Collection Time    04/12/13  3:04 PM      Result Value Range Status   Specimen Description NASOPHARYNGEAL   Final   Special Requests Immunocompromised   Final   ACID FAST SMEAR     Final   Value: NO ACID FAST BACILLI SEEN     Performed at [facility]   Culture     Final   Value: CULTURE WILL BE EXAMINED FOR 6 WEEKS BEFORE ISSUING A FINAL REPORT     Performed at [facility]   Report Status PENDING   Incomplete  AFB CULTURE WITH SMEAR     Status: None   Collection Time  04/13/13  5:40 PM      Result Value Range Status   Specimen Description BRONCHIAL ALVEOLAR LAVAGE   Final   Special Requests Immunocompromised   Final   ACID FAST SMEAR     Final   Value: NO ACID FAST BACILLI SEEN     Performed at [facility]   Culture     Final   Value: CULTURE WILL BE EXAMINED FOR 6 WEEKS BEFORE ISSUING A FINAL REPORT     Performed at [facility]   Report Status PENDING   Incomplete  FUNGUS CULTURE W SMEAR     Status: None   Collection Time    04/13/13  5:40 PM      Result Value Range Status   Specimen Description BRONCHIAL ALVEOLAR LAVAGE   Final   Special Requests Immunocompromised   Final   Fungal Smear     Final   Value: RARE YEAST     Performed at [facility]   Culture     Final   Value: CANDIDA ALBICANS     Performed at [facility]   Report Status PENDING   Incomplete  LEGIONELLA CULTURE     Status: None   Collection Time    04/13/13  5:40 PM      Result Value Range Status   Specimen Description BRONCHIAL ALVEOLAR LAVAGE   Final   Special Requests Immunocompromised   Final   Culture     Final   Value: NO LEGIONELLA ISOLATED, CULTURE IN PROGRESS FOR 5 DAYS     Performed at [facility]   Report Status PENDING   Incomplete  PNEUMOCYSTIS JIROVECI  SMEAR BY DFA     Status: None   Collection Time    04/13/13  5:40 PM      Result Value Range Status   Specimen Source-[facility] BRONCHIAL ALVEOLAR LAVAGE   Final   Pneumocystis jiroveci Ag NEGATIVE   Final   Comment: Performed at [facility]  RESPIRATORY VIRUS PANEL     Status: None   Collection Time    04/13/13  5:40 PM      Result Value Range Status   Source - [facility] BRONCHIAL ALVEOLAR LAVAGE   Corrected   Comment: CORRECTED ON 08/19 AT 1841: PREVIOUSLY REPORTED AS BRONCHIAL ALVEOLAR LAVAGE   Respiratory Syncytial Virus A NOT DETECTED   Final   Respiratory Syncytial Virus B NOT DETECTED   Final   Influenza A NOT DETECTED   Final   Influenza B NOT DETECTED   Final   Parainfluenza 1 NOT DETECTED   Final   Parainfluenza 2 NOT DETECTED   Final   Parainfluenza 3 NOT DETECTED   Final   Metapneumovirus NOT DETECTED   Final   Rhinovirus NOT DETECTED   Final   Adenovirus NOT DETECTED   Final   Influenza A H1 NOT DETECTED   Final   Influenza A H3 NOT DETECTED   Final   Comment: (NOTE)           Normal Reference Range for each Analyte: NOT DETECTED     Testing performed using the Lum[employer] xTAG Respiratory Viral Panel test     kit.     This test was developed and its performance characteristics determined     by [facility]. It has not been cleared or approved by the [place]     Food and Drug Administration. This test is used for clinical purposes.  It should not be regarded as investigational or for research. This     laboratory is certified under the Clinical Laboratory Improvement     Amendments of 1988 (CLIA) as qualified to perform high complexity     clinical laboratory testing.     Performed at [facility]  CULTURE, BAL-QUANTITATIVE     Status: None   Collection Time    04/13/13  5:40 PM      Result Value Range Status   Specimen Description BRONCHIAL ALVEOLAR LAVAGE   Final   Special Requests NONE   Final   Gram Stain     Final   Value: RARE WBC  PRESENT, PREDOMINANTLY PMN     NO SQUAMOUS EPITHELIAL CELLS SEEN     NO ORGANISMS SEEN     Performed at [facility] Count     Final   Value: 50,000 COLONIES/ML     Performed at [facility]   Culture     Final   Value: CANDIDA ALBICANS     Performed at [facility]   Report Status 04/16/2013 FINAL   Final  CLOSTRIDIUM DIFFICILE BY PCR     Status: None   Collection Time    04/14/13  2:26 PM      Result Value Range Status   C difficile by pcr NEGATIVE  NEGATIVE Final    Studies/Results: Dg Chest 2 View  04/16/2013   *RADIOLOGY REPORT*  Clinical Data: Reevaluate lung abscess versus TB or PCP.  Shortness of breath with chest pain and fever  CHEST - 2 VIEW  Comparison: 04/09/2013  Findings: Persistent area of consolidation is identified involving the superior segment of the left upper lobe. This appears slightly more extensive than on the prior exam. An interval increase in the degree of central cavitation is noted in comparison with the prior exam.  New associated pleural effusion/thickening is noted at the left costophrenic angle.  Increased density in the left lower lung zone may reflect associated atelectasis or spreading infection. The right lung remains clear.  Bony structures are intact.  IMPRESSION: Some expansion of the left upper lobe mass like area of consolidation with increasing central cavitation.  New increased density at the left base is questionably related to atelectasis or spread of infection with new left basilar pleural reaction/pleural fluid.   Original Report Authenticated By: Rhodia Albright, M.D.   Ct Biopsy  04/17/2013   *RADIOLOGY REPORT*  Clinical data:  Enlarging cavitary left upper lobe lung lesion. Bronchoscopic biopsies unrevealing.  CT-GUIDED LUNG CORE BIOPSY  Technique and findings: The procedure, risks (including but not limited to bleeding, infection, organ damage, pneumothorax, chest tube), benefits, and alternatives were  explained to the patient. Questions regarding the procedure were encouraged and answered. The patient understands and consents to the procedure.Select axial scans through the thorax were obtained.  The process was localized and an appropriate skin entry site determined. Operator donned sterile gloves and mask.   Site was marked, prepped with Betadine, draped in usual sterile fashion, infiltrated locally with 1% lidocaine.  Intravenous Fentanyl and Versed were administered as conscious sedation during continuous cardiorespiratory monitoring by the radiology RN, with a total moderate sedation time of 20 minutes.  Under CT fluoroscopic guidance, a 17 gauge trocar needle was advanced to the peripheral margin of the lesion.  Once needle tip position was confirmed, coaxial 18-gauge core biopsy samples were obtained, submitted in formalin and saline to  surgical pathology and microbiology respectively  for the requested laboratory studies. The guide needle was removed.  Post   scans show no significant hemorrhage or pneumothorax. The patient tolerated the procedure well.  No immediate complication.  IMPRESSION: 1.  Technically successful CT-guided core biopsy of left upper lobe cavitary lung mass.   Original Report Authenticated By: D. Andria Rhein, MD   Dg Chest Port 1 View  04/17/2013   *RADIOLOGY REPORT*  Clinical Data: Status post left lung biopsy  PORTABLE CHEST - 1 VIEW  Comparison: 04/16/2013  Findings: Cardiac shadow is stable.  An apical cavitary lesion is again identified within the left lung. A tiny pneumothorax is noted in the apex.  No other focal abnormality is seen.  No bony abnormality is noted.  IMPRESSION: Tiny left pneumothorax.  The remainder of the exam is stable.   Original Report Authenticated By: Alcide Clever, M.D.      Assessment/Plan: Monique [name] is a 42 y.o. female with HTN who presented with fevers/chills, left sided substernal chest pain TTP. Chest CT consistent with pneumonia and  LUL abscess with need for f/u to confirm clearance and exclude any malignancy. HIV positive and she has been placed on airborne precautions for TB despite negative AFB sputum.   She underwent CT guided biopsy of her left lung today. Sent for path and culture  Pneumonia, with abscess: Biopsied today --continue avelox for strep + anerobe coverage  --f/u blood cx, NGTD  --BAL with C. albicans however no significance and no oral thrush seen on exam  --Respiratory virus panel, negative  --f/u surgical path and micro specimens from bx  HIV: Confirmatory testing with Western Blot, POSITIVE. Wishes for NO INFORMATION TO BE DISCUSSED WITH FAMILY PRESENT  --CD4 = 70  --VL = 73,711  --Crypto Ag, neg  --HepC Ab, neg  -- Acute Hep panel, Negative  --C. Diff: negative  --HLA [id number], pending  --HIV genotype pending  --PCP smear, neg --Legionella smear, NTD  --Fungus culture w/ smear: C. albicans  --With treatment of pulmonary TB will need to wait 2 weeks (04/29/13 minimum) prior to initiation of HAART to decrease risk of IRIS  --continue Bactrim for PCP ppx   Rule out TB:  --Quantiferon Gold POSITIVE  --AFB sputum x 3, negative  --BAL: NGTD  --continue airborne precautions for now despite AFB neg x 3, given her HIV status and low CD4 (70), positive quantiferon, and high clinical suspicion decision to treat for active pulmonary TB is made.  --continue rifabutin, INH, B6, pyrazinamide, ethambutol    LOS: 8 days   Lewie Chamber 04/17/2013, 1:52 PM

## 2013-04-17 NOTE — Progress Notes (Signed)
INFECTIOUS DISEASE ATTENDING ADDENDUM:     [facility]   Date: 04/17/2013  Patient name: Monique [name]  Medical record number: [id number]  Date of birth: [birth year]    This patient has been seen and discussed with the house staff. Please see their note for complete details. I concur with their findings with the following additions/corrections:  Patient now with pneumothorax but stable. She had o2 [place] at the bedside, not hooked up when I examinedd her.   Placed on 10 L via [place], discussed with Dr. Lodema Hong and Dr. Isidoro Donning   Placed order for FM Venti mask 50% o2 ,asked for repeat CXR review by Dr Isidoro Donning at 1800 and if worse, CCM to intervene  I will broaden her antibiotics by adding zyvox  IF her tissue AFB smear is negative I would dc airborne, but WOULD CONTINUE TO rx for TB AND LUNG abscess  FU cultures, pathology  Pt ultimately may need help of a CT surgeon  Defer ARV 2 weeks while we are rx for TB Bactrim for prophylaxis  Dr. Orvan Falconer is on over the weekend and will be following the pt  [name] 04/17/2013, 6:50 PM

## 2013-04-18 ENCOUNTER — Inpatient Hospital Stay (HOSPITAL_COMMUNITY): Payer: MEDICAID

## 2013-04-18 DIAGNOSIS — A150 Tuberculosis of lung: Secondary | ICD-10-CM

## 2013-04-18 LAB — CBC
HCT: 26.1 % — ABNORMAL LOW (ref 36.0–46.0)
MCH: 29 pg (ref 26.0–34.0)
MCV: 83.1 fL (ref 78.0–100.0)
Platelets: 448 10*3/uL — ABNORMAL HIGH (ref 150–400)
RBC: 3.14 MIL/uL — ABNORMAL LOW (ref 3.87–5.11)
RDW: 13.3 % (ref 11.5–15.5)

## 2013-04-18 LAB — BASIC METABOLIC PANEL
CO2: 21 mEq/L (ref 19–32)
Calcium: 8.7 mg/dL (ref 8.4–10.5)
Chloride: 101 mEq/L (ref 96–112)
Creatinine, Ser: 1.11 mg/dL — ABNORMAL HIGH (ref 0.50–1.10)
Glucose, Bld: 100 mg/dL — ABNORMAL HIGH (ref 70–99)

## 2013-04-18 NOTE — Progress Notes (Signed)
Patient ID: Monique [name], female   DOB: 1970/12/01, 42 y.o.   MRN: [id number]         [facility]    Date of Admission:  04/09/2013   Total days of antibiotics 10        Day 4 empiric TB therapy        Day 4 levofloxacin        Day 2 linezolid  Principal Problem:   Pneumonia with lung abscess Active Problems:   Sepsis   Hypokalemia   Acute kidney injury   Human immunodeficiency virus (HIV) disease   Acute respiratory failure with hypoxia   Dehydration with hyponatremia   . amLODipine  10 mg Oral Daily  . benzonatate  100 mg Oral TID  . dextromethorphan  30 mg Oral BID  . ethambutol  15 mg/kg Oral Daily  . isoniazid  300 mg Oral Daily  . linezolid  600 mg Oral Q12H  . moxifloxacin  400 mg Oral Daily  . pyrazinamide  2,000 mg Oral Daily  . vitamin B-6  50 mg Oral Daily  . rifabutin  300 mg Oral Daily  . sodium chloride  3 mL Intravenous Q12H  . sulfamethoxazole-trimethoprim  1 tablet Oral Daily    Subjective: She is feeling better. She no longer has left-sided chest pain. She still has a dry cough that is unchanged. She has some mild shortness of breath when coughing that is unchanged she is aware of her persistent fevers. Review of Systems: Pertinent items are noted in HPI.  Past Medical History  Diagnosis Date  . Tonsillitis   . Hypertension     History  Substance Use Topics  . Smoking status: Never Smoker   . Smokeless tobacco: Not on file  . Alcohol Use: No    No family history on file.  No Known Allergies  Objective: Temp:  [100.8 F (38.2 C)-102 F (38.9 C)] 100.8 F (38.2 C) (08/23 0615) Pulse Rate:  [106-115] 106 (08/23 0615) Resp:  [20] 20 (08/23 0615) BP: (139-144)/(83-94) 144/94 mmHg (08/23 0615) SpO2:  [100 %] 100 % (08/23 0615) FiO2 (%):  [50 %] 50 % (08/22 1500)  General: She is alert and in no distress Skin: No rash Lungs: Clear Cor: Regular S1 and S2 no murmurs Abdomen: Obese, soft and  nontender Mood and affect: Normal  Lab Results Lab Results  Component Value Date   WBC 15.0* 04/17/2013   HGB 8.5* 04/17/2013   HCT 24.3* 04/17/2013   MCV 81.8 04/17/2013   PLT 455* 04/17/2013    Lab Results  Component Value Date   CREATININE 1.30* 04/17/2013   BUN 9 04/17/2013   NA 135 04/17/2013   K 3.6 04/17/2013   CL 102 04/17/2013   CO2 21 04/17/2013    Lab Results  Component Value Date   ALT 35 04/11/2013   AST 72* 04/11/2013   ALKPHOS 97 04/11/2013   BILITOT 0.3 04/11/2013    HIV 1 RNA Quant (copies/mL)  Date Value  04/10/2013 73711*  CD4: 70   Microbiology: Recent Results (from the past 240 hour(s))  CULTURE, BLOOD (ROUTINE X 2)     Status: None   Collection Time    04/09/13  8:55 AM      Result Value Range Status   Specimen Description BLOOD RIGHT ANTECUBITAL   Final   Special Requests BOTTLES DRAWN AEROBIC ONLY   Final   Culture  Setup Time     Final  Value: 04/09/2013 17:08     Performed at [facility]   Culture     Final   Value: NO GROWTH 5 DAYS     Performed at [facility]   Report Status 04/15/2013 FINAL   Final  CULTURE, BLOOD (ROUTINE X 2)     Status: None   Collection Time    04/09/13  9:02 AM      Result Value Range Status   Specimen Description BLOOD ARM RIGHT   Final   Special Requests BOTTLES DRAWN AEROBIC ONLY   Final   Culture  Setup Time     Final   Value: 04/09/2013 17:07     Performed at [facility]   Culture     Final   Value: NO GROWTH 5 DAYS     Performed at [facility]   Report Status 04/15/2013 FINAL   Final  MRSA PCR SCREENING     Status: None   Collection Time    04/09/13  3:05 PM      Result Value Range Status   MRSA by PCR NEGATIVE  NEGATIVE Final   Comment:            The GeneXpert MRSA Assay (FDA     approved for NASAL specimens     only), is one component of a     comprehensive MRSA colonization     surveillance program. It is not     intended to diagnose MRSA     infection  nor to guide or     monitor treatment for     MRSA infections.  CULTURE, EXPECTORATED SPUTUM-ASSESSMENT     Status: None   Collection Time    04/09/13  5:18 PM      Result Value Range Status   Specimen Description SPUTUM   Final   Special Requests Normal   Final   Sputum evaluation     Final   Value: MICROSCOPIC FINDINGS SUGGEST THAT THIS SPECIMEN IS NOT REPRESENTATIVE OF LOWER RESPIRATORY SECRETIONS. PLEASE RECOLLECT.     CALLED TO RN S.MERLINI AT 0053 04/10/13 BY L.PITT   Report Status 04/10/2013 FINAL   Final  CULTURE, EXPECTORATED SPUTUM-ASSESSMENT     Status: None   Collection Time    04/10/13  3:04 AM      Result Value Range Status   Specimen Description SPUTUM   Final   Special Requests NONE   Final   Sputum evaluation     Final   Value: MICROSCOPIC FINDINGS SUGGEST THAT THIS SPECIMEN IS NOT REPRESENTATIVE OF LOWER RESPIRATORY SECRETIONS. PLEASE RECOLLECT.     CALLED TO MERLINI,S RN 04/10/2013 0336 JORDANS   Report Status 04/10/2013 FINAL   Final  AFB CULTURE WITH SMEAR     Status: None   Collection Time    04/10/13 11:00 AM      Result Value Range Status   Specimen Description SPUTUM   Final   Special Requests NONE   Final   ACID FAST SMEAR     Final   Value: NO ACID FAST BACILLI SEEN     Performed at [facility]   Culture     Final   Value: CULTURE WILL BE EXAMINED FOR 6 WEEKS BEFORE ISSUING A FINAL REPORT     Performed at [facility]   Report Status PENDING   Incomplete  AFB CULTURE WITH SMEAR     Status: None   Collection Time    04/11/13  11:42 AM      Result Value Range Status   Specimen Description SPUTUM   Final   Special Requests Immunocompromised   Final   ACID FAST SMEAR     Final   Value: NO ACID FAST BACILLI SEEN     Performed at [facility]   Culture     Final   Value: CULTURE WILL BE EXAMINED FOR 6 WEEKS BEFORE ISSUING A FINAL REPORT     Performed at [facility]   Report Status PENDING   Incomplete  AFB CULTURE  WITH SMEAR     Status: None   Collection Time    04/12/13  3:04 PM      Result Value Range Status   Specimen Description NASOPHARYNGEAL   Final   Special Requests Immunocompromised   Final   ACID FAST SMEAR     Final   Value: NO ACID FAST BACILLI SEEN     Performed at [facility]   Culture     Final   Value: CULTURE WILL BE EXAMINED FOR 6 WEEKS BEFORE ISSUING A FINAL REPORT     Performed at [facility]   Report Status PENDING   Incomplete  AFB CULTURE WITH SMEAR     Status: None   Collection Time    04/13/13  5:40 PM      Result Value Range Status   Specimen Description BRONCHIAL ALVEOLAR LAVAGE   Final   Special Requests Immunocompromised   Final   ACID FAST SMEAR     Final   Value: NO ACID FAST BACILLI SEEN     Performed at [facility]   Culture     Final   Value: CULTURE WILL BE EXAMINED FOR 6 WEEKS BEFORE ISSUING A FINAL REPORT     Performed at [facility]   Report Status PENDING   Incomplete  FUNGUS CULTURE W SMEAR     Status: None   Collection Time    04/13/13  5:40 PM      Result Value Range Status   Specimen Description BRONCHIAL ALVEOLAR LAVAGE   Final   Special Requests Immunocompromised   Final   Fungal Smear     Final   Value: RARE YEAST     Performed at [facility]   Culture     Final   Value: CANDIDA ALBICANS     Performed at [facility]   Report Status PENDING   Incomplete  LEGIONELLA CULTURE     Status: None   Collection Time    04/13/13  5:40 PM      Result Value Range Status   Specimen Description BRONCHIAL ALVEOLAR LAVAGE   Final   Special Requests Immunocompromised   Final   Culture     Final   Value: NO LEGIONELLA ISOLATED, CULTURE IN PROGRESS FOR 5 DAYS     Performed at [facility]   Report Status PENDING   Incomplete  PNEUMOCYSTIS JIROVECI SMEAR BY DFA     Status: None   Collection Time    04/13/13  5:40 PM      Result Value Range Status   Specimen Source-[facility] BRONCHIAL ALVEOLAR  LAVAGE   Final   Pneumocystis jiroveci Ag NEGATIVE   Final   Comment: Performed at [facility]  RESPIRATORY VIRUS PANEL     Status: None   Collection Time    04/13/13  5:40 PM  Result Value Range Status   Source - [facility] BRONCHIAL ALVEOLAR LAVAGE   Corrected   Comment: CORRECTED ON 08/19 AT 1841: PREVIOUSLY REPORTED AS BRONCHIAL ALVEOLAR LAVAGE   Respiratory Syncytial Virus A NOT DETECTED   Final   Respiratory Syncytial Virus B NOT DETECTED   Final   Influenza A NOT DETECTED   Final   Influenza B NOT DETECTED   Final   Parainfluenza 1 NOT DETECTED   Final   Parainfluenza 2 NOT DETECTED   Final   Parainfluenza 3 NOT DETECTED   Final   Metapneumovirus NOT DETECTED   Final   Rhinovirus NOT DETECTED   Final   Adenovirus NOT DETECTED   Final   Influenza A H1 NOT DETECTED   Final   Influenza A H3 NOT DETECTED   Final   Comment: (NOTE)           Normal Reference Range for each Analyte: NOT DETECTED     Testing performed using the Lumine[employer] xTAG Respiratory Viral Panel test     kit.     This test was developed and its performance characteristics determined     by [facility]. It has not been cleared or approved by the [place]     Food and Drug Administration. This test is used for clinical purposes.     It should not be regarded as investigational or for research. This     laboratory is certified under the Clinical Laboratory Improvement     Amendments of 1988 (CLIA) as qualified to perform high complexity     clinical laboratory testing.     Performed at [facility]  CULTURE, BAL-QUANTITATIVE     Status: None   Collection Time    04/13/13  5:40 PM      Result Value Range Status   Specimen Description BRONCHIAL ALVEOLAR LAVAGE   Final   Special Requests NONE   Final   Gram Stain     Final   Value: RARE WBC PRESENT, PREDOMINANTLY PMN     NO SQUAMOUS EPITHELIAL CELLS SEEN     NO ORGANISMS SEEN     Performed at [facility] Count      Final   Value: 50,000 COLONIES/ML     Performed at [facility]   Culture     Final   Value: CANDIDA ALBICANS     Performed at [facility]   Report Status 04/16/2013 FINAL   Final  CLOSTRIDIUM DIFFICILE BY PCR     Status: None   Collection Time    04/14/13  2:26 PM      Result Value Range Status   C difficile by pcr NEGATIVE  NEGATIVE Final  FUNGUS CULTURE W SMEAR     Status: None   Collection Time    04/17/13 10:35 AM      Result Value Range Status   Specimen Description TISSUE LUNG LEFT   Final   Special Requests CT CORE LEFT LUNG ABSCESS   Final   Fungal Smear     Final   Value: NO YEAST OR FUNGAL ELEMENTS SEEN     Performed at [facility]   Culture     Final   Value: CULTURE IN PROGRESS FOR FOUR WEEKS     Performed at [facility]   Report Status PENDING   Incomplete  TISSUE CULTURE     Status: None   Collection Time    04/17/13 10:35  AM      Result Value Range Status   Specimen Description TISSUE LUNG LEFT   Final   Special Requests CT CORE LEFT LUNG ABCESS   Final   Gram Stain     Final   Value: NO WBC SEEN     NO ORGANISMS SEEN     Performed at [facility]   Culture     Final   Value: NO GROWTH 1 DAY     Performed at [facility]   Report Status PENDING   Incomplete    Studies/Results: Dg Chest 2 View  04/18/2013   *RADIOLOGY REPORT*  Clinical Data: Tiny left pneumothorax, follow-up.  Left lung infiltrate, follow up.  CHEST - 2 VIEW  Comparison: 04/17/2013  Findings: Extensive consolidation throughout most of the left lung is unchanged.  The tiny apical pneumothorax seen on the previous day's study is not appreciated on the current exam.  The right lung remains clear.  Cardiac silhouette is normal in size. No mediastinal or masses are noted.  IMPRESSION: The tiny left apical pneumothorax seen on the previous day's study is not appreciated on the current exam.  Persistent extensive left lung consolidation is stable.    Original Report Authenticated By: Amie Portland, M.D.   Dg Chest 2 View  04/17/2013   *RADIOLOGY REPORT*  Clinical Data: Follow up pneumothorax  CHEST - 2 VIEW  Comparison: 04/17/2013 at 1445 hours  Findings: Enlargement of cardiac silhouette. Mediastinal contours and pulmonary vascularity normal. Extensive infiltrate persists throughout left lung with a persistent area of cavitation and abscess formation in the left upper lobe. Persistent small left apex pneumothorax, minimally increased since previous exam. Medial pneumothorax along the left mediastinal border is slightly smaller. Minimal right basilar atelectasis. Remaining lungs clear. Small left pleural effusion is present. No new osseous abnormalities.  IMPRESSION: Left hydropneumothorax with minimal increase in size of the pneumothorax component at the left apex and slight decrease in the pneumothorax component along the mediastinal border. Extensive infiltrate throughout left lung with cavitation / abscess formation in the left upper lobe.   Original Report Authenticated By: Ulyses Southward, M.D.   Dg Chest 2 View  04/16/2013   *RADIOLOGY REPORT*  Clinical Data: Reevaluate lung abscess versus TB or PCP.  Shortness of breath with chest pain and fever  CHEST - 2 VIEW  Comparison: 04/09/2013  Findings: Persistent area of consolidation is identified involving the superior segment of the left upper lobe. This appears slightly more extensive than on the prior exam. An interval increase in the degree of central cavitation is noted in comparison with the prior exam.  New associated pleural effusion/thickening is noted at the left costophrenic angle.  Increased density in the left lower lung zone may reflect associated atelectasis or spreading infection. The right lung remains clear.  Bony structures are intact.  IMPRESSION: Some expansion of the left upper lobe mass like area of consolidation with increasing central cavitation.  New increased density at the left  base is questionably related to atelectasis or spread of infection with new left basilar pleural reaction/pleural fluid.   Original Report Authenticated By: Rhodia Albright, M.D.   Ct Biopsy  04/17/2013   *RADIOLOGY REPORT*  Clinical data:  Enlarging cavitary left upper lobe lung lesion. Bronchoscopic biopsies unrevealing.  CT-GUIDED LUNG CORE BIOPSY  Technique and findings: The procedure, risks (including but not limited to bleeding, infection, organ damage, pneumothorax, chest tube), benefits, and alternatives were explained to the patient. Questions regarding the procedure  were encouraged and answered. The patient understands and consents to the procedure.Select axial scans through the thorax were obtained.  The process was localized and an appropriate skin entry site determined. Operator donned sterile gloves and mask.   Site was marked, prepped with Betadine, draped in usual sterile fashion, infiltrated locally with 1% lidocaine.  Intravenous Fentanyl and Versed were administered as conscious sedation during continuous cardiorespiratory monitoring by the radiology RN, with a total moderate sedation time of 20 minutes.  Under CT fluoroscopic guidance, a 17 gauge trocar needle was advanced to the peripheral margin of the lesion.  Once needle tip position was confirmed, coaxial 18-gauge core biopsy samples were obtained, submitted in formalin and saline to  surgical pathology and microbiology respectively for the requested laboratory studies. The guide needle was removed.  Post   scans show no significant hemorrhage or pneumothorax. The patient tolerated the procedure well.  No immediate complication.  IMPRESSION: 1.  Technically successful CT-guided core biopsy of left upper lobe cavitary lung mass.   Original Report Authenticated By: D. Andria Rhein, MD   Dg Chest 1v Repeat Same Day  04/17/2013   *RADIOLOGY REPORT*  Clinical Data: Post left lung biopsy.  Rule out pneumothorax.  CHEST - 1 VIEW SAME DAY   Comparison: CT chest from today.  Chest x-ray from today.  Findings: Normal tiny left apical pneumothorax is unchanged.  Large cavitary pneumonia left upper lobe again noted.  The patient has developed a small left effusion with which appears larger compared with earlier today.  Continued follow-up is suggested.  The right lung is clear.  IMPRESSION: Tiny left apical pneumothorax is unchanged.  Large cavitary pneumonia left upper lobe is unchanged.  Small left effusion appears larger compared with earlier today.   Original Report Authenticated By: Janeece Riggers, M.D.   Dg Chest Port 1 View  04/17/2013   *RADIOLOGY REPORT*  Clinical Data: Status post left lung biopsy  PORTABLE CHEST - 1 VIEW  Comparison: 04/16/2013  Findings: Cardiac shadow is stable.  An apical cavitary lesion is again identified within the left lung. A tiny pneumothorax is noted in the apex.  No other focal abnormality is seen.  No bony abnormality is noted.  IMPRESSION: Tiny left pneumothorax.  The remainder of the exam is stable.   Original Report Authenticated By: Alcide Clever, M.D.    Assessment: She's had some symptomatic improvement on broad empiric antibiotic therapy for a large left upper lobe lung abscess. On repeat CT yesterday for her biopsy the abscess cavity had enlarged since admission. Lung biopsy specimen analyses are all pending. I will continue airborne precautions and current antibiotic therapy for now.  Plan: 1. Continue current broad empiric antibiotic therapy and airborne precautions pending lung biopsy results  Cliffton Asters, MD [facility] [phone] pager   [phone] cell 04/18/2013, 1:36 PM

## 2013-04-18 NOTE — Progress Notes (Signed)
[facility] PROGRESS NOTE  ELBONY MCCLIMANS XBJ:[id number] DOB: 20-Jan-1971 DOA: 04/09/2013 PCP: Default, Provider, MD   Brief narrative: 42 year old female patient with history of hypertension and tobacco abuse presented to the emergency department with subjective fevers and chills for 3 days and nonproductive cough. This was  associated with anorexia for one week. The evening prior to presentation she had been cleaning her house she developed sharp pain in her left chest that was quite severe and was associated with shortness of breath. In the emergency department the patient was tachycardic with a heart rate of 120 and respiratory rate of 25. she had a fever of 102.8 and her O2 sats are 90% on room air. She had  leukocytosis with a white count of 13,700 hemoglobin was 10.4 sodium 125 potassium 2.6 bicarbonate 18 creatinine 1.65. Portable chest x-ray was concerning for a mass in the left upper lobe. CT angiogram of the chest revealed consolidation in the left upper lobe consistent with pneumonia and a possible central pulmonary abscess. No evidence of pulmonary embolus. Because of her symptoms and vital signs she was given 3 L of normal saline and started on IV Rocephin and azithromycin followed by a dose of IV clindamycin and was admitted to the step down unit. Infectious disease and pulmonology was consulted.    Assessment/Plan:  Sepsis  Secondary to left  lung abscess and pneumonia  - Chest x-ray done on 8/21 showed expansion of the left upper lobe masslike area of consolidation with increasing central cavitation.  - CT guided biopsy of the left lung mass done on 8/22. Tissue cx so fat negative for growth - Started on RIPE +avelox on 8/20, continue  bactrim, still spiking fevers overnight  - C. difficile negative  -check repeat CBC, BMET  Acute respiratory failure with hypoxia/Pneumonia with left upper lung abscess:  Sats better on 2L via  nasal cannula  -3 AFB's are  negative,Quantiferon Gold positive, concern given new dx HIV, low CD4 70, decision was made by infectious disease to treat for active pulmonary TB.  - Bronched on 08/18, BAL with C. albicans however no significance, no oral thrush seen on exam, benign reactive changes on the bronchial washing. - Cont ethambutol, INH, Pyrazinamide, rifampin, pyridoxine, and Avelox to cover strep plus anerobe coverage.  - will need to wait for 2 weeks prior to initiation of HAART  - continue airborne precautions until bx of lung mass negative. - on Bactrim for PCP prophylaxis  - if patient continues to have fever with persistent left lung consolidation over next 24 hrs, will ask thoracic sx to evaluate for surgical intervention.  Acute kidney injury/ Dehydration with hyponatremia  Improving.   (HIV)  / AIDS Newly diagnosed CD4 of  70, VL of 73,711, pneumocystis negative on BAL  HAART as outpt PT DOES NOT WANT THIS DIAGNOSIS DISCUSSED WITH ANY FRIENDS OR FAMILY.      DVT Prophylaxis: Lovenox  Code Status: Full code  Disposition: pending improvement in sx  Consults:  Pulmonology- Dr. Marchelle Gearing  Infectious disease Dr. Algis Liming  Interventional radiology   Antibiotics  Ethambutol  Isoniazid  Pyrazinamide  Rifampin  pyridoxine  Avelox  Bactrim 8/18 >>       HPI/Subjective: Feels her SOB to be better. Still has fever  Objective: Filed Vitals:   04/18/13 0615  BP: 144/94  Pulse: 106  Temp: 100.8 F (38.2 C)  Resp: 20    Intake/Output Summary (Last 24 hours) at 04/18/13 1241 Last data filed at 04/18/13  0900  Gross per 24 hour  Intake    603 ml  Output      0 ml  Net    603 ml   Filed Weights   04/11/13 0400 04/12/13 0421 04/13/13 1706  Weight: 88.4 kg (194 lb 14.2 oz) 92.7 kg (204 lb 5.9 oz) 92.534 kg (204 lb)    Exam:   General:  Middle aged female in no acute distress  HEENT: No pallor, moist oral mucosa  Chest: Left basal crackles, no added sounds  CVS: Normal S1 and  S2, no murmurs rub or gallop  Abdomen: Soft, nontender, nondistended, bowel sounds present  Extremities: Warm, no edema  CNS: AAO x3  Data Reviewed: Basic Metabolic Panel:  Recent Labs Lab 04/13/13 0525 04/15/13 0500 04/16/13 0500 04/17/13 0520  NA 132* 137 134* 135  K 3.4* 3.8 3.5 3.6  CL 103 107 102 102  CO2 18* 18* 19 21  GLUCOSE 123* 90 100* 82  BUN 12 12 12 9   CREATININE 1.21* 1.24* 1.40* 1.30*  CALCIUM 8.6 8.1* 8.7 8.5   Liver Function Tests: No results found for this basename: AST, ALT, ALKPHOS, BILITOT, PROT, ALBUMIN,  in the last 168 hours No results found for this basename: LIPASE, AMYLASE,  in the last 168 hours No results found for this basename: AMMONIA,  in the last 168 hours CBC:  Recent Labs Lab 04/13/13 0525 04/15/13 0500 04/16/13 0500 04/17/13 0520  WBC 19.7* 23.2* 19.7* 15.0*  HGB 9.2* 8.7* 8.4* 8.5*  HCT 25.7* 23.9* 24.0* 24.3*  MCV 80.3 81.0 80.8 81.8  PLT 279 363 428* 455*   Cardiac Enzymes: No results found for this basename: CKTOTAL, CKMB, CKMBINDEX, TROPONINI,  in the last 168 hours BNP (last 3 results) No results found for this basename: PROBNP,  in the last 8760 hours CBG: No results found for this basename: GLUCAP,  in the last 168 hours  Recent Results (from the past 240 hour(s))  CULTURE, BLOOD (ROUTINE X 2)     Status: None   Collection Time    04/09/13  8:55 AM      Result Value Range Status   Specimen Description BLOOD RIGHT ANTECUBITAL   Final   Special Requests BOTTLES DRAWN AEROBIC ONLY   Final   Culture  Setup Time     Final   Value: 04/09/2013 17:08     Performed at [facility]   Culture     Final   Value: NO GROWTH 5 DAYS     Performed at [facility]   Report Status 04/15/2013 FINAL   Final  CULTURE, BLOOD (ROUTINE X 2)     Status: None   Collection Time    04/09/13  9:02 AM      Result Value Range Status   Specimen Description BLOOD ARM RIGHT   Final   Special Requests BOTTLES DRAWN  AEROBIC ONLY   Final   Culture  Setup Time     Final   Value: 04/09/2013 17:07     Performed at [facility]   Culture     Final   Value: NO GROWTH 5 DAYS     Performed at [facility]   Report Status 04/15/2013 FINAL   Final  MRSA PCR SCREENING     Status: None   Collection Time    04/09/13  3:05 PM      Result Value Range Status   MRSA by PCR NEGATIVE  NEGATIVE Final  Comment:            The GeneXpert MRSA Assay (FDA     approved for NASAL specimens     only), is one component of a     comprehensive MRSA colonization     surveillance program. It is not     intended to diagnose MRSA     infection nor to guide or     monitor treatment for     MRSA infections.  CULTURE, EXPECTORATED SPUTUM-ASSESSMENT     Status: None   Collection Time    04/09/13  5:18 PM      Result Value Range Status   Specimen Description SPUTUM   Final   Special Requests Normal   Final   Sputum evaluation     Final   Value: MICROSCOPIC FINDINGS SUGGEST THAT THIS SPECIMEN IS NOT REPRESENTATIVE OF LOWER RESPIRATORY SECRETIONS. PLEASE RECOLLECT.     CALLED TO RN S.MERLINI AT 0053 04/10/13 BY L.PITT   Report Status 04/10/2013 FINAL   Final  CULTURE, EXPECTORATED SPUTUM-ASSESSMENT     Status: None   Collection Time    04/10/13  3:04 AM      Result Value Range Status   Specimen Description SPUTUM   Final   Special Requests NONE   Final   Sputum evaluation     Final   Value: MICROSCOPIC FINDINGS SUGGEST THAT THIS SPECIMEN IS NOT REPRESENTATIVE OF LOWER RESPIRATORY SECRETIONS. PLEASE RECOLLECT.     CALLED TO MERLINI,S RN 04/10/2013 0336 JORDANS   Report Status 04/10/2013 FINAL   Final  AFB CULTURE WITH SMEAR     Status: None   Collection Time    04/10/13 11:00 AM      Result Value Range Status   Specimen Description SPUTUM   Final   Special Requests NONE   Final   ACID FAST SMEAR     Final   Value: NO ACID FAST BACILLI SEEN     Performed at [facility]   Culture     Final    Value: CULTURE WILL BE EXAMINED FOR 6 WEEKS BEFORE ISSUING A FINAL REPORT     Performed at [facility]   Report Status PENDING   Incomplete  AFB CULTURE WITH SMEAR     Status: None   Collection Time    04/11/13 11:42 AM      Result Value Range Status   Specimen Description SPUTUM   Final   Special Requests Immunocompromised   Final   ACID FAST SMEAR     Final   Value: NO ACID FAST BACILLI SEEN     Performed at [facility]   Culture     Final   Value: CULTURE WILL BE EXAMINED FOR 6 WEEKS BEFORE ISSUING A FINAL REPORT     Performed at [facility]   Report Status PENDING   Incomplete  AFB CULTURE WITH SMEAR     Status: None   Collection Time    04/12/13  3:04 PM      Result Value Range Status   Specimen Description NASOPHARYNGEAL   Final   Special Requests Immunocompromised   Final   ACID FAST SMEAR     Final   Value: NO ACID FAST BACILLI SEEN     Performed at [facility]   Culture     Final   Value: CULTURE WILL BE EXAMINED FOR 6 WEEKS BEFORE ISSUING A FINAL REPORT     Performed at [facility]   Report Status PENDING   Incomplete  AFB CULTURE WITH SMEAR     Status: None   Collection Time    04/13/13  5:40 PM      Result Value Range Status   Specimen Description BRONCHIAL ALVEOLAR LAVAGE   Final   Special Requests Immunocompromised   Final   ACID FAST SMEAR     Final   Value: NO ACID FAST BACILLI SEEN     Performed at [facility]   Culture     Final   Value: CULTURE WILL BE EXAMINED FOR 6 WEEKS BEFORE ISSUING A FINAL REPORT     Performed at [facility]   Report Status PENDING   Incomplete  FUNGUS CULTURE W SMEAR     Status: None   Collection Time    04/13/13  5:40 PM      Result Value Range Status   Specimen Description BRONCHIAL ALVEOLAR LAVAGE   Final   Special Requests Immunocompromised   Final   Fungal Smear     Final   Value: RARE YEAST     Performed at [facility]   Culture     Final    Value: CANDIDA ALBICANS     Performed at [facility]   Report Status PENDING   Incomplete  LEGIONELLA CULTURE     Status: None   Collection Time    04/13/13  5:40 PM      Result Value Range Status   Specimen Description BRONCHIAL ALVEOLAR LAVAGE   Final   Special Requests Immunocompromised   Final   Culture     Final   Value: NO LEGIONELLA ISOLATED, CULTURE IN PROGRESS FOR 5 DAYS     Performed at [facility]   Report Status PENDING   Incomplete  PNEUMOCYSTIS JIROVECI SMEAR BY DFA     Status: None   Collection Time    04/13/13  5:40 PM      Result Value Range Status   Specimen Source-[facility]JSRC BRONCHIAL ALVEOLAR LAVAGE   Final   Pneumocystis jiroveci Ag NEGATIVE   Final   Comment: Performed at [facility]  RESPIRATORY VIRUS PANEL     Status: None   Collection Time    04/13/13  5:40 PM      Result Value Range Status   Source - RVPAN BRONCHIAL ALVEOLAR LAVAGE   Corrected   Comment: CORRECTED ON 08/19 AT 1841: PREVIOUSLY REPORTED AS BRONCHIAL ALVEOLAR LAVAGE   Respiratory Syncytial Virus A NOT DETECTED   Final   Respiratory Syncytial Virus B NOT DETECTED   Final   Influenza A NOT DETECTED   Final   Influenza B NOT DETECTED   Final   Parainfluenza 1 NOT DETECTED   Final   Parainfluenza 2 NOT DETECTED   Final   Parainfluenza 3 NOT DETECTED   Final   Metapneumovirus NOT DETECTED   Final   Rhinovirus NOT DETECTED   Final   Adenovirus NOT DETECTED   Final   Influenza A H1 NOT DETECTED   Final   Influenza A H3 NOT DETECTED   Final   Comment: (NOTE)           Normal Reference Range for each Analyte: NOT DETECTED     Testing performed using the Luminex xTAG Respiratory Viral Panel test     kit.     This test was developed and its performance characteristics determined     by  [facility]. It has not been cleared or approved by the [place]     Food and Drug Administration. This test is used for clinical purposes.     It should not be regarded as  investigational or for research. This     laboratory is certified under the Clinical Laboratory Improvement     Amendments of 1988 (CLIA) as qualified to perform high complexity     clinical laboratory testing.     Performed at [facility]  CULTURE, BAL-QUANTITATIVE     Status: None   Collection Time    04/13/13  5:40 PM      Result Value Range Status   Specimen Description BRONCHIAL ALVEOLAR LAVAGE   Final   Special Requests NONE   Final   Gram Stain     Final   Value: RARE WBC PRESENT, PREDOMINANTLY PMN     NO SQUAMOUS EPITHELIAL CELLS SEEN     NO ORGANISMS SEEN     Performed at [facility] Count     Final   Value: 50,000 COLONIES/ML     Performed at [facility]   Culture     Final   Value: CANDIDA ALBICANS     Performed at [facility]   Report Status 04/16/2013 FINAL   Final  CLOSTRIDIUM DIFFICILE BY PCR     Status: None   Collection Time    04/14/13  2:26 PM      Result Value Range Status   C difficile by pcr NEGATIVE  NEGATIVE Final  FUNGUS CULTURE W SMEAR     Status: None   Collection Time    04/17/13 10:35 AM      Result Value Range Status   Specimen Description TISSUE LUNG LEFT   Final   Special Requests CT CORE LEFT LUNG ABSCESS   Final   Fungal Smear     Final   Value: NO YEAST OR FUNGAL ELEMENTS SEEN     Performed at [facility]   Culture     Final   Value: CULTURE IN PROGRESS FOR FOUR WEEKS     Performed at [facility]   Report Status PENDING   Incomplete  TISSUE CULTURE     Status: None   Collection Time    04/17/13 10:35 AM      Result Value Range Status   Specimen Description TISSUE LUNG LEFT   Final   Special Requests CT CORE LEFT LUNG ABCESS   Final   Gram Stain     Final   Value: NO WBC SEEN     NO ORGANISMS SEEN     Performed at [facility]   Culture     Final   Value: NO GROWTH 1 DAY     Performed at [facility]   Report Status PENDING   Incomplete      Studies: Dg Chest 2 View  04/18/2013   *RADIOLOGY REPORT*  Clinical Data: Tiny left pneumothorax, follow-up.  Left lung infiltrate, follow up.  CHEST - 2 VIEW  Comparison: 04/17/2013  Findings: Extensive consolidation throughout most of the left lung is unchanged.  The tiny apical pneumothorax seen on the previous day's study is not appreciated on the current exam.  The right lung remains clear.  Cardiac silhouette is normal in size. No mediastinal or masses are noted.  IMPRESSION: The tiny left apical pneumothorax seen on the previous day's study is not appreciated on the  current exam.  Persistent extensive left lung consolidation is stable.   Original Report Authenticated By: Amie Portland, M.D.   Dg Chest 2 View  04/17/2013   *RADIOLOGY REPORT*  Clinical Data: Follow up pneumothorax  CHEST - 2 VIEW  Comparison: 04/17/2013 at 1445 hours  Findings: Enlargement of cardiac silhouette. Mediastinal contours and pulmonary vascularity normal. Extensive infiltrate persists throughout left lung with a persistent area of cavitation and abscess formation in the left upper lobe. Persistent small left apex pneumothorax, minimally increased since previous exam. Medial pneumothorax along the left mediastinal border is slightly smaller. Minimal right basilar atelectasis. Remaining lungs clear. Small left pleural effusion is present. No new osseous abnormalities.  IMPRESSION: Left hydropneumothorax with minimal increase in size of the pneumothorax component at the left apex and slight decrease in the pneumothorax component along the mediastinal border. Extensive infiltrate throughout left lung with cavitation / abscess formation in the left upper lobe.   Original Report Authenticated By: Ulyses Southward, M.D.   Dg Chest 2 View  04/16/2013   *RADIOLOGY REPORT*  Clinical Data: Reevaluate lung abscess versus TB or PCP.  Shortness of breath with chest pain and fever  CHEST - 2 VIEW  Comparison: 04/09/2013  Findings: Persistent  area of consolidation is identified involving the superior segment of the left upper lobe. This appears slightly more extensive than on the prior exam. An interval increase in the degree of central cavitation is noted in comparison with the prior exam.  New associated pleural effusion/thickening is noted at the left costophrenic angle.  Increased density in the left lower lung zone may reflect associated atelectasis or spreading infection. The right lung remains clear.  Bony structures are intact.  IMPRESSION: Some expansion of the left upper lobe mass like area of consolidation with increasing central cavitation.  New increased density at the left base is questionably related to atelectasis or spread of infection with new left basilar pleural reaction/pleural fluid.   Original Report Authenticated By: Rhodia Albright, M.D.   Ct Biopsy  04/17/2013   *RADIOLOGY REPORT*  Clinical data:  Enlarging cavitary left upper lobe lung lesion. Bronchoscopic biopsies unrevealing.  CT-GUIDED LUNG CORE BIOPSY  Technique and findings: The procedure, risks (including but not limited to bleeding, infection, organ damage, pneumothorax, chest tube), benefits, and alternatives were explained to the patient. Questions regarding the procedure were encouraged and answered. The patient understands and consents to the procedure.Select axial scans through the thorax were obtained.  The process was localized and an appropriate skin entry site determined. Operator donned sterile gloves and mask.   Site was marked, prepped with Betadine, draped in usual sterile fashion, infiltrated locally with 1% lidocaine.  Intravenous Fentanyl and Versed were administered as conscious sedation during continuous cardiorespiratory monitoring by the radiology RN, with a total moderate sedation time of 20 minutes.  Under CT fluoroscopic guidance, a 17 gauge trocar needle was advanced to the peripheral margin of the lesion.  Once needle tip position was  confirmed, coaxial 18-gauge core biopsy samples were obtained, submitted in formalin and saline to  surgical pathology and microbiology respectively for the requested laboratory studies. The guide needle was removed.  Post   scans show no significant hemorrhage or pneumothorax. The patient tolerated the procedure well.  No immediate complication.  IMPRESSION: 1.  Technically successful CT-guided core biopsy of left upper lobe cavitary lung mass.   Original Report Authenticated By: D. Andria Rhein, MD   Dg Chest 1v Repeat Same Day  04/17/2013   *RADIOLOGY  REPORT*  Clinical Data: Post left lung biopsy.  Rule out pneumothorax.  CHEST - 1 VIEW SAME DAY  Comparison: CT chest from today.  Chest x-ray from today.  Findings: Normal tiny left apical pneumothorax is unchanged.  Large cavitary pneumonia left upper lobe again noted.  The patient has developed a small left effusion with which appears larger compared with earlier today.  Continued follow-up is suggested.  The right lung is clear.  IMPRESSION: Tiny left apical pneumothorax is unchanged.  Large cavitary pneumonia left upper lobe is unchanged.  Small left effusion appears larger compared with earlier today.   Original Report Authenticated By: Janeece Riggers, M.D.   Dg Chest Port 1 View  04/17/2013   *RADIOLOGY REPORT*  Clinical Data: Status post left lung biopsy  PORTABLE CHEST - 1 VIEW  Comparison: 04/16/2013  Findings: Cardiac shadow is stable.  An apical cavitary lesion is again identified within the left lung. A tiny pneumothorax is noted in the apex.  No other focal abnormality is seen.  No bony abnormality is noted.  IMPRESSION: Tiny left pneumothorax.  The remainder of the exam is stable.   Original Report Authenticated By: Alcide Clever, M.D.    Scheduled Meds: . amLODipine  10 mg Oral Daily  . benzonatate  100 mg Oral TID  . dextromethorphan  30 mg Oral BID  . ethambutol  15 mg/kg Oral Daily  . isoniazid  300 mg Oral Daily  . linezolid  600 mg Oral  Q12H  . moxifloxacin  400 mg Oral Daily  . pyrazinamide  2,000 mg Oral Daily  . vitamin B-6  50 mg Oral Daily  . rifabutin  300 mg Oral Daily  . sodium chloride  3 mL Intravenous Q12H  . sulfamethoxazole-trimethoprim  1 tablet Oral Daily   Continuous Infusions:     Time spent: 25 minutes    Alani Lacivita  [facility] Pager [phone] If 7PM-7AM, please contact night-coverage at [email or web address], password [facility] 04/18/2013, 12:41 PM  LOS: 9 days

## 2013-04-19 LAB — LEGIONELLA CULTURE

## 2013-04-19 NOTE — Progress Notes (Signed)
[facility] PROGRESS NOTE  Monique [name] NWG:[id number] DOB: July 03, 1971 DOA: 04/09/2013 PCP: Default, Provider, MD  Brief narrative:  42 year old female patient with history of hypertension and tobacco abuse presented to the emergency department with subjective fevers and chills for 3 days and nonproductive cough. This was associated with anorexia for one week. The evening prior to presentation she had been cleaning her house she developed sharp pain in her left chest that was quite severe and was associated with shortness of breath. In the emergency department the patient was tachycardic with a heart rate of 120 and respiratory rate of 25. she had a fever of 102.8 and her O2 sats are 90% on room air. She had leukocytosis with a white count of 13,700 hemoglobin was 10.4 sodium 125 potassium 2.6 bicarbonate 18 creatinine 1.65. Portable chest x-ray was concerning for a mass in the left upper lobe. CT angiogram of the chest revealed consolidation in the left upper lobe consistent with pneumonia and a possible central pulmonary abscess. No evidence of pulmonary embolus. Because of her symptoms and vital signs she was given 3 L of normal saline and started on IV Rocephin and azithromycin followed by a dose of IV clindamycin and was admitted to the step down unit. Infectious disease and pulmonology was consulted.   Assessment/Plan:  Sepsis  Secondary to left lung abscess and pneumonia  - Chest x-ray done on 8/21 showed expansion of the left upper lobe masslike area of consolidation with increasing central cavitation.  - CT guided biopsy of the left lung mass done on 8/22. Tissue cx so fat negative for growth  - Started on RIPE +avelox on 8/20, continue bactrim, still spiking fevers overnight  - C. difficile negative  - CBC, BMET improved.  Acute respiratory failure with hypoxia/Pneumonia with left upper lung abscess:  Sats better on 2L via nasal cannula  -3 AFB's are negative,Quantiferon Gold  positive, concern given new dx HIV, low CD4 70, decision was made by infectious disease to treat for active pulmonary TB.  - Bronched on 08/18, BAL with C. albicans however no significance, no oral thrush seen on exam, benign reactive changes on the bronchial washing.  - Cont ethambutol, INH, Pyrazinamide, rifampin, pyridoxine, and Avelox to cover strep plus anerobe coverage.  - will need to wait for 2 weeks prior to initiation of HAART  - bx of lung mass shows no growth on day 2. Continue airborne isolation until evaluated by ID.  -fever spikes trending down. Will monitor for now. - on Bactrim for PCP prophylaxis .  Acute kidney injury/ Dehydration with hyponatremia  Improving.    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   DVT Prophylaxis: Lovenox  Code Status: Full code   Disposition: home pending improvement in sx   Consults:  Pulmonology-  Infectious disease Dr. Algis Liming / [facility]   Antibiotics  Ethambutol  Isoniazid  Pyrazinamide  Rifampin  pyridoxine  Avelox  Bactrim 8/18 >>    HPI/Subjective:  Feels her SOB to be better everyday. Afebrile this morning. MAXIMUM TEMPERATURE of 101.5Fpast  24 hours.   Objective: Filed Vitals:   04/19/13 0628  BP: 119/70  Pulse: 89  Temp: 98.4 F (36.9 C)  Resp: 20    Intake/Output Summary (Last 24 hours) at 04/19/13 1047 Last data filed at 04/18/13 1900  Gross  per 24 hour  Intake    720 ml  Output      0 ml  Net    720 ml   Filed Weights   04/11/13 0400 04/12/13 0421 04/13/13 1706  Weight: 88.4 kg (194 lb 14.2 oz) 92.7 kg (204 lb 5.9 oz) 92.534 kg (204 lb)    Exam: General: Middle aged female in no acute distress  HEENT: No pallor, moist oral mucosa  Chest: Left basal crackles, no added sounds  CVS: Normal S1 and S2, no murmurs rub or gallop  Abdomen: Soft,  nontender, nondistended, bowel sounds present  Extremities: Warm, no edema  CNS: AAO x3   Data Reviewed: Basic Metabolic Panel:  Recent Labs Lab 04/13/13 0525 04/15/13 0500 04/16/13 0500 04/17/13 0520 04/18/13 1515  NA 132* 137 134* 135 132*  K 3.4* 3.8 3.5 3.6 4.1  CL 103 107 102 102 101  CO2 18* 18* 19 21 21   GLUCOSE 123* 90 100* 82 100*  BUN 12 12 12 9 8   CREATININE 1.21* 1.24* 1.40* 1.30* 1.11*  CALCIUM 8.6 8.1* 8.7 8.5 8.7   Liver Function Tests: No results found for this basename: AST, ALT, ALKPHOS, BILITOT, PROT, ALBUMIN,  in the last 168 hours No results found for this basename: LIPASE, AMYLASE,  in the last 168 hours No results found for this basename: AMMONIA,  in the last 168 hours CBC:  Recent Labs Lab 04/13/13 0525 04/15/13 0500 04/16/13 0500 04/17/13 0520 04/18/13 1515  WBC 19.7* 23.2* 19.7* 15.0* 13.0*  HGB 9.2* 8.7* 8.4* 8.5* 9.1*  HCT 25.7* 23.9* 24.0* 24.3* 26.1*  MCV 80.3 81.0 80.8 81.8 83.1  PLT 279 363 428* 455* 448*   Cardiac Enzymes: No results found for this basename: CKTOTAL, CKMB, CKMBINDEX, TROPONINI,  in the last 168 hours BNP (last 3 results) No results found for this basename: PROBNP,  in the last 8760 hours CBG: No results found for this basename: GLUCAP,  in the last 168 hours  Recent Results (from the past 240 hour(s))  MRSA PCR SCREENING     Status: None   Collection Time    04/09/13  3:05 PM      Result Value Range Status   MRSA by PCR NEGATIVE  NEGATIVE Final   Comment:            The GeneXpert MRSA Assay (FDA     approved for NASAL specimens     only), is one component of a     comprehensive MRSA colonization     surveillance program. It is not     intended to diagnose MRSA     infection nor to guide or     monitor treatment for     MRSA infections.  CULTURE, EXPECTORATED SPUTUM-ASSESSMENT     Status: None   Collection Time    04/09/13  5:18 PM      Result Value Range Status   Specimen Description SPUTUM    Final   Special Requests Normal   Final   Sputum evaluation     Final   Value: MICROSCOPIC FINDINGS SUGGEST THAT THIS SPECIMEN IS NOT REPRESENTATIVE OF LOWER RESPIRATORY SECRETIONS. PLEASE RECOLLECT.     CALLED TO RN S.MERLINI AT 0053 04/10/13 BY L.PITT   Report Status 04/10/2013 FINAL   Final  CULTURE, EXPECTORATED SPUTUM-ASSESSMENT     Status: None   Collection Time    04/10/13  3:04 AM      Result Value Range Status   Specimen Description  SPUTUM   Final   Special Requests NONE   Final   Sputum evaluation     Final   Value: MICROSCOPIC FINDINGS SUGGEST THAT THIS SPECIMEN IS NOT REPRESENTATIVE OF LOWER RESPIRATORY SECRETIONS. PLEASE RECOLLECT.     CALLED TO MERLINI,S RN 04/10/2013 0336 JORDANS   Report Status 04/10/2013 FINAL   Final  AFB CULTURE WITH SMEAR     Status: None   Collection Time    04/10/13 11:00 AM      Result Value Range Status   Specimen Description SPUTUM   Final   Special Requests NONE   Final   ACID FAST SMEAR     Final   Value: NO ACID FAST BACILLI SEEN     Performed at [facility]   Culture     Final   Value: CULTURE WILL BE EXAMINED FOR 6 WEEKS BEFORE ISSUING A FINAL REPORT     Performed at [facility]   Report Status PENDING   Incomplete  AFB CULTURE WITH SMEAR     Status: None   Collection Time    04/11/13 11:42 AM      Result Value Range Status   Specimen Description SPUTUM   Final   Special Requests Immunocompromised   Final   ACID FAST SMEAR     Final   Value: NO ACID FAST BACILLI SEEN     Performed at [facility]   Culture     Final   Value: CULTURE WILL BE EXAMINED FOR 6 WEEKS BEFORE ISSUING A FINAL REPORT     Performed at [facility]   Report Status PENDING   Incomplete  AFB CULTURE WITH SMEAR     Status: None   Collection Time    04/12/13  3:04 PM      Result Value Range Status   Specimen Description NASOPHARYNGEAL   Final   Special Requests Immunocompromised   Final   ACID FAST SMEAR     Final    Value: NO ACID FAST BACILLI SEEN     Performed at [facility]   Culture     Final   Value: CULTURE WILL BE EXAMINED FOR 6 WEEKS BEFORE ISSUING A FINAL REPORT     Performed at [facility]   Report Status PENDING   Incomplete  AFB CULTURE WITH SMEAR     Status: None   Collection Time    04/13/13  5:40 PM      Result Value Range Status   Specimen Description BRONCHIAL ALVEOLAR LAVAGE   Final   Special Requests Immunocompromised   Final   ACID FAST SMEAR     Final   Value: NO ACID FAST BACILLI SEEN     Performed at [facility]   Culture     Final   Value: CULTURE WILL BE EXAMINED FOR 6 WEEKS BEFORE ISSUING A FINAL REPORT     Performed at [facility]   Report Status PENDING   Incomplete  FUNGUS CULTURE W SMEAR     Status: None   Collection Time    04/13/13  5:40 PM      Result Value Range Status   Specimen Description BRONCHIAL ALVEOLAR LAVAGE   Final   Special Requests Immunocompromised   Final   Fungal Smear     Final   Value: RARE YEAST     Performed at [facility]   Culture     Final   Value: CANDIDA  ALBICANS     Performed at [facility]   Report Status PENDING   Incomplete  LEGIONELLA CULTURE     Status: None   Collection Time    04/13/13  5:40 PM      Result Value Range Status   Specimen Description BRONCHIAL ALVEOLAR LAVAGE   Final   Special Requests Immunocompromised   Final   Culture     Final   Value: NO LEGIONELLA ISOLATED     Performed at [facility]   Report Status 04/19/2013 FINAL   Final  PNEUMOCYSTIS JIROVECI SMEAR BY DFA     Status: None   Collection Time    04/13/13  5:40 PM      Result Value Range Status   Specimen Source-PJSRC BRONCHIAL ALVEOLAR LAVAGE   Final   Pneumocystis jiroveci Ag NEGATIVE   Final   Comment: Performed at [facility]  RESPIRATORY VIRUS PANEL     Status: None   Collection Time    04/13/13  5:40 PM      Result Value Range Status   Source - RVPAN  BRONCHIAL ALVEOLAR LAVAGE   Corrected   Comment: CORRECTED ON 08/19 AT 1841: PREVIOUSLY REPORTED AS BRONCHIAL ALVEOLAR LAVAGE   Respiratory Syncytial Virus A NOT DETECTED   Final   Respiratory Syncytial Virus B NOT DETECTED   Final   Influenza A NOT DETECTED   Final   Influenza B NOT DETECTED   Final   Parainfluenza 1 NOT DETECTED   Final   Parainfluenza 2 NOT DETECTED   Final   Parainfluenza 3 NOT DETECTED   Final   Metapneumovirus NOT DETECTED   Final   Rhinovirus NOT DETECTED   Final   Adenovirus NOT DETECTED   Final   Influenza A H1 NOT DETECTED   Final   Influenza A H3 NOT DETECTED   Final   Comment: (NOTE)           Normal Reference Range for each Analyte: NOT DETECTED     Testing performed using the Luminex xTAG Respiratory Viral Panel test     kit.     This test was developed and its performance characteristics determined     by [facility]. It has not been cleared or approved by the [place]     Food and Drug Administration. This test is used for clinical purposes.     It should not be regarded as investigational or for research. This     laboratory is certified under the Clinical Laboratory Improvement     Amendments of 1988 (CLIA) as qualified to perform high complexity     clinical laboratory testing.     Performed at [facility]  M. TUBERCULOSIS COMPLEX BY PCR     Status: None   Collection Time    04/13/13  5:40 PM      Result Value Range Status   M. tuberculosis, Direct Not detected  Not detected Final   Comment: (NOTE)     This test(s) was developed and its performance characteristics      have been determined by [facility] [facility],      [place], [place]. Performance characteristics refer to the analytical      performance of the test.     Performed at [facility]   Source (MTBPCR) BRONCHIAL ALVEOLAR LAVAGE   Final  CULTURE, BAL-QUANTITATIVE     Status: None   Collection Time    04/13/13  5:40 PM      Result Value Range  Status   Specimen Description BRONCHIAL ALVEOLAR LAVAGE   Final   Special Requests NONE   Final   Gram Stain     Final   Value: RARE WBC PRESENT, PREDOMINANTLY PMN     NO SQUAMOUS EPITHELIAL CELLS SEEN     NO ORGANISMS SEEN     Performed at [facility] Count     Final   Value: 50,000 COLONIES/ML     Performed at [facility]   Culture     Final   Value: CANDIDA ALBICANS     Performed at [facility]   Report Status 04/16/2013 FINAL   Final  CLOSTRIDIUM DIFFICILE BY PCR     Status: None   Collection Time    04/14/13  2:26 PM      Result Value Range Status   C difficile by pcr NEGATIVE  NEGATIVE Final  FUNGUS CULTURE W SMEAR     Status: None   Collection Time    04/17/13 10:35 AM      Result Value Range Status   Specimen Description TISSUE LUNG LEFT   Final   Special Requests CT CORE LEFT LUNG ABSCESS   Final   Fungal Smear     Final   Value: NO YEAST OR FUNGAL ELEMENTS SEEN     Performed at [facility]   Culture     Final   Value: CULTURE IN PROGRESS FOR FOUR WEEKS     Performed at [facility]   Report Status PENDING   Incomplete  TISSUE CULTURE     Status: None   Collection Time    04/17/13 10:35 AM      Result Value Range Status   Specimen Description TISSUE LUNG LEFT   Final   Special Requests CT CORE LEFT LUNG ABCESS   Final   Gram Stain     Final   Value: NO WBC SEEN     NO ORGANISMS SEEN     Performed at [facility]   Culture     Final   Value: NO GROWTH 2 DAYS     Performed at [facility]   Report Status PENDING   Incomplete  AFB CULTURE WITH SMEAR     Status: None   Collection Time    04/17/13 10:36 AM      Result Value Range Status   Specimen Description TISSUE LUNG LEFT   Final   Special Requests CT CORE LEFT LUNG ABSCESS   Final   ACID FAST SMEAR     Final   Value: NO ACID FAST BACILLI SEEN     Performed at [facility]   Culture     Final   Value: CULTURE WILL BE EXAMINED  FOR 6 WEEKS BEFORE ISSUING A FINAL REPORT     Performed at [facility]   Report Status PENDING   Incomplete     Studies: Dg Chest 2 View  04/18/2013   *RADIOLOGY REPORT*  Clinical Data: Tiny left pneumothorax, follow-up.  Left lung infiltrate, follow up.  CHEST - 2 VIEW  Comparison: 04/17/2013  Findings: Extensive consolidation throughout most of the left lung is unchanged.  The tiny apical pneumothorax seen on the previous day's study is not appreciated on the current exam.  The right lung remains clear.  Cardiac silhouette is normal in size. No mediastinal or masses are noted.  IMPRESSION:  The tiny left apical pneumothorax seen on the previous day's study is not appreciated on the current exam.  Persistent extensive left lung consolidation is stable.   Original Report Authenticated By: Amie Portland, M.D.   Dg Chest 2 View  04/17/2013   *RADIOLOGY REPORT*  Clinical Data: Follow up pneumothorax  CHEST - 2 VIEW  Comparison: 04/17/2013 at 1445 hours  Findings: Enlargement of cardiac silhouette. Mediastinal contours and pulmonary vascularity normal. Extensive infiltrate persists throughout left lung with a persistent area of cavitation and abscess formation in the left upper lobe. Persistent small left apex pneumothorax, minimally increased since previous exam. Medial pneumothorax along the left mediastinal border is slightly smaller. Minimal right basilar atelectasis. Remaining lungs clear. Small left pleural effusion is present. No new osseous abnormalities.  IMPRESSION: Left hydropneumothorax with minimal increase in size of the pneumothorax component at the left apex and slight decrease in the pneumothorax component along the mediastinal border. Extensive infiltrate throughout left lung with cavitation / abscess formation in the left upper lobe.   Original Report Authenticated By: Ulyses Southward, M.D.   Ct Biopsy  04/17/2013   *RADIOLOGY REPORT*  Clinical data:  Enlarging cavitary left upper lobe  lung lesion. Bronchoscopic biopsies unrevealing.  CT-GUIDED LUNG CORE BIOPSY  Technique and findings: The procedure, risks (including but not limited to bleeding, infection, organ damage, pneumothorax, chest tube), benefits, and alternatives were explained to the patient. Questions regarding the procedure were encouraged and answered. The patient understands and consents to the procedure.Select axial scans through the thorax were obtained.  The process was localized and an appropriate skin entry site determined. Operator donned sterile gloves and mask.   Site was marked, prepped with Betadine, draped in usual sterile fashion, infiltrated locally with 1% lidocaine.  Intravenous Fentanyl and Versed were administered as conscious sedation during continuous cardiorespiratory monitoring by the radiology RN, with a total moderate sedation time of 20 minutes.  Under CT fluoroscopic guidance, a 17 gauge trocar needle was advanced to the peripheral margin of the lesion.  Once needle tip position was confirmed, coaxial 18-gauge core biopsy samples were obtained, submitted in formalin and saline to  surgical pathology and microbiology respectively for the requested laboratory studies. The guide needle was removed.  Post   scans show no significant hemorrhage or pneumothorax. The patient tolerated the procedure well.  No immediate complication.  IMPRESSION: 1.  Technically successful CT-guided core biopsy of left upper lobe cavitary lung mass.   Original Report Authenticated By: D. Andria Rhein, MD   Dg Chest 1v Repeat Same Day  04/17/2013   *RADIOLOGY REPORT*  Clinical Data: Post left lung biopsy.  Rule out pneumothorax.  CHEST - 1 VIEW SAME DAY  Comparison: CT chest from today.  Chest x-ray from today.  Findings: Normal tiny left apical pneumothorax is unchanged.  Large cavitary pneumonia left upper lobe again noted.  The patient has developed a small left effusion with which appears larger compared with earlier today.   Continued follow-up is suggested.  The right lung is clear.  IMPRESSION: Tiny left apical pneumothorax is unchanged.  Large cavitary pneumonia left upper lobe is unchanged.  Small left effusion appears larger compared with earlier today.   Original Report Authenticated By: Janeece Riggers, M.D.   Dg Chest Port 1 View  04/17/2013   *RADIOLOGY REPORT*  Clinical Data: Status post left lung biopsy  PORTABLE CHEST - 1 VIEW  Comparison: 04/16/2013  Findings: Cardiac shadow is stable.  An apical cavitary lesion is again identified  within the left lung. A tiny pneumothorax is noted in the apex.  No other focal abnormality is seen.  No bony abnormality is noted.  IMPRESSION: Tiny left pneumothorax.  The remainder of the exam is stable.   Original Report Authenticated By: Alcide Clever, M.D.    Scheduled Meds: . amLODipine  10 mg Oral Daily  . benzonatate  100 mg Oral TID  . dextromethorphan  30 mg Oral BID  . ethambutol  15 mg/kg Oral Daily  . isoniazid  300 mg Oral Daily  . linezolid  600 mg Oral Q12H  . moxifloxacin  400 mg Oral Daily  . pyrazinamide  2,000 mg Oral Daily  . vitamin B-6  50 mg Oral Daily  . rifabutin  300 mg Oral Daily  . sodium chloride  3 mL Intravenous Q12H  . sulfamethoxazole-trimethoprim  1 tablet Oral Daily   Continuous Infusions:     Time spent:25 minutes    Monique [name]  [facility] Pager [phone] If 7PM-7AM, please contact night-coverage at [email or web address], password [facility] 04/19/2013, 10:47 AM  LOS: 10 days

## 2013-04-19 NOTE — Progress Notes (Signed)
Patient ID: Monique [name], female   DOB: 1971/03/13, 42 y.o.   MRN: [id number]         [facility] Infectious Disease    Date of Admission:  04/09/2013   Total days of antibiotics 11         Principal Problem:   Pneumonia with lung abscess Active Problems:   Sepsis   Hypokalemia   Acute kidney injury   Human immunodeficiency virus (HIV) disease   Acute respiratory failure with hypoxia   Dehydration with hyponatremia   . amLODipine  10 mg Oral Daily  . benzonatate  100 mg Oral TID  . dextromethorphan  30 mg Oral BID  . ethambutol  15 mg/kg Oral Daily  . isoniazid  300 mg Oral Daily  . linezolid  600 mg Oral Q12H  . moxifloxacin  400 mg Oral Daily  . pyrazinamide  2,000 mg Oral Daily  . vitamin B-6  50 mg Oral Daily  . rifabutin  300 mg Oral Daily  . sodium chloride  3 mL Intravenous Q12H  . sulfamethoxazole-trimethoprim  1 tablet Oral Daily    Subjective: Her chest pain has resolved but otherwise she feels the same Review of Systems: Pertinent items are noted in HPI.  Past Medical History  Diagnosis Date  . Tonsillitis   . Hypertension     History  Substance Use Topics  . Smoking status: Never Smoker   . Smokeless tobacco: Not on file  . Alcohol Use: No    No family history on file.  No Known Allergies  Objective: Temp:  [98.4 F (36.9 C)-102.7 F (39.3 C)] 102.7 F (39.3 C) (08/24 1332) Pulse Rate:  [89-106] 89 (08/24 0628) Resp:  [20] 20 (08/24 0628) BP: (107-142)/(70-86) 119/70 mmHg (08/24 0628) SpO2:  [94 %-100 %] 94 % (08/24 0628)  General: alert and comfortable Lungs: clear Cor: reg S1 and S2 without murmurs   Lab Results Lab Results  Component Value Date   WBC 13.0* 04/18/2013   HGB 9.1* 04/18/2013   HCT 26.1* 04/18/2013   MCV 83.1 04/18/2013   PLT 448* 04/18/2013    Lab Results  Component Value Date   CREATININE 1.11* 04/18/2013   BUN 8 04/18/2013   NA 132* 04/18/2013   K 4.1 04/18/2013   CL 101 04/18/2013   CO2 21  04/18/2013    HIV 1 RNA Quant (copies/mL)  Date Value  04/10/2013 73711*   Microbiology: Recent Results (from the past 240 hour(s))  MRSA PCR SCREENING     Status: None   Collection Time    04/09/13  3:05 PM      Result Value Range Status   MRSA by PCR NEGATIVE  NEGATIVE Final   Comment:            The GeneXpert MRSA Assay (FDA     approved for NASAL specimens     only), is one component of a     comprehensive MRSA colonization     surveillance program. It is not     intended to diagnose MRSA     infection nor to guide or     monitor treatment for     MRSA infections.  CULTURE, EXPECTORATED SPUTUM-ASSESSMENT     Status: None   Collection Time    04/09/13  5:18 PM      Result Value Range Status   Specimen Description SPUTUM   Final   Special Requests Normal   Final   Sputum evaluation  Final   Value: MICROSCOPIC FINDINGS SUGGEST THAT THIS SPECIMEN IS NOT REPRESENTATIVE OF LOWER RESPIRATORY SECRETIONS. PLEASE RECOLLECT.     CALLED TO RN S.MERLINI AT 0053 04/10/13 BY L.PITT   Report Status 04/10/2013 FINAL   Final  CULTURE, EXPECTORATED SPUTUM-ASSESSMENT     Status: None   Collection Time    04/10/13  3:04 AM      Result Value Range Status   Specimen Description SPUTUM   Final   Special Requests NONE   Final   Sputum evaluation     Final   Value: MICROSCOPIC FINDINGS SUGGEST THAT THIS SPECIMEN IS NOT REPRESENTATIVE OF LOWER RESPIRATORY SECRETIONS. PLEASE RECOLLECT.     CALLED TO MERLINI,S RN 04/10/2013 0336 JORDANS   Report Status 04/10/2013 FINAL   Final  AFB CULTURE WITH SMEAR     Status: None   Collection Time    04/10/13 11:00 AM      Result Value Range Status   Specimen Description SPUTUM   Final   Special Requests NONE   Final   ACID FAST SMEAR     Final   Value: NO ACID FAST BACILLI SEEN     Performed at [facility]   Culture     Final   Value: CULTURE WILL BE EXAMINED FOR 6 WEEKS BEFORE ISSUING A FINAL REPORT     Performed at [facility]     Report Status PENDING   Incomplete  AFB CULTURE WITH SMEAR     Status: None   Collection Time    04/11/13 11:42 AM      Result Value Range Status   Specimen Description SPUTUM   Final   Special Requests Immunocompromised   Final   ACID FAST SMEAR     Final   Value: NO ACID FAST BACILLI SEEN     Performed at [facility]   Culture     Final   Value: CULTURE WILL BE EXAMINED FOR 6 WEEKS BEFORE ISSUING A FINAL REPORT     Performed at [facility]   Report Status PENDING   Incomplete  AFB CULTURE WITH SMEAR     Status: None   Collection Time    04/12/13  3:04 PM      Result Value Range Status   Specimen Description NASOPHARYNGEAL   Final   Special Requests Immunocompromised   Final   ACID FAST SMEAR     Final   Value: NO ACID FAST BACILLI SEEN     Performed at [facility]   Culture     Final   Value: CULTURE WILL BE EXAMINED FOR 6 WEEKS BEFORE ISSUING A FINAL REPORT     Performed at [facility]   Report Status PENDING   Incomplete  AFB CULTURE WITH SMEAR     Status: None   Collection Time    04/13/13  5:40 PM      Result Value Range Status   Specimen Description BRONCHIAL ALVEOLAR LAVAGE   Final   Special Requests Immunocompromised   Final   ACID FAST SMEAR     Final   Value: NO ACID FAST BACILLI SEEN     Performed at [facility]   Culture     Final   Value: CULTURE WILL BE EXAMINED FOR 6 WEEKS BEFORE ISSUING A FINAL REPORT     Performed at [facility]   Report Status PENDING   Incomplete  FUNGUS CULTURE W SMEAR  Status: None   Collection Time    04/13/13  5:40 PM      Result Value Range Status   Specimen Description BRONCHIAL ALVEOLAR LAVAGE   Final   Special Requests Immunocompromised   Final   Fungal Smear     Final   Value: RARE YEAST     Performed at [facility]   Culture     Final   Value: CANDIDA ALBICANS     Performed at [facility]   Report Status PENDING   Incomplete  LEGIONELLA  CULTURE     Status: None   Collection Time    04/13/13  5:40 PM      Result Value Range Status   Specimen Description BRONCHIAL ALVEOLAR LAVAGE   Final   Special Requests Immunocompromised   Final   Culture     Final   Value: NO LEGIONELLA ISOLATED     Performed at [facility]   Report Status 04/19/2013 FINAL   Final  PNEUMOCYSTIS JIROVECI SMEAR BY DFA     Status: None   Collection Time    04/13/13  5:40 PM      Result Value Range Status   Specimen Source-PJSRC BRONCHIAL ALVEOLAR LAVAGE   Final   Pneumocystis jiroveci Ag NEGATIVE   Final   Comment: Performed at [facility]  RESPIRATORY VIRUS PANEL     Status: None   Collection Time    04/13/13  5:40 PM      Result Value Range Status   Source - RVPAN BRONCHIAL ALVEOLAR LAVAGE   Corrected   Comment: CORRECTED ON 08/19 AT 1841: PREVIOUSLY REPORTED AS BRONCHIAL ALVEOLAR LAVAGE   Respiratory Syncytial Virus A NOT DETECTED   Final   Respiratory Syncytial Virus B NOT DETECTED   Final   Influenza A NOT DETECTED   Final   Influenza B NOT DETECTED   Final   Parainfluenza 1 NOT DETECTED   Final   Parainfluenza 2 NOT DETECTED   Final   Parainfluenza 3 NOT DETECTED   Final   Metapneumovirus NOT DETECTED   Final   Rhinovirus NOT DETECTED   Final   Adenovirus NOT DETECTED   Final   Influenza A H1 NOT DETECTED   Final   Influenza A H3 NOT DETECTED   Final   Comment: (NOTE)           Normal Reference Range for each Analyte: NOT DETECTED     Testing performed using the Luminex xTAG Respiratory Viral Panel test     kit.     This test was developed and its performance characteristics determined     by [facility]. It has not been cleared or approved by the [place]     Food and Drug Administration. This test is used for clinical purposes.     It should not be regarded as investigational or for research. This     laboratory is certified under the Clinical Laboratory Improvement     Amendments of 1988 (CLIA) as  qualified to perform high complexity     clinical laboratory testing.     Performed at [facility]  M. TUBERCULOSIS COMPLEX BY PCR     Status: None   Collection Time    04/13/13  5:40 PM      Result Value Range Status   M. tuberculosis, Direct Not detected  Not detected Final   Comment: (NOTE)     This test(s) was  developed and its performance characteristics      have been determined by [employer],      [place], [place]. Performance characteristics refer to the analytical      performance of the test.     Performed at [facility]   Source (MTBPCR) BRONCHIAL ALVEOLAR LAVAGE   Final  CULTURE, BAL-QUANTITATIVE     Status: None   Collection Time    04/13/13  5:40 PM      Result Value Range Status   Specimen Description BRONCHIAL ALVEOLAR LAVAGE   Final   Special Requests NONE   Final   Gram Stain     Final   Value: RARE WBC PRESENT, PREDOMINANTLY PMN     NO SQUAMOUS EPITHELIAL CELLS SEEN     NO ORGANISMS SEEN     Performed at [facility] Count     Final   Value: 50,000 COLONIES/ML     Performed at [facility]   Culture     Final   Value: CANDIDA ALBICANS     Performed at [facility]   Report Status 04/16/2013 FINAL   Final  CLOSTRIDIUM DIFFICILE BY PCR     Status: None   Collection Time    04/14/13  2:26 PM      Result Value Range Status   C difficile by pcr NEGATIVE  NEGATIVE Final  FUNGUS CULTURE W SMEAR     Status: None   Collection Time    04/17/13 10:35 AM      Result Value Range Status   Specimen Description TISSUE LUNG LEFT   Final   Special Requests CT CORE LEFT LUNG ABSCESS   Final   Fungal Smear     Final   Value: NO YEAST OR FUNGAL ELEMENTS SEEN     Performed at [facility]   Culture     Final   Value: CULTURE IN PROGRESS FOR FOUR WEEKS     Performed at [facility]   Report Status PENDING   Incomplete  TISSUE CULTURE     Status: None   Collection Time     04/17/13 10:35 AM      Result Value Range Status   Specimen Description TISSUE LUNG LEFT   Final   Special Requests CT CORE LEFT LUNG ABCESS   Final   Gram Stain     Final   Value: NO WBC SEEN     NO ORGANISMS SEEN     Performed at [facility]   Culture     Final   Value: NO GROWTH 2 DAYS     Performed at [facility]   Report Status PENDING   Incomplete  AFB CULTURE WITH SMEAR     Status: None   Collection Time    04/17/13 10:36 AM      Result Value Range Status   Specimen Description TISSUE LUNG LEFT   Final   Special Requests CT CORE LEFT LUNG ABSCESS   Final   ACID FAST SMEAR     Final   Value: NO ACID FAST BACILLI SEEN     Performed at [facility]   Culture     Final   Value: CULTURE WILL BE EXAMINED FOR 6 WEEKS BEFORE ISSUING A FINAL REPORT     Performed at [facility]   Report Status PENDING   Incomplete    Assessment: So far all lung biopsy stains and  cultures are negative. Path report is pending.  Plan: 1. Continue broad empiric antimicrobial regimen for now  Cliffton Asters, MD [facility] [phone] pager   [phone] cell 04/19/2013, 2:09 PM

## 2013-04-20 LAB — HEPATIC FUNCTION PANEL
ALT: 97 U/L — ABNORMAL HIGH (ref 0–35)
Bilirubin, Direct: <0.1 mg/dL (ref 0.0–0.3)
Total Bilirubin: 0.2 mg/dL — ABNORMAL LOW (ref 0.3–1.2)

## 2013-04-20 LAB — HLA B*5701

## 2013-04-20 MED ORDER — FLUCONAZOLE 100 MG PO TABS
100.0000 mg | ORAL_TABLET | Freq: Every day | ORAL | Status: DC
  Administered 2013-04-20 – 2013-04-22 (×3): 100 mg via ORAL
  Filled 2013-04-20 (×3): qty 1

## 2013-04-20 MED ORDER — PYRAZINAMIDE 500 MG PO TABS
1500.0000 mg | ORAL_TABLET | Freq: Every day | ORAL | Status: DC
  Administered 2013-04-21 – 2013-04-22 (×2): 1500 mg via ORAL
  Filled 2013-04-20 (×2): qty 3

## 2013-04-20 MED ORDER — ETHAMBUTOL HCL 400 MG PO TABS
1200.0000 mg | ORAL_TABLET | Freq: Every day | ORAL | Status: DC
  Administered 2013-04-21 – 2013-04-22 (×2): 1200 mg via ORAL
  Filled 2013-04-20 (×2): qty 3

## 2013-04-20 NOTE — Progress Notes (Signed)
[facility]    Subjective: Patient states she is doing well. She notes continued sweats overnight and now very mild right sided abdominal pain (previously left sided last week). She denies N/V/D, constipation, or chills.    Antibiotics:  Anti-infectives   Start     Dose/Rate Route Frequency Ordered Stop   04/17/13 2200  linezolid (ZYVOX) tablet 600 mg     600 mg Oral Every 12 hours 04/17/13 1849     04/15/13 1600  rifabutin (MYCOBUTIN) capsule 300 mg     300 mg Oral Daily 04/15/13 1454     04/15/13 1000  ethambutol (MYAMBUTOL) tablet 1,400 mg     15 mg/kg  92.5 kg Oral Daily 04/15/13 0801     04/15/13 1000  rifampin (RIFADIN) capsule 600 mg  Status:  Discontinued     600 mg Oral Daily 04/15/13 0801 04/15/13 1454   04/15/13 1000  pyrazinamide tablet 2,000 mg     2,000 mg Oral Daily 04/15/13 0801     04/15/13 1000  isoniazid (NYDRAZID) tablet 300 mg     300 mg Oral Daily 04/15/13 0801     04/15/13 1000  moxifloxacin (AVELOX) tablet 400 mg     400 mg Oral Daily 04/15/13 0801     04/13/13 1845  sulfamethoxazole-trimethoprim (BACTRIM DS) 800-160 MG per tablet 1 tablet     1 tablet Oral Daily 04/13/13 1813     04/11/13 2000  Ampicillin-Sulbactam (UNASYN) 3 g in sodium chloride 0.9 % 100 mL IVPB  Status:  Discontinued     3 g 100 mL/hr over 60 Minutes Intravenous Every 6 hours 04/11/13 1439 04/15/13 1007   04/11/13 1500  vancomycin (VANCOCIN) IVPB 1000 mg/200 mL premix  Status:  Discontinued     1,000 mg 200 mL/hr over 60 Minutes Intravenous Every 12 hours 04/11/13 1441 04/13/13 0935   04/10/13 1400  vancomycin (VANCOCIN) IVPB 1000 mg/200 mL premix  Status:  Discontinued     1,000 mg 200 mL/hr over 60 Minutes Intravenous Every 24 hours 04/09/13 1323 04/11/13 1441   04/09/13 2000  Ampicillin-Sulbactam (UNASYN) 3 g in sodium chloride 0.9 % 100 mL IVPB  Status:  Discontinued     3 g 100 mL/hr over 60 Minutes Intravenous Every 8 hours 04/09/13 1328 04/11/13  1439   04/09/13 1330  vancomycin (VANCOCIN) 1,250 mg in sodium chloride 0.9 % 250 mL IVPB     1,250 mg 166.7 mL/hr over 90 Minutes Intravenous STAT 04/09/13 1322 04/09/13 1538   04/09/13 1315  clindamycin (CLEOCIN) IVPB 600 mg     600 mg 100 mL/hr over 30 Minutes Intravenous  Once 04/09/13 1302 04/09/13 1359   04/09/13 0900  cefTRIAXone (ROCEPHIN) 1 g in dextrose 5 % 50 mL IVPB     1 g 100 mL/hr over 30 Minutes Intravenous  Once 04/09/13 0850 04/09/13 1042   04/09/13 0900  azithromycin (ZITHROMAX) 500 mg in dextrose 5 % 250 mL IVPB     500 mg 250 mL/hr over 60 Minutes Intravenous  Once 04/09/13 0850 04/09/13 1227      Medications: Scheduled Meds: . amLODipine  10 mg Oral Daily  . benzonatate  100 mg Oral TID  . dextromethorphan  30 mg Oral BID  . ethambutol  15 mg/kg Oral Daily  . isoniazid  300 mg Oral Daily  . linezolid  600 mg Oral Q12H  . moxifloxacin  400 mg Oral Daily  . pyrazinamide  2,000 mg Oral Daily  .  vitamin B-6  50 mg Oral Daily  . rifabutin  300 mg Oral Daily  . sodium chloride  3 mL Intravenous Q12H  . sulfamethoxazole-trimethoprim  1 tablet Oral Daily   Continuous Infusions:  PRN Meds:.acetaminophen, acetaminophen, chlorpheniramine-HYDROcodone, HYDROcodone-acetaminophen, oxyCODONE   Objective: Weight change:   Intake/Output Summary (Last 24 hours) at 04/20/13 1155 Last data filed at 04/20/13 1035  Gross per 24 hour  Intake      3 ml  Output      0 ml  Net      3 ml   Blood pressure 144/87, pulse 93, temperature 98.4 F (36.9 C), temperature source Oral, resp. rate 20, height 5\' 2"  (1.575 m), weight 92.534 kg (204 lb), last menstrual period 03/09/2013, SpO2 100.00%. Temp:  [98.4 F (36.9 C)-102.7 F (39.3 C)] 98.4 F (36.9 C) (08/25 0700) Pulse Rate:  [93-109] 93 (08/25 0700) Resp:  [20] 20 (08/25 0700) BP: (114-144)/(62-87) 144/87 mmHg (08/25 1035) SpO2:  [94 %-100 %] 100 % (08/25 0700)  Physical Exam: General: Alert and awake, oriented x3,  not in any acute distress. HEENT: anicteric sclera, pupils reactive to light and accommodation, EOMI, minor oral thrush CVS regular rate, normal r,  no murmur rubs or gallops Chest: improving but decreased breath sounds LUL, coarse breath sounds throughout remaining left lung, R CTA Abdomen: soft nontender, nondistended, normal bowel sounds, Extremities: 2+ pitting edema B/L LE Skin: no rashes Lymph: no new lymphadenopathy Neuro: nonfocal  Lab Results:  Recent Labs  04/18/13 1515  WBC 13.0*  HGB 9.1*  HCT 26.1*  PLT 448*    BMET  Recent Labs  04/18/13 1515  NA 132*  K 4.1  CL 101  CO2 21  GLUCOSE 100*  BUN 8  CREATININE 1.11*  CALCIUM 8.7    Micro Results: Recent Results (from the past 240 hour(s))  AFB CULTURE WITH SMEAR     Status: None   Collection Time    04/11/13 11:42 AM      Result Value Range Status   Specimen Description SPUTUM   Final   Special Requests Immunocompromised   Final   ACID FAST SMEAR     Final   Value: NO ACID FAST BACILLI SEEN     Performed at [facility]   Culture     Final   Value: CULTURE WILL BE EXAMINED FOR 6 WEEKS BEFORE ISSUING A FINAL REPORT     Performed at [facility]   Report Status PENDING   Incomplete  AFB CULTURE WITH SMEAR     Status: None   Collection Time    04/12/13  3:04 PM      Result Value Range Status   Specimen Description NASOPHARYNGEAL   Final   Special Requests Immunocompromised   Final   ACID FAST SMEAR     Final   Value: NO ACID FAST BACILLI SEEN     Performed at [facility]   Culture     Final   Value: CULTURE WILL BE EXAMINED FOR 6 WEEKS BEFORE ISSUING A FINAL REPORT     Performed at [facility]   Report Status PENDING   Incomplete  AFB CULTURE WITH SMEAR     Status: None   Collection Time    04/13/13  5:40 PM      Result Value Range Status   Specimen Description BRONCHIAL ALVEOLAR LAVAGE   Final   Special Requests Immunocompromised   Final   ACID FAST SMEAR  Final   Value: NO ACID FAST BACILLI SEEN     Performed at [facility]   Culture     Final   Value: CULTURE WILL BE EXAMINED FOR 6 WEEKS BEFORE ISSUING A FINAL REPORT     Performed at [facility]   Report Status PENDING   Incomplete  FUNGUS CULTURE W SMEAR     Status: None   Collection Time    04/13/13  5:40 PM      Result Value Range Status   Specimen Description BRONCHIAL ALVEOLAR LAVAGE   Final   Special Requests Immunocompromised   Final   Fungal Smear     Final   Value: RARE YEAST     Performed at [facility]   Culture     Final   Value: CANDIDA ALBICANS     Performed at [facility]   Report Status PENDING   Incomplete  LEGIONELLA CULTURE     Status: None   Collection Time    04/13/13  5:40 PM      Result Value Range Status   Specimen Description BRONCHIAL ALVEOLAR LAVAGE   Final   Special Requests Immunocompromised   Final   Culture     Final   Value: NO LEGIONELLA ISOLATED     Performed at [facility]   Report Status 04/19/2013 FINAL   Final  PNEUMOCYSTIS JIROVECI SMEAR BY DFA     Status: None   Collection Time    04/13/13  5:40 PM      Result Value Range Status   Specimen Source-PJSRC BRONCHIAL ALVEOLAR LAVAGE   Final   Pneumocystis jiroveci Ag NEGATIVE   Final   Comment: Performed at [facility]  RESPIRATORY VIRUS PANEL     Status: None   Collection Time    04/13/13  5:40 PM      Result Value Range Status   Source - RVPAN BRONCHIAL ALVEOLAR LAVAGE   Corrected   Comment: CORRECTED ON 08/19 AT 1841: PREVIOUSLY REPORTED AS BRONCHIAL ALVEOLAR LAVAGE   Respiratory Syncytial Virus A NOT DETECTED   Final   Respiratory Syncytial Virus B NOT DETECTED   Final   Influenza A NOT DETECTED   Final   Influenza B NOT DETECTED   Final   Parainfluenza 1 NOT DETECTED   Final   Parainfluenza 2 NOT DETECTED   Final   Parainfluenza 3 NOT DETECTED   Final   Metapneumovirus NOT DETECTED   Final   Rhinovirus NOT  DETECTED   Final   Adenovirus NOT DETECTED   Final   Influenza A H1 NOT DETECTED   Final   Influenza A H3 NOT DETECTED   Final   Comment: (NOTE)           Normal Reference Range for each Analyte: NOT DETECTED     Testing performed using the Luminex xTAG Respiratory Viral Panel test     kit.     This test was developed and its performance characteristics determined     by [facility]. It has not been cleared or approved by the [place]     Food and Drug Administration. This test is used for clinical purposes.     It should not be regarded as investigational or for research. This     laboratory is certified under the Clinical Laboratory Improvement     Amendments of 1988 (CLIA) as qualified to perform high complexity  clinical laboratory testing.     Performed at [facility]  M. TUBERCULOSIS COMPLEX BY PCR     Status: None   Collection Time    04/13/13  5:40 PM      Result Value Range Status   M. tuberculosis, Direct Not detected  Not detected Final   Comment: (NOTE)     This test(s) was developed and its performance characteristics      have been determined by [employer],      [place], [place]. Performance characteristics refer to the analytical      performance of the test.     Performed at [facility]   Source (MTBPCR) BRONCHIAL ALVEOLAR LAVAGE   Final  CULTURE, BAL-QUANTITATIVE     Status: None   Collection Time    04/13/13  5:40 PM      Result Value Range Status   Specimen Description BRONCHIAL ALVEOLAR LAVAGE   Final   Special Requests NONE   Final   Gram Stain     Final   Value: RARE WBC PRESENT, PREDOMINANTLY PMN     NO SQUAMOUS EPITHELIAL CELLS SEEN     NO ORGANISMS SEEN     Performed at [facility]     Final   Value: 50,000 COLONIES/ML     Performed at [facility]   Culture     Final   Value: CANDIDA ALBICANS     Performed at [facility]   Report Status 04/16/2013 FINAL    Final  CLOSTRIDIUM DIFFICILE BY PCR     Status: None   Collection Time    04/14/13  2:26 PM      Result Value Range Status   C difficile by pcr NEGATIVE  NEGATIVE Final  FUNGUS CULTURE W SMEAR     Status: None   Collection Time    04/17/13 10:35 AM      Result Value Range Status   Specimen Description TISSUE LUNG LEFT   Final   Special Requests CT CORE LEFT LUNG ABSCESS   Final   Fungal Smear     Final   Value: NO YEAST OR FUNGAL ELEMENTS SEEN     Performed at [facility]   Culture     Final   Value: CULTURE IN PROGRESS FOR FOUR WEEKS     Performed at [facility]   Report Status PENDING   Incomplete  TISSUE CULTURE     Status: None   Collection Time    04/17/13 10:35 AM      Result Value Range Status   Specimen Description TISSUE LUNG LEFT   Final   Special Requests CT CORE LEFT LUNG ABCESS   Final   Gram Stain     Final   Value: NO WBC SEEN     NO ORGANISMS SEEN     Performed at [facility]   Culture     Final   Value: NO GROWTH 3 DAYS     Performed at [facility]   Report Status 04/20/2013 FINAL   Final  AFB CULTURE WITH SMEAR     Status: None   Collection Time    04/17/13 10:36 AM      Result Value Range Status   Specimen Description TISSUE LUNG LEFT   Final   Special Requests CT CORE LEFT LUNG ABSCESS   Final   ACID FAST SMEAR     Final  Value: NO ACID FAST BACILLI SEEN     Performed at [facility]   Culture     Final   Value: CULTURE WILL BE EXAMINED FOR 6 WEEKS BEFORE ISSUING A FINAL REPORT     Performed at [facility]   Report Status PENDING   Incomplete    Studies/Results: No results found.    Assessment/Plan: Monique [name] is a 42 y.o. female with who presented with fevers/chills, left sided substernal chest pain TTP. Chest CT consistent with pneumonia and LUL abscess with need for f/u to confirm clearance and exclude any malignancy. HIV positive and was placed on airborne precautions for TB  despite negative AFB sputum.  She underwent CT guided biopsy of her left lung on 04/17/13. Sent for path and culture which remain negative to date.  Anti-infectives: Bactrim (8/18>> Avelox (8/20>> RIPE (8/20>> Zyvox (8/22>> Fluconazole (8/25>>  Pneumonia, with abscess: Biopsy NTD --continue avelox for strep + anerobe coverage  --f/u blood cx, NGTD  --BAL with C. albicans however no significance and no oral thrush seen on exam  --Respiratory virus panel, negative  --CT biopsy results NTD   HIV: Confirmatory testing with Western Blot, POSITIVE. Wishes for NO INFORMATION TO BE DISCUSSED WITH FAMILY PRESENT  --CD4 = 70, VL = 73,711  --HLA [id number], negative  --HIV genotype, No relevant mutations noted --With treatment of pulmonary TB will need to wait 2 weeks (04/29/13 minimum) prior to initiation of HAART to decrease risk of IRIS  --continue Bactrim for PCP ppx  --oral thrush, will treat her with fluconazole  Rule out TB:  --may d/c airborne precautions --Quantiferon Gold POSITIVE  --AFB sputum x 3, negative; BAL: NGTD   --continue rifabutin, INH, B6, pyrazinamide, ethambutol   LOS: 11 days   Lewie Chamber 04/20/2013, 11:55 AM

## 2013-04-20 NOTE — Progress Notes (Signed)
INFECTIOUS DISEASE ATTENDING ADDENDUM:     [facility]   Date: 04/20/2013  Patient name: Monique [name]  Medical record number: [id number]  Date of birth: 14-Aug-1971    This patient has been seen and discussed with the house staff. Please see their note for complete details. I concur with their findings with the following additions/corrections:  Patient with all AFB smears negative and MTB PCR on bronchoscopy also negative  Still feel should rx for MTB AND LUNG abscess  Waiting to see what pathology shows today  I think we are ultimately going to need help from CT surgery in this case as I am worried she may not be able to clear this severe infection up ONLY with antibiotics  HOLD off on ARV's until we have reached 2 week mark post starting MTB drugs    [name] 04/20/2013, 12:11 PM

## 2013-04-20 NOTE — Progress Notes (Signed)
[facility] PROGRESS NOTE  Monique [name] ZOX:[id number] DOB: [birth year] DOA: 04/09/2013 PCP: Default, Provider, MD  Brief narrative:  42 year old female patient with history of hypertension and tobacco abuse presented to the emergency department with subjective fevers and chills for 3 days and nonproductive cough. This was associated with anorexia for one week. The evening prior to presentation she had been cleaning her house she developed sharp pain in her left chest that was quite severe and was associated with shortness of breath. In the emergency department the patient was tachycardic with a heart rate of 120 and respiratory rate of 25. she had a fever of 102.8 and her O2 sats are 90% on room air. She had leukocytosis with a white count of 13,700 hemoglobin was 10.4 sodium 125 potassium 2.6 bicarbonate 18 creatinine 1.65. Portable chest x-ray was concerning for a mass in the left upper lobe. CT angiogram of the chest revealed consolidation in the left upper lobe consistent with pneumonia and a possible central pulmonary abscess. No evidence of pulmonary embolus. Because of her symptoms and vital signs she was given 3 L of normal saline and started on IV Rocephin and azithromycin followed by a dose of IV clindamycin and was admitted to the step down unit. Infectious disease and pulmonology was consulted.   Assessment/Plan:  Sepsis  Secondary to left lung abscess and pneumonia  - Chest x-ray done on 8/21 showed expansion of the left upper lobe masslike area of consolidation with increasing central cavitation.  - CT guided biopsy of the left lung mass done on 8/22. Tissue cx so fat negative for growth  - Started on RIPE +avelox on 8/20, continue bactrim for prophylaxis - C. difficile negative  - CBC, BMET improved.   Acute respiratory failure with hypoxia/Pneumonia with left upper lung abscess:  Sats better  -3 AFB's are negative,Quantiferon Gold positive, concern given new dx HIV, low  CD4 70, decision was made by infectious disease to treat for active pulmonary TB.  - Bronched on 08/18, BAL with C. albicans however no significance, no oral thrush seen on exam, benign reactive changes on the bronchial washing.  - Cont ethambutol, INH, Pyrazinamide, rifampin, pyridoxine, covering with zyvox and  Avelox for lung abscess. - will need to wait for 2 weeks prior to initiation of HAART  - bx of lung mass shows no growth d/c airborne isolation. -continues to have temp spikes. Will get CT surgery to evaluate as per ID recommendations - on Bactrim for PCP prophylaxis .   Acute kidney injury/ Dehydration with hyponatremia  Improving.  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   DVT Prophylaxis: Lovenox  Code Status: Full code  Disposition: home pending improvement in sx   Consults:  Pulmonology-  Infectious disease Dr. Algis Liming / [facility]  Interventional radiology   Antibiotics  Ethambutol  Isoniazid  Pyrazinamide  Rifampin  pyridoxine  Avelox  Bactrim 8/18 >>       HPI/Subjective: Patient seen and examiend this am. Denies SOB, but continues to have temp spikes.   Objective: Filed Vitals:   04/20/13 1035  BP: 144/87  Pulse:   Temp:   Resp:     Intake/Output Summary (Last 24 hours) at 04/20/13 1413 Last data filed at 04/20/13 1035  Gross per 24 hour  Intake      3 ml  Output      0 ml  Net      3 ml   Filed Weights   04/11/13 0400 04/12/13 0421 04/13/13 1706  Weight: 88.4 kg (194 lb 14.2 oz) 92.7 kg (204 lb 5.9 oz) 92.534 kg (204 lb)    Exam: General: Middle aged female in no acute distress  HEENT: No pallor, moist oral mucosa  Chest: Left basal crackles, no added sounds  CVS: Normal S1 and S2, no murmurs rub or gallop  Abdomen: Soft, nontender, nondistended, bowel sounds present  Extremities: Warm, no edema  CNS:  AAO x3  Data Reviewed: Basic Metabolic Panel:  Recent Labs Lab 04/15/13 0500 04/16/13 0500 04/17/13 0520 04/18/13 1515  NA 137 134* 135 132*  K 3.8 3.5 3.6 4.1  CL 107 102 102 101  CO2 18* 19 21 21   GLUCOSE 90 100* 82 100*  BUN 12 12 9 8   CREATININE 1.24* 1.40* 1.30* 1.11*  CALCIUM 8.1* 8.7 8.5 8.7   Liver Function Tests:  Recent Labs Lab 04/20/13 1120  AST 131*  ALT 97*  ALKPHOS 81  BILITOT 0.2*  PROT 7.8  ALBUMIN 2.0*   No results found for this basename: LIPASE, AMYLASE,  in the last 168 hours No results found for this basename: AMMONIA,  in the last 168 hours CBC:  Recent Labs Lab 04/15/13 0500 04/16/13 0500 04/17/13 0520 04/18/13 1515  WBC 23.2* 19.7* 15.0* 13.0*  HGB 8.7* 8.4* 8.5* 9.1*  HCT 23.9* 24.0* 24.3* 26.1*  MCV 81.0 80.8 81.8 83.1  PLT 363 428* 455* 448*   Cardiac Enzymes: No results found for this basename: CKTOTAL, CKMB, CKMBINDEX, TROPONINI,  in the last 168 hours BNP (last 3 results) No results found for this basename: PROBNP,  in the last 8760 hours CBG: No results found for this basename: GLUCAP,  in the last 168 hours  Recent Results (from the past 240 hour(s))  AFB CULTURE WITH SMEAR     Status: None   Collection Time    04/11/13 11:42 AM      Result Value Range Status   Specimen Description SPUTUM   Final   Special Requests Immunocompromised   Final   ACID FAST SMEAR     Final   Value: NO ACID FAST BACILLI SEEN     Performed at [facility]   Culture     Final   Value: CULTURE WILL BE EXAMINED FOR 6 WEEKS BEFORE ISSUING A FINAL REPORT     Performed at [facility]   Report Status PENDING   Incomplete  AFB CULTURE WITH SMEAR     Status: None   Collection Time    04/12/13  3:04 PM      Result Value Range Status   Specimen Description NASOPHARYNGEAL   Final   Special Requests Immunocompromised   Final   ACID FAST SMEAR     Final   Value: NO ACID FAST BACILLI SEEN     Performed at [facility]    Culture     Final   Value: CULTURE WILL BE EXAMINED FOR 6 WEEKS BEFORE ISSUING A FINAL REPORT     Performed at [facility]   Report Status PENDING   Incomplete  AFB CULTURE WITH SMEAR     Status: None   Collection Time    04/13/13  5:40 PM      Result Value Range Status   Specimen Description BRONCHIAL ALVEOLAR LAVAGE   Final  Special Requests Immunocompromised   Final   ACID FAST SMEAR     Final   Value: NO ACID FAST BACILLI SEEN     Performed at [facility]   Culture     Final   Value: CULTURE WILL BE EXAMINED FOR 6 WEEKS BEFORE ISSUING A FINAL REPORT     Performed at [facility]   Report Status PENDING   Incomplete  FUNGUS CULTURE W SMEAR     Status: None   Collection Time    04/13/13  5:40 PM      Result Value Range Status   Specimen Description BRONCHIAL ALVEOLAR LAVAGE   Final   Special Requests Immunocompromised   Final   Fungal Smear     Final   Value: RARE YEAST     Performed at [facility]   Culture     Final   Value: CANDIDA ALBICANS     Performed at [facility]   Report Status PENDING   Incomplete  LEGIONELLA CULTURE     Status: None   Collection Time    04/13/13  5:40 PM      Result Value Range Status   Specimen Description BRONCHIAL ALVEOLAR LAVAGE   Final   Special Requests Immunocompromised   Final   Culture     Final   Value: NO LEGIONELLA ISOLATED     Performed at [facility]   Report Status 04/19/2013 FINAL   Final  PNEUMOCYSTIS JIROVECI SMEAR BY DFA     Status: None   Collection Time    04/13/13  5:40 PM      Result Value Range Status   Specimen Source-PJSRC BRONCHIAL ALVEOLAR LAVAGE   Final   Pneumocystis jiroveci Ag NEGATIVE   Final   Comment: Performed at [facility]  RESPIRATORY VIRUS PANEL     Status: None   Collection Time    04/13/13  5:40 PM      Result Value Range Status   Source - RVPAN BRONCHIAL ALVEOLAR LAVAGE   Corrected   Comment: CORRECTED ON 08/19 AT 1841:  PREVIOUSLY REPORTED AS BRONCHIAL ALVEOLAR LAVAGE   Respiratory Syncytial Virus A NOT DETECTED   Final   Respiratory Syncytial Virus B NOT DETECTED   Final   Influenza A NOT DETECTED   Final   Influenza B NOT DETECTED   Final   Parainfluenza 1 NOT DETECTED   Final   Parainfluenza 2 NOT DETECTED   Final   Parainfluenza 3 NOT DETECTED   Final   Metapneumovirus NOT DETECTED   Final   Rhinovirus NOT DETECTED   Final   Adenovirus NOT DETECTED   Final   Influenza A H1 NOT DETECTED   Final   Influenza A H3 NOT DETECTED   Final   Comment: (NOTE)           Normal Reference Range for each Analyte: NOT DETECTED     Testing performed using the Lum[employer] xTAG Respiratory Viral Panel test     kit.     This test was developed and its performance characteristics determined     by [facility]. It has not been cleared or approved by the [place]     Food and Drug Administration. This test is used for clinical purposes.     It should not be regarded as investigational or for research. This     laboratory is certified under the Clinical Laboratory Improvement  Amendments of 1988 (CLIA) as qualified to perform high complexity     clinical laboratory testing.     Performed at [facility]  M. TUBERCULOSIS COMPLEX BY PCR     Status: None   Collection Time    04/13/13  5:40 PM      Result Value Range Status   M. tuberculosis, Direct Not detected  Not detected Final   Comment: (NOTE)     This test(s) was developed and its performance characteristics      have been determined by [facility],      [place], [place]. Performance characteristics refer to the analytical      performance of the test.     Performed at [facility]   Source (MTBPCR) BRONCHIAL ALVEOLAR LAVAGE   Final  CULTURE, BAL-QUANTITATIVE     Status: None   Collection Time    04/13/13  5:40 PM      Result Value Range Status   Specimen Description BRONCHIAL ALVEOLAR LAVAGE   Final   Special  Requests NONE   Final   Gram Stain     Final   Value: RARE WBC PRESENT, PREDOMINANTLY PMN     NO SQUAMOUS EPITHELIAL CELLS SEEN     NO ORGANISMS SEEN     Performed at [facility] Count     Final   Value: 50,000 COLONIES/ML     Performed at [facility]   Culture     Final   Value: CANDIDA ALBICANS     Performed at [facility]   Report Status 04/16/2013 FINAL   Final  CLOSTRIDIUM DIFFICILE BY PCR     Status: None   Collection Time    04/14/13  2:26 PM      Result Value Range Status   C difficile by pcr NEGATIVE  NEGATIVE Final  FUNGUS CULTURE W SMEAR     Status: None   Collection Time    04/17/13 10:35 AM      Result Value Range Status   Specimen Description TISSUE LUNG LEFT   Final   Special Requests CT CORE LEFT LUNG ABSCESS   Final   Fungal Smear     Final   Value: NO YEAST OR FUNGAL ELEMENTS SEEN     Performed at [facility]   Culture     Final   Value: CULTURE IN PROGRESS FOR FOUR WEEKS     Performed at [facility]   Report Status PENDING   Incomplete  TISSUE CULTURE     Status: None   Collection Time    04/17/13 10:35 AM      Result Value Range Status   Specimen Description TISSUE LUNG LEFT   Final   Special Requests CT CORE LEFT LUNG ABCESS   Final   Gram Stain     Final   Value: NO WBC SEEN     NO ORGANISMS SEEN     Performed at [facility]   Culture     Final   Value: NO GROWTH 3 DAYS     Performed at [facility]   Report Status 04/20/2013 FINAL   Final  AFB CULTURE WITH SMEAR     Status: None   Collection Time    04/17/13 10:36 AM      Result Value Range Status   Specimen Description TISSUE LUNG LEFT   Final   Special Requests CT CORE LEFT LUNG ABSCESS  Final   ACID FAST SMEAR     Final   Value: NO ACID FAST BACILLI SEEN     Performed at [facility]   Culture     Final   Value: CULTURE WILL BE EXAMINED FOR 6 WEEKS BEFORE ISSUING A FINAL REPORT     Performed at [facility]   Report Status PENDING   Incomplete     Studies: No results found.  Scheduled Meds: . amLODipine  10 mg Oral Daily  . benzonatate  100 mg Oral TID  . dextromethorphan  30 mg Oral BID  . [START ON 04/21/2013] ethambutol  1,200 mg Oral Daily  . fluconazole  100 mg Oral Daily  . isoniazid  300 mg Oral Daily  . linezolid  600 mg Oral Q12H  . moxifloxacin  400 mg Oral Daily  . [START ON 04/21/2013] pyrazinamide  1,500 mg Oral Daily  . vitamin B-6  50 mg Oral Daily  . rifabutin  300 mg Oral Daily  . sodium chloride  3 mL Intravenous Q12H  . sulfamethoxazole-trimethoprim  1 tablet Oral Daily   Continuous Infusions:      Time spent:25 minutes    Jerie Basford  [facility] Pager [phone]. If 7PM-7AM, please contact night-coverage at [email or web address], password [facility] 04/20/2013, 2:13 PM  LOS: 11 days

## 2013-04-21 ENCOUNTER — Ambulatory Visit: Payer: Self-pay

## 2013-04-21 DIAGNOSIS — J852 Abscess of lung without pneumonia: Secondary | ICD-10-CM

## 2013-04-21 DIAGNOSIS — R509 Fever, unspecified: Secondary | ICD-10-CM

## 2013-04-21 LAB — BASIC METABOLIC PANEL
Calcium: 8.7 mg/dL (ref 8.4–10.5)
GFR calc Af Amer: 73 mL/min — ABNORMAL LOW (ref >90)
GFR calc non Af Amer: 63 mL/min — ABNORMAL LOW (ref >90)
Glucose, Bld: 86 mg/dL (ref 70–99)
Potassium: 4.2 mEq/L (ref 3.5–5.1)
Sodium: 135 mEq/L (ref 135–145)

## 2013-04-21 LAB — CBC
Hemoglobin: 8.5 g/dL — ABNORMAL LOW (ref 12.0–15.0)
MCH: 28.9 pg (ref 26.0–34.0)
MCHC: 34.6 g/dL (ref 30.0–36.0)
Platelets: 390 10*3/uL (ref 150–400)

## 2013-04-21 MED ORDER — SODIUM CHLORIDE 0.9 % IV SOLN
1.0000 g | Freq: Three times a day (TID) | INTRAVENOUS | Status: DC
  Administered 2013-04-21 – 2013-04-22 (×4): 1 g via INTRAVENOUS
  Filled 2013-04-21 (×7): qty 1

## 2013-04-21 MED ORDER — VANCOMYCIN HCL IN DEXTROSE 1-5 GM/200ML-% IV SOLN
1000.0000 mg | Freq: Two times a day (BID) | INTRAVENOUS | Status: DC
  Administered 2013-04-21 – 2013-04-22 (×3): 1000 mg via INTRAVENOUS
  Filled 2013-04-21 (×4): qty 200

## 2013-04-21 NOTE — Progress Notes (Signed)
[facility] PROGRESS NOTE  Monique [name] ZOX:[id number] DOB: [birth year] DOA: 04/09/2013 PCP: Default, Provider, MD  Brief narrative:  42 year old female patient with history of hypertension and tobacco abuse presented to the emergency department with subjective fevers and chills for 3 days and nonproductive cough. This was associated with anorexia for one week. The evening prior to presentation she had been cleaning her house she developed sharp pain in her left chest that was quite severe and was associated with shortness of breath. In the emergency department the patient was tachycardic with a heart rate of 120 and respiratory rate of 25. she had a fever of 102.8 and her O2 sats are 90% on room air. She had leukocytosis with a white count of 13,700 hemoglobin was 10.4 sodium 125 potassium 2.6 bicarbonate 18 creatinine 1.65. Portable chest x-ray was concerning for a mass in the left upper lobe. CT angiogram of the chest revealed consolidation in the left upper lobe consistent with pneumonia and a possible central pulmonary abscess. No evidence of pulmonary embolus. Because of her symptoms and vital signs she was given 3 L of normal saline and started on IV Rocephin and azithromycin followed by a dose of IV clindamycin and was admitted to the step down unit. Infectious disease and pulmonology was consulted.   Assessment/Plan:  Sepsis  Secondary to left lung abscess and pneumonia  - Chest x-ray done on 8/21 showed expansion of the left upper lobe masslike area of consolidation with increasing central cavitation.  - CT guided biopsy of the left lung mass done on 8/22. Tissue cx so fat negative for growth  - Started on RIPE +avelox on 8/20, continue bactrim for prophylaxis  - C. difficile negative  - CBC, BMET improved.   Acute respiratory failure with hypoxia/Pneumonia with left upper lung abscess:  Sats better  -3 AFB's are negative,Quantiferon Gold positive, concern given new dx HIV, low  CD4 70, decision was made by infectious disease to treat for active pulmonary TB.  - Bronched on 08/18, BAL with C. albicans however no significance, no oral thrush seen on exam, benign reactive changes on the bronchial washing.  - Cont ethambutol, INH, Pyrazinamide, rifampin, pyridoxine, covering with zyvox and Avelox for lung abscess.  -Antibiotic switched to IV vancomycin and imipenem per ID today. Ordered for a PICC line placement. Discussed with case Production designer, theatre/television/film.request to home health agency  made for free abx. - will need to wait for another one week prior to initiation of HAART  - bx of lung mass shows no growth d/c airborne isolation.  -continues to have temp spikes.  Evaluated by CT surgery and do  not think she would need any surgical intervention at this time and feel fever likely from her abscess burden.   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   DVT Prophylaxis: Lovenox  Code Status: Full code   Disposition: home pending improvement in sx   Consults:  Pulmonology-  Infectious disease Dr. Algis Liming / [facility]  Interventional radiology  CTVS  Antibiotics  Ethambutol  Isoniazid  Pyrazinamide  Rifampin  pyridoxine  Avelox  Bactrim 8/18 >>     HPI/Subjective:  Patient seen and examiend this am. Denies SOB, but continues to have temp spikes.    Objective: Filed Vitals:  04/21/13 1358  BP: 113/62  Pulse: 103  Temp: 99.3 F (37.4 C)  Resp: 20   No intake or output data in the 24 hours ending 04/21/13 1621 Filed Weights   04/11/13 0400 04/12/13 0421 04/13/13 1706  Weight: 88.4 kg (194 lb 14.2 oz) 92.7 kg (204 lb 5.9 oz) 92.534 kg (204 lb)    Exam:  General: Middle aged female in no acute distress  HEENT: No pallor, moist oral mucosa  Chest: Clear  breath sounds bilaterally, no added sounds  CVS: Normal S1 and S2, no murmurs rub or gallop  Abdomen: Soft, nontender, nondistended, bowel sounds present  Extremities: Warm, no edema  CNS: AAO x3   Data Reviewed: Basic Metabolic Panel:  Recent Labs Lab 04/15/13 0500 04/16/13 0500 04/17/13 0520 04/18/13 1515 04/21/13 0430  NA 137 134* 135 132* 135  K 3.8 3.5 3.6 4.1 4.2  CL 107 102 102 101 105  CO2 18* 19 21 21 22   GLUCOSE 90 100* 82 100* 86  BUN 12 12 9 8 8   CREATININE 1.24* 1.40* 1.30* 1.11* 1.07  CALCIUM 8.1* 8.7 8.5 8.7 8.7   Liver Function Tests:  Recent Labs Lab 04/20/13 1120  AST 131*  ALT 97*  ALKPHOS 81  BILITOT 0.2*  PROT 7.8  ALBUMIN 2.0*   No results found for this basename: LIPASE, AMYLASE,  in the last 168 hours No results found for this basename: AMMONIA,  in the last 168 hours CBC:  Recent Labs Lab 04/15/13 0500 04/16/13 0500 04/17/13 0520 04/18/13 1515 04/21/13 0430  WBC 23.2* 19.7* 15.0* 13.0* 9.9  HGB 8.7* 8.4* 8.5* 9.1* 8.5*  HCT 23.9* 24.0* 24.3* 26.1* 24.6*  MCV 81.0 80.8 81.8 83.1 83.7  PLT 363 428* 455* 448* 390   Cardiac Enzymes: No results found for this basename: CKTOTAL, CKMB, CKMBINDEX, TROPONINI,  in the last 168 hours BNP (last 3 results) No results found for this basename: PROBNP,  in the last 8760 hours CBG: No results found for this basename: GLUCAP,  in the last 168 hours  Recent Results (from the past 240 hour(s))  AFB CULTURE WITH SMEAR     Status: None   Collection Time    04/12/13  3:04 PM      Result Value Range Status   Specimen Description NASOPHARYNGEAL   Final   Special Requests Immunocompromised   Final   ACID FAST SMEAR     Final   Value: NO ACID FAST BACILLI SEEN     Performed at [facility]   Culture     Final   Value: CULTURE WILL BE EXAMINED FOR 6 WEEKS BEFORE ISSUING A FINAL REPORT     Performed at [facility]   Report Status PENDING   Incomplete  AFB CULTURE WITH SMEAR      Status: None   Collection Time    04/13/13  5:40 PM      Result Value Range Status   Specimen Description BRONCHIAL ALVEOLAR LAVAGE   Final   Special Requests Immunocompromised   Final   ACID FAST SMEAR     Final   Value: NO ACID FAST BACILLI SEEN     Performed at [facility]   Culture     Final   Value: CULTURE WILL BE EXAMINED FOR 6 WEEKS BEFORE ISSUING A FINAL REPORT     Performed at [facility]   Report Status PENDING   Incomplete  FUNGUS CULTURE W SMEAR  Status: None   Collection Time    04/13/13  5:40 PM      Result Value Range Status   Specimen Description BRONCHIAL ALVEOLAR LAVAGE   Final   Special Requests Immunocompromised   Final   Fungal Smear     Final   Value: RARE YEAST     Performed at [facility]   Culture     Final   Value: CANDIDA ALBICANS     Performed at [facility]   Report Status PENDING   Incomplete  LEGIONELLA CULTURE     Status: None   Collection Time    04/13/13  5:40 PM      Result Value Range Status   Specimen Description BRONCHIAL ALVEOLAR LAVAGE   Final   Special Requests Immunocompromised   Final   Culture     Final   Value: NO LEGIONELLA ISOLATED     Performed at [facility]   Report Status 04/19/2013 FINAL   Final  PNEUMOCYSTIS JIROVECI SMEAR BY DFA     Status: None   Collection Time    04/13/13  5:40 PM      Result Value Range Status   Specimen Source-PJSRC BRONCHIAL ALVEOLAR LAVAGE   Final   Pneumocystis jiroveci Ag NEGATIVE   Final   Comment: Performed at [facility]  RESPIRATORY VIRUS PANEL     Status: None   Collection Time    04/13/13  5:40 PM      Result Value Range Status   Source - RVPAN BRONCHIAL ALVEOLAR LAVAGE   Corrected   Comment: CORRECTED ON 08/19 AT 1841: PREVIOUSLY REPORTED AS BRONCHIAL ALVEOLAR LAVAGE   Respiratory Syncytial Virus A NOT DETECTED   Final   Respiratory Syncytial Virus B NOT DETECTED   Final   Influenza A NOT DETECTED   Final    Influenza B NOT DETECTED   Final   Parainfluenza 1 NOT DETECTED   Final   Parainfluenza 2 NOT DETECTED   Final   Parainfluenza 3 NOT DETECTED   Final   Metapneumovirus NOT DETECTED   Final   Rhinovirus NOT DETECTED   Final   Adenovirus NOT DETECTED   Final   Influenza A H1 NOT DETECTED   Final   Influenza A H3 NOT DETECTED   Final   Comment: (NOTE)           Normal Reference Range for each Analyte: NOT DETECTED     Testing performed using the Lumine[employer] xTAG Respiratory Viral Panel test     kit.     This test was developed and its performance characteristics determined     by [facility]. It has not been cleared or approved by the [place]     Food and Drug Administration. This test is used for clinical purposes.     It should not be regarded as investigational or for research. This     laboratory is certified under the Clinical Laboratory Improvement     Amendments of 1988 (CLIA) as qualified to perform high complexity     clinical laboratory testing.     Performed at [facility]  M. TUBERCULOSIS COMPLEX BY PCR     Status: None   Collection Time    04/13/13  5:40 PM      Result Value Range Status   M. tuberculosis, Direct Not detected  Not detected Final   Comment: (NOTE)     This test(s) was developed  and its performance characteristics      have been determined by [facility] [employer] [facility],      [place], [place]. Performance characteristics refer to the analytical      performance of the test.     Performed at [facility]   Source (MTBPCR) BRONCHIAL ALVEOLAR LAVAGE   Final  CULTURE, BAL-QUANTITATIVE     Status: None   Collection Time    04/13/13  5:40 PM      Result Value Range Status   Specimen Description BRONCHIAL ALVEOLAR LAVAGE   Final   Special Requests NONE   Final   Gram Stain     Final   Value: RARE WBC PRESENT, PREDOMINANTLY PMN     NO SQUAMOUS EPITHELIAL CELLS SEEN     NO ORGANISMS SEEN     Performed at [facility]     Final   Value: 50,000 COLONIES/ML     Performed at [facility]   Culture     Final   Value: CANDIDA ALBICANS     Performed at [facility]   Report Status 04/16/2013 FINAL   Final  CLOSTRIDIUM DIFFICILE BY PCR     Status: None   Collection Time    04/14/13  2:26 PM      Result Value Range Status   C difficile by pcr NEGATIVE  NEGATIVE Final  FUNGUS CULTURE W SMEAR     Status: None   Collection Time    04/17/13 10:35 AM      Result Value Range Status   Specimen Description TISSUE LUNG LEFT   Final   Special Requests CT CORE LEFT LUNG ABSCESS   Final   Fungal Smear     Final   Value: NO YEAST OR FUNGAL ELEMENTS SEEN     Performed at [facility]   Culture     Final   Value: CULTURE IN PROGRESS FOR FOUR WEEKS     Performed at [facility]   Report Status PENDING   Incomplete  TISSUE CULTURE     Status: None   Collection Time    04/17/13 10:35 AM      Result Value Range Status   Specimen Description TISSUE LUNG LEFT   Final   Special Requests CT CORE LEFT LUNG ABCESS   Final   Gram Stain     Final   Value: NO WBC SEEN     NO ORGANISMS SEEN     Performed at [facility]   Culture     Final   Value: NO GROWTH 3 DAYS     Performed at [facility]   Report Status 04/20/2013 FINAL   Final  AFB CULTURE WITH SMEAR     Status: None   Collection Time    04/17/13 10:36 AM      Result Value Range Status   Specimen Description TISSUE LUNG LEFT   Final   Special Requests CT CORE LEFT LUNG ABSCESS   Final   ACID FAST SMEAR     Final   Value: NO ACID FAST BACILLI SEEN     Performed at [facility]   Culture     Final   Value: CULTURE WILL BE EXAMINED FOR 6 WEEKS BEFORE ISSUING A FINAL REPORT     Performed at [facility]   Report Status PENDING   Incomplete     Studies: No results found.  Scheduled Meds: .  amLODipine  10 mg Oral Daily  . benzonatate  100 mg Oral TID  . dextromethorphan  30 mg  Oral BID  . ethambutol  1,200 mg Oral Daily  . fluconazole  100 mg Oral Daily  . isoniazid  300 mg Oral Daily  . meropenem (MERREM) IV  1 g Intravenous Q8H  . pyrazinamide  1,500 mg Oral Daily  . vitamin B-6  50 mg Oral Daily  . rifabutin  300 mg Oral Daily  . sodium chloride  3 mL Intravenous Q12H  . sulfamethoxazole-trimethoprim  1 tablet Oral Daily  . vancomycin  1,000 mg Intravenous Q12H   Continuous Infusions:     Time spent: 25 minutes    Monique [name]  [facility] Pager [phone] If 7PM-7AM, please contact night-coverage at [email or web address], password [facility] 04/21/2013, 4:21 PM  LOS: 12 days

## 2013-04-21 NOTE — Progress Notes (Signed)
INFECTIOUS DISEASE ATTENDING ADDENDUM:     [facility]   Date: 04/21/2013  Patient name: Monique [name]  Medical record number: [id number]  Date of birth: 1971-03-09    This patient has been seen and discussed with the house staff. Please see their note for complete details. I concur with their findings with the following additions/corrections:  See separate note  [name] 04/21/2013, 1:43 PM

## 2013-04-21 NOTE — Consult Note (Signed)
Reason for Consult: Left upper lobe abscess Referring Physician: Dr. Jonetta Osgood is an 42 y.o. female.  HPI: 42 year old woman who presented with a chief complaint of left-sided chest pain, fevers, and shortness of breath.  Ms Doby is a 42 year old woman newly diagnosed with HIV who presented on 04/09/2013 with several days of fevers and chills, shortness of breath and left-sided chest pain. A chest x-ray showed a left upper lobe opacity in the CT scan showed pneumonia with the beginnings of an abscess. She was started on broad-spectrum antibiotics. She continued to spike fevers and eventually had bronchoscopy with bronchoalveolar lavage and a needle biopsy. Cultures from the bronchoalveolar lavage grew Candida, which was thought to be a colonization. Pathology from the needle biopsy showed only inflammation with no granulomas or malignancy.  Since admission she says that she feels much better. She has continued to spike fevers every evening. But she does not get the shaking chills that she was having prior to admission or early during her admission anymore. Her white count has been steadily decreasing on her current antibiotic regimen. Her cough is much improved, although she did cough up a small amount of blood-tinged sputum this morning.   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    Past Medical History  Diagnosis Date  . Tonsillitis   . Hypertension     Past Surgical History  Procedure Laterality Date  . Cesarean section    . Video bronchoscopy Bilateral 04/13/2013    Procedure: VIDEO BRONCHOSCOPY WITHOUT FLUORO;  Surgeon: Kalman Shan, MD;  Location: [facility] ENDOSCOPY;  Service: Cardiopulmonary;  Laterality: Bilateral;    No family history on file.  Social History:  reports that she has never smoked. She does not have any smokeless tobacco history on file. She reports that she does not drink alcohol  or use illicit drugs.  Allergies: No Known Allergies  Medications:  Scheduled: . amLODipine  10 mg Oral Daily  . benzonatate  100 mg Oral TID  . dextromethorphan  30 mg Oral BID  . ethambutol  1,200 mg Oral Daily  . fluconazole  100 mg Oral Daily  . isoniazid  300 mg Oral Daily  . meropenem (MERREM) IV  1 g Intravenous Q8H  . pyrazinamide  1,500 mg Oral Daily  . vitamin B-6  50 mg Oral Daily  . rifabutin  300 mg Oral Daily  . sodium chloride  3 mL Intravenous Q12H  . sulfamethoxazole-trimethoprim  1 tablet Oral Daily  . vancomycin  1,000 mg Intravenous Q12H    Results for orders placed during the hospital encounter of 04/09/13 (from the past 48 hour(s))  HEPATIC FUNCTION PANEL     Status: Abnormal   Collection Time    04/20/13 11:20 AM      Result Value Range   Total Protein 7.8  6.0 - 8.3 g/dL   Albumin 2.0 (*) 3.5 - 5.2 g/dL   AST 161 (*) 0 - 37 U/L   ALT 97 (*) 0 - 35 U/L   Alkaline Phosphatase 81  39 - 117 U/L   Total Bilirubin 0.2 (*) 0.3 - 1.2 mg/dL   Bilirubin, Direct <0.9  0.0 - 0.3 mg/dL   Indirect Bilirubin NOT CALCULATED  0.3 - 0.9 mg/dL  CBC     Status: Abnormal   Collection Time    04/21/13  4:30 AM      Result Value Range  WBC 9.9  4.0 - 10.5 K/uL   RBC 2.94 (*) 3.87 - 5.11 MIL/uL   Hemoglobin 8.5 (*) 12.0 - 15.0 g/dL   HCT 16.1 (*) 09.6 - 04.5 %   MCV 83.7  78.0 - 100.0 fL   MCH 28.9  26.0 - 34.0 pg   MCHC 34.6  30.0 - 36.0 g/dL   RDW 40.9  81.1 - 91.4 %   Platelets 390  150 - 400 K/uL  BASIC METABOLIC PANEL     Status: Abnormal   Collection Time    04/21/13  4:30 AM      Result Value Range   Sodium 135  135 - 145 mEq/L   Potassium 4.2  3.5 - 5.1 mEq/L   Chloride 105  96 - 112 mEq/L   CO2 22  19 - 32 mEq/L   Glucose, Bld 86  70 - 99 mg/dL   BUN 8  6 - 23 mg/dL   Creatinine, Ser 7.82  0.50 - 1.10 mg/dL   Calcium 8.7  8.4 - 95.6 mg/dL   GFR calc non Af Amer 63 (*) >90 mL/min   GFR calc Af Amer 73 (*) >90 mL/min   Comment: (NOTE)     The eGFR  has been calculated using the CKD EPI equation.     This calculation has not been validated in all clinical situations.     eGFR's persistently <90 mL/min signify possible Chronic Kidney     Disease.    No results found.  Review of Systems  Constitutional: Positive for fever, chills and malaise/fatigue.  Respiratory: Positive for cough, hemoptysis, sputum production and shortness of breath.   Cardiovascular: Positive for chest pain.  Neurological: Positive for weakness.  All other systems reviewed and are negative.   Blood pressure 113/62, pulse 103, temperature 99.3 F (37.4 C), temperature source Oral, resp. rate 20, height 5\' 2"  (1.575 m), weight 204 lb (92.534 kg), last menstrual period 03/09/2013, SpO2 100.00%. Physical Exam  Vitals reviewed. Constitutional: She is oriented to person, place, and time. She appears well-developed and well-nourished.  HENT:  Head: Normocephalic and atraumatic.  Eyes: EOM are normal. Pupils are equal, round, and reactive to light.  Neck: Neck supple. No thyromegaly present.  Cardiovascular: Normal rate, regular rhythm and normal heart sounds.  Exam reveals no friction rub.   No murmur heard. Respiratory: Effort normal. No respiratory distress. She has no wheezes. She has no rales.  Diminished BS on left  Musculoskeletal: She exhibits no edema.  Lymphadenopathy:    She has no cervical adenopathy.  Neurological: She is alert and oriented to person, place, and time. No cranial nerve deficit.  Skin: Skin is warm and dry.   *RADIOLOGY REPORT* 04/09/13 Clinical Data: Left upper chest pain and shortness of breath.  Nonproductive cough and fever.  CT ANGIOGRAPHY CHEST  Technique: Multidetector CT imaging of the chest using the  standard protocol during bolus administration of intravenous  contrast. Multiplanar reconstructed images including MIPs were  obtained and reviewed to evaluate the vascular anatomy.  Contrast: OMNIPAQUE IOHEXOL 350  MG/ML SOLN  Comparison: Chest radiograph 04/09/2013.  Findings: No pulmonary embolus. Mediastinal lymph nodes measure up  to 9 mm in the low right paratracheal station. Axillary lymph  nodes measure up to 13 mm on the left. Heart is at the upper  limits of normal in size to mildly enlarged. No pericardial  effusion.  Trace left pleural fluid. Large area of airspace consolidation and  surrounding inflammatory change  in the left upper lobe with  internal low attenuation and air. Airway remains grossly patent.  Minimal scattered peribronchovascular nodularity in the right lung.  Incidental imaging of the upper abdomen shows no acute findings.  No worrisome lytic or sclerotic lesions.  IMPRESSION:  1. Negative for pulmonary embolus.  2. Consolidative findings in the left upper lobe are most  consistent with pneumonia and associated central abscess. Follow-up  to clearing is recommended as malignancy cannot be definitively  excluded. These results were called by telephone on 04/09/2013 at  1200 hours to Dr. Rubin Payor, who verbally acknowledged these  results.  3. Mediastinal and axillary lymph nodes may be reactive.  4. Trace left pleural fluid.  Original Report Authenticated By: Leanna Battles, M.D.  *RADIOLOGY REPORT* 04/17/13 Clinical data: Enlarging cavitary left upper lobe lung lesion.  Bronchoscopic biopsies unrevealing.  CT-GUIDED LUNG CORE BIOPSY  Technique and findings: The procedure, risks (including but not  limited to bleeding, infection, organ damage, pneumothorax, chest  tube), benefits, and alternatives were explained to the patient.  Questions regarding the procedure were encouraged and answered.  The patient understands and consents to the procedure.Select axial  scans through the thorax were obtained. The process was localized  and an appropriate skin entry site determined. Operator donned  sterile gloves and mask. Site was marked, prepped with Betadine,  draped in  usual sterile fashion, infiltrated locally with 1%  lidocaine.  Intravenous Fentanyl and Versed were administered as conscious  sedation during continuous cardiorespiratory monitoring by the  radiology RN, with a total moderate sedation time of 20 minutes.  Under CT fluoroscopic guidance, a 17 gauge trocar needle was  advanced to the peripheral margin of the lesion. Once needle tip  position was confirmed, coaxial 18-gauge core biopsy samples were  obtained, submitted in formalin and saline to surgical pathology  and microbiology respectively for the requested laboratory studies.  The guide needle was removed. Post scans show no significant  hemorrhage or pneumothorax. The patient tolerated the procedure  well. No immediate complication.  IMPRESSION:  1. Technically successful CT-guided core biopsy of left upper lobe  cavitary lung mass.  Original Report Authenticated By: D. Andria Rhein, MD  *RADIOLOGY REPORT* 04/18/13 Clinical Data: Tiny left pneumothorax, follow-up. Left lung  infiltrate, follow up.  CHEST - 2 VIEW  Comparison: 04/17/2013  Findings: Extensive consolidation throughout most of the left lung  is unchanged. The tiny apical pneumothorax seen on the previous  day's study is not appreciated on the current exam.  The right lung remains clear. Cardiac silhouette is normal in  size. No mediastinal or masses are noted.  IMPRESSION:  The tiny left apical pneumothorax seen on the previous day's study  is not appreciated on the current exam.  Persistent extensive left lung consolidation is stable.  Original Report Authenticated By: Amie Portland, M.D.   Assessment/Plan: 42 year old woman with a severe necrotizing pneumonia with central cavitation and abscess formation. She is being treated aggressively with antibiotics. She has improved dramatically clinically with the exception of continuing to spike fevers to approximate 102 every evening. Despite continued fever she has had  an normalization of her white blood cell count.  I don't see a role for surgical intervention at this point in time. I suspect that her continued fevers are due to the burden of necrotic lung from her severe pneumonia. She does not had any evidence of parapneumonic effusion or empyema. Resection in this setting is fraught with hazard. I would strongly favor continued antibiotics,  which will likely need to continue as an outpatient for quite some period of time.  I will be happy to follow her with you.  Wenzel Backlund C 04/21/2013, 3:48 PM

## 2013-04-21 NOTE — Progress Notes (Signed)
[facility]    Subjective: Patient states she is feeling well. She says she did not feel "feverish" last night and did not have any sweats or chills. However, she was shown to have a Tmax of 102.6 approximately 2100 and given Tylenol with defervescence. Otherwise she has no new complaints.  Denies N/V/D, constipation, dysuria, or SOB.   Antibiotics:  Anti-infectives   Start     Dose/Rate Route Frequency Ordered Stop   04/21/13 1000  ethambutol (MYAMBUTOL) tablet 1,200 mg     1,200 mg Oral Daily 04/20/13 1352     04/21/13 1000  pyrazinamide tablet 1,500 mg     1,500 mg Oral Daily 04/20/13 1352     04/20/13 1300  fluconazole (DIFLUCAN) tablet 100 mg     100 mg Oral Daily 04/20/13 1210     04/17/13 2200  linezolid (ZYVOX) tablet 600 mg     600 mg Oral Every 12 hours 04/17/13 1849     04/15/13 1600  rifabutin (MYCOBUTIN) capsule 300 mg     300 mg Oral Daily 04/15/13 1454     04/15/13 1000  ethambutol (MYAMBUTOL) tablet 1,400 mg  Status:  Discontinued     15 mg/kg  92.5 kg Oral Daily 04/15/13 0801 04/20/13 1352   04/15/13 1000  rifampin (RIFADIN) capsule 600 mg  Status:  Discontinued     600 mg Oral Daily 04/15/13 0801 04/15/13 1454   04/15/13 1000  pyrazinamide tablet 2,000 mg  Status:  Discontinued     2,000 mg Oral Daily 04/15/13 0801 04/20/13 1352   04/15/13 1000  isoniazid (NYDRAZID) tablet 300 mg     300 mg Oral Daily 04/15/13 0801     04/15/13 1000  moxifloxacin (AVELOX) tablet 400 mg     400 mg Oral Daily 04/15/13 0801     04/13/13 1845  sulfamethoxazole-trimethoprim (BACTRIM DS) 800-160 MG per tablet 1 tablet     1 tablet Oral Daily 04/13/13 1813     04/11/13 2000  Ampicillin-Sulbactam (UNASYN) 3 g in sodium chloride 0.9 % 100 mL IVPB  Status:  Discontinued     3 g 100 mL/hr over 60 Minutes Intravenous Every 6 hours 04/11/13 1439 04/15/13 1007   04/11/13 1500  vancomycin (VANCOCIN) IVPB 1000 mg/200 mL premix  Status:  Discontinued     1,000  mg 200 mL/hr over 60 Minutes Intravenous Every 12 hours 04/11/13 1441 04/13/13 0935   04/10/13 1400  vancomycin (VANCOCIN) IVPB 1000 mg/200 mL premix  Status:  Discontinued     1,000 mg 200 mL/hr over 60 Minutes Intravenous Every 24 hours 04/09/13 1323 04/11/13 1441   04/09/13 2000  Ampicillin-Sulbactam (UNASYN) 3 g in sodium chloride 0.9 % 100 mL IVPB  Status:  Discontinued     3 g 100 mL/hr over 60 Minutes Intravenous Every 8 hours 04/09/13 1328 04/11/13 1439   04/09/13 1330  vancomycin (VANCOCIN) 1,250 mg in sodium chloride 0.9 % 250 mL IVPB     1,250 mg 166.7 mL/hr over 90 Minutes Intravenous STAT 04/09/13 1322 04/09/13 1538   04/09/13 1315  clindamycin (CLEOCIN) IVPB 600 mg     600 mg 100 mL/hr over 30 Minutes Intravenous  Once 04/09/13 1302 04/09/13 1359   04/09/13 0900  cefTRIAXone (ROCEPHIN) 1 g in dextrose 5 % 50 mL IVPB     1 g 100 mL/hr over 30 Minutes Intravenous  Once 04/09/13 0850 04/09/13 1042   04/09/13 0900  azithromycin (ZITHROMAX) 500 mg in dextrose  5 % 250 mL IVPB     500 mg 250 mL/hr over 60 Minutes Intravenous  Once 04/09/13 0850 04/09/13 1227      Medications: Scheduled Meds: . amLODipine  10 mg Oral Daily  . benzonatate  100 mg Oral TID  . dextromethorphan  30 mg Oral BID  . ethambutol  1,200 mg Oral Daily  . fluconazole  100 mg Oral Daily  . isoniazid  300 mg Oral Daily  . linezolid  600 mg Oral Q12H  . moxifloxacin  400 mg Oral Daily  . pyrazinamide  1,500 mg Oral Daily  . vitamin B-6  50 mg Oral Daily  . rifabutin  300 mg Oral Daily  . sodium chloride  3 mL Intravenous Q12H  . sulfamethoxazole-trimethoprim  1 tablet Oral Daily   Continuous Infusions:  PRN Meds:.acetaminophen, acetaminophen, chlorpheniramine-HYDROcodone, HYDROcodone-acetaminophen, oxyCODONE   Objective: Weight change:   Intake/Output Summary (Last 24 hours) at 04/21/13 0849 Last data filed at 04/20/13 1330  Gross per 24 hour  Intake    693 ml  Output      0 ml  Net    693  ml   Blood pressure 120/76, pulse 96, temperature 99.5 F (37.5 C), temperature source Oral, resp. rate 24, height 5\' 2"  (1.575 m), weight 92.534 kg (204 lb), last menstrual period 03/09/2013, SpO2 100.00%. Temp:  [98.6 F (37 C)-102.6 F (39.2 C)] 99.5 F (37.5 C) (08/26 0528) Pulse Rate:  [96-117] 96 (08/26 0528) Resp:  [20-24] 24 (08/26 0528) BP: (120-144)/(76-87) 120/76 mmHg (08/26 0528) SpO2:  [96 %-100 %] 100 % (08/26 0528)  Physical Exam: General: Alert and awake, oriented x3, not in any acute distress. HEENT: anicteric sclera, pupils reactive to light and accommodation, EOMI, minor oral thrush, improving CVS regular rate, normal r,  no murmur rubs or gallops Chest: improving but decreased breath sounds LUL, coarse breath sounds throughout remaining left lung, R CTA Abdomen: soft nontender, nondistended, normal bowel sounds Extremities: trace edema B/L LE Skin: no rashes Lymph: no new lymphadenopathy Neuro: nonfocal  Lab Results:  Recent Labs  04/18/13 1515 04/21/13 0430  WBC 13.0* 9.9  HGB 9.1* 8.5*  HCT 26.1* 24.6*  PLT 448* 390    BMET  Recent Labs  04/18/13 1515 04/21/13 0430  NA 132* 135  K 4.1 4.2  CL 101 105  CO2 21 22  GLUCOSE 100* 86  BUN 8 8  CREATININE 1.11* 1.07  CALCIUM 8.7 8.7    Micro Results: Recent Results (from the past 240 hour(s))  AFB CULTURE WITH SMEAR     Status: None   Collection Time    04/11/13 11:42 AM      Result Value Range Status   Specimen Description SPUTUM   Final   Special Requests Immunocompromised   Final   ACID FAST SMEAR     Final   Value: NO ACID FAST BACILLI SEEN     Performed at [facility]   Culture     Final   Value: CULTURE WILL BE EXAMINED FOR 6 WEEKS BEFORE ISSUING A FINAL REPORT     Performed at [facility]   Report Status PENDING   Incomplete  AFB CULTURE WITH SMEAR     Status: None   Collection Time    04/12/13  3:04 PM      Result Value Range Status   Specimen Description  NASOPHARYNGEAL   Final   Special Requests Immunocompromised   Final   ACID FAST SMEAR  Final   Value: NO ACID FAST BACILLI SEEN     Performed at [facility]   Culture     Final   Value: CULTURE WILL BE EXAMINED FOR 6 WEEKS BEFORE ISSUING A FINAL REPORT     Performed at [facility]   Report Status PENDING   Incomplete  AFB CULTURE WITH SMEAR     Status: None   Collection Time    04/13/13  5:40 PM      Result Value Range Status   Specimen Description BRONCHIAL ALVEOLAR LAVAGE   Final   Special Requests Immunocompromised   Final   ACID FAST SMEAR     Final   Value: NO ACID FAST BACILLI SEEN     Performed at [facility]   Culture     Final   Value: CULTURE WILL BE EXAMINED FOR 6 WEEKS BEFORE ISSUING A FINAL REPORT     Performed at [facility]   Report Status PENDING   Incomplete  FUNGUS CULTURE W SMEAR     Status: None   Collection Time    04/13/13  5:40 PM      Result Value Range Status   Specimen Description BRONCHIAL ALVEOLAR LAVAGE   Final   Special Requests Immunocompromised   Final   Fungal Smear     Final   Value: RARE YEAST     Performed at [facility]   Culture     Final   Value: CANDIDA ALBICANS     Performed at [facility]   Report Status PENDING   Incomplete  LEGIONELLA CULTURE     Status: None   Collection Time    04/13/13  5:40 PM      Result Value Range Status   Specimen Description BRONCHIAL ALVEOLAR LAVAGE   Final   Special Requests Immunocompromised   Final   Culture     Final   Value: NO LEGIONELLA ISOLATED     Performed at [facility]   Report Status 04/19/2013 FINAL   Final  PNEUMOCYSTIS JIROVECI SMEAR BY DFA     Status: None   Collection Time    04/13/13  5:40 PM      Result Value Range Status   Specimen Source-PJSRC BRONCHIAL ALVEOLAR LAVAGE   Final   Pneumocystis jiroveci Ag NEGATIVE   Final   Comment: Performed at [facility]  RESPIRATORY VIRUS PANEL     Status:  None   Collection Time    04/13/13  5:40 PM      Result Value Range Status   Source - RVPAN BRONCHIAL ALVEOLAR LAVAGE   Corrected   Comment: CORRECTED ON 08/19 AT 1841: PREVIOUSLY REPORTED AS BRONCHIAL ALVEOLAR LAVAGE   Respiratory Syncytial Virus A NOT DETECTED   Final   Respiratory Syncytial Virus B NOT DETECTED   Final   Influenza A NOT DETECTED   Final   Influenza B NOT DETECTED   Final   Parainfluenza 1 NOT DETECTED   Final   Parainfluenza 2 NOT DETECTED   Final   Parainfluenza 3 NOT DETECTED   Final   Metapneumovirus NOT DETECTED   Final   Rhinovirus NOT DETECTED   Final   Adenovirus NOT DETECTED   Final   Influenza A H1 NOT DETECTED   Final   Influenza A H3 NOT DETECTED   Final   Comment: (NOTE)           Normal Reference  Range for each Analyte: NOT DETECTED     Testing performed using the Lum[employer] xTAG Respiratory Viral Panel test     kit.     This test was developed and its performance characteristics determined     by [facility]. It has not been cleared or approved by the [place]     Food and Drug Administration. This test is used for clinical purposes.     It should not be regarded as investigational or for research. This     laboratory is certified under the Clinical Laboratory Improvement     Amendments of 1988 (CLIA) as qualified to perform high complexity     clinical laboratory testing.     Performed at [facility]  M. TUBERCULOSIS COMPLEX BY PCR     Status: None   Collection Time    04/13/13  5:40 PM      Result Value Range Status   M. tuberculosis, Direct Not detected  Not detected Final   Comment: (NOTE)     This test(s) was developed and its performance characteristics      have been determined by [employer],      [place]. Performance characteristics refer to the analytical      performance of the test.     Performed at [facility]   Source (MTBPCR) BRONCHIAL ALVEOLAR LAVAGE   Final  CULTURE,  BAL-QUANTITATIVE     Status: None   Collection Time    04/13/13  5:40 PM      Result Value Range Status   Specimen Description BRONCHIAL ALVEOLAR LAVAGE   Final   Special Requests NONE   Final   Gram Stain     Final   Value: RARE WBC PRESENT, PREDOMINANTLY PMN     NO SQUAMOUS EPITHELIAL CELLS SEEN     NO ORGANISMS SEEN     Performed at [facility] Count     Final   Value: 50,000 COLONIES/ML     Performed at [facility]   Culture     Final   Value: CANDIDA ALBICANS     Performed at [facility]   Report Status 04/16/2013 FINAL   Final  CLOSTRIDIUM DIFFICILE BY PCR     Status: None   Collection Time    04/14/13  2:26 PM      Result Value Range Status   C difficile by pcr NEGATIVE  NEGATIVE Final  FUNGUS CULTURE W SMEAR     Status: None   Collection Time    04/17/13 10:35 AM      Result Value Range Status   Specimen Description TISSUE LUNG LEFT   Final   Special Requests CT CORE LEFT LUNG ABSCESS   Final   Fungal Smear     Final   Value: NO YEAST OR FUNGAL ELEMENTS SEEN     Performed at [facility]   Culture     Final   Value: CULTURE IN PROGRESS FOR FOUR WEEKS     Performed at [facility]   Report Status PENDING   Incomplete  TISSUE CULTURE     Status: None   Collection Time    04/17/13 10:35 AM      Result Value Range Status   Specimen Description TISSUE LUNG LEFT   Final   Special Requests CT CORE LEFT LUNG ABCESS   Final   Gram Stain     Final  Value: NO WBC SEEN     NO ORGANISMS SEEN     Performed at [facility]   Culture     Final   Value: NO GROWTH 3 DAYS     Performed at [facility]   Report Status 04/20/2013 FINAL   Final  AFB CULTURE WITH SMEAR     Status: None   Collection Time    04/17/13 10:36 AM      Result Value Range Status   Specimen Description TISSUE LUNG LEFT   Final   Special Requests CT CORE LEFT LUNG ABSCESS   Final   ACID FAST SMEAR     Final   Value: NO ACID FAST  BACILLI SEEN     Performed at [facility]   Culture     Final   Value: CULTURE WILL BE EXAMINED FOR 6 WEEKS BEFORE ISSUING A FINAL REPORT     Performed at [facility]   Report Status PENDING   Incomplete    Studies/Results: No results found.    Assessment/Plan: Monique [name] is a 42 y.o. female with HTN who presented with fevers/chills and left sided substernal chest pain TTP. Chest CT consistent with pneumonia and LUL abscess with need for f/u to confirm clearance and exclude any malignancy. 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  She underwent CT guided biopsy of her left lung on 04/17/13. Sent for path and culture which remain negative to date.   Anti-infectives:  Bactrim (8/18>>  Avelox (8/20>>  RIPE (8/20>>  Zyvox (8/22>>  Fluconazole (8/25>>   Pneumonia, with abscess:  --CT Biopsy NTD  --continue avelox for strep + anerobe coverage  --continue zyvox for broadened coverage --blood cx, NGTD  --BAL with C. Albicans, minor thrush on tongue and is on fluconazole --Respiratory virus panel, negative   HIV: Confirmatory testing with Western Blot, POSITIVE. Wishes for NO INFORMATION TO BE DISCUSSED WITH FAMILY PRESENT  --CD4 = 70, VL = 73,711  --HLA [id number], negative  --HIV genotype, No relevant mutations noted  --With treatment of pulmonary TB will need to wait 2 weeks (04/29/13 minimum) prior to initiation of HAART to decrease risk of IRIS  --continue Bactrim for PCP ppx  --oral thrush, on fluconazole   Rule out TB:  --no longer on airborne precautions  --Quantiferon Gold POSITIVE  --AFB sputum x 3, negative; BAL: Negative  --continue rifabutin, INH, B6, pyrazinamide, ethambutol     LOS: 12 days   Lewie Chamber 04/21/2013, 8:49 AM

## 2013-04-21 NOTE — Progress Notes (Signed)
ANTIBIOTIC CONSULT NOTE - INITIAL  Pharmacy Consult for vancomycin, meropenem Indication: lung abscess  No Known Allergies  Patient Measurements: Height: 5\' 2"  (157.5 cm) Weight: 204 lb (92.534 kg) IBW/kg (Calculated) : 50.1   Vital Signs: Temp: 99.5 F (37.5 C) (08/26 0528) Temp src: Oral (08/26 0528) BP: 120/76 mmHg (08/26 0528) Pulse Rate: 96 (08/26 0528) Intake/Output from previous day: 08/25 0701 - 08/26 0700 In: 693 [P.O.:690; I.V.:3] Out: -  Intake/Output from this shift:    Labs:  Recent Labs  04/18/13 1515 04/21/13 0430  WBC 13.0* 9.9  HGB 9.1* 8.5*  PLT 448* 390  CREATININE 1.11* 1.07   Estimated Creatinine Clearance: 72.6 ml/min (by C-G formula based on Cr of 1.07). No results found for this basename: VANCOTROUGH, Leodis Binet, VANCORANDOM, GENTTROUGH, GENTPEAK, GENTRANDOM, TOBRATROUGH, TOBRAPEAK, TOBRARND, AMIKACINPEAK, AMIKACINTROU, AMIKACIN,  in the last 72 hours   Microbiology: Recent Results (from the past 720 hour(s))  CULTURE, BLOOD (ROUTINE X 2)     Status: None   Collection Time    04/09/13  8:55 AM      Result Value Range Status   Specimen Description BLOOD RIGHT ANTECUBITAL   Final   Special Requests BOTTLES DRAWN AEROBIC ONLY   Final   Culture  Setup Time     Final   Value: 04/09/2013 17:08     Performed at [facility]   Culture     Final   Value: NO GROWTH 5 DAYS     Performed at [facility]   Report Status 04/15/2013 FINAL   Final  CULTURE, BLOOD (ROUTINE X 2)     Status: None   Collection Time    04/09/13  9:02 AM      Result Value Range Status   Specimen Description BLOOD ARM RIGHT   Final   Special Requests BOTTLES DRAWN AEROBIC ONLY   Final   Culture  Setup Time     Final   Value: 04/09/2013 17:07     Performed at [facility]   Culture     Final   Value: NO GROWTH 5 DAYS     Performed at [facility]   Report Status 04/15/2013 FINAL   Final  MRSA PCR SCREENING     Status: None   Collection Time    04/09/13  3:05 PM      Result Value Range Status   MRSA by PCR NEGATIVE  NEGATIVE Final   Comment:            The GeneXpert MRSA Assay (FDA     approved for NASAL specimens     only), is one component of a     comprehensive MRSA colonization     surveillance program. It is not     intended to diagnose MRSA     infection nor to guide or     monitor treatment for     MRSA infections.  CULTURE, EXPECTORATED SPUTUM-ASSESSMENT     Status: None   Collection Time    04/09/13  5:18 PM      Result Value Range Status   Specimen Description SPUTUM   Final   Special Requests Normal   Final   Sputum evaluation     Final   Value: MICROSCOPIC FINDINGS SUGGEST THAT THIS SPECIMEN IS NOT REPRESENTATIVE OF LOWER RESPIRATORY SECRETIONS. PLEASE RECOLLECT.     CALLED TO RN S.MERLINI AT 0053 04/10/13 BY L.PITT   Report Status 04/10/2013 FINAL   Final  CULTURE, EXPECTORATED SPUTUM-ASSESSMENT     Status: None   Collection Time    04/10/13  3:04 AM      Result Value Range Status   Specimen Description SPUTUM   Final   Special Requests NONE   Final   Sputum evaluation     Final   Value: MICROSCOPIC FINDINGS SUGGEST THAT THIS SPECIMEN IS NOT REPRESENTATIVE OF LOWER RESPIRATORY SECRETIONS. PLEASE RECOLLECT.     CALLED TO MERLINI,S RN 04/10/2013 0336 JORDANS   Report Status 04/10/2013 FINAL   Final  AFB CULTURE WITH SMEAR     Status: None   Collection Time    04/10/13 11:00 AM      Result Value Range Status   Specimen Description SPUTUM   Final   Special Requests NONE   Final   ACID FAST SMEAR     Final   Value: NO ACID FAST BACILLI SEEN     Performed at [facility]   Culture     Final   Value: CULTURE WILL BE EXAMINED FOR 6 WEEKS BEFORE ISSUING A FINAL REPORT     Performed at [facility]   Report Status PENDING   Incomplete  AFB CULTURE WITH SMEAR     Status: None   Collection Time    04/11/13 11:42 AM      Result Value Range Status   Specimen Description  SPUTUM   Final   Special Requests Immunocompromised   Final   ACID FAST SMEAR     Final   Value: NO ACID FAST BACILLI SEEN     Performed at [facility]   Culture     Final   Value: CULTURE WILL BE EXAMINED FOR 6 WEEKS BEFORE ISSUING A FINAL REPORT     Performed at [facility]   Report Status PENDING   Incomplete  AFB CULTURE WITH SMEAR     Status: None   Collection Time    04/12/13  3:04 PM      Result Value Range Status   Specimen Description NASOPHARYNGEAL   Final   Special Requests Immunocompromised   Final   ACID FAST SMEAR     Final   Value: NO ACID FAST BACILLI SEEN     Performed at [facility]   Culture     Final   Value: CULTURE WILL BE EXAMINED FOR 6 WEEKS BEFORE ISSUING A FINAL REPORT     Performed at [facility]   Report Status PENDING   Incomplete  AFB CULTURE WITH SMEAR     Status: None   Collection Time    04/13/13  5:40 PM      Result Value Range Status   Specimen Description BRONCHIAL ALVEOLAR LAVAGE   Final   Special Requests Immunocompromised   Final   ACID FAST SMEAR     Final   Value: NO ACID FAST BACILLI SEEN     Performed at [facility]   Culture     Final   Value: CULTURE WILL BE EXAMINED FOR 6 WEEKS BEFORE ISSUING A FINAL REPORT     Performed at [facility]   Report Status PENDING   Incomplete  FUNGUS CULTURE W SMEAR     Status: None   Collection Time    04/13/13  5:40 PM      Result Value Range Status   Specimen Description BRONCHIAL ALVEOLAR LAVAGE   Final   Special Requests Immunocompromised   Final  Fungal Smear     Final   Value: RARE YEAST     Performed at [facility]   Culture     Final   Value: CANDIDA ALBICANS     Performed at [facility]   Report Status PENDING   Incomplete  LEGIONELLA CULTURE     Status: None   Collection Time    04/13/13  5:40 PM      Result Value Range Status   Specimen Description BRONCHIAL ALVEOLAR LAVAGE   Final   Special Requests  Immunocompromised   Final   Culture     Final   Value: NO LEGIONELLA ISOLATED     Performed at [facility]   Report Status 04/19/2013 FINAL   Final  PNEUMOCYSTIS JIROVECI SMEAR BY DFA     Status: None   Collection Time    04/13/13  5:40 PM      Result Value Range Status   Specimen Source-PJSRC BRONCHIAL ALVEOLAR LAVAGE   Final   Pneumocystis jiroveci Ag NEGATIVE   Final   Comment: Performed at [facility]  RESPIRATORY VIRUS PANEL     Status: None   Collection Time    04/13/13  5:40 PM      Result Value Range Status   Source - RVPAN BRONCHIAL ALVEOLAR LAVAGE   Corrected   Comment: CORRECTED ON 08/19 AT 1841: PREVIOUSLY REPORTED AS BRONCHIAL ALVEOLAR LAVAGE   Respiratory Syncytial Virus A NOT DETECTED   Final   Respiratory Syncytial Virus B NOT DETECTED   Final   Influenza A NOT DETECTED   Final   Influenza B NOT DETECTED   Final   Parainfluenza 1 NOT DETECTED   Final   Parainfluenza 2 NOT DETECTED   Final   Parainfluenza 3 NOT DETECTED   Final   Metapneumovirus NOT DETECTED   Final   Rhinovirus NOT DETECTED   Final   Adenovirus NOT DETECTED   Final   Influenza A H1 NOT DETECTED   Final   Influenza A H3 NOT DETECTED   Final   Comment: (NOTE)           Normal Reference Range for each Analyte: NOT DETECTED     Testing performed using the Luminex xTAG Respiratory Viral Panel test     kit.     This test was developed and its performance characteristics determined     by [facility]. It has not been cleared or approved by the [place]     Food and Drug Administration. This test is used for clinical purposes.     It should not be regarded as investigational or for research. This     laboratory is certified under the Clinical Laboratory Improvement     Amendments of 1988 (CLIA) as qualified to perform high complexity     clinical laboratory testing.     Performed at [facility]  M. TUBERCULOSIS COMPLEX BY PCR     Status: None   Collection Time     04/13/13  5:40 PM      Result Value Range Status   M. tuberculosis, Direct Not detected  Not detected Final   Comment: (NOTE)     This test(s) was developed and its performance characteristics      have been determined by [employer],      [place], [place]. Performance characteristics refer to the analytical      performance of the test.  Performed at [facility], [place].   Source (MTBPCR) BRONCHIAL ALVEOLAR LAVAGE   Final  CULTURE, BAL-QUANTITATIVE     Status: None   Collection Time    04/13/13  5:40 PM      Result Value Range Status   Specimen Description BRONCHIAL ALVEOLAR LAVAGE   Final   Special Requests NONE   Final   Gram Stain     Final   Value: RARE WBC PRESENT, PREDOMINANTLY PMN     NO SQUAMOUS EPITHELIAL CELLS SEEN     NO ORGANISMS SEEN     Performed at [facility] Count     Final   Value: 50,000 COLONIES/ML     Performed at [facility]   Culture     Final   Value: CANDIDA ALBICANS     Performed at [facility]   Report Status 04/16/2013 FINAL   Final  CLOSTRIDIUM DIFFICILE BY PCR     Status: None   Collection Time    04/14/13  2:26 PM      Result Value Range Status   C difficile by pcr NEGATIVE  NEGATIVE Final  FUNGUS CULTURE W SMEAR     Status: None   Collection Time    04/17/13 10:35 AM      Result Value Range Status   Specimen Description TISSUE LUNG LEFT   Final   Special Requests CT CORE LEFT LUNG ABSCESS   Final   Fungal Smear     Final   Value: NO YEAST OR FUNGAL ELEMENTS SEEN     Performed at [facility]   Culture     Final   Value: CULTURE IN PROGRESS FOR FOUR WEEKS     Performed at [facility]   Report Status PENDING   Incomplete  TISSUE CULTURE     Status: None   Collection Time    04/17/13 10:35 AM      Result Value Range Status   Specimen Description TISSUE LUNG LEFT   Final   Special Requests CT CORE LEFT LUNG ABCESS   Final   Gram Stain     Final    Value: NO WBC SEEN     NO ORGANISMS SEEN     Performed at [facility]   Culture     Final   Value: NO GROWTH 3 DAYS     Performed at [facility]   Report Status 04/20/2013 FINAL   Final  AFB CULTURE WITH SMEAR     Status: None   Collection Time    04/17/13 10:36 AM      Result Value Range Status   Specimen Description TISSUE LUNG LEFT   Final   Special Requests CT CORE LEFT LUNG ABSCESS   Final   ACID FAST SMEAR     Final   Value: NO ACID FAST BACILLI SEEN     Performed at [facility]   Culture     Final   Value: CULTURE WILL BE EXAMINED FOR 6 WEEKS BEFORE ISSUING A FINAL REPORT     Performed at [facility]   Report Status PENDING   Incomplete    Medical History: Past Medical History  Diagnosis Date  . Tonsillitis   . Hypertension     Medications:  Scheduled:  . amLODipine  10 mg Oral Daily  . benzonatate  100 mg Oral TID  . dextromethorphan  30 mg Oral BID  . ethambutol  1,200  mg Oral Daily  . fluconazole  100 mg Oral Daily  . isoniazid  300 mg Oral Daily  . pyrazinamide  1,500 mg Oral Daily  . vitamin B-6  50 mg Oral Daily  . rifabutin  300 mg Oral Daily  . sodium chloride  3 mL Intravenous Q12H  . sulfamethoxazole-trimethoprim  1 tablet Oral Daily   Assessment: 42 yo lady known to pharmacy from previous antibiotic dosing.  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 Cultures negative to date.  She did spike temp last night to 102.6>  Her SrCr is 10.7 and CrCl ~73 ml/min.  Previously was on vanc 1 gm IV q12 hours  Goal of Therapy:  Vancomycin trough level 15-20 mcg/ml  Plan:  Vancomycin 1gm IV q12 hours. Meropenem 1gm IV q8 hours. F/u renal function, cultures and clinical progress.  Sofija Antwi Poteet 04/21/2013,10:58 AM

## 2013-04-21 NOTE — Progress Notes (Signed)
INFECTIOUS DISEASE ATTENDING ADDENDUM:     [facility]   Date: 04/21/2013  Patient name: Monique [name]  Medical record number: [id number]  Date of birth: [birth year]    This patient has been seen and discussed with the house staff. Please see their note for complete details. I concur with their findings with the following additions/corrections:  Reviewed films with the pt and discussed case with the pt and with Dr. Gonzella Lex  I spent greater than 45 minutes with the patient including greater than 50% of time in face to face counsel of the patient and in coordination of their care.   I reviewed pathology report with pathologist, pt with necrotic tissue but no granulomas and no malignancy. Special stains pending  Greatly appreciate  CT surgery help here  I am going to broaden her abx coverage to include Pseudomonas though I would've thought this organism was grown on culture and my suspicion is more at that she simply needs either time or surgical intervention to clear her abscess. Therefore I am changing her to meropenem, discontinuing her Avelox.  I am discontinuing her Zyvox and change her to IV vancomycin.  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  I would start ARV meds when she is 2 weeks into TB therapy  [name] 04/21/2013, 12:31 PM

## 2013-04-22 DIAGNOSIS — A150 Tuberculosis of lung: Secondary | ICD-10-CM | POA: Diagnosis present

## 2013-04-22 MED ORDER — VANCOMYCIN HCL IN DEXTROSE 1-5 GM/200ML-% IV SOLN: 1000.0000 mg | Freq: Two times a day (BID) | INTRAVENOUS | Status: DC

## 2013-04-22 MED ORDER — ETHAMBUTOL HCL 400 MG PO TABS: 1200.0000 mg | ORAL_TABLET | Freq: Every day | ORAL | Status: DC

## 2013-04-22 MED ORDER — FLUCONAZOLE 100 MG PO TABS: 100.0000 mg | ORAL_TABLET | Freq: Every day | ORAL | Status: DC

## 2013-04-22 MED ORDER — SULFAMETHOXAZOLE-TMP DS 800-160 MG PO TABS: 1.0000 | ORAL_TABLET | Freq: Every day | ORAL | Status: DC

## 2013-04-22 MED ORDER — BENAZEPRIL-HYDROCHLOROTHIAZIDE 20-12.5 MG PO TABS: 1.0000 | ORAL_TABLET | Freq: Every day | ORAL | Status: DC

## 2013-04-22 MED ORDER — ISONIAZID 300 MG PO TABS: 300.0000 mg | ORAL_TABLET | Freq: Every day | ORAL | Status: DC

## 2013-04-22 MED ORDER — SODIUM CHLORIDE 0.9 % IV SOLN: 1.0000 g | Freq: Three times a day (TID) | INTRAVENOUS | Status: DC

## 2013-04-22 MED ORDER — OXYCODONE HCL 5 MG PO TABS: 5.0000 mg | ORAL_TABLET | ORAL | Status: DC | PRN

## 2013-04-22 MED ORDER — DEXTROMETHORPHAN POLISTIREX 30 MG/5ML PO LQCR: 30.0000 mg | Freq: Two times a day (BID) | ORAL | Status: DC

## 2013-04-22 MED ORDER — SODIUM CHLORIDE 0.9 % IJ SOLN
10.0000 mL | INTRAMUSCULAR | Status: DC | PRN
  Administered 2013-04-22: 10 mL

## 2013-04-22 MED ORDER — HYDROCOD POLST-CHLORPHEN POLST 10-8 MG/5ML PO LQCR: 5.0000 mL | Freq: Two times a day (BID) | ORAL | Status: DC | PRN

## 2013-04-22 MED ORDER — PYRIDOXINE HCL 50 MG PO TABS: 50.0000 mg | ORAL_TABLET | Freq: Every day | ORAL | Status: DC

## 2013-04-22 MED ORDER — PYRAZINAMIDE 500 MG PO TABS: 1500.0000 mg | ORAL_TABLET | Freq: Every day | ORAL | Status: DC

## 2013-04-22 MED ORDER — RIFABUTIN 150 MG PO CAPS: 300.0000 mg | ORAL_CAPSULE | Freq: Every day | ORAL | Status: DC

## 2013-04-22 MED ORDER — HEPARIN SOD (PORK) LOCK FLUSH 100 UNIT/ML IV SOLN
250.0000 [IU] | INTRAVENOUS | Status: AC | PRN
  Administered 2013-04-22: 500 [IU]

## 2013-04-22 NOTE — Discharge Summary (Signed)
Physician Discharge Summary  Monique [name] AOZ:[id number] DOB: 04/21/1971 DOA: 04/09/2013  PCP: Default, Provider, MD  Admit date: 04/09/2013 Discharge date: 04/22/2013  Time spent: 40 minutes  Recommendations for Outpatient Follow-up:  1. Home with [facility] for IV abx 2.  follow up with Dr Zenaida Niece dam in 1-2 weeks. Patient will e started on ART during that time.  3. Needs 6 months of empiric ATT for pulmonary TB.   Discharge Diagnoses:  Principal Problem:   Sepsis  Active Problems:   Pneumonia with lung abscess   Human immunodeficiency virus (HIV) disease   Acute kidney injury   Acute respiratory failure with hypoxia   Dehydration with hyponatremia   Hypokalemia   Pulmonary tuberculosis   HTN   Discharge Condition: fair  Diet recommendation: regular  Filed Weights   04/11/13 0400 04/12/13 0421 04/13/13 1706  Weight: 88.4 kg (194 lb 14.2 oz) 92.7 kg (204 lb 5.9 oz) 92.534 kg (204 lb)    History of present illness:  Please refer to admission H&P for details, but in brief, 42 year old female patient with history of hypertension and tobacco abuse presented to the emergency department with subjective fevers and chills for 3 days and nonproductive cough. This was associated with anorexia for one week. The evening prior to presentation she had been cleaning her house she developed sharp pain in her left chest that was quite severe and was associated with shortness of breath. In the emergency department the patient was tachycardic with a heart rate of 120 and respiratory rate of 25. she had a fever of 102.8 and her O2 sats are 90% on room air. She had leukocytosis with a white count of 13,700 hemoglobin was 10.4 sodium 125 potassium 2.6 bicarbonate 18 creatinine 1.65. Portable chest x-ray was concerning for a mass in the left upper lobe. CT angiogram of the chest revealed consolidation in the left upper lobe consistent with pneumonia and a possible central pulmonary abscess. No evidence of  pulmonary embolus. Because of her symptoms and vital signs she was given 3 L of normal saline and started on IV Rocephin and azithromycin followed by a dose of IV clindamycin and was admitted to the step down unit. Infectious disease and pulmonology was consulted.    Hospital Course:  Sepsis  Secondary to left lung abscess and pneumonia  - Chest x-ray done on 8/21 showed expansion of the left upper lobe masslike area of consolidation with increasing central cavitation.  - CT guided biopsy of the left lung mass done on 8/22. Tissue cx negative for growth  - Started on RIPE +avelox on 8/20, continue bactrim for AIDS prophylaxis  - C. difficile negative  - CBC, BMET improved.  -she will be empirically treated with ATT for 6 months. Health department has been notified and provision of her ATT medications will be taken care of.   Acute respiratory failure with hypoxia/Pneumonia with left upper lung abscess:  Sats better  -3 AFB's are negative,Quantiferon Gold positive, concern given new dx HIV, low CD4 70, decision was made by infectious disease to treat for active pulmonary TB.  - Bronched on 08/18, BAL with C. albicans however no significance, no oral thrush seen on exam, benign reactive changes on the bronchial washing.  -- bx of lung mass shows no growth..  - Cont ethambutol, INH, Pyrazinamide, rifampin, pyridoxine, covered with zyvox and Avelox for lung abscess.  -Antibiotic switched to IV vancomycin and imipenem per ID on 8/26 due to insurance issues and [facility]. PICC line placed on 8/27. She will be treated for at least 4 weeks. Needs weekly CBC and CMET while on abx. - will need to wait for another one to two  week prior to initiation of HAART  -patient has remained afebrile for almost 48 hrs now and clinically much improved.   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   HTN  will discharge on lotensin  Code Status: Full code  Disposition: home with outpt follow up with ID  Consults:  Pulmonology-  Infectious disease Dr. Algis Liming / [facility]  Interventional radiology  CTVS ( Dr Dorris Fetch)  Antibiotics  Ethambutol  Isoniazid  Pyrazinamide  Rifampin  pyridoxine  Avelox  Bactrim 8/18 >>  Vancomycin ( 8/14>> until 9/26) Meropenem ( 8/26>> until 9/26)     Discharge Exam: Filed Vitals:   04/22/13 1300  BP: 119/77  Pulse: 100  Temp: 98.1 F (36.7 C)  Resp: 16   General: Middle aged female in no acute distress  HEENT: No pallor, moist oral mucosa  Chest: Clear breath sounds bilaterally, no added sounds  CVS: Normal S1 and S2, no murmurs rub or gallop  Abdomen: Soft, nontender, nondistended, bowel sounds present  Extremities: Warm, no edema , RUE PICC CNS: AAO x3   Discharge Instructions     Medication List    STOP taking these medications       aspirin EC 325 MG tablet     candesartan-hydrochlorothiazide 32-12.5 MG per tablet  Commonly known as:  ATACAND HCT     ibuprofen 200 MG tablet  Commonly known as:  ADVIL,MOTRIN      TAKE these medications       benazepril-hydrochlorthiazide 20-12.5 MG per tablet  Commonly known as:  LOTENSIN HCT  Take 1 tablet by mouth daily.     chlorpheniramine-HYDROcodone 10-8 MG/5ML Lqcr  Commonly known as:  TUSSIONEX  Take 5 mLs by mouth every 12 (twelve) hours as needed.     dextromethorphan 30 MG/5ML liquid  Commonly known as:  DELSYM  Take 5 mLs (30 mg total) by mouth 2 (two) times daily.     ethambutol 400 MG tablet  Commonly known as:  MYAMBUTOL  Take 3 tablets (1,200 mg total) by mouth daily.     fluconazole 100 MG tablet  Commonly known as:  DIFLUCAN  Take 1 tablet (100 mg total) by mouth daily.     isoniazid 300 MG tablet  Commonly known  as:  NYDRAZID  Take 1 tablet (300 mg total) by mouth daily.     multivitamin with minerals Tabs tablet  Take 1 tablet by mouth daily.     oxyCODONE 5 MG immediate release tablet  Commonly known as:  Oxy IR/ROXICODONE  Take 1 tablet (5 mg total) by mouth every 4 (four) hours as needed.     pyrazinamide 500 MG tablet  Take 3 tablets (1,500 mg total) by mouth daily.     pyridOXINE 50 MG tablet  Commonly known as:  B-6  Take 1 tablet (50 mg total) by mouth daily.     rifabutin 150 MG capsule  Commonly known as:  MYCOBUTIN  Take 2 capsules (300 mg total) by mouth daily.     sodium chloride 0.9 % SOLN  100 mL with meropenem 1 G SOLR 1 g  Inject 1 g into the vein every 8 (eight) hours. Until 9/26     sulfamethoxazole-trimethoprim 800-160 MG per tablet  Commonly known as:  BACTRIM DS  Take 1 tablet by mouth daily.     vancomycin 1 GM/200ML Soln  Commonly known as:  VANCOCIN  Inject 200 mLs (1,000 mg total) into the vein every 12 (twelve) hours. Until 9/26       No Known Allergies     Follow-up Information   Follow up with [name], MD In 1 week.   Specialty:  Infectious Diseases   Contact information:   [address] [place] [address] [phone]        The results of significant diagnostics from this hospitalization (including imaging, microbiology, ancillary and laboratory) are listed below for reference.    Significant Diagnostic Studies: Dg Chest 2 View  04/18/2013   *RADIOLOGY REPORT*  Clinical Data: Tiny left pneumothorax, follow-up.  Left lung infiltrate, follow up.  CHEST - 2 VIEW  Comparison: 04/17/2013  Findings: Extensive consolidation throughout most of the left lung is unchanged.  The tiny apical pneumothorax seen on the previous day's study is not appreciated on the current exam.  The right lung remains clear.  Cardiac silhouette is normal in size. No mediastinal or masses are noted.  IMPRESSION: The tiny left apical  pneumothorax seen on the previous day's study is not appreciated on the current exam.  Persistent extensive left lung consolidation is stable.   Original Report Authenticated By: Amie Portland, M.D.   Dg Chest 2 View  04/17/2013   *RADIOLOGY REPORT*  Clinical Data: Follow up pneumothorax  CHEST - 2 VIEW  Comparison: 04/17/2013 at 1445 hours  Findings: Enlargement of cardiac silhouette. Mediastinal contours and pulmonary vascularity normal. Extensive infiltrate persists throughout left lung with a persistent area of cavitation and abscess formation in the left upper lobe. Persistent small left apex pneumothorax, minimally increased since previous exam. Medial pneumothorax along the left mediastinal border is slightly smaller. Minimal right basilar atelectasis. Remaining lungs clear. Small left pleural effusion is present. No new osseous abnormalities.  IMPRESSION: Left hydropneumothorax with minimal increase in size of the pneumothorax component at the left apex and slight decrease in the pneumothorax component along the mediastinal border. Extensive infiltrate throughout left lung with cavitation / abscess formation in the left upper lobe.   Original Report Authenticated By: Ulyses Southward, M.D.   Dg Chest 2 View  04/16/2013   *RADIOLOGY REPORT*  Clinical Data: Reevaluate lung abscess versus TB or PCP.  Shortness of breath with chest pain and fever  CHEST - 2 VIEW  Comparison: 04/09/2013  Findings: Persistent area of consolidation is identified involving the superior segment of the left upper lobe. This appears slightly more extensive than on the prior exam. An interval increase in the degree of central cavitation is noted in comparison with the prior exam.  New associated pleural effusion/thickening is noted at the left costophrenic angle.  Increased density in the left lower lung zone may reflect associated atelectasis or spreading infection. The right lung remains clear.  Bony structures are intact.  IMPRESSION:  Some expansion of the left upper lobe mass like area of consolidation with increasing central cavitation.  New increased density at the left base is questionably related to atelectasis or spread of infection with new left basilar pleural reaction/pleural fluid.   Original Report Authenticated By: Rhodia Albright, M.D.  Ct Angio Chest Pe W/cm &/or Wo Cm  04/09/2013   *RADIOLOGY REPORT*  Clinical Data: Left upper chest pain and shortness of breath. Nonproductive cough and fever.  CT ANGIOGRAPHY CHEST  Technique:  Multidetector CT imaging of the chest using the standard protocol during bolus administration of intravenous contrast. Multiplanar reconstructed images including MIPs were obtained and reviewed to evaluate the vascular anatomy.  Contrast: OMNIPAQUE IOHEXOL 350 MG/ML SOLN  Comparison: Chest radiograph 04/09/2013.  Findings: No pulmonary embolus.  Mediastinal lymph nodes measure up to 9 mm in the low right paratracheal station.  Axillary lymph nodes measure up to 13 mm on the left.  Heart is at the upper limits of normal in size to mildly enlarged.  No pericardial effusion.  Trace left pleural fluid.  Large area of airspace consolidation and surrounding inflammatory change in the left upper lobe with internal low attenuation and air.  Airway remains grossly patent. Minimal scattered peribronchovascular nodularity in the right lung.  Incidental imaging of the upper abdomen shows no acute findings. No worrisome lytic or sclerotic lesions.  IMPRESSION:  1.  Negative for pulmonary embolus. 2.  Consolidative findings in the left upper lobe are most consistent with pneumonia and associated central abscess. Follow-up to clearing is recommended as malignancy cannot be definitively excluded.  These results were called by telephone on 04/09/2013 at 1200 hours to Dr. Rubin Payor, who verbally acknowledged these results. 3.  Mediastinal and axillary lymph nodes may be reactive. 4.  Trace left pleural fluid.    Original Report Authenticated By: Leanna Battles, M.D.   Ct Biopsy  04/17/2013   *RADIOLOGY REPORT*  Clinical data:  Enlarging cavitary left upper lobe lung lesion. Bronchoscopic biopsies unrevealing.  CT-GUIDED LUNG CORE BIOPSY  Technique and findings: The procedure, risks (including but not limited to bleeding, infection, organ damage, pneumothorax, chest tube), benefits, and alternatives were explained to the patient. Questions regarding the procedure were encouraged and answered. The patient understands and consents to the procedure.Select axial scans through the thorax were obtained.  The process was localized and an appropriate skin entry site determined. Operator donned sterile gloves and mask.   Site was marked, prepped with Betadine, draped in usual sterile fashion, infiltrated locally with 1% lidocaine.  Intravenous Fentanyl and Versed were administered as conscious sedation during continuous cardiorespiratory monitoring by the radiology RN, with a total moderate sedation time of 20 minutes.  Under CT fluoroscopic guidance, a 17 gauge trocar needle was advanced to the peripheral margin of the lesion.  Once needle tip position was confirmed, coaxial 18-gauge core biopsy samples were obtained, submitted in formalin and saline to  surgical pathology and microbiology respectively for the requested laboratory studies. The guide needle was removed.  Post   scans show no significant hemorrhage or pneumothorax. The patient tolerated the procedure well.  No immediate complication.  IMPRESSION: 1.  Technically successful CT-guided core biopsy of left upper lobe cavitary lung mass.   Original Report Authenticated By: D. Andria Rhein, MD   Dg Chest 1v Repeat Same Day  04/17/2013   *RADIOLOGY REPORT*  Clinical Data: Post left lung biopsy.  Rule out pneumothorax.  CHEST - 1 VIEW SAME DAY  Comparison: CT chest from today.  Chest x-ray from today.  Findings: Normal tiny left apical pneumothorax is unchanged.  Large  cavitary pneumonia left upper lobe again noted.  The patient has developed a small left effusion with which appears larger compared with earlier today.  Continued follow-up is suggested.  The right  lung is clear.  IMPRESSION: Tiny left apical pneumothorax is unchanged.  Large cavitary pneumonia left upper lobe is unchanged.  Small left effusion appears larger compared with earlier today.   Original Report Authenticated By: Janeece Riggers, M.D.   Dg Chest Port 1 View  04/17/2013   *RADIOLOGY REPORT*  Clinical Data: Status post left lung biopsy  PORTABLE CHEST - 1 VIEW  Comparison: 04/16/2013  Findings: Cardiac shadow is stable.  An apical cavitary lesion is again identified within the left lung. A tiny pneumothorax is noted in the apex.  No other focal abnormality is seen.  No bony abnormality is noted.  IMPRESSION: Tiny left pneumothorax.  The remainder of the exam is stable.   Original Report Authenticated By: Alcide Clever, M.D.   Dg Chest Portable 1 View  04/09/2013   *RADIOLOGY REPORT*  Clinical Data: Chest pain  PORTABLE CHEST - 1 VIEW  Comparison: July 03, 2007  Findings:  There is a large area of consolidation with questionable mass in the left upper lobe.  This area of opacity measures 10.1 x 6.3 cm.  Right lung is clear.  Heart is mildly enlarged with normal pulmonary vascularity.  No adenopathy.  IMPRESSION: Consolidation with questionable mass left upper lobe.  Advise correlation with contrast enhanced chest CT to further evaluate. Right lung clear.  Mild cardiomegaly.   Original Report Authenticated By: Bretta Bang, M.D.    Microbiology: Recent Results (from the past 240 hour(s))  AFB CULTURE WITH SMEAR     Status: None   Collection Time    04/12/13  3:04 PM      Result Value Range Status   Specimen Description NASOPHARYNGEAL   Final   Special Requests Immunocompromised   Final   ACID FAST SMEAR     Final   Value: NO ACID FAST BACILLI SEEN     Performed at [facility]    Culture     Final   Value: CULTURE WILL BE EXAMINED FOR 6 WEEKS BEFORE ISSUING A FINAL REPORT     Performed at [facility]   Report Status PENDING   Incomplete  AFB CULTURE WITH SMEAR     Status: None   Collection Time    04/13/13  5:40 PM      Result Value Range Status   Specimen Description BRONCHIAL ALVEOLAR LAVAGE   Final   Special Requests Immunocompromised   Final   ACID FAST SMEAR     Final   Value: NO ACID FAST BACILLI SEEN     Performed at [facility]   Culture     Final   Value: CULTURE WILL BE EXAMINED FOR 6 WEEKS BEFORE ISSUING A FINAL REPORT     Performed at [facility]   Report Status PENDING   Incomplete  FUNGUS CULTURE W SMEAR     Status: None   Collection Time    04/13/13  5:40 PM      Result Value Range Status   Specimen Description BRONCHIAL ALVEOLAR LAVAGE   Final   Special Requests Immunocompromised   Final   Fungal Smear     Final   Value: RARE YEAST     Performed at [facility]   Culture     Final   Value: CANDIDA ALBICANS     Performed at [facility]   Report Status PENDING   Incomplete  LEGIONELLA CULTURE     Status: None   Collection Time    04/13/13  5:40 PM      Result Value Range Status   Specimen Description BRONCHIAL ALVEOLAR LAVAGE   Final   Special Requests Immunocompromised   Final   Culture     Final   Value: NO LEGIONELLA ISOLATED     Performed at [facility]   Report Status 04/19/2013 FINAL   Final  PNEUMOCYSTIS JIROVECI SMEAR BY DFA     Status: None   Collection Time    04/13/13  5:40 PM      Result Value Range Status   Specimen Source-PJSRC BRONCHIAL ALVEOLAR LAVAGE   Final   Pneumocystis jiroveci Ag NEGATIVE   Final   Comment: Performed at [facility]  RESPIRATORY VIRUS PANEL     Status: None   Collection Time    04/13/13  5:40 PM      Result Value Range Status   Source - RVPAN BRONCHIAL ALVEOLAR LAVAGE   Corrected   Comment: CORRECTED ON 08/19 AT 1841:  PREVIOUSLY REPORTED AS BRONCHIAL ALVEOLAR LAVAGE   Respiratory Syncytial Virus A NOT DETECTED   Final   Respiratory Syncytial Virus B NOT DETECTED   Final   Influenza A NOT DETECTED   Final   Influenza B NOT DETECTED   Final   Parainfluenza 1 NOT DETECTED   Final   Parainfluenza 2 NOT DETECTED   Final   Parainfluenza 3 NOT DETECTED   Final   Metapneumovirus NOT DETECTED   Final   Rhinovirus NOT DETECTED   Final   Adenovirus NOT DETECTED   Final   Influenza A H1 NOT DETECTED   Final   Influenza A H3 NOT DETECTED   Final   Comment: (NOTE)           Normal Reference Range for each Analyte: NOT DETECTED     Testing performed using the Luminex xTAG Respiratory Viral Panel test     kit.     This test was developed and its performance characteristics determined     by [facility]. It has not been cleared or approved by the [place]     Food and Drug Administration. This test is used for clinical purposes.     It should not be regarded as investigational or for research. This     laboratory is certified under the Clinical Laboratory Improvement     Amendments of 1988 (CLIA) as qualified to perform high complexity     clinical laboratory testing.     Performed at [facility]  M. TUBERCULOSIS COMPLEX BY PCR     Status: None   Collection Time    04/13/13  5:40 PM      Result Value Range Status   M. tuberculosis, Direct Not detected  Not detected Final   Comment: (NOTE)     This test(s) was developed and its performance characteristics      have been determined by [facility],      [place], [place]. Performance characteristics refer to the analytical      performance of the test.     Performed at [facility]   Source (MTBPCR) BRONCHIAL ALVEOLAR LAVAGE   Final  CULTURE, BAL-QUANTITATIVE     Status: None   Collection Time    04/13/13  5:40 PM      Result Value Range Status   Specimen Description BRONCHIAL ALVEOLAR LAVAGE   Final   Special  Requests NONE   Final   Gram Stain  Final   Value: RARE WBC PRESENT, PREDOMINANTLY PMN     NO SQUAMOUS EPITHELIAL CELLS SEEN     NO ORGANISMS SEEN     Performed at [facility] Count     Final   Value: 50,000 COLONIES/ML     Performed at [facility]   Culture     Final   Value: CANDIDA ALBICANS     Performed at [facility]   Report Status 04/16/2013 FINAL   Final  CLOSTRIDIUM DIFFICILE BY PCR     Status: None   Collection Time    04/14/13  2:26 PM      Result Value Range Status   C difficile by pcr NEGATIVE  NEGATIVE Final  FUNGUS CULTURE W SMEAR     Status: None   Collection Time    04/17/13 10:35 AM      Result Value Range Status   Specimen Description TISSUE LUNG LEFT   Final   Special Requests CT CORE LEFT LUNG ABSCESS   Final   Fungal Smear     Final   Value: NO YEAST OR FUNGAL ELEMENTS SEEN     Performed at [facility]   Culture     Final   Value: CULTURE IN PROGRESS FOR FOUR WEEKS     Performed at [facility]   Report Status PENDING   Incomplete  TISSUE CULTURE     Status: None   Collection Time    04/17/13 10:35 AM      Result Value Range Status   Specimen Description TISSUE LUNG LEFT   Final   Special Requests CT CORE LEFT LUNG ABCESS   Final   Gram Stain     Final   Value: NO WBC SEEN     NO ORGANISMS SEEN     Performed at [facility]   Culture     Final   Value: NO GROWTH 3 DAYS     Performed at [facility]   Report Status 04/20/2013 FINAL   Final  AFB CULTURE WITH SMEAR     Status: None   Collection Time    04/17/13 10:36 AM      Result Value Range Status   Specimen Description TISSUE LUNG LEFT   Final   Special Requests CT CORE LEFT LUNG ABSCESS   Final   ACID FAST SMEAR     Final   Value: NO ACID FAST BACILLI SEEN     Performed at [facility]   Culture     Final   Value: CULTURE WILL BE EXAMINED FOR 6 WEEKS BEFORE ISSUING A FINAL REPORT     Performed at [facility]   Report Status PENDING   Incomplete     Labs: Basic Metabolic Panel:  Recent Labs Lab 04/16/13 0500 04/17/13 0520 04/18/13 1515 04/21/13 0430  NA 134* 135 132* 135  K 3.5 3.6 4.1 4.2  CL 102 102 101 105  CO2 19 21 21 22   GLUCOSE 100* 82 100* 86  BUN 12 9 8 8   CREATININE 1.40* 1.30* 1.11* 1.07  CALCIUM 8.7 8.5 8.7 8.7   Liver Function Tests:  Recent Labs Lab 04/20/13 1120  AST 131*  ALT 97*  ALKPHOS 81  BILITOT 0.2*  PROT 7.8  ALBUMIN 2.0*   No results found for this basename: LIPASE, AMYLASE,  in the last 168 hours No results found for this basename: AMMONIA,  in the last  168 hours CBC:  Recent Labs Lab 04/16/13 0500 04/17/13 0520 04/18/13 1515 04/21/13 0430  WBC 19.7* 15.0* 13.0* 9.9  HGB 8.4* 8.5* 9.1* 8.5*  HCT 24.0* 24.3* 26.1* 24.6*  MCV 80.8 81.8 83.1 83.7  PLT 428* 455* 448* 390   Cardiac Enzymes: No results found for this basename: CKTOTAL, CKMB, CKMBINDEX, TROPONINI,  in the last 168 hours BNP: BNP (last 3 results) No results found for this basename: PROBNP,  in the last 8760 hours CBG: No results found for this basename: GLUCAP,  in the last 168 hours     Signed:  Eddie North  [facility] 04/22/2013, 2:21 PM

## 2013-04-22 NOTE — Progress Notes (Signed)
[facility]    Subjective: Patient states she is doing well. She has a very mild cough still and productive of only mild sputum slight blood tinge. She has remained afebrile for the past 24 hours as well. She denies fevers, chills, sweats, N/V/D, constipation.    Antibiotics:  Anti-infectives   Start     Dose/Rate Route Frequency Ordered Stop   04/22/13 0000  ethambutol (MYAMBUTOL) 400 MG tablet     1,200 mg Oral Daily 04/22/13 1407     04/22/13 0000  fluconazole (DIFLUCAN) 100 MG tablet     100 mg Oral Daily 04/22/13 1407     04/22/13 0000  isoniazid (NYDRAZID) 300 MG tablet     300 mg Oral Daily 04/22/13 1407     04/22/13 0000  sodium chloride 0.9 % SOLN 100 mL with meropenem 1 G SOLR 1 g     1 g 200 mL/hr over 30 Minutes Intravenous Every 8 hours 04/22/13 1407     04/22/13 0000  pyrazinamide 500 MG tablet     1,500 mg Oral Daily 04/22/13 1407     04/22/13 0000  rifabutin (MYCOBUTIN) 150 MG capsule     300 mg Oral Daily 04/22/13 1407     04/22/13 0000  sulfamethoxazole-trimethoprim (BACTRIM DS) 800-160 MG per tablet     1 tablet Oral Daily 04/22/13 1407     04/22/13 0000  vancomycin (VANCOCIN) 1 GM/200ML SOLN  Status:  Discontinued     1,000 mg 200 mL/hr over 60 Minutes Intravenous Every 12 hours 04/22/13 1407 04/22/13    04/22/13 0000  vancomycin (VANCOCIN) 1 GM/200ML SOLN     1,000 mg 200 mL/hr over 60 Minutes Intravenous Every 12 hours 04/22/13 1534     04/21/13 1130  vancomycin (VANCOCIN) IVPB 1000 mg/200 mL premix     1,000 mg 200 mL/hr over 60 Minutes Intravenous Every 12 hours 04/21/13 1106     04/21/13 1130  meropenem (MERREM) 1 g in sodium chloride 0.9 % 100 mL IVPB     1 g 200 mL/hr over 30 Minutes Intravenous Every 8 hours 04/21/13 1106     04/21/13 1000  ethambutol (MYAMBUTOL) tablet 1,200 mg     1,200 mg Oral Daily 04/20/13 1352     04/21/13 1000  pyrazinamide tablet 1,500 mg     1,500 mg Oral Daily 04/20/13 1352     04/20/13  1300  fluconazole (DIFLUCAN) tablet 100 mg     100 mg Oral Daily 04/20/13 1210     04/17/13 2200  linezolid (ZYVOX) tablet 600 mg  Status:  Discontinued     600 mg Oral Every 12 hours 04/17/13 1849 04/21/13 1054   04/15/13 1600  rifabutin (MYCOBUTIN) capsule 300 mg     300 mg Oral Daily 04/15/13 1454     04/15/13 1000  ethambutol (MYAMBUTOL) tablet 1,400 mg  Status:  Discontinued     15 mg/kg  92.5 kg Oral Daily 04/15/13 0801 04/20/13 1352   04/15/13 1000  rifampin (RIFADIN) capsule 600 mg  Status:  Discontinued     600 mg Oral Daily 04/15/13 0801 04/15/13 1454   04/15/13 1000  pyrazinamide tablet 2,000 mg  Status:  Discontinued     2,000 mg Oral Daily 04/15/13 0801 04/20/13 1352   04/15/13 1000  isoniazid (NYDRAZID) tablet 300 mg     300 mg Oral Daily 04/15/13 0801     04/15/13 1000  moxifloxacin (AVELOX) tablet 400 mg  Status:  Discontinued     400 mg Oral Daily 04/15/13 0801 04/21/13 1054   04/13/13 1845  sulfamethoxazole-trimethoprim (BACTRIM DS) 800-160 MG per tablet 1 tablet     1 tablet Oral Daily 04/13/13 1813     04/11/13 2000  Ampicillin-Sulbactam (UNASYN) 3 g in sodium chloride 0.9 % 100 mL IVPB  Status:  Discontinued     3 g 100 mL/hr over 60 Minutes Intravenous Every 6 hours 04/11/13 1439 04/15/13 1007   04/11/13 1500  vancomycin (VANCOCIN) IVPB 1000 mg/200 mL premix  Status:  Discontinued     1,000 mg 200 mL/hr over 60 Minutes Intravenous Every 12 hours 04/11/13 1441 04/13/13 0935   04/10/13 1400  vancomycin (VANCOCIN) IVPB 1000 mg/200 mL premix  Status:  Discontinued     1,000 mg 200 mL/hr over 60 Minutes Intravenous Every 24 hours 04/09/13 1323 04/11/13 1441   04/09/13 2000  Ampicillin-Sulbactam (UNASYN) 3 g in sodium chloride 0.9 % 100 mL IVPB  Status:  Discontinued     3 g 100 mL/hr over 60 Minutes Intravenous Every 8 hours 04/09/13 1328 04/11/13 1439   04/09/13 1330  vancomycin (VANCOCIN) 1,250 mg in sodium chloride 0.9 % 250 mL IVPB     1,250 mg 166.7 mL/hr over  90 Minutes Intravenous STAT 04/09/13 1322 04/09/13 1538   04/09/13 1315  clindamycin (CLEOCIN) IVPB 600 mg     600 mg 100 mL/hr over 30 Minutes Intravenous  Once 04/09/13 1302 04/09/13 1359   04/09/13 0900  cefTRIAXone (ROCEPHIN) 1 g in dextrose 5 % 50 mL IVPB     1 g 100 mL/hr over 30 Minutes Intravenous  Once 04/09/13 0850 04/09/13 1042   04/09/13 0900  azithromycin (ZITHROMAX) 500 mg in dextrose 5 % 250 mL IVPB     500 mg 250 mL/hr over 60 Minutes Intravenous  Once 04/09/13 0850 04/09/13 1227      Medications: Scheduled Meds: . amLODipine  10 mg Oral Daily  . benzonatate  100 mg Oral TID  . dextromethorphan  30 mg Oral BID  . ethambutol  1,200 mg Oral Daily  . fluconazole  100 mg Oral Daily  . isoniazid  300 mg Oral Daily  . meropenem (MERREM) IV  1 g Intravenous Q8H  . pyrazinamide  1,500 mg Oral Daily  . vitamin B-6  50 mg Oral Daily  . rifabutin  300 mg Oral Daily  . sodium chloride  3 mL Intravenous Q12H  . sulfamethoxazole-trimethoprim  1 tablet Oral Daily  . vancomycin  1,000 mg Intravenous Q12H   Continuous Infusions:  PRN Meds:.acetaminophen, acetaminophen, chlorpheniramine-HYDROcodone, HYDROcodone-acetaminophen, oxyCODONE, sodium chloride   Objective: Weight change:   Intake/Output Summary (Last 24 hours) at 04/22/13 1537 Last data filed at 04/21/13 1635  Gross per 24 hour  Intake    361 ml  Output      0 ml  Net    361 ml   Blood pressure 119/77, pulse 100, temperature 98.1 F (36.7 C), temperature source Oral, resp. rate 16, height 5\' 2"  (1.575 m), weight 92.534 kg (204 lb), last menstrual period 03/09/2013, SpO2 100.00%. Temp:  [98.1 F (36.7 C)-99.5 F (37.5 C)] 98.1 F (36.7 C) (08/27 1300) Pulse Rate:  [95-100] 100 (08/27 1300) Resp:  [16] 16 (08/27 1300) BP: (118-133)/(77-81) 119/77 mmHg (08/27 1300) SpO2:  [96 %-100 %] 100 % (08/27 1300)  Physical Exam: General: Alert and awake, oriented x3, not in any acute distress.  HEENT: anicteric  sclera, pupils reactive to light and accommodation, EOMI,  minor oral thrush, improving  CVS regular rate, normal r, no murmur rubs or gallops  Chest: improving but decreased breath sounds LUL, coarse breath sounds throughout remaining left lung, R CTA  Abdomen: soft nontender, nondistended, normal bowel sounds  Extremities: no edema  Skin: no rashes  Lymph: no new lymphadenopathy  Neuro: nonfocal  Lab Results:  Recent Labs  04/21/13 0430  WBC 9.9  HGB 8.5*  HCT 24.6*  PLT 390    BMET  Recent Labs  04/21/13 0430  NA 135  K 4.2  CL 105  CO2 22  GLUCOSE 86  BUN 8  CREATININE 1.07  CALCIUM 8.7    Micro Results: Recent Results (from the past 240 hour(s))  AFB CULTURE WITH SMEAR     Status: None   Collection Time    04/13/13  5:40 PM      Result Value Range Status   Specimen Description BRONCHIAL ALVEOLAR LAVAGE   Final   Special Requests Immunocompromised   Final   ACID FAST SMEAR     Final   Value: NO ACID FAST BACILLI SEEN     Performed at [facility]   Culture     Final   Value: CULTURE WILL BE EXAMINED FOR 6 WEEKS BEFORE ISSUING A FINAL REPORT     Performed at [facility]   Report Status PENDING   Incomplete  FUNGUS CULTURE W SMEAR     Status: None   Collection Time    04/13/13  5:40 PM      Result Value Range Status   Specimen Description BRONCHIAL ALVEOLAR LAVAGE   Final   Special Requests Immunocompromised   Final   Fungal Smear     Final   Value: RARE YEAST     Performed at [facility]   Culture     Final   Value: CANDIDA ALBICANS     Performed at [facility]   Report Status PENDING   Incomplete  LEGIONELLA CULTURE     Status: None   Collection Time    04/13/13  5:40 PM      Result Value Range Status   Specimen Description BRONCHIAL ALVEOLAR LAVAGE   Final   Special Requests Immunocompromised   Final   Culture     Final   Value: NO LEGIONELLA ISOLATED     Performed at [facility]   Report Status  04/19/2013 FINAL   Final  PNEUMOCYSTIS JIROVECI SMEAR BY DFA     Status: None   Collection Time    04/13/13  5:40 PM      Result Value Range Status   Specimen Source-PJSRC BRONCHIAL ALVEOLAR LAVAGE   Final   Pneumocystis jiroveci Ag NEGATIVE   Final   Comment: Performed at [facility]  RESPIRATORY VIRUS PANEL     Status: None   Collection Time    04/13/13  5:40 PM      Result Value Range Status   Source - RVPAN BRONCHIAL ALVEOLAR LAVAGE   Corrected   Comment: CORRECTED ON 08/19 AT 1841: PREVIOUSLY REPORTED AS BRONCHIAL ALVEOLAR LAVAGE   Respiratory Syncytial Virus A NOT DETECTED   Final   Respiratory Syncytial Virus B NOT DETECTED   Final   Influenza A NOT DETECTED   Final   Influenza B NOT DETECTED   Final   Parainfluenza 1 NOT DETECTED   Final   Parainfluenza 2 NOT DETECTED   Final   Parainfluenza 3  NOT DETECTED   Final   Metapneumovirus NOT DETECTED   Final   Rhinovirus NOT DETECTED   Final   Adenovirus NOT DETECTED   Final   Influenza A H1 NOT DETECTED   Final   Influenza A H3 NOT DETECTED   Final   Comment: (NOTE)           Normal Reference Range for each Analyte: NOT DETECTED     Testing performed using the Luminex xTAG Respiratory Viral Panel test     kit.     This test was developed and its performance characteristics determined     by [facility]. It has not been cleared or approved by the [place]     Food and Drug Administration. This test is used for clinical purposes.     It should not be regarded as investigational or for research. This     laboratory is certified under the Clinical Laboratory Improvement     Amendments of 1988 (CLIA) as qualified to perform high complexity     clinical laboratory testing.     Performed at [facility]  M. TUBERCULOSIS COMPLEX BY PCR     Status: None   Collection Time    04/13/13  5:40 PM      Result Value Range Status   M. tuberculosis, Direct Not detected  Not detected Final   Comment: (NOTE)      This test(s) was developed and its performance characteristics      have been determined by [employer],      [place], [place]. Performance characteristics refer to the analytical      performance of the test.     Performed at [facility]   Source (MTBPCR) BRONCHIAL ALVEOLAR LAVAGE   Final  CULTURE, BAL-QUANTITATIVE     Status: None   Collection Time    04/13/13  5:40 PM      Result Value Range Status   Specimen Description BRONCHIAL ALVEOLAR LAVAGE   Final   Special Requests NONE   Final   Gram Stain     Final   Value: RARE WBC PRESENT, PREDOMINANTLY PMN     NO SQUAMOUS EPITHELIAL CELLS SEEN     NO ORGANISMS SEEN     Performed at [facility] Count     Final   Value: 50,000 COLONIES/ML     Performed at [facility]   Culture     Final   Value: CANDIDA ALBICANS     Performed at [facility]   Report Status 04/16/2013 FINAL   Final  CLOSTRIDIUM DIFFICILE BY PCR     Status: None   Collection Time    04/14/13  2:26 PM      Result Value Range Status   C difficile by pcr NEGATIVE  NEGATIVE Final  FUNGUS CULTURE W SMEAR     Status: None   Collection Time    04/17/13 10:35 AM      Result Value Range Status   Specimen Description TISSUE LUNG LEFT   Final   Special Requests CT CORE LEFT LUNG ABSCESS   Final   Fungal Smear     Final   Value: NO YEAST OR FUNGAL ELEMENTS SEEN     Performed at [facility]   Culture     Final   Value: CULTURE IN PROGRESS FOR FOUR WEEKS     Performed at [facility]   Report Status  PENDING   Incomplete  TISSUE CULTURE     Status: None   Collection Time    04/17/13 10:35 AM      Result Value Range Status   Specimen Description TISSUE LUNG LEFT   Final   Special Requests CT CORE LEFT LUNG ABCESS   Final   Gram Stain     Final   Value: NO WBC SEEN     NO ORGANISMS SEEN     Performed at [facility]   Culture     Final   Value: NO GROWTH 3 DAYS      Performed at [facility]   Report Status 04/20/2013 FINAL   Final  AFB CULTURE WITH SMEAR     Status: None   Collection Time    04/17/13 10:36 AM      Result Value Range Status   Specimen Description TISSUE LUNG LEFT   Final   Special Requests CT CORE LEFT LUNG ABSCESS   Final   ACID FAST SMEAR     Final   Value: NO ACID FAST BACILLI SEEN     Performed at [facility]   Culture     Final   Value: CULTURE WILL BE EXAMINED FOR 6 WEEKS BEFORE ISSUING A FINAL REPORT     Performed at [facility]   Report Status PENDING   Incomplete    Studies/Results: No results found.    Assessment/Plan: Monique [name] is a 42 y.o. female with HTN who presented with fevers/chills and left sided substernal chest pain TTP. Chest CT consistent with pneumonia and LUL abscess with need for f/u to confirm clearance and exclude any malignancy. 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  She underwent CT guided biopsy of her left lung on 04/17/13. Sent for path and culture which remain negative to date.   Anti-infectives:  Bactrim (8/18>>  Avelox (8/20>>8/26)  RIPE (8/20>>  Zyvox (8/22>>8/26)  Fluconazole (8/25>>  Meropenem (8/26>> Vancomycin (8/26>>  Pneumonia, with abscess:  --CT Biopsy NTD  --zosyn and avelox d/c'd yesterday  --blood cx, NGTD  --BAL with C. Albicans, minor thrush on tongue and is on fluconazole  --Respiratory virus panel, negative   HIV: Confirmatory testing with Western Blot, POSITIVE. Wishes for NO INFORMATION TO BE DISCUSSED WITH FAMILY PRESENT  --CD4 = 70, VL = 73,711  --HLA [id number], negative  --HIV genotype, No relevant mutations noted  --With treatment of pulmonary TB will need to wait 2 weeks (04/29/13 minimum) prior to initiation of HAART to decrease risk of IRIS  --continue Bactrim for PCP ppx  --oral thrush, on fluconazole  --f/u [facility] on outpatient for initiation of  HAART  Rule out TB:  --no longer on airborne precautions  --Quantiferon Gold POSITIVE  --AFB sputum x 3, negative; BAL: Negative  --continue rifabutin, INH, B6, pyrazinamide, ethambutol for 6 months    LOS: 13 days   Lewie Chamber 04/22/2013, 3:37 PM

## 2013-04-22 NOTE — Progress Notes (Signed)
Peripherally Inserted Central Catheter/Midline Placement  The IV Nurse has discussed with the patient and/or persons authorized to consent for the patient, the purpose of this procedure and the potential benefits and risks involved with this procedure.  The benefits include less needle sticks, lab draws from the catheter and patient may be discharged home with the catheter.  Risks include, but not limited to, infection, bleeding, blood clot (thrombus formation), and puncture of an artery; nerve damage and irregular heat beat.  Alternatives to this procedure were also discussed.  PICC/Midline Placement Documentation        Stacie Glaze Horton 04/22/2013, 10:01 AM

## 2013-04-22 NOTE — Progress Notes (Signed)
[facility]  Patient Status:   New pt for [facility] this hospital admission.  [facility] is providing the following services:   [facility] will provide pt with [facility] RN and [facility] services. [facility] hospital coordinator team will follow pt progress towards DC and support transition home when deemed appropriate by MD team.  If patient discharges after hours, please call [phone].   Sedalia Muta 04/22/2013, 11:03 AM

## 2013-04-22 NOTE — Progress Notes (Signed)
INFECTIOUS DISEASE ATTENDING ADDENDUM:     [facility]   Date: 04/22/2013  Patient name: Monique [name]  Medical record number: [id number]  Date of birth: [birth year]    This patient has been seen and discussed with the house staff. Please see their note for complete details. I concur with their findings with the following additions/corrections:  Patient to be set up with home IV antibiotics with vancomycin, merrem provided by [facility]  I spoke with Burnice Logan MD Director of [facility] and pt will continue on 4 drug TB therapy for at least 2 months (if all final cultures are negative for rx of LTB, vs 4 months of therapy if either rules in vs we feel still strong compelling need to give her full course of therapy  SHe will need close followup with repeat CXR as outpatient and prolonged antibacterial abx after getting thru course of IV abx  She needs ARV started next week and I am dissapointed we have not gotten her enrolled into [facility] yet apparently due to lack of photo ID  I will touch base with Cassandra and my clinic staff to see what we can do to expedite process.   I will find a place to overbook her in my clinic next week  [name] 04/22/2013, 4:31 PM

## 2013-04-22 NOTE — Progress Notes (Signed)
9 Days Post-Op Procedure(s) (LRB): VIDEO BRONCHOSCOPY WITHOUT FLUORO (Bilateral) Subjective: Feels better No fever last night Still has a cough but not much sputum production- "thin", no hemoptysis  Objective: Vital signs in last 24 hours: Temp:  [99.3 F (37.4 C)-99.5 F (37.5 C)] 99.5 F (37.5 C) (08/27 0508) Pulse Rate:  [95-103] 97 (08/27 0508) Cardiac Rhythm:  [-]  Resp:  [16-20] 16 (08/27 0508) BP: (113-133)/(62-81) 118/81 mmHg (08/27 0508) SpO2:  [96 %-100 %] 96 % (08/27 0508)  Hemodynamic parameters for last 24 hours:    Intake/Output from previous day: 08/26 0701 - 08/27 0700 In: 361 [IV Piggyback:361] Out: -  Intake/Output this shift:    General appearance: alert and no distress Lungs: diminished breath sounds on left  Lab Results:  Recent Labs  04/21/13 0430  WBC 9.9  HGB 8.5*  HCT 24.6*  PLT 390   BMET:  Recent Labs  04/21/13 0430  NA 135  K 4.2  CL 105  CO2 22  GLUCOSE 86  BUN 8  CREATININE 1.07  CALCIUM 8.7    PT/INR: No results found for this basename: LABPROT, INR,  in the last 72 hours [facility]    Component Value Date/Time   HCO3 24.3* 07/03/2007 1942   TCO2 24 10/29/2011 1754   ACIDBASEDEF 1.0 07/03/2007 1942   CBG (last 3)  No results found for this basename: GLUCAP,  in the last 72 hours  Assessment/Plan: S/P Procedure(s) (LRB): VIDEO BRONCHOSCOPY WITHOUT FLUORO (Bilateral) - She has defervesed with antibiotics No indication for surgery at this time Plan per medical service   LOS: 13 days    Arlin Sass C 04/22/2013

## 2013-04-23 ENCOUNTER — Emergency Department (HOSPITAL_COMMUNITY)
Admission: EM | Admit: 2013-04-23 | Discharge: 2013-04-23 | Disposition: A | Discharge status: Home / Self Care | Payer: Self-pay | Attending: Emergency Medicine | Admitting: Emergency Medicine

## 2013-04-23 ENCOUNTER — Telehealth: Payer: Self-pay | Admitting: Infectious Disease

## 2013-04-23 ENCOUNTER — Encounter (HOSPITAL_COMMUNITY): Payer: Self-pay | Admitting: *Deleted

## 2013-04-23 DIAGNOSIS — T80218A Other infection due to central venous catheter, initial encounter: Secondary | ICD-10-CM | POA: Insufficient documentation

## 2013-04-23 DIAGNOSIS — Z8709 Personal history of other diseases of the respiratory system: Secondary | ICD-10-CM | POA: Insufficient documentation

## 2013-04-23 DIAGNOSIS — Z8619 Personal history of other infectious and parasitic diseases: Secondary | ICD-10-CM | POA: Insufficient documentation

## 2013-04-23 DIAGNOSIS — Z79899 Other long term (current) drug therapy: Secondary | ICD-10-CM | POA: Insufficient documentation

## 2013-04-23 DIAGNOSIS — Y838 Other surgical procedures as the cause of abnormal reaction of the patient, or of later complication, without mention of misadventure at the time of the procedure: Secondary | ICD-10-CM | POA: Insufficient documentation

## 2013-04-23 DIAGNOSIS — I10 Essential (primary) hypertension: Secondary | ICD-10-CM | POA: Insufficient documentation

## 2013-04-23 DIAGNOSIS — T82898A Other specified complication of vascular prosthetic devices, implants and grafts, initial encounter: Secondary | ICD-10-CM

## 2013-04-23 HISTORY — DX: Respiratory tuberculosis unspecified: A15.9

## 2013-04-23 MED ORDER — DOLUTEGRAVIR SODIUM 50 MG PO TABS: 50.0000 mg | ORAL_TABLET | Freq: Every day | ORAL | Status: DC

## 2013-04-23 MED ORDER — PYRAZINAMIDE 500 MG PO TABS
1000.0000 mg | ORAL_TABLET | Freq: Every day | ORAL | Status: DC
  Filled 2013-04-23: qty 2

## 2013-04-23 MED ORDER — HEPARIN SOD (PORK) LOCK FLUSH 100 UNIT/ML IV SOLN
250.0000 [IU] | Freq: Every day | INTRAVENOUS | Status: DC
  Filled 2013-04-23: qty 3

## 2013-04-23 MED ORDER — SODIUM CHLORIDE 0.9 % IJ SOLN
20.0000 mL | INTRAMUSCULAR | Status: DC | PRN
  Administered 2013-04-23: 20 mL

## 2013-04-23 MED ORDER — PYRAZINAMIDE 500 MG PO TABS
500.0000 mg | ORAL_TABLET | Freq: Every day | ORAL | Status: DC
  Filled 2013-04-23: qty 1

## 2013-04-23 MED ORDER — PYRAZINAMIDE 500 MG PO TABS
1500.0000 mg | ORAL_TABLET | Freq: Every day | ORAL | Status: DC
  Administered 2013-04-23: 1500 mg via ORAL
  Filled 2013-04-23: qty 3

## 2013-04-23 MED ORDER — RIFABUTIN 150 MG PO CAPS
150.0000 mg | ORAL_CAPSULE | Freq: Once | ORAL | Status: DC
  Filled 2013-04-23: qty 1

## 2013-04-23 MED ORDER — SODIUM CHLORIDE 0.9 % IV SOLN
1.0000 g | Freq: Once | INTRAVENOUS | Status: AC
  Administered 2013-04-23: 1 g via INTRAVENOUS
  Filled 2013-04-23: qty 1

## 2013-04-23 MED ORDER — ALTEPLASE 2 MG IJ SOLR
2.0000 mg | Freq: Once | INTRAMUSCULAR | Status: AC
  Administered 2013-04-23: 2 mg
  Filled 2013-04-23: qty 2

## 2013-04-23 MED ORDER — RIFABUTIN 150 MG PO CAPS: 150.0000 mg | ORAL_CAPSULE | Freq: Once | ORAL | Status: DC

## 2013-04-23 MED ORDER — ABACAVIR SULFATE-LAMIVUDINE 600-300 MG PO TABS: 1.0000 | ORAL_TABLET | Freq: Every day | ORAL | Status: DC

## 2013-04-23 MED ORDER — SODIUM CHLORIDE 0.9 % IJ SOLN
10.0000 mL | Freq: Two times a day (BID) | INTRAMUSCULAR | Status: DC
  Administered 2013-04-23: 10 mL

## 2013-04-23 MED ORDER — MEROPENEM 1 G IV SOLR
1.0000 g | Freq: Three times a day (TID) | INTRAVENOUS | Status: DC
  Filled 2013-04-23: qty 1

## 2013-04-23 MED ORDER — ETHAMBUTOL HCL 400 MG PO TABS
1200.0000 mg | ORAL_TABLET | Freq: Every day | ORAL | Status: DC
  Administered 2013-04-23: 1200 mg via ORAL
  Filled 2013-04-23: qty 3

## 2013-04-23 MED ORDER — HEPARIN SOD (PORK) LOCK FLUSH 100 UNIT/ML IV SOLN
250.0000 [IU] | INTRAVENOUS | Status: DC | PRN
  Administered 2013-04-23: 500 [IU]
  Filled 2013-04-23: qty 3

## 2013-04-23 MED ORDER — VANCOMYCIN HCL IN DEXTROSE 1-5 GM/200ML-% IV SOLN
1000.0000 mg | Freq: Once | INTRAVENOUS | Status: AC
  Administered 2013-04-23: 1000 mg via INTRAVENOUS
  Filled 2013-04-23: qty 200

## 2013-04-23 MED ORDER — ETHAMBUTOL HCL 400 MG PO TABS
400.0000 mg | ORAL_TABLET | Freq: Every day | ORAL | Status: DC
  Filled 2013-04-23: qty 1

## 2013-04-23 MED ORDER — RIFABUTIN 150 MG PO CAPS
300.0000 mg | ORAL_CAPSULE | Freq: Once | ORAL | Status: AC
  Administered 2013-04-23: 300 mg via ORAL
  Filled 2013-04-23: qty 2

## 2013-04-23 MED ORDER — ETHAMBUTOL HCL 400 MG PO TABS
800.0000 mg | ORAL_TABLET | Freq: Every day | ORAL | Status: DC
  Filled 2013-04-23: qty 2

## 2013-04-23 NOTE — Progress Notes (Signed)
ANTIBIOTIC CONSULT NOTE - INITIAL  Pharmacy Consult for Meropenem Indication: PNA with lung abscess  No Known Allergies  Patient Measurements: Weight: 175 lb (79.379 kg)   Vital Signs: Temp: 99.1 F (37.3 C) (08/28 1354) Temp src: Oral (08/28 1354) BP: 110/76 mmHg (08/28 1615) Pulse Rate: 99 (08/28 1615) Intake/Output from previous day:   Intake/Output from this shift:    Labs:  Recent Labs  04/21/13 0430  WBC 9.9  HGB 8.5*  PLT 390  CREATININE 1.07   The CrCl is unknown because both a height and weight (above a minimum accepted value) are required for this calculation. No results found for this basename: VANCOTROUGH, Leodis Binet, VANCORANDOM, GENTTROUGH, GENTPEAK, GENTRANDOM, TOBRATROUGH, TOBRAPEAK, TOBRARND, AMIKACINPEAK, AMIKACINTROU, AMIKACIN,  in the last 72 hours   Microbiology: Recent Results (from the past 720 hour(s))  CULTURE, BLOOD (ROUTINE X 2)     Status: None   Collection Time    04/09/13  8:55 AM      Result Value Range Status   Specimen Description BLOOD RIGHT ANTECUBITAL   Final   Special Requests BOTTLES DRAWN AEROBIC ONLY   Final   Culture  Setup Time     Final   Value: 04/09/2013 17:08     Performed at [facility]   Culture     Final   Value: NO GROWTH 5 DAYS     Performed at [facility]   Report Status 04/15/2013 FINAL   Final  CULTURE, BLOOD (ROUTINE X 2)     Status: None   Collection Time    04/09/13  9:02 AM      Result Value Range Status   Specimen Description BLOOD ARM RIGHT   Final   Special Requests BOTTLES DRAWN AEROBIC ONLY   Final   Culture  Setup Time     Final   Value: 04/09/2013 17:07     Performed at [facility]   Culture     Final   Value: NO GROWTH 5 DAYS     Performed at [facility]   Report Status 04/15/2013 FINAL   Final  MRSA PCR SCREENING     Status: None   Collection Time    04/09/13  3:05 PM      Result Value Range Status   MRSA by PCR NEGATIVE  NEGATIVE Final   Comment:            The GeneXpert MRSA Assay (FDA     approved for NASAL specimens     only), is one component of a     comprehensive MRSA colonization     surveillance program. It is not     intended to diagnose MRSA     infection nor to guide or     monitor treatment for     MRSA infections.  CULTURE, EXPECTORATED SPUTUM-ASSESSMENT     Status: None   Collection Time    04/09/13  5:18 PM      Result Value Range Status   Specimen Description SPUTUM   Final   Special Requests Normal   Final   Sputum evaluation     Final   Value: MICROSCOPIC FINDINGS SUGGEST THAT THIS SPECIMEN IS NOT REPRESENTATIVE OF LOWER RESPIRATORY SECRETIONS. PLEASE RECOLLECT.     CALLED TO RN S.MERLINI AT 0053 04/10/13 BY L.PITT   Report Status 04/10/2013 FINAL   Final  CULTURE, EXPECTORATED SPUTUM-ASSESSMENT     Status: None   Collection Time    04/10/13  3:04 AM      Result Value Range Status   Specimen Description SPUTUM   Final   Special Requests NONE   Final   Sputum evaluation     Final   Value: MICROSCOPIC FINDINGS SUGGEST THAT THIS SPECIMEN IS NOT REPRESENTATIVE OF LOWER RESPIRATORY SECRETIONS. PLEASE RECOLLECT.     CALLED TO MERLINI,S RN 04/10/2013 0336 JORDANS   Report Status 04/10/2013 FINAL   Final  AFB CULTURE WITH SMEAR     Status: None   Collection Time    04/10/13 11:00 AM      Result Value Range Status   Specimen Description SPUTUM   Final   Special Requests NONE   Final   ACID FAST SMEAR     Final   Value: NO ACID FAST BACILLI SEEN     Performed at [facility]   Culture     Final   Value: CULTURE WILL BE EXAMINED FOR 6 WEEKS BEFORE ISSUING A FINAL REPORT     Performed at [facility]   Report Status PENDING   Incomplete  AFB CULTURE WITH SMEAR     Status: None   Collection Time    04/11/13 11:42 AM      Result Value Range Status   Specimen Description SPUTUM   Final   Special Requests Immunocompromised   Final   ACID FAST SMEAR     Final   Value: NO ACID FAST  BACILLI SEEN     Performed at [facility]   Culture     Final   Value: CULTURE WILL BE EXAMINED FOR 6 WEEKS BEFORE ISSUING A FINAL REPORT     Performed at [facility]   Report Status PENDING   Incomplete  AFB CULTURE WITH SMEAR     Status: None   Collection Time    04/12/13  3:04 PM      Result Value Range Status   Specimen Description NASOPHARYNGEAL   Final   Special Requests Immunocompromised   Final   ACID FAST SMEAR     Final   Value: NO ACID FAST BACILLI SEEN     Performed at [facility]   Culture     Final   Value: CULTURE WILL BE EXAMINED FOR 6 WEEKS BEFORE ISSUING A FINAL REPORT     Performed at [facility]   Report Status PENDING   Incomplete  AFB CULTURE WITH SMEAR     Status: None   Collection Time    04/13/13  5:40 PM      Result Value Range Status   Specimen Description BRONCHIAL ALVEOLAR LAVAGE   Final   Special Requests Immunocompromised   Final   ACID FAST SMEAR     Final   Value: NO ACID FAST BACILLI SEEN     Performed at [facility]   Culture     Final   Value: CULTURE WILL BE EXAMINED FOR 6 WEEKS BEFORE ISSUING A FINAL REPORT     Performed at [facility]   Report Status PENDING   Incomplete  FUNGUS CULTURE W SMEAR     Status: None   Collection Time    04/13/13  5:40 PM      Result Value Range Status   Specimen Description BRONCHIAL ALVEOLAR LAVAGE   Final   Special Requests Immunocompromised   Final   Fungal Smear     Final   Value: RARE YEAST     Performed at  [facility]     Final   Value: CANDIDA ALBICANS     Performed at [facility]   Report Status PENDING   Incomplete  LEGIONELLA CULTURE     Status: None   Collection Time    04/13/13  5:40 PM      Result Value Range Status   Specimen Description BRONCHIAL ALVEOLAR LAVAGE   Final   Special Requests Immunocompromised   Final   Culture     Final   Value: NO LEGIONELLA ISOLATED     Performed at [facility]    Report Status 04/19/2013 FINAL   Final  PNEUMOCYSTIS JIROVECI SMEAR BY DFA     Status: None   Collection Time    04/13/13  5:40 PM      Result Value Range Status   Specimen Source-PJSRC BRONCHIAL ALVEOLAR LAVAGE   Final   Pneumocystis jiroveci Ag NEGATIVE   Final   Comment: Performed at [facility]  RESPIRATORY VIRUS PANEL     Status: None   Collection Time    04/13/13  5:40 PM      Result Value Range Status   Source - RVPAN BRONCHIAL ALVEOLAR LAVAGE   Corrected   Comment: CORRECTED ON 08/19 AT 1841: PREVIOUSLY REPORTED AS BRONCHIAL ALVEOLAR LAVAGE   Respiratory Syncytial Virus A NOT DETECTED   Final   Respiratory Syncytial Virus B NOT DETECTED   Final   Influenza A NOT DETECTED   Final   Influenza B NOT DETECTED   Final   Parainfluenza 1 NOT DETECTED   Final   Parainfluenza 2 NOT DETECTED   Final   Parainfluenza 3 NOT DETECTED   Final   Metapneumovirus NOT DETECTED   Final   Rhinovirus NOT DETECTED   Final   Adenovirus NOT DETECTED   Final   Influenza A H1 NOT DETECTED   Final   Influenza A H3 NOT DETECTED   Final   Comment: (NOTE)           Normal Reference Range for each Analyte: NOT DETECTED     Testing performed using the Luminex xTAG Respiratory Viral Panel test     kit.     This test was developed and its performance characteristics determined     by [facility]. It has not been cleared or approved by the [place]     Food and Drug Administration. This test is used for clinical purposes.     It should not be regarded as investigational or for research. This     laboratory is certified under the Clinical Laboratory Improvement     Amendments of 1988 (CLIA) as qualified to perform high complexity     clinical laboratory testing.     Performed at [facility]  M. TUBERCULOSIS COMPLEX BY PCR     Status: None   Collection Time    04/13/13  5:40 PM      Result Value Range Status   M. tuberculosis, Direct Not detected  Not detected Final    Comment: (NOTE)     This test(s) was developed and its performance characteristics      have been determined by [facility] [employer] [facility],      [place], [place]. Performance characteristics refer to the analytical      performance of the test.     Performed at [facility]   Source (MTBPCR) BRONCHIAL ALVEOLAR LAVAGE   Final  CULTURE, BAL-QUANTITATIVE     Status: None   Collection Time    04/13/13  5:40 PM      Result Value Range Status   Specimen Description BRONCHIAL ALVEOLAR LAVAGE   Final   Special Requests NONE   Final   Gram Stain     Final   Value: RARE WBC PRESENT, PREDOMINANTLY PMN     NO SQUAMOUS EPITHELIAL CELLS SEEN     NO ORGANISMS SEEN     Performed at [facility]     Final   Value: 50,000 COLONIES/ML     Performed at [facility]   Culture     Final   Value: CANDIDA ALBICANS     Performed at [facility]   Report Status 04/16/2013 FINAL   Final  CLOSTRIDIUM DIFFICILE BY PCR     Status: None   Collection Time    04/14/13  2:26 PM      Result Value Range Status   C difficile by pcr NEGATIVE  NEGATIVE Final  FUNGUS CULTURE W SMEAR     Status: None   Collection Time    04/17/13 10:35 AM      Result Value Range Status   Specimen Description TISSUE LUNG LEFT   Final   Special Requests CT CORE LEFT LUNG ABSCESS   Final   Fungal Smear     Final   Value: NO YEAST OR FUNGAL ELEMENTS SEEN     Performed at [facility]   Culture     Final   Value: CULTURE IN PROGRESS FOR FOUR WEEKS     Performed at [facility]   Report Status PENDING   Incomplete  TISSUE CULTURE     Status: None   Collection Time    04/17/13 10:35 AM      Result Value Range Status   Specimen Description TISSUE LUNG LEFT   Final   Special Requests CT CORE LEFT LUNG ABCESS   Final   Gram Stain     Final   Value: NO WBC SEEN     NO ORGANISMS SEEN     Performed at [facility]   Culture     Final   Value: NO  GROWTH 3 DAYS     Performed at [facility]   Report Status 04/20/2013 FINAL   Final  AFB CULTURE WITH SMEAR     Status: None   Collection Time    04/17/13 10:36 AM      Result Value Range Status   Specimen Description TISSUE LUNG LEFT   Final   Special Requests CT CORE LEFT LUNG ABSCESS   Final   ACID FAST SMEAR     Final   Value: NO ACID FAST BACILLI SEEN     Performed at [facility]   Culture     Final   Value: CULTURE WILL BE EXAMINED FOR 6 WEEKS BEFORE ISSUING A FINAL REPORT     Performed at [facility]   Report Status PENDING   Incomplete    Medical History: Past Medical History  Diagnosis Date  . Tonsillitis   . Hypertension   . TB (tuberculosis)     Assessment: Patient is a 42 y.o F with HIV, active TB, PNA with LUL abscess (per chest CT).  She was discharged on 8/27 with IV vancomycin and Meropenem with plan to treat lung abscess for at least 4 weeks.  She presented  to the ED today secondary to PICC line occlusion.  Plan:  1) Merrem 1gm IV q8h  Monique [name] P 04/23/2013,5:50 PM

## 2013-04-23 NOTE — ED Notes (Signed)
Pic line heparin locked by IV team.

## 2013-04-23 NOTE — ED Notes (Signed)
Pt requesting to take medication that she could not get [facility].  Pt reports she was unable to fill ethambutol, pyrazinamide or rifabutin yesterday.

## 2013-04-23 NOTE — ED Notes (Signed)
IV team RN at bedside.  

## 2013-04-23 NOTE — ED Notes (Signed)
Pt discharged.Vital signs stable and GCS 15.Advise pt to follow up with case manger in the morning.

## 2013-04-23 NOTE — Progress Notes (Signed)
Pt PICC line is clotted showing blood in the tube almost to the hub. Unable to flush the line with saline, tPA ordered. Consuello Masse

## 2013-04-23 NOTE — ED Notes (Signed)
Pt reports picc line right arm is clotted since last night. Has picc line for antibiotics. No acute distress noted at triage.

## 2013-04-23 NOTE — ED Notes (Signed)
IV team RN back at bedside.

## 2013-04-23 NOTE — ED Provider Notes (Signed)
CSN: [id number]     Arrival date & time 04/23/13  1026 History   First MD Initiated Contact with Patient 04/23/13 1109     Chief Complaint  Patient presents with  . Vascular Access Problem   (Consider location/radiation/quality/duration/timing/severity/associated sxs/prior Treatment) HPI Comments: Patient is a 42 year old female who presents with problems with her PICC line. Patient has a PICC line in her right arm and states no medication could be administered by her home health nurse because the line is clotted. This was discovered last night and did not improve by this morning. Patient was told to come to the ED. Patient denies any pain or irritation around the site or in her right arm. No aggravating/alleviating factors. No associated symptoms.   Past Medical History  Diagnosis Date  . Tonsillitis   . Hypertension   . TB (tuberculosis)    Past Surgical History  Procedure Laterality Date  . Cesarean section    . Video bronchoscopy Bilateral 04/13/2013    Procedure: VIDEO BRONCHOSCOPY WITHOUT FLUORO;  Surgeon: Kalman Shan, MD;  Location: [facility];  Service: Cardiopulmonary;  Laterality: Bilateral;   History reviewed. No pertinent family history. History  Substance Use Topics  . Smoking status: Never Smoker   . Smokeless tobacco: Not on file  . Alcohol Use: No   OB History   Grav Para Term Preterm Abortions TAB SAB Ect Mult Living                 Review of Systems  Constitutional:       PICC line clotted  All other systems reviewed and are negative.    Allergies  Review of patient's allergies indicates no known allergies.  Home Medications   Current Outpatient Rx  Name  Route  Sig  Dispense  Refill  . chlorpheniramine-HYDROcodone (TUSSIONEX) 10-8 MG/5ML LQCR   Oral   Take 5 mLs by mouth every 12 (twelve) hours as needed.   100 mL   0   . dextromethorphan (DELSYM) 30 MG/5ML liquid   Oral   Take 5 mLs (30 mg total) by mouth 2 (two) times daily.   89  mL   0   . fluconazole (DIFLUCAN) 100 MG tablet   Oral   Take 1 tablet (100 mg total) by mouth daily.   6 tablet   0   . isoniazid (NYDRAZID) 300 MG tablet   Oral   Take 1 tablet (300 mg total) by mouth daily.   30 tablet   3   . Multiple Vitamin (MULTIVITAMIN WITH MINERALS) TABS tablet   Oral   Take 1 tablet by mouth daily.         Marland Kitchen oxyCODONE (OXY IR/ROXICODONE) 5 MG immediate release tablet   Oral   Take 1 tablet (5 mg total) by mouth every 4 (four) hours as needed.   30 tablet   0   . pyridOXINE (B-6) 50 MG tablet   Oral   Take 1 tablet (50 mg total) by mouth daily.   30 tablet   3   . sodium chloride 0.9 % SOLN 100 mL with meropenem 1 G SOLR 1 g   Intravenous   Inject 1 g into the vein every 8 (eight) hours.   90 each   0   . sulfamethoxazole-trimethoprim (BACTRIM DS) 800-160 MG per tablet   Oral   Take 1 tablet by mouth daily.   30 tablet   3   . vancomycin (VANCOCIN) 1 GM/200ML SOLN  Intravenous   Inject 200 mLs (1,000 mg total) into the vein every 12 (twelve) hours.   4000 mL   3   . benazepril-hydrochlorthiazide (LOTENSIN HCT) 20-12.5 MG per tablet   Oral   Take 1 tablet by mouth daily.   30 tablet   2   . ethambutol (MYAMBUTOL) 400 MG tablet   Oral   Take 3 tablets (1,200 mg total) by mouth daily.   90 tablet   2   . pyrazinamide 500 MG tablet   Oral   Take 3 tablets (1,500 mg total) by mouth daily.   90 tablet   2   . rifabutin (MYCOBUTIN) 150 MG capsule   Oral   Take 2 capsules (300 mg total) by mouth daily.   60 capsule   3    BP 122/88  Pulse 101  Temp(Src) 98.5 F (36.9 C) (Oral)  Resp 18  Wt 175 lb (79.379 kg)  BMI 32 kg/m2  SpO2 98%  LMP 03/09/2013 Physical Exam  Nursing note and vitals reviewed. Constitutional: She is oriented to person, place, and time. She appears well-developed and well-nourished. No distress.  HENT:  Head: Normocephalic and atraumatic.  Eyes: Conjunctivae and EOM are normal.  Neck:  Normal range of motion.  Cardiovascular: Normal rate and regular rhythm.  Exam reveals no gallop and no friction rub.   No murmur heard. Pulmonary/Chest: Effort normal and breath sounds normal. She has no wheezes. She has no rales. She exhibits no tenderness.  Abdominal: Soft. She exhibits no distension. There is no tenderness. There is no rebound and no guarding.  Musculoskeletal: Normal range of motion.  Neurological: She is alert and oriented to person, place, and time. Coordination normal.  Speech is goal-oriented. Moves limbs without ataxia.   Skin: Skin is warm and dry.  Psychiatric: She has a normal mood and affect. Her behavior is normal.    ED Course  Procedures (including critical care time) Labs Review Labs Reviewed - No data to display Imaging Review No results found.  MDM   1. Occlusion of peripherally inserted central catheter (PICC) line, initial encounter     12:19 PM IV team will administer TPA to unclog the PICC team. Vitals stable and patient afebrile. Patient is also requesting her TB medications to have here since she is due to take them now. I will order ethambutol, pyrazinamide, and rifabutin.   7:22 PM Patient's PICC line is unclogged and patient is able to receive medication. Patient is receiving her 2 antibiotics and then she will be discharged without further evaluation. Patient will receive her Vancomycin and Meropenem and be discharged.    Emilia Beck, PA-C 04/23/13 2007

## 2013-04-23 NOTE — ED Provider Notes (Signed)
Medical screening examination/treatment/procedure(s) were performed by non-physician practitioner and as supervising physician I was immediately available for consultation/collaboration.  Layla Maw Ward, DO 04/23/13 2014

## 2013-04-23 NOTE — Telephone Encounter (Signed)
[name], I have written e scripts in epic, can we print them and send for her ADAP application, Monique [name] tellsme that my NOT having given the scripts was the real reason for delay in ADAp application--whichis my bad but [name] been ultra busy  This regimen would have zero interaction with her tb drugs but should nto be started until she is at least 2 weeks into tb treatment (this coming Tuesday)

## 2013-04-23 NOTE — Progress Notes (Signed)
Per Raynelle Fanning RN, the tPA has not been sent to ED. Consuello Masse

## 2013-04-23 NOTE — ED Notes (Addendum)
Pt presents with report of clotted PICC line to R upper arm.  Pt reports receiving antibiotics last night, reports nurse was unable to flush either port this morning.  No swelling or redness noted, pt denies any pain. Blood noted in tubing at insertion site.

## 2013-04-23 NOTE — Telephone Encounter (Signed)
Thanks [name]. 

## 2013-04-23 NOTE — ED Notes (Signed)
IV team contacted. 

## 2013-04-23 NOTE — Telephone Encounter (Signed)
Yes, I can print them.

## 2013-04-23 NOTE — ED Notes (Signed)
Dinner tray ordered.

## 2013-04-24 ENCOUNTER — Telehealth: Payer: Self-pay | Admitting: *Deleted

## 2013-04-24 NOTE — Telephone Encounter (Signed)
Pt unable to receive [facility] [address]:  Ethambutol out-of-stock and rifabutin not covered by pt's current insurance. Was able to obtain other TB rxes. Left message for TB Program to call [facility] about medication assistance for pt with TB diagnosis.

## 2013-04-24 NOTE — Progress Notes (Addendum)
ED CM received a call from [facility] CM staff pt about pt voicing concerns about medications.   CM attempted to call pt at [phone] home and [phone] mobile Both Numbers are no longer in service and pt did not leave a number with [facility] CM staff . Pend a possible return call from pt  1110 ED Cm reviewed AVS, and unit CM note from 04/22/13 indicating the pt was d/c with a [facility] MATCH letter to cover her TB Medications This letter should will expire in 5 days

## 2013-04-24 NOTE — Telephone Encounter (Signed)
[facility] providing rxes/Direct Observed Therapy.

## 2013-04-28 ENCOUNTER — Other Ambulatory Visit: Payer: Self-pay | Admitting: Licensed Clinical Social Worker

## 2013-04-28 DIAGNOSIS — B20 Human immunodeficiency virus [HIV] disease: Secondary | ICD-10-CM

## 2013-04-28 MED ORDER — DOLUTEGRAVIR SODIUM 50 MG PO TABS: 50.0000 mg | ORAL_TABLET | Freq: Every day | ORAL | Status: DC

## 2013-04-28 MED ORDER — ABACAVIR SULFATE-LAMIVUDINE 600-300 MG PO TABS: 1.0000 | ORAL_TABLET | Freq: Every day | ORAL | Status: DC

## 2013-04-28 NOTE — Telephone Encounter (Signed)
rx is printed

## 2013-04-28 NOTE — Telephone Encounter (Signed)
She doesn't need to fill these they are supposed tobe brought to her

## 2013-04-29 ENCOUNTER — Ambulatory Visit (INDEPENDENT_AMBULATORY_CARE_PROVIDER_SITE_OTHER): Payer: Self-pay | Admitting: Infectious Disease

## 2013-04-29 ENCOUNTER — Ambulatory Visit (HOSPITAL_COMMUNITY)
Admission: RE | Admit: 2013-04-29 | Discharge: 2013-04-29 | Disposition: A | Discharge status: Home / Self Care | Payer: Self-pay | Source: Ambulatory Visit | Attending: Infectious Disease | Admitting: Infectious Disease

## 2013-04-29 ENCOUNTER — Encounter: Payer: Self-pay | Admitting: Infectious Disease

## 2013-04-29 VITALS — BP 120/84 | HR 106 | Temp 99.1°F | Wt 166.0 lb

## 2013-04-29 DIAGNOSIS — B20 Human immunodeficiency virus [HIV] disease: Secondary | ICD-10-CM

## 2013-04-29 DIAGNOSIS — J852 Abscess of lung without pneumonia: Secondary | ICD-10-CM | POA: Insufficient documentation

## 2013-04-29 DIAGNOSIS — J851 Abscess of lung with pneumonia: Secondary | ICD-10-CM

## 2013-04-29 DIAGNOSIS — J189 Pneumonia, unspecified organism: Secondary | ICD-10-CM | POA: Insufficient documentation

## 2013-04-29 DIAGNOSIS — A150 Tuberculosis of lung: Secondary | ICD-10-CM

## 2013-04-29 DIAGNOSIS — Z23 Encounter for immunization: Secondary | ICD-10-CM

## 2013-04-29 MED ORDER — AZITHROMYCIN 600 MG PO TABS: 1200.0000 mg | ORAL_TABLET | ORAL | Status: DC

## 2013-04-29 NOTE — Patient Instructions (Addendum)
rtc on the 18th at 11pm  Your HIV medicines for now are:  Epzicom (orange pill) once in the morning WITH  Isentress (pink pill) in the morning AND ALSO IN THE EVENING (TWICE A DAY)  YOU ALSO NEED TO TAKE BACTRIM DS TABLET (WHITE CHALKY PILL) ONCE A DAY TO PREVENT PCP PNEUMONIA  MAKE SURE YOU HAVE THE BACTRIM IF YOU DO NOT HAVE IT LET us KNOW AND THEN START THE DAPSONE 100MG  SAMPLES EVERY DAY  YOU ARE ALSO GOING TO NEED AZITHROMYCIN TWO 600MG  TABLETS ONCE A WEEK TO PREVEN MAC INFECTION  WHEN ADAP IS APPROVED WE WILL RESTRUCTURE YOUR HIV MEDICINES

## 2013-04-29 NOTE — Progress Notes (Signed)
Subjective:    Patient ID: Monique [name], female    DOB: 04-Aug-1971, 42 y.o.   MRN: [id number]  HPI  42 year old Afro-American lady with newly diagnosed HIV AIDS also with lung abscess due to either bacteria or tuberculosis currently being treated for both with IV antibiotics and for drug therapy via the [facility] department.  She underwent bronchoscopy with BAL 3-4 days into vanco/unsasyn and this failed to yield any bacterial organisms, AFB stain was negative and cultures negative to date. Respiratory panel was negative PCP smear was negative. Some Candida albicans did grow from bronchoalveolar lavage. She also multiple sputum sent for AFB stain and culture these were negative. Bronchial V. lavage was sent for Mycobacterium tuberculosis by PCR testing and this was negative by PCR testing. She then had a lung biopsy performed by interventional radiology and specimens were sent to pathology and to [facility]  Report showed nonspecific inflammation but no granulomas and no evidence of malignancy. Cultures were and negative AFB stains and fungal stains were negative and no growth to date. She was sent home via [facility] care with advanced home care home antibiotics with IV vancomycin being dosed for trough of 15-20 along with meropenem. She is taking Bactrim for PCP prophylaxis has been getting for drug therapy for presumed pulmonary tuberculosis via the health department with directly observed therapy. She has not yet been started on antiviral therapy given at one and to delay her is to show antiretroviral therapy for 2 weeks. She is awaiting a AIDS drug assistance program approval.  She feels better than she was in the hospital is no longer being oxygen.   Review of Systems  Constitutional: Negative for fever, chills, diaphoresis, activity change, appetite change, fatigue and unexpected weight change.  HENT: Negative for congestion, sore throat, rhinorrhea, sneezing, trouble  swallowing and sinus pressure.   Eyes: Negative for photophobia and visual disturbance.  Respiratory: Positive for cough and shortness of breath. Negative for chest tightness, wheezing and stridor.   Cardiovascular: Positive for chest pain. Negative for palpitations and leg swelling.  Gastrointestinal: Negative for nausea, vomiting, abdominal pain, diarrhea, constipation, blood in stool, abdominal distention and anal bleeding.  Genitourinary: Negative for dysuria, hematuria, flank pain and difficulty urinating.  Musculoskeletal: Negative for myalgias, back pain, joint swelling, arthralgias and gait problem.  Skin: Negative for color change, pallor, rash and wound.  Neurological: Negative for dizziness, tremors, weakness and light-headedness.  Hematological: Negative for adenopathy. Does not bruise/bleed easily.  Psychiatric/Behavioral: Negative for behavioral problems, confusion, sleep disturbance, dysphoric mood, decreased concentration and agitation.       Objective:   Physical Exam  Constitutional: She is oriented to person, place, and time. She appears well-developed and well-nourished. No distress.  HENT:  Head: Normocephalic and atraumatic.  Mouth/Throat: Oropharynx is clear and moist. No oropharyngeal exudate.  Eyes: Conjunctivae and EOM are normal. Pupils are equal, round, and reactive to light. No scleral icterus.  Neck: Normal range of motion. Neck supple. No JVD present.  Cardiovascular: Normal rate, regular rhythm and normal heart sounds.  Exam reveals no gallop and no friction rub.   No murmur heard. Pulmonary/Chest: Effort normal. No respiratory distress. She has decreased breath sounds in the left middle field and the left lower field. She has no wheezes.  Abdominal: She exhibits no distension and no mass. There is no tenderness. There is no rebound and no guarding.  Musculoskeletal: She exhibits no edema and no tenderness.  Lymphadenopathy:    She has no  cervical  adenopathy.  Neurological: She is alert and oriented to person, place, and time. She has normal reflexes. She exhibits normal muscle tone. Coordination normal.  Skin: Skin is warm and dry. She is not diaphoretic. No erythema. No pallor.  Psychiatric: She has a normal mood and affect. Her behavior is normal. Judgment and thought content normal.          Assessment & Plan:  HIV/AIDS: I gave her samples of Isentress BID and epzicom daily and filled pill box with these. Plan when ADAP goes into place would be to have her change to Tivicay/Epzicom vs Triumeq when ADAP and [employer] negotiate pricing. She believes she has Bactrim but if she does not have given her some dapsone to take on with her. She will also still need to take azithromycin weekly as well.  I spent greater than 45 minutes with the patient including greater than 50% of time in face to face counsel of the patient and in coordination of their care.   Lung abscess due to either tuberculosis or bacterial pathogens: --Echo followup chest x-ray today which shows an improvement in the consolidation. --Continue IV vancomycin and IV meropenem --Continue 4 drug therapy for tuberculosis --Followup in our clinic in 2 weeks' time

## 2013-05-03 ENCOUNTER — Emergency Department (HOSPITAL_COMMUNITY)
Admission: EM | Admit: 2013-05-03 | Discharge: 2013-05-03 | Disposition: A | Discharge status: Home / Self Care | Payer: Self-pay | Attending: Emergency Medicine | Admitting: Emergency Medicine

## 2013-05-03 ENCOUNTER — Encounter (HOSPITAL_COMMUNITY): Payer: Self-pay | Admitting: *Deleted

## 2013-05-03 DIAGNOSIS — Z8611 Personal history of tuberculosis: Secondary | ICD-10-CM | POA: Insufficient documentation

## 2013-05-03 DIAGNOSIS — T82898A Other specified complication of vascular prosthetic devices, implants and grafts, initial encounter: Secondary | ICD-10-CM

## 2013-05-03 DIAGNOSIS — I10 Essential (primary) hypertension: Secondary | ICD-10-CM | POA: Insufficient documentation

## 2013-05-03 DIAGNOSIS — Z79899 Other long term (current) drug therapy: Secondary | ICD-10-CM | POA: Insufficient documentation

## 2013-05-03 DIAGNOSIS — T80218A Other infection due to central venous catheter, initial encounter: Secondary | ICD-10-CM | POA: Insufficient documentation

## 2013-05-03 DIAGNOSIS — Y838 Other surgical procedures as the cause of abnormal reaction of the patient, or of later complication, without mention of misadventure at the time of the procedure: Secondary | ICD-10-CM | POA: Insufficient documentation

## 2013-05-03 DIAGNOSIS — Z792 Long term (current) use of antibiotics: Secondary | ICD-10-CM | POA: Insufficient documentation

## 2013-05-03 DIAGNOSIS — Z8709 Personal history of other diseases of the respiratory system: Secondary | ICD-10-CM | POA: Insufficient documentation

## 2013-05-03 MED ORDER — HYDROCOD POLST-CHLORPHEN POLST 10-8 MG/5ML PO LQCR
5.0000 mL | Freq: Once | ORAL | Status: AC
  Administered 2013-05-03: 5 mL via ORAL
  Filled 2013-05-03: qty 5

## 2013-05-03 MED ORDER — HYDROCOD POLST-CHLORPHEN POLST 10-8 MG/5ML PO LQCR: 5.0000 mL | Freq: Two times a day (BID) | ORAL | Status: DC | PRN

## 2013-05-03 MED ORDER — HEPARIN SOD (PORK) LOCK FLUSH 100 UNIT/ML IV SOLN: 250.0000 [IU] | INTRAVENOUS | Status: DC | PRN

## 2013-05-03 MED ORDER — ALTEPLASE 2 MG IJ SOLR
2.0000 mg | Freq: Once | INTRAMUSCULAR | Status: AC
  Administered 2013-05-03: 2 mg
  Filled 2013-05-03: qty 2

## 2013-05-03 NOTE — ED Notes (Signed)
Deliah Boston, PA at the bedside.

## 2013-05-03 NOTE — ED Provider Notes (Signed)
CSN: [id number]     Arrival date & time 05/03/13  1636 History   First MD Initiated Contact with Patient 05/03/13 1746     Chief Complaint  Patient presents with  . picc line clogged    (Consider location/radiation/quality/duration/timing/severity/associated sxs/prior Treatment) HPI Comments: Pt is here because her picc line is not drawing back blood:pt states that she is having no problems flushing it but she isn't getting a blood return:pt states that she is getting antibiotics at home and was told not to use the line until she came in and had it evaluated  The history is provided by the patient. No language interpreter was used.    Past Medical History  Diagnosis Date  . Tonsillitis   . Hypertension   . TB (tuberculosis)    Past Surgical History  Procedure Laterality Date  . Cesarean section    . Video bronchoscopy Bilateral 04/13/2013    Procedure: VIDEO BRONCHOSCOPY WITHOUT FLUORO;  Surgeon: Kalman Shan, MD;  Location: [facility] ENDOSCOPY;  Service: Cardiopulmonary;  Laterality: Bilateral;   No family history on file. History  Substance Use Topics  . Smoking status: Never Smoker   . Smokeless tobacco: Not on file  . Alcohol Use: No   OB History   Grav Para Term Preterm Abortions TAB SAB Ect Mult Living                 Review of Systems  Constitutional: Negative.   Respiratory: Negative.   Cardiovascular: Negative.     Allergies  Review of patient's allergies indicates no known allergies.  Home Medications   Current Outpatient Rx  Name  Route  Sig  Dispense  Refill  . abacavir-lamiVUDine (EPZICOM) 600-300 MG per tablet   Oral   Take 1 tablet by mouth daily.   30 tablet   0   . azithromycin (ZITHROMAX) 600 MG tablet   Oral   Take 2 tablets (1,200 mg total) by mouth every 7 (seven) days.   10 tablet   5   . benazepril-hydrochlorthiazide (LOTENSIN HCT) 20-12.5 MG per tablet   Oral   Take 1 tablet by mouth daily.   30 tablet   2   .  chlorpheniramine-HYDROcodone (TUSSIONEX) 10-8 MG/5ML LQCR   Oral   Take 5 mLs by mouth every 12 (twelve) hours as needed.   100 mL   0   . dextromethorphan (DELSYM) 30 MG/5ML liquid   Oral   Take 5 mLs (30 mg total) by mouth 2 (two) times daily.   89 mL   0   . dolutegravir (TIVICAY) 50 MG tablet   Oral   Take 1 tablet (50 mg total) by mouth daily.   30 tablet   0   . ethambutol (MYAMBUTOL) 400 MG tablet   Oral   Take 3 tablets (1,200 mg total) by mouth daily.   90 tablet   2   . fluconazole (DIFLUCAN) 100 MG tablet   Oral   Take 1 tablet (100 mg total) by mouth daily.   6 tablet   0   . isoniazid (NYDRAZID) 300 MG tablet   Oral   Take 1 tablet (300 mg total) by mouth daily.   30 tablet   3   . Multiple Vitamin (MULTIVITAMIN WITH MINERALS) TABS tablet   Oral   Take 1 tablet by mouth daily.         Marland Kitchen oxyCODONE (OXY IR/ROXICODONE) 5 MG immediate release tablet   Oral   Take 1 tablet (  5 mg total) by mouth every 4 (four) hours as needed.   30 tablet   0   . pyrazinamide 500 MG tablet   Oral   Take 3 tablets (1,500 mg total) by mouth daily.   90 tablet   2   . pyridOXINE (B-6) 50 MG tablet   Oral   Take 1 tablet (50 mg total) by mouth daily.   30 tablet   3   . rifabutin (MYCOBUTIN) 150 MG capsule   Oral   Take 2 capsules (300 mg total) by mouth daily.   60 capsule   3   . sodium chloride 0.9 % SOLN 100 mL with meropenem 1 G SOLR 1 g   Intravenous   Inject 1 g into the vein every 8 (eight) hours.   90 each   0   . sulfamethoxazole-trimethoprim (BACTRIM DS) 800-160 MG per tablet   Oral   Take 1 tablet by mouth daily.   30 tablet   3   . vancomycin (VANCOCIN) 1 GM/200ML SOLN   Intravenous   Inject 200 mLs (1,000 mg total) into the vein every 12 (twelve) hours.   4000 mL   3    BP 132/92  Pulse 106  Temp(Src) 98.5 F (36.9 C) (Oral)  Resp 18  SpO2 96%  LMP 03/09/2013 Physical Exam  Nursing note and vitals reviewed. Constitutional:  She appears well-developed and well-nourished.  Cardiovascular: Normal rate and regular rhythm.   Pulmonary/Chest: Effort normal and breath sounds normal.    ED Course  Procedures (including critical care time) Labs Review Labs Reviewed - No data to display Imaging Review No results found.  MDM   1. Occlusion of peripherally inserted central catheter (PICC) line, initial encounter    6:34 PM Iv nurse to come and put tpa and then pt will sit for 2 hours 8:09 PM Pt continuing to wait on iv team:pt left with Sharen Hones NP:pt given script for cough    Teressa Lower, NP 05/03/13 2010

## 2013-05-03 NOTE — ED Provider Notes (Signed)
Patient's PICC line was instilled with TPN after 2 hours and flushed freely.  She has been discharged  Arman Filter, NP 05/03/13 2245

## 2013-05-03 NOTE — ED Notes (Signed)
The pt has a picc line in her rt upper arm and since this am it has not worked.  She is getting   2 meds in that site

## 2013-05-03 NOTE — ED Notes (Signed)
Attempted to flush PICC line with NS. Able to flush but unable to draw back blood. IV team paged at this time.

## 2013-05-03 NOTE — ED Provider Notes (Signed)
Medical screening examination/treatment/procedure(s) were performed by non-physician practitioner and as supervising physician I was immediately available for consultation/collaboration.   Shelda Jakes, MD 05/03/13 2017

## 2013-05-03 NOTE — ED Notes (Signed)
IV team at bedside 

## 2013-05-03 NOTE — ED Notes (Signed)
IV team paged to notify them that 2 hours has passed since the Activase was administered.

## 2013-05-03 NOTE — ED Notes (Signed)
IV team returned page- unknown time frame at this time.

## 2013-05-03 NOTE — ED Notes (Signed)
IV nurse instructed me to call her back in 2 hours so that she can check to see if the alteplase worked properly.

## 2013-05-05 NOTE — ED Provider Notes (Signed)
Medical screening examination/treatment/procedure(s) were performed by non-physician practitioner and as supervising physician I was immediately available for consultation/collaboration.  Marguerite Jarboe W. Sheronda Parran, MD 05/05/13 1151 

## 2013-05-10 LAB — FUNGUS CULTURE W SMEAR

## 2013-05-11 ENCOUNTER — Telehealth: Payer: Self-pay | Admitting: Infectious Disease

## 2013-05-11 ENCOUNTER — Telehealth: Payer: Self-pay

## 2013-05-11 NOTE — Telephone Encounter (Addendum)
Pt states she is having low and upper back pain. She is requesting refill of oxycodone.    Please advise.   Laurell Josephs, ?RN

## 2013-05-11 NOTE — Telephone Encounter (Signed)
Thanks [name], I do want her to make the changes that I flagged Monique [name] in note earlier as well  IE stop lotensin  Stop bactrim for now and we need repeat labs in 2 days with creatinine up

## 2013-05-11 NOTE — Telephone Encounter (Signed)
[facility] reported that today's redrawn vanc trough was 49.  Pt's last reported vancomycin dose was 9 pm last night (05/10/13).  Creatinine was 2.67.  Per Dr. Clinton Gallant note, new orders, 1)  Redraw vanc trough and creatinine Wed., 05/13/13.   Stop lotensin.  Stop Bactrim for now.  Possible change in meropenem dosing.  Once creatinine better should restart bactrim three times weekly.  RN spoke with Dr. Daiva Eves.  Dr. Daiva Eves added a vanc trough and creatinine for Tues., Sept 16, 2014 blood draw.  Phone call to [facility].  [facility] reported that the pt has already been notified to stop the noted medications.  [facility] adding blood draw for tomorrow.  Repeated back orders.

## 2013-05-11 NOTE — Telephone Encounter (Signed)
Pt with supratherapeutic vancomycin level of 84.4, creatininee at 2.18  Needs to  #1 hold vancomycin  #2 Stop Lotensin  #3 hold bactrim   [facility] may have to adjust her meropenem as well   Creatinine needs to be repeated by Wed at the latest  Once creatinine better should restart bactrim three times weekly

## 2013-05-11 NOTE — Telephone Encounter (Signed)
Noted.   Advised Pharmacist.

## 2013-05-11 NOTE — Telephone Encounter (Signed)
Pharmacist from [facility] is calling re: Abnormal vancomycin levels.  She states the lab was drawn during infusion.   There will be a redraw and results will be routed to our office. This is in regards to 05-11-13 note.   Laurell Josephs, RN

## 2013-05-12 ENCOUNTER — Encounter: Payer: Self-pay | Admitting: Infectious Disease

## 2013-05-12 ENCOUNTER — Telehealth: Payer: Self-pay | Admitting: Licensed Clinical Social Worker

## 2013-05-12 ENCOUNTER — Ambulatory Visit (HOSPITAL_COMMUNITY)
Admission: RE | Admit: 2013-05-12 | Discharge: 2013-05-12 | Disposition: A | Discharge status: Home / Self Care | Payer: Self-pay | Source: Ambulatory Visit | Attending: Infectious Disease | Admitting: Infectious Disease

## 2013-05-12 ENCOUNTER — Ambulatory Visit (INDEPENDENT_AMBULATORY_CARE_PROVIDER_SITE_OTHER): Payer: Self-pay | Admitting: Infectious Disease

## 2013-05-12 VITALS — BP 100/67 | HR 116 | Temp 98.2°F | Ht 64.0 in | Wt 165.0 lb

## 2013-05-12 DIAGNOSIS — R509 Fever, unspecified: Secondary | ICD-10-CM

## 2013-05-12 DIAGNOSIS — B20 Human immunodeficiency virus [HIV] disease: Secondary | ICD-10-CM

## 2013-05-12 DIAGNOSIS — R05 Cough: Secondary | ICD-10-CM

## 2013-05-12 DIAGNOSIS — Z227 Latent tuberculosis: Secondary | ICD-10-CM

## 2013-05-12 DIAGNOSIS — J984 Other disorders of lung: Secondary | ICD-10-CM | POA: Insufficient documentation

## 2013-05-12 DIAGNOSIS — J852 Abscess of lung without pneumonia: Secondary | ICD-10-CM

## 2013-05-12 DIAGNOSIS — A150 Tuberculosis of lung: Secondary | ICD-10-CM

## 2013-05-12 DIAGNOSIS — N289 Disorder of kidney and ureter, unspecified: Secondary | ICD-10-CM

## 2013-05-12 DIAGNOSIS — R059 Cough, unspecified: Secondary | ICD-10-CM | POA: Insufficient documentation

## 2013-05-12 MED ORDER — AZITHROMYCIN 600 MG PO TABS: 1200.0000 mg | ORAL_TABLET | ORAL | Status: DC

## 2013-05-12 MED ORDER — HYDROCODONE-HOMATROPINE 5-1.5 MG/5ML PO SYRP: 5.0000 mL | ORAL_SOLUTION | Freq: Four times a day (QID) | ORAL | Status: DC | PRN

## 2013-05-12 MED ORDER — ABACAVIR SULFATE-LAMIVUDINE 600-300 MG PO TABS: 1.0000 | ORAL_TABLET | Freq: Every day | ORAL | Status: DC

## 2013-05-12 MED ORDER — DOLUTEGRAVIR SODIUM 50 MG PO TABS: 50.0000 mg | ORAL_TABLET | Freq: Every day | ORAL | Status: DC

## 2013-05-12 MED ORDER — DOLUTEGRAVIR SODIUM 50 MG PO TABS
50.0000 mg | ORAL_TABLET | Freq: Every day | ORAL | Status: DC
Start: 2013-05-12 — End: 2013-06-05

## 2013-05-12 NOTE — Progress Notes (Signed)
Subjective:    Patient ID: Monique [name], female    DOB: 1970-09-25, 42 y.o.   MRN: [id number]  Fever  This is a new problem. The current episode started in the past 7 days. The problem occurs daily. The problem has been gradually worsening. The maximum temperature noted was 103 to 103.9 F. The temperature was taken using an axillary reading. Associated symptoms include chest pain and coughing. Pertinent negatives include no abdominal pain, congestion, diarrhea, nausea, rash, sore throat, vomiting or wheezing. She has tried acetaminophen and NSAIDs for the symptoms. The treatment provided moderate relief.    42 year old Afro-American lady with newly diagnosed HIV AIDS also with lung abscess due to either bacteria or tuberculosis currently being treated for both with IV antibiotics and for drug therapy via the [facility] department.  She underwent bronchoscopy with BAL 3-4 days into vanco/unsasyn and this failed to yield any bacterial organisms, AFB stain was negative and cultures negative to date. Respiratory panel was negative PCP smear was negative. Some Candida albicans did grow from bronchoalveolar lavage. She also multiple sputum sent for AFB stain and culture these were negative. Bronchial V. lavage was sent for Mycobacterium tuberculosis by PCR testing and this was negative by PCR testing. She then had a lung biopsy performed by interventional radiology and specimens were sent to pathology and to microbiology.  Report showed nonspecific inflammation but no granulomas and no evidence of malignancy.   Cultures were and negative AFB stains and fungal stains were negative and no growth to date. [facility] with [facility] home antibiotics with IV vancomycin being dosed for trough of 15-20 along with meropenem.   In the past week she began having fevers up to 103 with cough and chest pain. She has not had irritation around her PICC line she has not  abdominal pain nausea or vomiting. During this time her vancomycin levels have been supratherapeutic with one measured at 84. Vancomycin has been held and levels are coming down. Her serum creatinine also increased above 2 in the clinic on that to 2.3. In addition to stopping her vancomycin we asked her stop her Lotensin as well as her Bactrim. She did take ibuprofen today after having a fever up to 103. I've asked her to not use that either.  At her last clinic visit we gave her samples for Isentress she's been taking twice daily as well as Epzicom which she's been taking once daily. I've written prescriptions for TIVICAY and Epzicom and finally today they have been approved via the AIDS drug assistance program.  Will plan on checking a followup chest x-ray today complaining labs from [place] [facility] care and checking blood cultures in 2 sites to make sure she does not have a bacteremia due to her PICC line.      Review of Systems  Constitutional: Positive for fever. Negative for chills, diaphoresis, activity change, appetite change, fatigue and unexpected weight change.  HENT: Negative for congestion, sore throat, rhinorrhea, sneezing, trouble swallowing and sinus pressure.   Eyes: Negative for photophobia and visual disturbance.  Respiratory: Positive for cough and shortness of breath. Negative for chest tightness, wheezing and stridor.   Cardiovascular: Positive for chest pain. Negative for palpitations and leg swelling.  Gastrointestinal: Negative for nausea, vomiting, abdominal pain, diarrhea, constipation, blood in stool, abdominal distention and anal bleeding.  Genitourinary: Negative for dysuria, hematuria, flank pain and difficulty urinating.  Musculoskeletal: Negative for myalgias, back pain, joint swelling,  arthralgias and gait problem.  Skin: Negative for color change, pallor, rash and wound.  Neurological: Negative for dizziness, tremors, weakness and light-headedness.  Hematological:  Negative for adenopathy. Does not bruise/bleed easily.  Psychiatric/Behavioral: Negative for behavioral problems, confusion, sleep disturbance, dysphoric mood, decreased concentration and agitation.       Objective:   Physical Exam  Constitutional: She is oriented to person, place, and time. She appears well-developed and well-nourished. No distress.  HENT:  Head: Normocephalic and atraumatic.  Mouth/Throat: Oropharynx is clear and moist. No oropharyngeal exudate.  Eyes: Conjunctivae and EOM are normal. Pupils are equal, round, and reactive to light. No scleral icterus.  Neck: Normal range of motion. Neck supple. No JVD present.  Cardiovascular: Normal rate, regular rhythm and normal heart sounds.  Exam reveals no gallop and no friction rub.   No murmur heard. Pulmonary/Chest: Effort normal. No respiratory distress. She has decreased breath sounds in the left middle field and the left lower field. She has no wheezes.  Abdominal: She exhibits no distension and no mass. There is no tenderness. There is no rebound and no guarding.  Musculoskeletal: She exhibits no edema and no tenderness.  Lymphadenopathy:    She has no cervical adenopathy.  Neurological: She is alert and oriented to person, place, and time. She has normal reflexes. She exhibits normal muscle tone. Coordination normal.  Skin: Skin is warm and dry. She is not diaphoretic. No erythema. No pallor.  Psychiatric: She has a normal mood and affect. Her behavior is normal. Judgment and thought content normal.          Assessment & Plan:  HIV/AIDS: ADAP approved today -switch to [place] and Epzicom --Continue azithromycin for my to Bactrim avium prophylaxis  --Hold Bactrim for now until her creatinine stabilizes and then go to 3 times weekly Bactrim. --Recheck viral load and CD4 count after she's been on antiretroviral therapy for one month.  I spent greater than 45 minutes with the patient including greater than 50% of time  in face to face counsel of the patient and in coordination of their care.  FUO: could be due to antibiotic failure with her lung abscess, ie could mean she might need a surgical intervention. Could be due to PICC line infection, could be due to IRIS --check blood cultures x2 sites.  --Check chest x-ray --Continue her broad-spectrum antibiotics for lung abscess as well as her treatment for possible pulmonary tuberculosis with the caveat that the vancomycin still needs to be held while her renal function stabilizes and her vancomycin levels come down.   Lung abscess due to either tuberculosis or bacterial pathogens: --Was hoping that her lung abscess would be improving by now we did change her over to only oral antibiotics but I am disturbed by recent fevers. We will check a chest x-ray as above and I will make sure she sees me in 2 days' time.  Acute renal insufficiency: Likely multifactorial with fevers and dehydration possibly playing a role along with her blood pressure medicine her Bactrim use of nonsteroidal anti-inflammatory drugs and supratherapeutic levels of vancomycin competent in matters as described above vancomycin being held as her blood pressure medicine as is her Bactrim. Her should take 20 fluids  Cough: I have given her prescription for Hycodan  For tuberculosis versus latent tuberculosis in a patient with lung abscess. Her AFB cultures have all been negative to date Bactrim PCR was negative from bronchial V. lavage fluid. We'll continue for drugs to get to 2 months  of therapy. If her cultures are negative at 6 weeks again would simply finish 2 months of 4 drug therapy which should be sufficient for treatment for latent tuberculosis. If her cultures are positive we'll then continue on with full course treatment for pulmonary tuberculosis.

## 2013-05-12 NOTE — Telephone Encounter (Signed)
i think she should be seen

## 2013-05-12 NOTE — Telephone Encounter (Signed)
Patient called wanting a refill on pain medication and also wanting a refill on tussionex. Please advise

## 2013-05-12 NOTE — Patient Instructions (Addendum)
WE will get a Chest xray today  We will get blood cultures x 2 today  I have written a new rx for cough syrup for you  YOUR NEW HIV REGIMEN WILL BE :  TIVICAY 50MG  YELLOW TABLET ONCE DAILY  WITH  EPZICOM ORANGE TABLET DAILY  THESE SHOULD BE READY AT [facility] [place]  YOU WILL ALSO NEED TO KEEP TAKING AZITHROMYCIN TWO 600MG  TABLETS WEEKLY (ALSO AT [place] [facility])  PLEASE CONTINUE TO NOT TAKE THE BACTRIM FOR NOW   PLEASE DO NOT TAKE THE LOTENSIN  DO NOT TAKE IBUPROFEN  KEEP YOU APPT WITH ME THIS Thursday AND BRING YOUR MEDICINES WITH YOU

## 2013-05-12 NOTE — Telephone Encounter (Signed)
Patient called stating that RN Selena Batten from [facility] came out today and her fever was "103" today. I put in a call to Kim at [phone] and left her a message to call me.

## 2013-05-12 NOTE — Telephone Encounter (Signed)
I spoke with Monique [name] from [facility] and the patient had a fever today of 103 when she went out to collect blood work. Per Dr, Daiva Eves I could overbook his schedule at 1:45 today. I called the patient and left a message for her to call the office.

## 2013-05-13 ENCOUNTER — Telehealth: Payer: Self-pay | Admitting: *Deleted

## 2013-05-13 NOTE — Progress Notes (Signed)
Discussed case with Dr. Temple Pacini, He would be careful not to rush to surgery and advised continued medical management. Pt had been seen by Dr. Dorris Fetch in house and we should set her up with him for Followup  I will discuss further with pt tomorrow. We may also want to get a CT chest soon

## 2013-05-13 NOTE — Telephone Encounter (Signed)
Labs results from today as reported by Amy from [facility].  Random vancomycin trough = 14, creatinine = 2.5. Dr. Daiva Eves aware.  Pt to follow up in clinic tomorrow. Andree Coss, RN

## 2013-05-14 ENCOUNTER — Encounter: Payer: Self-pay | Admitting: Infectious Disease

## 2013-05-14 ENCOUNTER — Ambulatory Visit (INDEPENDENT_AMBULATORY_CARE_PROVIDER_SITE_OTHER): Payer: Self-pay | Admitting: Infectious Disease

## 2013-05-14 VITALS — BP 137/84 | HR 120 | Temp 99.0°F | Wt 164.0 lb

## 2013-05-14 DIAGNOSIS — J852 Abscess of lung without pneumonia: Secondary | ICD-10-CM

## 2013-05-14 DIAGNOSIS — Z227 Latent tuberculosis: Secondary | ICD-10-CM

## 2013-05-14 DIAGNOSIS — R509 Fever, unspecified: Secondary | ICD-10-CM

## 2013-05-14 DIAGNOSIS — N179 Acute kidney failure, unspecified: Secondary | ICD-10-CM

## 2013-05-14 DIAGNOSIS — B20 Human immunodeficiency virus [HIV] disease: Secondary | ICD-10-CM

## 2013-05-14 DIAGNOSIS — A150 Tuberculosis of lung: Secondary | ICD-10-CM

## 2013-05-14 NOTE — Progress Notes (Signed)
Subjective:    Patient ID: Monique [name], female    DOB: Dec 14, 1970, 42 y.o.   MRN: [id number]  HPI  42 year old Afro-American lady with newly diagnosed HIV AIDS also with lung abscess due to either bacteria or tuberculosis currently being treated for both with IV antibiotics and for drug therapy via the [facility] department.  She underwent bronchoscopy with BAL 3-4 days into vanco/unsasyn and this failed to yield any bacterial organisms, AFB stain was negative and cultures negative to date. Respiratory panel was negative PCP smear was negative. Some Candida albicans did grow from bronchoalveolar lavage. She also multiple sputum sent for AFB stain and culture these were negative. Bronchial V. lavage was sent for Mycobacterium tuberculosis by PCR testing and this was negative by PCR testing. She then had a lung biopsy performed by interventional radiology and specimens were sent to pathology and to microbiology.  Report showed nonspecific inflammation but no granulomas and no evidence of malignancy.   Cultures were and negative AFB stains and fungal stains were negative and no growth to date. [facility] antibiotics with IV vancomycin being dosed for trough of 15-20 along with meropenem.   In the past week  Prior to her last visit she began having fevers up to 103 with cough and chest pain. She has not had irritation around her PICC line she has not abdominal pain nausea or vomiting. During this time her vancomycin levels have been supratherapeutic with one measured at 84. Vancomycin has been held and levels are coming down. Her serum creatinine also increased above 2 in the clinic on that to 2.3. In addition to stopping her vancomycin we asked her stop her Lotensin as well as her Bactrim. She did take ibuprofen today after having a fever up to 103. I've asked her to not use that either.  At her last clinic visit we gave her  samples for Isentress she's been taking twice daily as well as Epzicom which she's been taking once daily. I've written prescriptions for TIVICAY and Epzicom and finally today they have been approved via the AIDS drug assistance program at her visit 2 days ago.  Followup  chest x-ray today showed enlargement of her cavitary lung abscess. I disccussed case with Dr. Temple Pacini from CT surgery and he recommended continued medical management. The pt comes in with all of her meds today and I filled her pill bottle. She feels as if she has had a bit of pruritis with switch to Tivicay this am.   Her serum cr  Peaked at 2.6 but now down to 2.4. Fevers and breathing better since 2 days Ago.     Review of Systems  Constitutional: Negative for chills, diaphoresis, activity change, appetite change, fatigue and unexpected weight change.  HENT: Negative for rhinorrhea, sneezing, trouble swallowing and sinus pressure.   Eyes: Negative for photophobia and visual disturbance.  Respiratory: Positive for shortness of breath. Negative for chest tightness and stridor.   Cardiovascular: Negative for palpitations and leg swelling.  Gastrointestinal: Negative for constipation, blood in stool, abdominal distention and anal bleeding.  Genitourinary: Negative for dysuria, hematuria, flank pain and difficulty urinating.  Musculoskeletal: Negative for myalgias, back pain, joint swelling, arthralgias and gait problem.  Skin: Negative for color change, pallor and wound.  Neurological: Negative for dizziness, tremors, weakness and light-headedness.  Hematological: Negative for adenopathy. Does not bruise/bleed easily.  Psychiatric/Behavioral: Negative for behavioral problems, confusion, sleep disturbance, dysphoric  mood, decreased concentration and agitation.       Objective:   Physical Exam  Constitutional: She is oriented to person, place, and time. She appears well-developed and well-nourished. No distress.  HENT:   Head: Normocephalic and atraumatic.  Mouth/Throat: Oropharynx is clear and moist. No oropharyngeal exudate.  Eyes: Conjunctivae and EOM are normal. Pupils are equal, round, and reactive to light. No scleral icterus.  Neck: Normal range of motion. Neck supple. No JVD present.  Cardiovascular: Normal rate, regular rhythm and normal heart sounds.  Exam reveals no gallop and no friction rub.   No murmur heard. Pulmonary/Chest: Effort normal. No respiratory distress. She has decreased breath sounds in the left middle field and the left lower field. She has no wheezes.  Abdominal: She exhibits no distension and no mass. There is no tenderness. There is no rebound and no guarding.  Musculoskeletal: She exhibits no edema and no tenderness.  Lymphadenopathy:    She has no cervical adenopathy.  Neurological: She is alert and oriented to person, place, and time. She has normal reflexes. She exhibits normal muscle tone. Coordination normal.  Skin: Skin is warm and dry. She is not diaphoretic. No erythema. No pallor.  Psychiatric: She has a normal mood and affect. Her behavior is normal. Judgment and thought content normal.          Assessment & Plan:  HIV/AIDS: -switched to TIvicay and Epzicom --Continue azithromycin for M avium  to Bactrim avium prophylaxis --Hold Bactrim for now until her creatinine stabilizes and then go to 3 times weekly Bactrim. --Recheck viral load and CD4 count after she's been on antiretroviral therapy for one month.  I spent greater than 45 minutes with the patient including greater than 50% of time in face to face counsel of the patient and in coordination of their care.  FUO:   Fevers better. Followup blood cultures, see discussion re lung abscess   --Check chest x-ray --Continue her broad-spectrum antibiotics for lung abscess as well as her treatment for possible pulmonary tuberculosis with the caveat that the vancomycin still needs to be held while her renal  function stabilizes and her vancomycin levels come down.   Lung abscess due to either tuberculosis or bacterial pathogens: --plan on continuing IV merrem for 4 more weeks if possible --consider adding doxy vs bactrim for MRSA coverage   Acute renal insufficiency: Likely multifactorial with fevers and dehydration possibly playing a role along with her blood pressure medicine her Bactrim use of nonsteroidal anti-inflammatory drugs and supratherapeutic levels of vancomycin competent in matters as described above vancomycin being held as her blood pressure medicine as is her Bactrim. Incrase Po fluids and bolus with NS via [facility]  Cough: continue  Hycodan  For tuberculosis versus latent tuberculosis in a patient with lung abscess. Her AFB cultures have all been negative to date Bactrim PCR was negative from bronchial V. lavage fluid. We'll continue for drugs to get to 2 months of therapy. If her cultures are negative at 6 weeks again would simply finish 2 months of 4 drug therapy which should be sufficient for treatment for latent tuberculosis. If her cultures are positive we'll then continue on with full course treatment for pulmonary tuberculosis.

## 2013-05-15 LAB — FUNGUS CULTURE W SMEAR

## 2013-05-18 ENCOUNTER — Telehealth: Payer: Self-pay | Admitting: *Deleted

## 2013-05-18 LAB — CULTURE, BLOOD (SINGLE): Organism ID, Bacteria: NO GROWTH

## 2013-05-18 NOTE — Telephone Encounter (Signed)
Pt has picked up medication already  

## 2013-05-20 ENCOUNTER — Telehealth: Payer: Self-pay | Admitting: *Deleted

## 2013-05-20 NOTE — Telephone Encounter (Signed)
Per Dr. Clinton Gallant office note the pt needs an additional 4 weeks of IV antibiotics.  Continue HH nursing visits for 4 weeks.  HH RN verbalized back the order.

## 2013-05-24 ENCOUNTER — Telehealth: Payer: Self-pay | Admitting: Internal Medicine

## 2013-05-24 ENCOUNTER — Encounter (HOSPITAL_COMMUNITY): Payer: Self-pay | Admitting: *Deleted

## 2013-05-24 ENCOUNTER — Inpatient Hospital Stay (HOSPITAL_COMMUNITY)
Admission: EM | Admit: 2013-05-24 | Discharge: 2013-05-27 | DRG: 811 | Disposition: A | Discharge status: Home / Self Care | Payer: Self-pay | Attending: Internal Medicine | Admitting: Internal Medicine

## 2013-05-24 DIAGNOSIS — I10 Essential (primary) hypertension: Secondary | ICD-10-CM | POA: Diagnosis present

## 2013-05-24 DIAGNOSIS — D649 Anemia, unspecified: Secondary | ICD-10-CM

## 2013-05-24 DIAGNOSIS — J852 Abscess of lung without pneumonia: Secondary | ICD-10-CM | POA: Diagnosis present

## 2013-05-24 DIAGNOSIS — D62 Acute posthemorrhagic anemia: Principal | ICD-10-CM | POA: Diagnosis present

## 2013-05-24 DIAGNOSIS — J851 Abscess of lung with pneumonia: Secondary | ICD-10-CM

## 2013-05-24 DIAGNOSIS — N179 Acute kidney failure, unspecified: Secondary | ICD-10-CM | POA: Diagnosis present

## 2013-05-24 DIAGNOSIS — J189 Pneumonia, unspecified organism: Secondary | ICD-10-CM | POA: Diagnosis present

## 2013-05-24 DIAGNOSIS — J9601 Acute respiratory failure with hypoxia: Secondary | ICD-10-CM

## 2013-05-24 DIAGNOSIS — A150 Tuberculosis of lung: Secondary | ICD-10-CM | POA: Diagnosis present

## 2013-05-24 DIAGNOSIS — J449 Chronic obstructive pulmonary disease, unspecified: Secondary | ICD-10-CM | POA: Diagnosis present

## 2013-05-24 DIAGNOSIS — J4489 Other specified chronic obstructive pulmonary disease: Secondary | ICD-10-CM | POA: Diagnosis present

## 2013-05-24 DIAGNOSIS — B20 Human immunodeficiency virus [HIV] disease: Secondary | ICD-10-CM | POA: Diagnosis present

## 2013-05-24 DIAGNOSIS — E871 Hypo-osmolality and hyponatremia: Secondary | ICD-10-CM

## 2013-05-24 HISTORY — DX: Pneumonia, unspecified organism: J18.9

## 2013-05-24 LAB — COMPREHENSIVE METABOLIC PANEL
ALT: 25 U/L (ref 0–35)
Alkaline Phosphatase: 73 U/L (ref 39–117)
BUN: 10 mg/dL (ref 6–23)
Chloride: 105 mEq/L (ref 96–112)
GFR calc Af Amer: 58 mL/min — ABNORMAL LOW (ref >90)
Glucose, Bld: 98 mg/dL (ref 70–99)
Potassium: 3.8 mEq/L (ref 3.5–5.1)
Sodium: 137 mEq/L (ref 135–145)
Total Bilirubin: 0.3 mg/dL (ref 0.3–1.2)

## 2013-05-24 LAB — LACTATE DEHYDROGENASE: LDH: 214 U/L (ref 94–250)

## 2013-05-24 LAB — CBC WITH DIFFERENTIAL/PLATELET
Hemoglobin: 5.2 g/dL — CL (ref 12.0–15.0)
Lymphocytes Relative: 19 % (ref 12–46)
Lymphs Abs: 1.8 10*3/uL (ref 0.7–4.0)
Monocytes Relative: 12 % (ref 3–12)
Neutro Abs: 5.4 10*3/uL (ref 1.7–7.7)
Neutrophils Relative %: 57 % (ref 43–77)
Platelets: 405 10*3/uL — ABNORMAL HIGH (ref 150–400)
RBC: 1.81 MIL/uL — ABNORMAL LOW (ref 3.87–5.11)
WBC: 9.6 10*3/uL (ref 4.0–10.5)

## 2013-05-24 LAB — RETICULOCYTES: RBC.: 1.82 MIL/uL — ABNORMAL LOW (ref 3.87–5.11)

## 2013-05-24 LAB — AFB CULTURE WITH SMEAR (NOT AT ARMC): Acid Fast Smear: NONE SEEN

## 2013-05-24 LAB — OCCULT BLOOD, POC DEVICE: Fecal Occult Bld: NEGATIVE

## 2013-05-24 LAB — PREPARE RBC (CROSSMATCH)

## 2013-05-24 MED ORDER — AZITHROMYCIN 600 MG PO TABS: 1200.0000 mg | ORAL_TABLET | ORAL | Status: DC

## 2013-05-24 MED ORDER — ISONIAZID 300 MG PO TABS
300.0000 mg | ORAL_TABLET | Freq: Every day | ORAL | Status: DC
  Administered 2013-05-25 – 2013-05-27 (×3): 300 mg via ORAL
  Filled 2013-05-24 (×3): qty 1

## 2013-05-24 MED ORDER — SODIUM CHLORIDE 0.9 % IV SOLN: 250.0000 mL | INTRAVENOUS | Status: DC | PRN

## 2013-05-24 MED ORDER — DEXTROMETHORPHAN POLISTIREX 30 MG/5ML PO LQCR
30.0000 mg | Freq: Two times a day (BID) | ORAL | Status: DC
Start: 2013-05-24 — End: 2013-05-27
  Administered 2013-05-25 – 2013-05-27 (×6): 30 mg via ORAL
  Filled 2013-05-24 (×7): qty 5

## 2013-05-24 MED ORDER — RIFABUTIN 150 MG PO CAPS
300.0000 mg | ORAL_CAPSULE | Freq: Every day | ORAL | Status: DC
  Administered 2013-05-25 – 2013-05-27 (×3): 300 mg via ORAL
  Filled 2013-05-24 (×3): qty 2

## 2013-05-24 MED ORDER — SODIUM CHLORIDE 0.9 % IJ SOLN: 3.0000 mL | INTRAMUSCULAR | Status: DC | PRN

## 2013-05-24 MED ORDER — SODIUM CHLORIDE 0.9 % IV SOLN
1.0000 g | Freq: Two times a day (BID) | INTRAVENOUS | Status: DC
  Administered 2013-05-25 – 2013-05-27 (×6): 1 g via INTRAVENOUS
  Filled 2013-05-24 (×7): qty 1

## 2013-05-24 MED ORDER — SODIUM CHLORIDE 0.9 % IJ SOLN: 3.0000 mL | Freq: Two times a day (BID) | INTRAMUSCULAR | Status: DC

## 2013-05-24 MED ORDER — AZITHROMYCIN 600 MG PO TABS
1200.0000 mg | ORAL_TABLET | ORAL | Status: DC
  Administered 2013-05-25: 1200 mg via ORAL
  Filled 2013-05-24: qty 2

## 2013-05-24 MED ORDER — VITAMIN B-6 50 MG PO TABS
50.0000 mg | ORAL_TABLET | Freq: Every day | ORAL | Status: DC
  Administered 2013-05-25 – 2013-05-27 (×3): 50 mg via ORAL
  Filled 2013-05-24 (×3): qty 1

## 2013-05-24 MED ORDER — DOLUTEGRAVIR SODIUM 50 MG PO TABS
50.0000 mg | ORAL_TABLET | Freq: Every day | ORAL | Status: DC
  Administered 2013-05-25 – 2013-05-27 (×3): 50 mg via ORAL
  Filled 2013-05-24 (×3): qty 1

## 2013-05-24 MED ORDER — HYDROCODONE-HOMATROPINE 5-1.5 MG/5ML PO SYRP
5.0000 mL | ORAL_SOLUTION | Freq: Four times a day (QID) | ORAL | Status: DC | PRN
  Administered 2013-05-25 – 2013-05-27 (×2): 5 mL via ORAL
  Filled 2013-05-24 (×2): qty 5

## 2013-05-24 MED ORDER — ABACAVIR SULFATE-LAMIVUDINE 600-300 MG PO TABS
1.0000 | ORAL_TABLET | Freq: Every day | ORAL | Status: DC
  Filled 2013-05-24: qty 1

## 2013-05-24 MED ORDER — ETHAMBUTOL HCL 400 MG PO TABS
1600.0000 mg | ORAL_TABLET | Freq: Every day | ORAL | Status: DC
  Administered 2013-05-25 – 2013-05-27 (×3): 1600 mg via ORAL
  Filled 2013-05-24 (×3): qty 4

## 2013-05-24 NOTE — ED Notes (Signed)
Pt reports having blood work done recently and md called her today and told her to come here due to hgb of 6.3. No acute distress noted at triage.

## 2013-05-24 NOTE — Telephone Encounter (Signed)
I received a call from [facility] that patients hemoglobin done today is 6.3, previously around 10. Unclear reasons.  I have asked her to be evaluated in the emergency room for confirmation and  ? Bleed/blood loss, need for transfusion or if hemolyzing.  Otherwise, we will follow up with her tomorrow.

## 2013-05-24 NOTE — ED Provider Notes (Signed)
CSN: [id number]     Arrival date & time 05/24/13  1710 History   First MD Initiated Contact with Patient 05/24/13 1725     Chief Complaint  Patient presents with  . Abnormal Lab   (Consider location/radiation/quality/duration/timing/severity/associated sxs/prior Treatment) HPI Patient presents with minimal physical complaints, but with mild fatigue and some concern over abnormal blood test results. Patient is notable history of HIV, pneumonia, TB.  She just started therapy for these entities within the past few weeks. As a part of this process, she has home health visits regularly.  Home health will draw today demonstrated abnormal hemoglobin. Patient denies syncope, near syncope, melena, black stool. She also denies unilateral weakness, chest pain.  She does endorse mild continued to cough, mild fatigue.  Past Medical History  Diagnosis Date  . Tonsillitis   . Hypertension   . TB (tuberculosis)   . Pneumonia    Past Surgical History  Procedure Laterality Date  . Cesarean section    . Video bronchoscopy Bilateral 04/13/2013    Procedure: VIDEO BRONCHOSCOPY WITHOUT FLUORO;  Surgeon: Kalman Shan, MD;  Location: [facility] ENDOSCOPY;  Service: Cardiopulmonary;  Laterality: Bilateral;   History reviewed. No pertinent family history. History  Substance Use Topics  . Smoking status: Never Smoker   . Smokeless tobacco: Not on file  . Alcohol Use: No   OB History   Grav Para Term Preterm Abortions TAB SAB Ect Mult Living                 Review of Systems  Constitutional:       Per HPI, otherwise negative  HENT:       Per HPI, otherwise negative  Respiratory:       Per HPI, otherwise negative  Cardiovascular:       Per HPI, otherwise negative  Gastrointestinal: Negative for vomiting.  Endocrine:       Negative aside from HPI  Genitourinary:       Neg aside from HPI   Musculoskeletal:       Per HPI, otherwise negative  Skin: Negative.   Neurological: Negative for syncope.     Allergies  Review of patient's allergies indicates no known allergies.  Home Medications   Current Outpatient Rx  Name  Route  Sig  Dispense  Refill  . abacavir-lamiVUDine (EPZICOM) 600-300 MG per tablet   Oral   Take 1 tablet by mouth daily.   30 tablet   11   . azithromycin (ZITHROMAX) 600 MG tablet   Oral   Take 1,200 mg by mouth every 7 (seven) days. Tuesday         . dextromethorphan (DELSYM) 30 MG/5ML liquid   Oral   Take 5 mLs (30 mg total) by mouth 2 (two) times daily.   89 mL   0   . dolutegravir (TIVICAY) 50 MG tablet   Oral   Take 1 tablet (50 mg total) by mouth daily.   30 tablet   11   . ethambutol (MYAMBUTOL) 400 MG tablet   Oral   Take 1,600 mg by mouth daily.         Marland Kitchen HYDROcodone-homatropine (HYCODAN) 5-1.5 MG/5ML syrup   Oral   Take 5 mLs by mouth every 6 (six) hours as needed for cough.   240 mL   1   . isoniazid (NYDRAZID) 300 MG tablet   Oral   Take 1 tablet (300 mg total) by mouth daily.   30 tablet   3   .  pyridOXINE (B-6) 50 MG tablet   Oral   Take 1 tablet (50 mg total) by mouth daily.   30 tablet   3   . rifabutin (MYCOBUTIN) 150 MG capsule   Oral   Take 2 capsules (300 mg total) by mouth daily.   60 capsule   3   . sodium chloride 0.9 % SOLN 100 mL with meropenem 1 G SOLR 1 g   Intravenous   Inject 1 g into the vein every 12 (twelve) hours.          BP 151/103  Pulse 109  Temp(Src) 98.4 F (36.9 C) (Oral)  Resp 20  SpO2 100%  LMP 04/20/2013 Physical Exam  Nursing note and vitals reviewed. Constitutional: She is oriented to person, place, and time. She appears well-developed and well-nourished. No distress.  HENT:  Head: Normocephalic and atraumatic.  Eyes: Conjunctivae and EOM are normal.  Cardiovascular: Normal rate and regular rhythm.   Pulmonary/Chest: Effort normal and breath sounds normal. No stridor. No respiratory distress.  Abdominal: She exhibits no distension. There is no tenderness.   Musculoskeletal: She exhibits no edema.  Neurological: She is alert and oriented to person, place, and time. No cranial nerve deficit.  Skin: Skin is warm and dry.  Psychiatric: She has a normal mood and affect.    ED Course  Procedures (including critical care time) Labs Review Labs Reviewed  CBC WITH DIFFERENTIAL - Abnormal; Notable for the following:    RBC 1.81 (*)    Hemoglobin 5.2 (*)    HCT 15.2 (*)    RDW 17.6 (*)    Platelets 405 (*)    Monocytes Absolute 1.2 (*)    Eosinophils Relative 12 (*)    Eosinophils Absolute 1.1 (*)    All other components within normal limits  COMPREHENSIVE METABOLIC PANEL - Abnormal; Notable for the following:    Creatinine, Ser 1.29 (*)    Albumin 2.4 (*)    GFR calc non Af Amer 50 (*)    GFR calc Af Amer 58 (*)    All other components within normal limits  OCCULT BLOOD, POC DEVICE  TYPE AND SCREEN  ABO/RH  PREPARE RBC (CROSSMATCH)    After the initial examination a return for Hemoccult exam.  This result was negative, both grossly and occult.  Update: Labs notable for hemoglobin of 5.2. Imaging Review No results found. After the initial review I reviewed the patient's chart.  Patient is starting therapy for HIV, has no recent CD4, viral load values.  MDM  No diagnosis found. This a patient with HIV, pneumonia recently TB recently, now presents with minimal physical complaints, but has no anemia.  With her initiation of multiple medications, as well as multiple ongoing medical processes, there is some suspicion for either medication effects or hemolysis.  Patient is Hemoccult negative, and her denial of any melena or hematemesis suggests no ongoing GI losses. Patient's blood values do not suggest either iron deficiency or other vitamin deficiency anemia. With the significant drop in hemoglobin, her significant medical problems, including TB, pneumonia, HIV she required admission, transfusion.  CRITICAL CARE Performed by: Gerhard Munch Total critical care time: 35 Critical care time was exclusive of separately billable procedures and treating other patients. Critical care was necessary to treat or prevent imminent or life-threatening deterioration. Critical care was time spent personally by me on the following activities: development of treatment plan with patient and/or surrogate as well as nursing, discussions with consultants, evaluation of patient's  response to treatment, examination of patient, obtaining history from patient or surrogate, ordering and performing treatments and interventions, ordering and review of laboratory studies, ordering and review of radiographic studies, pulse oximetry and re-evaluation of patient's condition.     Gerhard Munch, MD 05/25/13 0010

## 2013-05-24 NOTE — H&P (Signed)
Chief Complaint:  Abn labs  HPI: 42 yo female recently hospitalized 04/09/13 with new dx of lung absess/resp failure/sepsis new HIV/AIDS diagnosis being treated for both TB (for a month, daily meds given by health dept) and bacterial pna by Dr. Daiva Eves.  She was started on HIV treatment about one week ago.  Her baseline hgb was around 9 initially, labs were drawn today and her hgb has dropped to below 6.  mcv nml.  Pt was sent here for anemia.  She has been dizzy and fatigued.  Denies any bleeding issues.  Her heme occult was negative in the ED.  No syncopal events.  No fevers.  She overall has been improving since her hospitalization last month, and has been very compliant in taking her medications and making her appts with ID.  Denies any cp, sob, n/v, diarrhea or abd pain.    Review of Systems:  Positive and negative as per HPI otherwise all other systems are negative  Past Medical History: Past Medical History  Diagnosis Date  . Tonsillitis   . Hypertension   . TB (tuberculosis)   . Pneumonia    Past Surgical History  Procedure Laterality Date  . Cesarean section    . Video bronchoscopy Bilateral 04/13/2013    Procedure: VIDEO BRONCHOSCOPY WITHOUT FLUORO;  Surgeon: Kalman Shan, MD;  Location: [facility] ENDOSCOPY;  Service: Cardiopulmonary;  Laterality: Bilateral;    Medications: Prior to Admission medications   Medication Sig Start Date End Date Taking? Authorizing Provider  abacavir-lamiVUDine (EPZICOM) 600-300 MG per tablet Take 1 tablet by mouth daily. 05/12/13  Yes Randall Hiss, MD  azithromycin (ZITHROMAX) 600 MG tablet Take 1,200 mg by mouth every 7 (seven) days. Tuesday 05/12/13  Yes Randall Hiss, MD  dextromethorphan (DELSYM) 30 MG/5ML liquid Take 5 mLs (30 mg total) by mouth 2 (two) times daily. 04/22/13  Yes Nishant Dhungel, MD  dolutegravir (TIVICAY) 50 MG tablet Take 1 tablet (50 mg total) by mouth daily. 05/12/13  Yes Randall Hiss, MD  ethambutol  (MYAMBUTOL) 400 MG tablet Take 1,600 mg by mouth daily. 04/22/13  Yes Nishant Dhungel, MD  HYDROcodone-homatropine (HYCODAN) 5-1.5 MG/5ML syrup Take 5 mLs by mouth every 6 (six) hours as needed for cough. 05/12/13  Yes Randall Hiss, MD  isoniazid (NYDRAZID) 300 MG tablet Take 1 tablet (300 mg total) by mouth daily. 04/22/13  Yes Nishant Dhungel, MD  pyridOXINE (B-6) 50 MG tablet Take 1 tablet (50 mg total) by mouth daily. 04/22/13  Yes Nishant Dhungel, MD  rifabutin (MYCOBUTIN) 150 MG capsule Take 2 capsules (300 mg total) by mouth daily. 04/22/13  Yes Nishant Dhungel, MD  sodium chloride 0.9 % SOLN 100 mL with meropenem 1 G SOLR 1 g Inject 1 g into the vein every 12 (twelve) hours. 04/22/13  Yes Nishant Dhungel, MD    Allergies:  No Known Allergies  Social History:  reports that she has never smoked. She does not have any smokeless tobacco history on file. She reports that she does not drink alcohol or use illicit drugs.  Family History: History reviewed. No pertinent family history.  Physical Exam: Filed Vitals:   05/24/13 1806 05/24/13 1815 05/24/13 1830 05/24/13 1934  BP: 152/103 141/91  151/103  Pulse: 116  109 109  Temp:    98.4 F (36.9 C)  TempSrc:      Resp: 18   20  SpO2: 100% 100%  100%   General appearance: alert, cooperative and  no distress Head: Normocephalic, without obvious abnormality, atraumatic Eyes: negative Nose: Nares normal. Septum midline. Mucosa normal. No drainage or sinus tenderness. Neck: no JVD and supple, symmetrical, trachea midline Lungs: clear to auscultation bilaterally Heart: regular rate and rhythm, S1, S2 normal, no murmur, click, rub or gallop Abdomen: soft, non-tender; bowel sounds normal; no masses,  no organomegaly Extremities: extremities normal, atraumatic, no cyanosis or edema Pulses: 2+ and symmetric Skin: Skin color, texture, turgor normal. No rashes or lesions Neurologic: Grossly normal    Labs on Admission:   Recent Labs   05/24/13 1810  NA 137  K 3.8  CL 105  CO2 22  GLUCOSE 98  BUN 10  CREATININE 1.29*  CALCIUM 9.1    Recent Labs  05/24/13 1810  AST 32  ALT 25  ALKPHOS 73  BILITOT 0.3  PROT 8.0  ALBUMIN 2.4*    Recent Labs  05/24/13 1810  WBC 9.6  NEUTROABS 5.4  HGB 5.2*  HCT 15.2*  MCV 84.0  PLT 405*    Radiological Exams on Admission:  Assessment/Plan  42 yo female with worsening normocytic anemia recently dx with hiv/aids and treated for bacterial lung absess and TB Principal Problem:   Normocytic anemia Active Problems:   Pneumonia with lung abscess   Acute kidney injury   Human immunodeficiency virus (HIV) disease   Pulmonary tuberculosis  Transfuse 2 units prbc.  Order anemia panel.  Called Dr. Luciana Axe ID for consultation in am, will not hold any of her medications at this time as it looks like she is not hemolyzing.  No overt bleeding.  obs on med bed.  Full code.  Neg pressure room.  Arnell Mausolf A 05/24/2013, 8:06 PM

## 2013-05-25 ENCOUNTER — Encounter (HOSPITAL_COMMUNITY): Payer: Self-pay | Admitting: *Deleted

## 2013-05-25 LAB — AFB CULTURE WITH SMEAR (NOT AT ARMC)

## 2013-05-25 LAB — CBC
Platelets: 357 10*3/uL (ref 150–400)
RBC: 3.12 MIL/uL — ABNORMAL LOW (ref 3.87–5.11)
RDW: 17 % — ABNORMAL HIGH (ref 11.5–15.5)
WBC: 10.3 10*3/uL (ref 4.0–10.5)

## 2013-05-25 LAB — IRON AND TIBC
Iron: 167 ug/dL — ABNORMAL HIGH (ref 42–135)
TIBC: 236 ug/dL — ABNORMAL LOW (ref 250–470)

## 2013-05-25 LAB — VITAMIN B12: Vitamin B-12: 549 pg/mL (ref 211–911)

## 2013-05-25 MED ORDER — ABACAVIR SULFATE 300 MG PO TABS
600.0000 mg | ORAL_TABLET | Freq: Every day | ORAL | Status: DC
  Administered 2013-05-25 – 2013-05-27 (×3): 600 mg via ORAL
  Filled 2013-05-25 (×3): qty 2

## 2013-05-25 MED ORDER — HYDRALAZINE HCL 20 MG/ML IJ SOLN: 10.0000 mg | Freq: Four times a day (QID) | INTRAMUSCULAR | Status: DC | PRN

## 2013-05-25 MED ORDER — SODIUM CHLORIDE 0.9 % IV SOLN
250.0000 mL | INTRAVENOUS | Status: DC | PRN
  Administered 2013-05-26: 1000 mL via INTRAVENOUS

## 2013-05-25 MED ORDER — TRAMADOL HCL 50 MG PO TABS
50.0000 mg | ORAL_TABLET | Freq: Four times a day (QID) | ORAL | Status: DC | PRN
  Administered 2013-05-26: 50 mg via ORAL
  Filled 2013-05-25: qty 1

## 2013-05-25 MED ORDER — AMLODIPINE BESYLATE 10 MG PO TABS
10.0000 mg | ORAL_TABLET | Freq: Every day | ORAL | Status: DC
  Administered 2013-05-25 – 2013-05-27 (×3): 10 mg via ORAL
  Filled 2013-05-25 (×3): qty 1

## 2013-05-25 MED ORDER — LAMIVUDINE 150 MG PO TABS
300.0000 mg | ORAL_TABLET | Freq: Every day | ORAL | Status: DC
  Administered 2013-05-25 – 2013-05-27 (×3): 300 mg via ORAL
  Filled 2013-05-25 (×3): qty 2

## 2013-05-25 MED ORDER — ACETAMINOPHEN 325 MG PO TABS
650.0000 mg | ORAL_TABLET | Freq: Four times a day (QID) | ORAL | Status: DC | PRN
  Administered 2013-05-25 – 2013-05-27 (×4): 650 mg via ORAL
  Filled 2013-05-25 (×4): qty 2

## 2013-05-25 NOTE — Telephone Encounter (Signed)
Patient is currently admitted; I spoke with her and she did receive a transfusion. Monique [name]

## 2013-05-25 NOTE — Consult Note (Signed)
INFECTIOUS DISEASE CONSULT NOTE  Date of Admission:  05/24/2013  Date of Consult:  05/25/2013  Reason for Consult: Anemia, Lung Abscess Referring Physician: Onalee Hua  Impression/Recommendation Anemia Lung Abscess AIDS (CD4 70 04-10-13)  Would  Continue her current meds (her vanco is on hold due to increased Cr) Consider her meds as cause vs IRIS?  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 Will watch how she responds to transfusion, have alerted health dept to her hospitalization [phone])  Thank you so much for this interesting consult,   Monique [name] (pager) [phone] [email or web address]  Monique [name] is an 42 y.o. female.  HPI: 42 yo F with hx of COPD and hospitalization (04-09-13 to 04-22-13) when she was found to have a lung abscess and HIV. She underwent BAL (04-13-13) which was non-diagnostic. She was d/c home on vanco/merrem as well as DOT for TB.   She was seen in ID f/u 9-16 after having a fever to 103, elevated Cr and Vanco level 84. She had BCx sent, and a CXR showing some enlargement of her cavity. Her case was discussed with CVTS who did not feel surgery was indicated.  She returns to hospital 9-28 with H/H 15.2/17.6 and Cr 1.29 (previously 2.5 at home on 9-17).      Past Medical History  Diagnosis Date  . Tonsillitis   . Hypertension   . TB (tuberculosis)   . Pneumonia     Past Surgical History  Procedure Laterality Date  . Cesarean section    . Video bronchoscopy Bilateral 04/13/2013    Procedure: VIDEO BRONCHOSCOPY WITHOUT FLUORO;  Surgeon: Kalman Shan, MD;  Location: [facility] ENDOSCOPY;  Service: Cardiopulmonary;  Laterality: Bilateral;     No Known Allergies  Medications:  Scheduled: . abacavir  600 mg Oral Daily  . amLODipine  10 mg Oral Daily  . [START ON 05/26/2013] azithromycin  1,200 mg Oral Q7 days  .  dextromethorphan  30 mg Oral BID  . dolutegravir  50 mg Oral Daily  . ethambutol  1,600 mg Oral Daily  . isoniazid  300 mg Oral Daily  . lamiVUDine  300 mg Oral Daily  . meropenem (MERREM) 1 GM IVPB  1 g Intravenous Q12H  . pyridOXINE  50 mg Oral Daily  . rifabutin  300 mg Oral Daily  . sodium chloride  3 mL Intravenous Q12H    Total days of antibiotics:  E/R/P/I/B6 ABC/3TC/DTGV Merrem/Vanco-          Social History:  reports that she has never smoked. She does not have any smokeless tobacco history on file. She reports that she does not drink alcohol or use illicit drugs.  Family History  Problem Relation Age of Onset  . Diabetes Sister   . Hypertension Sister     General ROS: 1 week of SOB, DOE, heart fluttering. No vison change, no dyphagia, no orla ulcers, no BRBPR, no problems with PIC line, see HPI.   Blood pressure 184/107, pulse 126, temperature 98.4 F (36.9 C), temperature source Oral, resp. rate 19, height 5\' 2"  (1.575 m), weight 72.576 kg (160 lb), last menstrual period 04/20/2013, SpO2 98.00%. General appearance: alert, cooperative and no distress Eyes: negative findings: pupils equal, round, reactive to light and accomodation Throat: normal findings: oropharynx pink & moist without lesions or evidence of thrush Neck: no adenopathy and supple, symmetrical, trachea midline Lungs: clear to auscultation bilaterally  Heart: regular rate and rhythm Abdomen: normal findings: bowel sounds normal and soft, non-tender Extremities: edema none, no rashes.  RUE PIC is clean, non-tender, no d/c.     Results for orders placed during the hospital encounter of 05/24/13 (from the past 48 hour(s))  CBC WITH DIFFERENTIAL     Status: Abnormal   Collection Time    05/24/13  6:10 PM      Result Value Range   WBC 9.6  4.0 - 10.5 K/uL   RBC 1.81 (*) 3.87 - 5.11 MIL/uL   Hemoglobin 5.2 (*) 12.0 - 15.0 g/dL   Comment: REPEATED TO VERIFY     CRITICAL RESULT CALLED TO, READ BACK BY  AND VERIFIED WITH:     BERVARD BRITTNEY RN AT 1856 ON 05/24/13 BY INGOLD L   HCT 15.2 (*) 36.0 - 46.0 %   MCV 84.0  78.0 - 100.0 fL   MCH 28.7  26.0 - 34.0 pg   MCHC 34.2  30.0 - 36.0 g/dL   RDW 14.7 (*) 82.9 - 56.2 %   Platelets 405 (*) 150 - 400 K/uL   Neutrophils Relative % 57  43 - 77 %   Neutro Abs 5.4  1.7 - 7.7 K/uL   Lymphocytes Relative 19  12 - 46 %   Lymphs Abs 1.8  0.7 - 4.0 K/uL   Monocytes Relative 12  3 - 12 %   Monocytes Absolute 1.2 (*) 0.1 - 1.0 K/uL   Eosinophils Relative 12 (*) 0 - 5 %   Eosinophils Absolute 1.1 (*) 0.0 - 0.7 K/uL   Basophils Relative 1  0 - 1 %   Basophils Absolute 0.1  0.0 - 0.1 K/uL  COMPREHENSIVE METABOLIC PANEL     Status: Abnormal   Collection Time    05/24/13  6:10 PM      Result Value Range   Sodium 137  135 - 145 mEq/L   Potassium 3.8  3.5 - 5.1 mEq/L   Chloride 105  96 - 112 mEq/L   CO2 22  19 - 32 mEq/L   Glucose, Bld 98  70 - 99 mg/dL   BUN 10  6 - 23 mg/dL   Creatinine, Ser 1.30 (*) 0.50 - 1.10 mg/dL   Calcium 9.1  8.4 - 86.5 mg/dL   Total Protein 8.0  6.0 - 8.3 g/dL   Albumin 2.4 (*) 3.5 - 5.2 g/dL   AST 32  0 - 37 U/L   ALT 25  0 - 35 U/L   Alkaline Phosphatase 73  39 - 117 U/L   Total Bilirubin 0.3  0.3 - 1.2 mg/dL   GFR calc non Af Amer 50 (*) >90 mL/min   GFR calc Af Amer 58 (*) >90 mL/min   Comment: (NOTE)     The eGFR has been calculated using the CKD EPI equation.     This calculation has not been validated in all clinical situations.     eGFR's persistently <90 mL/min signify possible Chronic Kidney     Disease.  TYPE AND SCREEN     Status: None   Collection Time    05/24/13  6:10 PM      Result Value Range   ABO/RH(D) A POS     Antibody Screen NEG     Sample Expiration 05/27/2013     Unit Number [id number]     Blood Component Type RED CELLS,LR     Unit division 00     Status of Unit  ISSUED,FINAL     Transfusion Status OK TO TRANSFUSE     Crossmatch Result Compatible     Unit Number [id number]       Blood Component Type RED CELLS,LR     Unit division 00     Status of Unit ISSUED     Transfusion Status OK TO TRANSFUSE     Crossmatch Result Compatible     Unit Number [id number]     Blood Component Type RED CELLS,LR     Unit division 00     Status of Unit ALLOCATED     Transfusion Status OK TO TRANSFUSE     Crossmatch Result Compatible     Unit Number [id number]     Blood Component Type RED CELLS,LR     Unit division 00     Status of Unit REL FROM [facility]     Transfusion Status OK TO TRANSFUSE     Crossmatch Result Compatible    ABO/RH     Status: None   Collection Time    05/24/13  6:10 PM      Result Value Range   ABO/RH(D) A POS    OCCULT BLOOD, POC DEVICE     Status: None   Collection Time    05/24/13  7:06 PM      Result Value Range   Fecal Occult Bld NEGATIVE  NEGATIVE  PREPARE RBC (CROSSMATCH)     Status: None   Collection Time    05/24/13  7:24 PM      Result Value Range   Order Confirmation ORDER PROCESSED BY BLOOD BANK    LACTATE DEHYDROGENASE     Status: None   Collection Time    05/24/13  7:48 PM      Result Value Range   LDH 214  94 - 250 U/L   Comment: HEMOLYSIS AT THIS LEVEL MAY AFFECT RESULT  HAPTOGLOBIN     Status: None   Collection Time    05/24/13  7:48 PM      Result Value Range   Haptoglobin 137  45 - 215 mg/dL   Comment: Performed at [facility]  RETICULOCYTES     Status: Abnormal   Collection Time    05/24/13  7:48 PM      Result Value Range   Retic Ct Pct 3.9 (*) 0.4 - 3.1 %   RBC. 1.82 (*) 3.87 - 5.11 MIL/uL   Retic Count, Manual 71.0  19.0 - 186.0 K/uL  PREPARE RBC (CROSSMATCH)     Status: None   Collection Time    05/24/13 11:59 PM      Result Value Range   Order Confirmation ORDER PROCESSED BY BLOOD BANK    VITAMIN B12     Status: None   Collection Time    05/25/13  5:11 AM      Result Value Range   Vitamin B-12 549  211 - 911 pg/mL   Comment: Performed at [facility]  IRON AND TIBC     Status:  Abnormal   Collection Time    05/25/13  5:11 AM      Result Value Range   Iron 167 (*) 42 - 135 ug/dL   TIBC 528 (*) 413 - 244 ug/dL   Saturation Ratios 71 (*) 20 - 55 %   UIBC 69 (*) 125 - 400 ug/dL   Comment: Performed at [facility]  FERRITIN     Status: Abnormal   Collection Time  05/25/13  5:11 AM      Result Value Range   Ferritin 638 (*) 10 - 291 ng/mL   Comment: Performed at [facility]  FOLATE     Status: None   Collection Time    05/25/13  5:11 AM      Result Value Range   Folate 8.3     Comment: (NOTE)     Reference Ranges            Deficient:       0.4 - 3.3 ng/mL            Indeterminate:   3.4 - 5.4 ng/mL            Normal:              > 5.4 ng/mL     Performed at [facility]      Component Value Date/Time   SDES TISSUE LUNG LEFT 04/17/2013 1036   SPECREQUEST CT CORE LEFT LUNG ABSCESS 04/17/2013 1036   CULT  Value: CULTURE WILL BE EXAMINED FOR 6 WEEKS BEFORE ISSUING A FINAL REPORT Performed at [facility] 04/17/2013 1036   REPTSTATUS PENDING 04/17/2013 1036   No results found. No results found for this or any previous visit (from the past 240 hour(s)).    05/25/2013, 3:57 PM     LOS: 1 day

## 2013-05-25 NOTE — Progress Notes (Signed)
[facility] Progress Note [place] TEAM 1 - Stepdown/ICU TEAM   GLYNDA SOLIDAY ZOX:[id number] DOB: [birth year] DOA: 05/24/2013 PCP: Pcp Not In System  Admit HPI / Brief Narrative: 42 yo female hospitalized 04/09/13 with new dx of lung absess/resp failure/sepsis new HIV/AIDS diagnosis being treated for both TB (for a month, daily meds given by health dept) and bacterial pna by Dr. Daiva Eves. She was started on HIV treatment about one week ago. Her baseline hgb was around 9 initially.  Labs were drawn the day of her admit and her hgb had dropped to below 6. MCV nml. Pt was sent the [facility] for anemia. She had been dizzy and fatigued. Denied any bleeding issues. Her stool hemeoccult was negative in the ED. No syncopal events. No fevers. She overall had been improving since her hospitalization last month, and had been very compliant in taking her medications and making her appts with ID.   Assessment/Plan:  Acute normocytic anemia  No gross evidence of blood loss - repeat Hgb at admit 5.2 - reported baseline is ~9 - B12 and folate normal - no evidence of Fe deficiency on labs - LDH + haptoglobin normal - TIBC is low - Retic % is elevated - ?related to TB medications - ID to comment - follow Hgb trend - no further transfusion planned at this time   L Lung cavitary pulmonary TB Resume meds as per outpt tx plan - ID to see - negative pressure resp isolation  Bacterial PNA w/ abcess Cont empriric abx - ID to comment  Acute kidney injury Mild - suspect simply prerenal due to anemia - hydrate/transfuse and recheck in AM  HIV As per ID   Elevated BP Not noted on prior hospital stay - begin med tx and follow   Code Status: FULL Family Communication: no family present at time of exam Disposition Plan: neg pressure med bed - await ID recs   Consultants: ID  Procedures: none  Antibiotics: Meropenem 9/29 >>  DVT prophylaxis: SCDs  HPI/Subjective: Pt feels much better post  transfusion. She denies gross blood loss - saying she only has 2-3 days of minimal menstrual bleeding per month.  No cp, sob, f/c, n/v, or abdom pain.  Objective: Blood pressure 184/107, pulse 126, temperature 98.4 F (36.9 C), temperature source Oral, resp. rate 19, height 5\' 2"  (1.575 m), weight 72.576 kg (160 lb), last menstrual period 04/20/2013, SpO2 98.00%.  Intake/Output Summary (Last 24 hours) at 05/25/13 1612 Last data filed at 05/25/13 0124  Gross per 24 hour  Intake    701 ml  Output      0 ml  Net    701 ml    Exam: General: No acute respiratory distress Lungs: Clear to auscultation bilaterally without wheezes or crackles Cardiovascular: Regular rate and rhythm without murmur gallop or rub  Abdomen: Nontender, nondistended, soft, bowel sounds positive, no rebound, no ascites, no appreciable mass Extremities: No significant cyanosis, clubbing, or edema bilateral lower extremities  Data Reviewed: Basic Metabolic Panel:  Recent Labs Lab 05/24/13 1810  NA 137  K 3.8  CL 105  CO2 22  GLUCOSE 98  BUN 10  CREATININE 1.29*  CALCIUM 9.1   Liver Function Tests:  Recent Labs Lab 05/24/13 1810  AST 32  ALT 25  ALKPHOS 73  BILITOT 0.3  PROT 8.0  ALBUMIN 2.4*   CBC:  Recent Labs Lab 05/24/13 1810 05/25/13 1413  WBC 9.6 10.3  NEUTROABS 5.4  --   HGB  5.2* 9.1*  HCT 15.2* 25.5*  MCV 84.0 81.7  PLT 405* 357    Studies:  Recent x-ray studies have been reviewed in detail by the Attending Physician  Scheduled Meds:  Scheduled Meds: . abacavir  600 mg Oral Daily  . amLODipine  10 mg Oral Daily  . [START ON 05/26/2013] azithromycin  1,200 mg Oral Q7 days  . dextromethorphan  30 mg Oral BID  . dolutegravir  50 mg Oral Daily  . ethambutol  1,600 mg Oral Daily  . isoniazid  300 mg Oral Daily  . lamiVUDine  300 mg Oral Daily  . meropenem (MERREM) 1 GM IVPB  1 g Intravenous Q12H  . pyridOXINE  50 mg Oral Daily  . rifabutin  300 mg Oral Daily  . sodium  chloride  3 mL Intravenous Q12H    Time spent on care of this patient: 35 mins   [facility]  [facility] Office  [phone] Pager - Text Page per Loretha Stapler as per below:  On-Call/Text Page:      [email or web address]      password [email or web address]  If 7PM-7AM, please contact night-coverage [email or web address] Password [email or web address] 05/25/2013, 4:12 PM   LOS: 1 day

## 2013-05-26 ENCOUNTER — Encounter: Payer: Self-pay | Admitting: Infectious Disease

## 2013-05-26 DIAGNOSIS — J9600 Acute respiratory failure, unspecified whether with hypoxia or hypercapnia: Secondary | ICD-10-CM

## 2013-05-26 LAB — COMPREHENSIVE METABOLIC PANEL
ALT: 24 U/L (ref 0–35)
Albumin: 2.5 g/dL — ABNORMAL LOW (ref 3.5–5.2)
Alkaline Phosphatase: 67 U/L (ref 39–117)
BUN: 11 mg/dL (ref 6–23)
Chloride: 104 mEq/L (ref 96–112)
Creatinine, Ser: 1 mg/dL (ref 0.50–1.10)
Potassium: 3.6 mEq/L (ref 3.5–5.1)
Sodium: 137 mEq/L (ref 135–145)
Total Bilirubin: 0.3 mg/dL (ref 0.3–1.2)
Total Protein: 8.2 g/dL (ref 6.0–8.3)

## 2013-05-26 LAB — AFB CULTURE WITH SMEAR (NOT AT ARMC): Acid Fast Smear: NONE SEEN

## 2013-05-26 LAB — CBC
HCT: 24.1 % — ABNORMAL LOW (ref 36.0–46.0)
Hemoglobin: 8.5 g/dL — ABNORMAL LOW (ref 12.0–15.0)
MCHC: 35.3 g/dL (ref 30.0–36.0)
Platelets: 310 10*3/uL (ref 150–400)
RDW: 17.4 % — ABNORMAL HIGH (ref 11.5–15.5)
WBC: 7.9 10*3/uL (ref 4.0–10.5)

## 2013-05-26 MED ORDER — PYRAZINAMIDE 500 MG PO TABS
2000.0000 mg | ORAL_TABLET | Freq: Every day | ORAL | Status: DC
  Administered 2013-05-26 – 2013-05-27 (×2): 2000 mg via ORAL
  Filled 2013-05-26 (×2): qty 4

## 2013-05-26 MED ORDER — VANCOMYCIN HCL IN DEXTROSE 1-5 GM/200ML-% IV SOLN
1000.0000 mg | Freq: Two times a day (BID) | INTRAVENOUS | Status: DC
  Administered 2013-05-26 – 2013-05-27 (×2): 1000 mg via INTRAVENOUS
  Filled 2013-05-26 (×4): qty 200

## 2013-05-26 NOTE — Progress Notes (Signed)
ANTIBIOTIC CONSULT NOTE - INITIAL  Pharmacy Consult for vancomycin Indication: lung abscess  No Known Allergies  Patient Measurements: Height: 5\' 2"  (157.5 cm) Weight: 160 lb (72.576 kg) IBW/kg (Calculated) : 50.1  Vital Signs: Temp: 98.6 F (37 C) (09/30 8657) Temp src: Oral (09/30 0638) BP: 136/95 mmHg (09/30 1012) Pulse Rate: 103 (09/30 0638) Intake/Output from previous day:   Intake/Output from this shift:    Labs:  Recent Labs  05/24/13 1810 05/25/13 1413 05/26/13 0632  WBC 9.6 10.3 7.9  HGB 5.2* 9.1* 8.5*  PLT 405* 357 310  CREATININE 1.29*  --  1.00   Estimated Creatinine Clearance: 68.4 ml/min (by C-G formula based on Cr of 1). No results found for this basename: VANCOTROUGH, Leodis Binet, VANCORANDOM, GENTTROUGH, GENTPEAK, GENTRANDOM, TOBRATROUGH, TOBRAPEAK, TOBRARND, AMIKACINPEAK, AMIKACINTROU, AMIKACIN,  in the last 72 hours   Microbiology: Recent Results (from the past 720 hour(s))  CULTURE, BLOOD (SINGLE)     Status: None   Collection Time    05/12/13  3:05 PM      Result Value Range Status   Organism ID, Bacteria NO GROWTH 5 DAYS   Final  CULTURE, BLOOD (SINGLE)     Status: None   Collection Time    05/12/13  3:06 PM      Result Value Range Status   Organism ID, Bacteria NO GROWTH 5 DAYS   Final    Medical History: Past Medical History  Diagnosis Date  . Tonsillitis   . Hypertension   . TB (tuberculosis)   . Pneumonia     Medications:  Prescriptions prior to admission  Medication Sig Dispense Refill  . abacavir-lamiVUDine (EPZICOM) 600-300 MG per tablet Take 1 tablet by mouth daily.  30 tablet  11  . azithromycin (ZITHROMAX) 600 MG tablet Take 1,200 mg by mouth every 7 (seven) days. Tuesday      . dextromethorphan (DELSYM) 30 MG/5ML liquid Take 5 mLs (30 mg total) by mouth 2 (two) times daily.  89 mL  0  . dolutegravir (TIVICAY) 50 MG tablet Take 1 tablet (50 mg total) by mouth daily.  30 tablet  11  . ethambutol (MYAMBUTOL) 400 MG tablet  Take 1,600 mg by mouth daily.      Marland Kitchen HYDROcodone-homatropine (HYCODAN) 5-1.5 MG/5ML syrup Take 5 mLs by mouth every 6 (six) hours as needed for cough.  240 mL  1  . isoniazid (NYDRAZID) 300 MG tablet Take 1 tablet (300 mg total) by mouth daily.  30 tablet  3  . pyridOXINE (B-6) 50 MG tablet Take 1 tablet (50 mg total) by mouth daily.  30 tablet  3  . rifabutin (MYCOBUTIN) 150 MG capsule Take 2 capsules (300 mg total) by mouth daily.  60 capsule  3  . sodium chloride 0.9 % SOLN 100 mL with meropenem 1 G SOLR 1 g Inject 1 g into the vein every 12 (twelve) hours.       Assessment: 42 yo F with newly diagnosed HIV and TB to resume vancomycin for lung abscess.  She was on vanc/meropenem PTA for lung abscess.  Vanc level was 14 on 9/17.  Vanc has been held this admission for ARF and now it is to be resumed.   Creat 1.0, wt 72.6 kg. AF, no culture data.   Abx history: Azithromycin q week PTA >> Isoniazid PTA >> Ethambutol PTA >> Rifabutin PTA >>  Meropenem 9/29 (PTA?) >> vanc on hold d/t increased Creatinine  Goal of Therapy:  Vancomycin trough level 15-20  mcg/ml  Plan:  1. Vancomycin 1000 mg IV q12h 2. Check steady-state trough ASAP 2nd to recent ARF Herby Abraham, Pharm.D. [phone] 05/26/2013 3:51 PM

## 2013-05-26 NOTE — Progress Notes (Signed)
[facility]  Patient Status:    Active pt with [facility] prior to this  readmission  [facility] is providing the following services:   Pt receiving HH RN and Home Infusion Pharmacy services for home IV Abx therapy.  [facility] hospital coordinator team will follow pt and support transition back home when deemed appropriate.  If patient discharges after hours, please call [phone].   Sedalia Muta 05/26/2013, 2:31 PM

## 2013-05-26 NOTE — Progress Notes (Signed)
Patient ID: Monique [name], female   DOB: [birth year], 42 y.o.   MRN: [id number] [facility] PROGRESS NOTE  KOLETTE VEY WUJ:[id number] DOB: 1971-02-11 DOA: 05/24/2013 PCP: Pcp Not In System  Assessment/Plan:   Normocytic anemia: sp 2uprbc's with expected rise. Feeling much better. Anemia likely multifactorial due to chronic disease as well as medications and/or immune reconstitution.   Pneumonia with lung abscess/pulmonary TB: health department is following. Continue azithro, fibabutin   Acute kidney injury: resolved   Human immunodeficiency virus (HIV) disease: continue current medictions. Dispo: if hgb remains stable may be able to dc in am  Code Status: FULL Family Communication:  Spoke with patient at bedside Disposition Plan: as above   Consultants:  Infectious disease  Procedures:  tranfused 2u prbc 9/29  Antibiotics:  Per ID  HPI/Subjective: Feeling much better today.  No complaints.  Objective: Filed Vitals:   05/26/13 1012  BP: 136/95  Pulse:   Temp:   Resp:    No intake or output data in the 24 hours ending 05/26/13 1304 Filed Weights   05/25/13 0124  Weight: 72.576 kg (160 lb)    Exam:   General:  NAD  Cardiovascular: RRR no mrg  Respiratory:  Diffuse crackles, no wheezes, good air movement  Abdomen: bs normal, soft, non tender  Data Reviewed: Basic Metabolic Panel:  Recent Labs Lab 05/24/13 1810 05/26/13 0632  NA 137 137  K 3.8 3.6  CL 105 104  CO2 22 23  GLUCOSE 98 83  BUN 10 11  CREATININE 1.29* 1.00  CALCIUM 9.1 8.8   Liver Function Tests:  Recent Labs Lab 05/24/13 1810 05/26/13 0632  AST 32 33  ALT 25 24  ALKPHOS 73 67  BILITOT 0.3 0.3  PROT 8.0 8.2  ALBUMIN 2.4* 2.5*   No results found for this basename: LIPASE, AMYLASE,  in the last 168 hours No results found for this basename: AMMONIA,  in the last 168 hours CBC:  Recent Labs Lab 05/24/13 1810 05/25/13 1413 05/26/13 0632  WBC 9.6 10.3 7.9   NEUTROABS 5.4  --   --   HGB 5.2* 9.1* 8.5*  HCT 15.2* 25.5* 24.1*  MCV 84.0 81.7 82.0  PLT 405* 357 310   Cardiac Enzymes: No results found for this basename: CKTOTAL, CKMB, CKMBINDEX, TROPONINI,  in the last 168 hours BNP (last 3 results) No results found for this basename: PROBNP,  in the last 8760 hours CBG: No results found for this basename: GLUCAP,  in the last 168 hours  No results found for this or any previous visit (from the past 240 hour(s)).   Studies: No results found.  Scheduled Meds: . abacavir  600 mg Oral Daily  . amLODipine  10 mg Oral Daily  . azithromycin  1,200 mg Oral Q7 days  . dextromethorphan  30 mg Oral BID  . dolutegravir  50 mg Oral Daily  . ethambutol  1,600 mg Oral Daily  . isoniazid  300 mg Oral Daily  . lamiVUDine  300 mg Oral Daily  . meropenem (MERREM) 1 GM IVPB  1 g Intravenous Q12H  . pyridOXINE  50 mg Oral Daily  . rifabutin  300 mg Oral Daily   Continuous Infusions:   Principal Problem:   Normocytic anemia Active Problems:   Pneumonia with lung abscess   Acute kidney injury   Human immunodeficiency virus (HIV) disease   Pulmonary tuberculosis    Time spent: 35 min   [facility]  [facility] Pager [phone].  If 7PM-7AM, please contact night-coverage at [email or web address], password [facility] 05/26/2013, 1:04 PM  LOS: 2 days

## 2013-05-26 NOTE — Progress Notes (Signed)
INFECTIOUS DISEASE PROGRESS NOTE  ID: Monique [name] is a 42 y.o. female with  Principal Problem:   Normocytic anemia Active Problems:   Pneumonia with lung abscess   Acute kidney injury   Human immunodeficiency virus (HIV) disease   Pulmonary tuberculosis  Subjective: Mild headache, just woke up from nap  Abtx:  Anti-infectives   Start     Dose/Rate Route Frequency Ordered Stop   05/26/13 0000  azithromycin (ZITHROMAX) tablet 1,200 mg     1,200 mg Oral Every 7 days 05/24/13 2330     05/25/13 1000  ethambutol (MYAMBUTOL) tablet 1,600 mg     1,600 mg Oral Daily 05/24/13 2330     05/25/13 1000  abacavir-lamiVUDine (EPZICOM) 600-300 MG per tablet 1 tablet  Status:  Discontinued     1 tablet Oral Daily 05/24/13 2330 05/25/13 0857   05/25/13 1000  dolutegravir (TIVICAY) tablet 50 mg     50 mg Oral Daily 05/24/13 2330     05/25/13 1000  isoniazid (NYDRAZID) tablet 300 mg     300 mg Oral Daily 05/24/13 2330     05/25/13 1000  rifabutin (MYCOBUTIN) capsule 300 mg     300 mg Oral Daily 05/24/13 2330     05/25/13 1000  abacavir (ZIAGEN) tablet 600 mg     600 mg Oral Daily 05/25/13 0857     05/25/13 1000  lamiVUDine (EPIVIR) tablet 300 mg     300 mg Oral Daily 05/25/13 0857     05/24/13 2359  meropenem (MERREM) 1 g in sodium chloride 0.9 % 100 mL IVPB     1 g 200 mL/hr over 30 Minutes Intravenous Every 12 hours 05/24/13 2330     05/24/13 2345  azithromycin (ZITHROMAX) tablet 1,200 mg  Status:  Discontinued     1,200 mg Oral Every 7 days 05/24/13 2330 05/24/13 2334      Medications:  Scheduled: . abacavir  600 mg Oral Daily  . amLODipine  10 mg Oral Daily  . azithromycin  1,200 mg Oral Q7 days  . dextromethorphan  30 mg Oral BID  . dolutegravir  50 mg Oral Daily  . ethambutol  1,600 mg Oral Daily  . isoniazid  300 mg Oral Daily  . lamiVUDine  300 mg Oral Daily  . meropenem (MERREM) 1 GM IVPB  1 g Intravenous Q12H  . pyridOXINE  50 mg Oral Daily  . rifabutin  300 mg  Oral Daily    Objective: Vital signs in last 24 hours: Temp:  [98.4 F (36.9 C)-98.8 F (37.1 C)] 98.6 F (37 C) (09/30 2956) Pulse Rate:  [103-126] 103 (09/30 0638) Resp:  [18-19] 18 (09/30 0638) BP: (136-184)/(95-108) 136/95 mmHg (09/30 1012) SpO2:  [96 %-98 %] 96 % (09/30 2130)   General appearance: alert, cooperative and no distress Resp: clear to auscultation bilaterally Cardio: regular rate and rhythm GI: normal findings: bowel sounds normal and soft, non-tender  Lab Results  Recent Labs  05/24/13 1810 05/25/13 1413 05/26/13 0632  WBC 9.6 10.3 7.9  HGB 5.2* 9.1* 8.5*  HCT 15.2* 25.5* 24.1*  NA 137  --  137  K 3.8  --  3.6  CL 105  --  104  CO2 22  --  23  BUN 10  --  11  CREATININE 1.29*  --  1.00   Liver Panel  Recent Labs  05/24/13 1810 05/26/13 0632  PROT 8.0 8.2  ALBUMIN 2.4* 2.5*  AST 32 33  ALT 25  24  ALKPHOS 73 67  BILITOT 0.3 0.3   Sedimentation Rate No results found for this basename: ESRSEDRATE,  in the last 72 hours C-Reactive Protein No results found for this basename: CRP,  in the last 72 hours  Microbiology: No results found for this or any previous visit (from the past 240 hour(s)).  Studies/Results: No results found.   Assessment/Plan: Anemia  Lung Abscess AIDS (CD4 70 04-10-13)  Total days of antibiotics:  E/R/P/I/B6  ABC/3TC/DTGV  Merrem/Vanco- (day 47)  vanco has been on hold due to ARF, elevated Vanco Tr Will restart now that Cr has normalized.  Will restart her PZA, not sure why stopped.  My great appreciation to pharmacy         Johny Sax Infectious Diseases (pager) [phone] [email or web address] 05/26/2013, 3:00 PM  LOS: 2 days

## 2013-05-27 ENCOUNTER — Encounter: Payer: Self-pay | Admitting: Infectious Disease

## 2013-05-27 ENCOUNTER — Ambulatory Visit: Payer: Self-pay | Admitting: Infectious Disease

## 2013-05-27 ENCOUNTER — Inpatient Hospital Stay (HOSPITAL_COMMUNITY): Payer: Self-pay

## 2013-05-27 ENCOUNTER — Encounter (HOSPITAL_COMMUNITY): Payer: Self-pay | Admitting: *Deleted

## 2013-05-27 DIAGNOSIS — J189 Pneumonia, unspecified organism: Secondary | ICD-10-CM

## 2013-05-27 DIAGNOSIS — J852 Abscess of lung without pneumonia: Secondary | ICD-10-CM

## 2013-05-27 LAB — CBC
HCT: 24.1 % — ABNORMAL LOW (ref 36.0–46.0)
MCH: 28.9 pg (ref 26.0–34.0)
MCHC: 34.9 g/dL (ref 30.0–36.0)
MCV: 82.8 fL (ref 78.0–100.0)
Platelets: 288 10*3/uL (ref 150–400)
RBC: 2.91 MIL/uL — ABNORMAL LOW (ref 3.87–5.11)
RDW: 17.5 % — ABNORMAL HIGH (ref 11.5–15.5)
WBC: 7.3 10*3/uL (ref 4.0–10.5)

## 2013-05-27 LAB — BASIC METABOLIC PANEL
BUN: 10 mg/dL (ref 6–23)
Calcium: 8.3 mg/dL — ABNORMAL LOW (ref 8.4–10.5)
Chloride: 105 mEq/L (ref 96–112)
Creatinine, Ser: 1.02 mg/dL (ref 0.50–1.10)
GFR calc Af Amer: 77 mL/min — ABNORMAL LOW (ref >90)
GFR calc non Af Amer: 67 mL/min — ABNORMAL LOW (ref >90)

## 2013-05-27 MED ORDER — AMOXICILLIN-POT CLAVULANATE 875-125 MG PO TABS: 1.0000 | ORAL_TABLET | Freq: Two times a day (BID) | ORAL | Status: DC

## 2013-05-27 MED ORDER — SODIUM CHLORIDE 0.9 % IV SOLN: 1.0000 g | Freq: Two times a day (BID) | INTRAVENOUS | Status: DC

## 2013-05-27 MED ORDER — PYRAZINAMIDE 500 MG PO TABS: 2000.0000 mg | ORAL_TABLET | Freq: Every day | ORAL | Status: DC

## 2013-05-27 MED ORDER — VANCOMYCIN HCL IN DEXTROSE 1-5 GM/200ML-% IV SOLN: 1000.0000 mg | Freq: Two times a day (BID) | INTRAVENOUS | Status: DC

## 2013-05-27 MED ORDER — AMLODIPINE BESYLATE 10 MG PO TABS: 10.0000 mg | ORAL_TABLET | Freq: Every day | ORAL | Status: DC

## 2013-05-27 NOTE — Discharge Summary (Signed)
Physician Discharge Summary  Monique [name]:[id number] DOB: October 13, 1970 DOA: 05/24/2013  PCP: Pcp Not In System  Admit date: 05/24/2013 Discharge date: 05/27/2013  Time spent: 60 minutes  Recommendations for Outpatient Follow-up:  1. Follow up in ID clinic within 2 weeks 2. Stop IV antibiotics and start Augmentin 3. At follow up appointment she will need a repeat CBC  Discharge Diagnoses:  Principal Problem:   Normocytic anemia Active Problems:   Pneumonia with lung abscess   Acute kidney injury   Human immunodeficiency virus (HIV) disease   Pulmonary tuberculosis   Discharge Condition: good  Diet recommendation: Regular  Filed Weights   05/25/13 0124  Weight: 72.576 kg (160 lb)    History of present illness:  42 yo female presented on 9/28 from ID clinic due to acute on chronic anemia.  She had been recently hospitalized 04/09/13 with new dx of lung absess/resp failure/sepsis new HIV/AIDS diagnosis being treated for both TB (for a month, daily meds given by health dept) and bacterial pna by Dr. Daiva [name]. She was started on HIV treatment about one week ago. Her baseline hgb was around 9 initially, labs were drawn today and her hgb had dropped to below 6. mcv nml. Pt was sent here for anemia. She has been dizzy and fatigued.   Hospital Course:  1) Normocytic anemia:  Initially admitted to SDU due to abrupt drop in hgb from 9 to 5.2.  No source of bleed was identified. Labs showed normocytic anemia without iron deficiency. She was transfused 2 units of PRBCs and had expected rise of hgb to 9.1.  hgb was stable during admission.  Anemia is suspected to be multifactorial and due to immune reconstitution after recent initiation of HIV therapy, medications (ETH and rifabutin).   2) L Lung cavitary pulmonary TB: no respiratory distress during admission. Will continue on priror regimen.  Note that she was also receiving treatment for possible bacterial infection and this regimen was  changed from Vancomycin and Meropenem to Augmentin at discharge per ID recommendations.  The health department was aware of her hospitalization. 3) HIV: no change to regimen. 4) Acute kidney injury: Cr back to baseline at discharge  Procedures:  none  Consultations:  Infectious disease: Dr Ninetta Lights and Dr. Luciana Axe  Discharge Exam: Filed Vitals:   05/27/13 1300  BP: 139/87  Pulse: 105  Temp: 98.1 F (36.7 C)  Resp: 18    General: alert, cooperative, energetic Cardiovascular: RRR no mrg Respiratory: good air movement, clear, no wheezes or rhonchi EXT: no edema  Discharge Instructions  Discharge Orders   Future Orders Complete By Expires   Activity as tolerated - No restrictions  As directed    Call MD for:  extreme fatigue  As directed    Call MD for:  persistant dizziness or light-headedness  As directed    Call MD for:  temperature >100.4  As directed    Diet general  As directed    Discharge instructions  As directed    Comments:     Make appointment with ID clinic within one week.       Medication List    STOP taking these medications       sodium chloride 0.9 % SOLN 100 mL with meropenem 1 G SOLR 1 g      TAKE these medications       abacavir-lamiVUDine 600-300 MG per tablet  Commonly known as:  EPZICOM  Take 1 tablet by mouth daily.  amLODipine 10 MG tablet  Commonly known as:  NORVASC  Take 1 tablet (10 mg total) by mouth daily.     amoxicillin-clavulanate 875-125 MG per tablet  Commonly known as:  AUGMENTIN  Take 1 tablet by mouth 2 (two) times daily with a meal.     azithromycin 600 MG tablet  Commonly known as:  ZITHROMAX  Take 1,200 mg by mouth every 7 (seven) days. Tuesday     dextromethorphan 30 MG/5ML liquid  Commonly known as:  DELSYM  Take 5 mLs (30 mg total) by mouth 2 (two) times daily.     dolutegravir 50 MG tablet  Commonly known as:  TIVICAY  Take 1 tablet (50 mg total) by mouth daily.     ethambutol 400 MG tablet   Commonly known as:  MYAMBUTOL  Take 1,600 mg by mouth daily.     HYDROcodone-homatropine 5-1.5 MG/5ML syrup  Commonly known as:  HYCODAN  Take 5 mLs by mouth every 6 (six) hours as needed for cough.     isoniazid 300 MG tablet  Commonly known as:  NYDRAZID  Take 1 tablet (300 mg total) by mouth daily.     pyrazinamide 500 MG tablet  Take 4 tablets (2,000 mg total) by mouth daily.     pyridOXINE 50 MG tablet  Commonly known as:  B-6  Take 1 tablet (50 mg total) by mouth daily.     rifabutin 150 MG capsule  Commonly known as:  MYCOBUTIN  Take 2 capsules (300 mg total) by mouth daily.       No Known Allergies    The results of significant diagnostics from this hospitalization (including imaging, microbiology, ancillary and laboratory) are listed below for reference.    Significant Diagnostic Studies: Dg Chest 2 View  05/27/2013   CLINICAL DATA:  Re-evaluate lung abscess. Cough.  EXAM: CHEST  2 VIEW  COMPARISON:  Chest radiograph, 05/12/2013. Chest CT, 04/09/2013.  FINDINGS: Localized air surrounded by consolidated left upper lobe is noted on the left. The localized air has decreased in size from the prior exam measuring 7.3 cm from superior to inferior where it had measured 10.1 cm. The surrounding consolidated lung is stable.  The right lung is clear. No pleural effusion or pneumothorax.  Cardiac silhouette is normal in size and configuration. The mediastinum is normal in contour.  Right PICC has its tip in the lower superior vena cava, stable.  The bony thorax is unremarkable.  IMPRESSION: Decreased size of left upper lobe focal air density, surrounded by stable lung consolidation. This suggests mild improvement in the left upper lobe necrotic cavity since the prior exam. No new lung opacity is seen to suggest new pneumonia. No pneumothorax.   Electronically Signed   By: Amie Portland   On: 05/27/2013 11:11   Dg Chest 2 View  05/12/2013   CLINICAL DATA:  Cough for 2 weeks.  Persistent fevers. Previous testing suggests the patient has a left lung abscess.  EXAM: CHEST  2 VIEW  COMPARISON:  04/29/2013  FINDINGS: Two views of the chest were obtained. Again noted is a very large cavitary lesion in the left lung. Cavitary portion measures up to 10 cm and previously measured 8.7 cm. There are persistent peripheral opacities around the cavitary lesion which have minimally changed. PICC line tip is in the lower SVC. Heart and mediastinum are stable. Right lung remains clear. No evidence for pleural fluid.  IMPRESSION: Enlargement of the cavitary lesion in the left lung.  Electronically Signed   By: Richarda Overlie M.D.   On: 05/12/2013 16:16   Dg Chest 2 View  04/29/2013   CLINICAL DATA:  Pneumonia, lung abscess.  EXAM: CHEST  2 VIEW  COMPARISON:  04/18/2013  FINDINGS: Again noted is the cavitary airspace opacity in the left upper lobe. The amount of surrounding airspace disease has decreased slightly, otherwise no change. Right lung is clear. Right PICC line is in place with the tip near the cavoatrial junction. Heart is normal size. No effusions.  IMPRESSION: Continued cavitary area and consolidation in the left upper lobe with some improvement in surrounding consolidation.   Electronically Signed   By: Charlett Nose   On: 04/29/2013 14:20    Microbiology: No results found for this or any previous visit (from the past 240 hour(s)).   Labs: Basic Metabolic Panel:  Recent Labs Lab 05/24/13 1810 05/26/13 0632 05/27/13 0459  NA 137 137 137  K 3.8 3.6 3.7  CL 105 104 105  CO2 22 23 23   GLUCOSE 98 83 84  BUN 10 11 10   CREATININE 1.29* 1.00 1.02  CALCIUM 9.1 8.8 8.3*   Liver Function Tests:  Recent Labs Lab 05/24/13 1810 05/26/13 0632  AST 32 33  ALT 25 24  ALKPHOS 73 67  BILITOT 0.3 0.3  PROT 8.0 8.2  ALBUMIN 2.4* 2.5*   No results found for this basename: LIPASE, AMYLASE,  in the last 168 hours No results found for this basename: AMMONIA,  in the last 168  hours CBC:  Recent Labs Lab 05/24/13 1810 05/25/13 1413 05/26/13 0632 05/27/13 0459  WBC 9.6 10.3 7.9 7.3  NEUTROABS 5.4  --   --   --   HGB 5.2* 9.1* 8.5* 8.4*  HCT 15.2* 25.5* 24.1* 24.1*  MCV 84.0 81.7 82.0 82.8  PLT 405* 357 310 288   Cardiac Enzymes: No results found for this basename: CKTOTAL, CKMB, CKMBINDEX, TROPONINI,  in the last 168 hours BNP: BNP (last 3 results) No results found for this basename: PROBNP,  in the last 8760 hours CBG: No results found for this basename: GLUCAP,  in the last 168 hours     Signed:  Adhrit Krenz  [facility] 05/27/2013, 4:39 PM

## 2013-05-27 NOTE — Progress Notes (Signed)
[facility]  Date of Admission:  05/24/2013  Antibiotics: Antibiotics Given (last 72 hours)   Date/Time Action Medication Dose Rate   05/25/13 0330 Given   meropenem (MERREM) 1 g in sodium chloride 0.9 % 100 mL IVPB 1 g 200 mL/hr   05/25/13 1000 Given   lamiVUDine (EPIVIR) tablet 300 mg 300 mg    05/25/13 1024 Given   abacavir (ZIAGEN) tablet 600 mg 600 mg    05/25/13 1025 Given   ethambutol (MYAMBUTOL) tablet 1,600 mg 1,600 mg    05/25/13 1025 Given   dolutegravir (TIVICAY) tablet 50 mg 50 mg    05/25/13 1025 Given   isoniazid (NYDRAZID) tablet 300 mg 300 mg    05/25/13 1025 Given   rifabutin (MYCOBUTIN) capsule 300 mg 300 mg    05/25/13 1034 Given   meropenem (MERREM) 1 g in sodium chloride 0.9 % 100 mL IVPB 1 g 200 mL/hr   05/25/13 2220 Given   meropenem (MERREM) 1 g in sodium chloride 0.9 % 100 mL IVPB 1 g 200 mL/hr   05/25/13 2338 Given   azithromycin (ZITHROMAX) tablet 1,200 mg 1,200 mg    05/26/13 1013 Given   rifabutin (MYCOBUTIN) capsule 300 mg 300 mg    05/26/13 1014 Given   ethambutol (MYAMBUTOL) tablet 1,600 mg 1,600 mg    05/26/13 1014 Given   meropenem (MERREM) 1 g in sodium chloride 0.9 % 100 mL IVPB 1 g 200 mL/hr   05/26/13 1014 Given   dolutegravir (TIVICAY) tablet 50 mg 50 mg    05/26/13 1014 Given   lamiVUDine (EPIVIR) tablet 300 mg 300 mg    05/26/13 1024 Given   isoniazid (NYDRAZID) tablet 300 mg 300 mg    05/26/13 1024 Given   abacavir (ZIAGEN) tablet 600 mg 600 mg    05/26/13 1733 Given   pyrazinamide tablet 2,000 mg 2,000 mg    05/26/13 1734 Given   vancomycin (VANCOCIN) IVPB 1000 mg/200 mL premix 1,000 mg 200 mL/hr   05/26/13 2159 Given   meropenem (MERREM) 1 g in sodium chloride 0.9 % 100 mL IVPB 1 g 200 mL/hr   05/27/13 0609 Given   vancomycin (VANCOCIN) IVPB 1000 mg/200 mL premix 1,000 mg 200 mL/hr   05/27/13 1517 Given   rifabutin (MYCOBUTIN) capsule 300 mg 300 mg       Subjective: No  complaints  Objective: Temp:  [97.7 F (36.5 C)-98.4 F (36.9 C)] 98.1 F (36.7 C) (10/01 1300) Pulse Rate:  [98-113] 105 (10/01 1300) Resp:  [17-18] 18 (10/01 1300) BP: (133-152)/(86-106) 139/87 mmHg (10/01 1300) SpO2:  [96 %-98 %] 98 % (10/01 1300)  General: Awake, alert, nad Skin: no rashes Lungs: CTA B Cor: RRR without m/r/g Abdomen: soft, nt, nd, +bs Ext: no edema  Lab Results Lab Results  Component Value Date   WBC 7.3 05/27/2013   HGB 8.4* 05/27/2013   HCT 24.1* 05/27/2013   MCV 82.8 05/27/2013   PLT 288 05/27/2013    Lab Results  Component Value Date   CREATININE 1.02 05/27/2013   BUN 10 05/27/2013   NA 137 05/27/2013   K 3.7 05/27/2013   CL 105 05/27/2013   CO2 23 05/27/2013    Lab Results  Component Value Date   ALT 24 05/26/2013   AST 33 05/26/2013   ALKPHOS 67 05/26/2013   BILITOT 0.3 05/26/2013      Microbiology: No results found for this or any previous visit (from the past 240  hour(s)).  Studies/Results: Dg Chest 2 View  05/27/2013   CLINICAL DATA:  Re-evaluate lung abscess. Cough.  EXAM: CHEST  2 VIEW  COMPARISON:  Chest radiograph, 05/12/2013. Chest CT, 04/09/2013.  FINDINGS: Localized air surrounded by consolidated left upper lobe is noted on the left. The localized air has decreased in size from the prior exam measuring 7.3 cm from superior to inferior where it had measured 10.1 cm. The surrounding consolidated lung is stable.  The right lung is clear. No pleural effusion or pneumothorax.  Cardiac silhouette is normal in size and configuration. The mediastinum is normal in contour.  Right PICC has its tip in the lower superior vena cava, stable.  The bony thorax is unremarkable.  IMPRESSION: Decreased size of left upper lobe focal air density, surrounded by stable lung consolidation. This suggests mild improvement in the left upper lobe necrotic cavity since the prior exam. No new lung opacity is seen to suggest new pneumonia. No pneumothorax.   Electronically  Signed   By: Amie Portland   On: 05/27/2013 11:11    Assessment/Plan: 1) Cavitary pneumonia - has remained culture negative.  Some Candida in August but not c/w pathogenic organism in this case.  She was started on empiric IV therapy with concern of worsening but now CXR improved. -change her to Augmentin and she will need a prolonged course (until resolution of CXR) -d/c vancomycin and meropenem -d/c picc line  2) HIV/AIDS - continue with full dose Tivicay 50 mg po daily and Epzicom 1 pill daily  3) TB r/o - has remained negative.  Has about 2 weeks remaining of 2 months of therapy which will be converted to LTB treatment if AFB remains negative.   -at this point with culture negative, she does not need isolation but while in hospital, IP suggests isolation and so will continue while here  We will arrange follow up with Dr. Daiva Eves in 1-2 weeks.    Call with questions.    Staci Righter, MD [facility] [email or web address] [phone] pager   [phone] cell 05/27/2013, 3:35 PM

## 2013-05-27 NOTE — Progress Notes (Signed)
PICC removed by Lazarus Gowda, RN.

## 2013-05-28 ENCOUNTER — Telehealth: Payer: Self-pay | Admitting: *Deleted

## 2013-05-28 LAB — TYPE AND SCREEN
ABO/RH(D): A POS
Unit division: 0
Unit division: 0

## 2013-05-28 NOTE — Progress Notes (Signed)
Discharge instructions reviewed with patient. She is able to verbalize new plan for p.o. antibiotics. Printed AVS and prescriptions given to patient. Patient discharged to home via wheelchair.

## 2013-05-28 NOTE — Telephone Encounter (Signed)
Contacted Maryanna Shape, TB RN at [facility] [phone]) to let them know that the patient has been d/c'd from the hospital and to see if pt is still being followed by them for completion of TB treatment. Per Dr. Daiva Eves, he would also like to know how long she was on medications.  Andree Coss, RN   ----- Message ----- From: Renelda Mom Sent: 05/28/2013 9:33 AM To: Randall Hiss, MD, Gardiner Barefoot, MD, * Hi Dr. Luciana Axe, I have scheduled the patient with Dr. Daiva Eves on 10/10. When I spoke to the patient, she states either she lost or was not given the Isoniazid and Ethambutol. She doesn't know if she's supposed to continue or not. If so, can we call those in for her? Thanks Asher Muir

## 2013-05-29 ENCOUNTER — Telehealth: Payer: Self-pay | Admitting: *Deleted

## 2013-05-29 NOTE — Telephone Encounter (Signed)
Spoke with Irving Burton, RN at the health department. They were unaware of the patient's discharge, will get in touch with her to resume daily treatment.

## 2013-05-29 NOTE — Telephone Encounter (Signed)
error 

## 2013-05-31 LAB — AFB CULTURE WITH SMEAR (NOT AT ARMC): Acid Fast Smear: NONE SEEN

## 2013-06-05 ENCOUNTER — Encounter: Payer: Self-pay | Admitting: Infectious Disease

## 2013-06-05 ENCOUNTER — Ambulatory Visit (INDEPENDENT_AMBULATORY_CARE_PROVIDER_SITE_OTHER): Payer: MEDICAID | Admitting: Infectious Disease

## 2013-06-05 VITALS — BP 149/100 | HR 109 | Temp 97.9°F | Wt 172.0 lb

## 2013-06-05 DIAGNOSIS — B20 Human immunodeficiency virus [HIV] disease: Secondary | ICD-10-CM

## 2013-06-05 DIAGNOSIS — I10 Essential (primary) hypertension: Secondary | ICD-10-CM

## 2013-06-05 DIAGNOSIS — N179 Acute kidney failure, unspecified: Secondary | ICD-10-CM

## 2013-06-05 DIAGNOSIS — D649 Anemia, unspecified: Secondary | ICD-10-CM

## 2013-06-05 DIAGNOSIS — A159 Respiratory tuberculosis unspecified: Secondary | ICD-10-CM

## 2013-06-05 DIAGNOSIS — A150 Tuberculosis of lung: Secondary | ICD-10-CM

## 2013-06-05 DIAGNOSIS — J852 Abscess of lung without pneumonia: Secondary | ICD-10-CM

## 2013-06-05 LAB — CBC WITH DIFFERENTIAL/PLATELET
Basophils Relative: 1 % (ref 0–1)
Eosinophils Absolute: 0.3 10*3/uL (ref 0.0–0.7)
Eosinophils Relative: 5 % (ref 0–5)
Lymphs Abs: 1.6 10*3/uL (ref 0.7–4.0)
MCH: 28.9 pg (ref 26.0–34.0)
MCHC: 34.2 g/dL (ref 30.0–36.0)
Neutro Abs: 2.9 10*3/uL (ref 1.7–7.7)
Neutrophils Relative %: 48 % (ref 43–77)
Platelets: 321 10*3/uL (ref 150–400)
RBC: 3.36 MIL/uL — ABNORMAL LOW (ref 3.87–5.11)
RDW: 18.2 % — ABNORMAL HIGH (ref 11.5–15.5)
WBC: 6 10*3/uL (ref 4.0–10.5)

## 2013-06-05 LAB — COMPLETE METABOLIC PANEL WITH GFR
ALT: 25 U/L (ref 0–35)
Albumin: 3.6 g/dL (ref 3.5–5.2)
Alkaline Phosphatase: 60 U/L (ref 39–117)
CO2: 24 mEq/L (ref 19–32)
Calcium: 9.7 mg/dL (ref 8.4–10.5)
Chloride: 103 mEq/L (ref 96–112)
Creat: 1.11 mg/dL — ABNORMAL HIGH (ref 0.50–1.10)
GFR, Est African American: 71 mL/min
Potassium: 4 mEq/L (ref 3.5–5.3)
Total Protein: 9.1 g/dL — ABNORMAL HIGH (ref 6.0–8.3)

## 2013-06-05 LAB — T-HELPER CELL (CD4) - (RCID CLINIC ONLY)
CD4 % Helper T Cell: 26 % — ABNORMAL LOW (ref 33–55)
CD4 T Cell Abs: 430 /uL (ref 400–2700)

## 2013-06-05 MED ORDER — AMOXICILLIN-POT CLAVULANATE 875-125 MG PO TABS: 1.0000 | ORAL_TABLET | Freq: Two times a day (BID) | ORAL | Status: DC

## 2013-06-05 MED ORDER — ABACAVIR-DOLUTEGRAVIR-LAMIVUD 600-50-300 MG PO TABS: 1.0000 | ORAL_TABLET | Freq: Every day | ORAL | Status: DC

## 2013-06-05 MED ORDER — SULFAMETHOXAZOLE-TMP DS 800-160 MG PO TABS: 1.0000 | ORAL_TABLET | ORAL | Status: DC

## 2013-06-05 NOTE — Patient Instructions (Signed)
You need bloodwork today  I am changing your antivirals for HIV to simplify them  You will now be on   TRIUMEQ (which is combination of the epizcom and tivicay you were taking in one pill)  You need to start Bactrim DS one pill three times weekly  Continue the augmentin (AMox/CLAV) twice daily until we have reassessed your lungs  We need more blood work in one month and appt with [place] in 6 weeks

## 2013-06-05 NOTE — Progress Notes (Signed)
Subjective:    Patient ID: Monique [name], female    DOB: 1971/07/02, 42 y.o.   MRN: [id number]  HPI   42 year old Afro-American lady with newly diagnosed HIV AIDS also with lung abscess due to either bacteria or tuberculosis currently being treated for both with IV antibiotics and for drug therapy via the [facility] department.  She underwent bronchoscopy with BAL 3-4 days into vanco/unsasyn and this failed to yield any bacterial organisms, AFB stain was negative and cultures negative FINAL. Respiratory panel was negative PCP smear was negative. Some Candida albicans did grow from bronchoalveolar lavage. She also multiple sputum sent for AFB stain and culture these were all NEGATIVE FINAL. Bronchial V. lavage was sent for Mycobacterium tuberculosis by PCR testing and this was negative by PCR testing and culture NEGATIVE FINAL. She then had a lung biopsy performed by interventional radiology and specimens were sent to pathology and to microbiology,   Report showed nonspecific inflammation but no granulomas and no evidence of malignancy.   Cultures were and negative AFB stains and fungal stains were NEGATIVE FINAL   She [facility] with [facility] antibiotics with IV vancomycin being dosed for trough of 15-20 along with meropenem.   ibuprofen today after having a fever up to 103. I've asked her to not use that either.  She had problems with renal insufficiency while taking vancomycin along with her blood pressure medications and vancomycin was held along with her Bactrim. She then had fevers which were concerning and was seen on another occasion by myself. Chest x-ray has shown enlargement of her cavitary area.  Ultimately she was admitted after found to be severely anemic and received blood transfusion was stabilized and discharged home off of IV antibiotics but on oral Augmentin which she's been taking twice daily. She feels that she is improving  and she is gaining weight has increased appetite.  We have not had repeat check for viral load or CD4 count. I will simplify her antiretroviral medications to [facility] today.     Review of Systems  Constitutional: Negative for chills, diaphoresis, activity change, appetite change, fatigue and unexpected weight change.  HENT: Negative for rhinorrhea, sinus pressure, sneezing and trouble swallowing.   Eyes: Negative for photophobia and visual disturbance.  Respiratory: Negative for chest tightness, shortness of breath and stridor.   Cardiovascular: Negative for palpitations and leg swelling.  Gastrointestinal: Negative for constipation, blood in stool, abdominal distention and anal bleeding.  Genitourinary: Negative for dysuria, hematuria, flank pain and difficulty urinating.  Musculoskeletal: Negative for arthralgias, back pain, gait problem, joint swelling and myalgias.  Skin: Negative for color change, pallor and wound.  Neurological: Negative for dizziness, tremors, weakness and light-headedness.  Hematological: Negative for adenopathy. Does not bruise/bleed easily.  Psychiatric/Behavioral: Negative for behavioral problems, confusion, sleep disturbance, dysphoric mood, decreased concentration and agitation.       Objective:   Physical Exam  Constitutional: She is oriented to person, place, and time. She appears well-developed and well-nourished. No distress.  HENT:  Head: Normocephalic and atraumatic.  Mouth/Throat: Oropharynx is clear and moist. No oropharyngeal exudate.  Eyes: Conjunctivae and EOM are normal. Pupils are equal, round, and reactive to light. No scleral icterus.  Neck: Normal range of motion. Neck supple. No JVD present.  Cardiovascular: Normal rate, regular rhythm and normal heart sounds.  Exam reveals no gallop and no friction rub.   No murmur heard. Pulmonary/Chest: Effort normal. No respiratory distress. She has  decreased breath sounds in the left middle field  and the left lower field. She has no wheezes.  Abdominal: She exhibits no distension and no mass. There is no tenderness. There is no rebound and no guarding.  Musculoskeletal: She exhibits no edema and no tenderness.  Lymphadenopathy:    She has no cervical adenopathy.  Neurological: She is alert and oriented to person, place, and time. She has normal reflexes. She exhibits normal muscle tone. Coordination normal.  Skin: Skin is warm and dry. She is not diaphoretic. No erythema. No pallor.  Psychiatric: She has a normal mood and affect. Her behavior is normal. Judgment and thought content normal.          Assessment & Plan:  HIV/AIDS: -consolidate to TRIUMEQ --Recheck viral load and CD4 count today --Continue azithromycin for M avium  For avium prophylaxis --restart bactrim  3 times weekly Bactrim. after she's been on antiretroviral therapy for one month   Lung abscess likely due to bacterial pathogen --Continue oral Augmentin and I gave her another month's supply with refills.  See her one month's time and recheck  chest x-ray.   LTB vs Pulmonary TB: with all cultures negative FINAL, pcr negative, this is NOT  pulmonary TB. She will after having finished 2 months of 4 drug therapy have received a regimen for latent tuberculosis. Will to medicate with health department with regards to duration of therapy.  Acute renal insufficiency: Likely multifactorial had stabilized recently we'll recheck today.   Anemia: Uncertain of the cause we'll check her CBC again today.  HTN: continue amlodipine, likely need a 2nd drug

## 2013-06-08 LAB — HIV-1 RNA ULTRAQUANT REFLEX TO GENTYP+: HIV 1 RNA Quant: <20 copies/mL (ref <20)

## 2013-06-11 ENCOUNTER — Encounter: Payer: Self-pay | Admitting: Infectious Disease

## 2013-06-16 ENCOUNTER — Encounter: Payer: Self-pay | Admitting: Licensed Clinical Social Worker

## 2013-06-19 LAB — HIV-1 INTEGRASE GENOTYPE

## 2013-06-22 ENCOUNTER — Other Ambulatory Visit: Payer: Self-pay | Admitting: Infectious Disease

## 2013-06-22 ENCOUNTER — Ambulatory Visit
Admission: RE | Admit: 2013-06-22 | Discharge: 2013-06-22 | Disposition: A | Discharge status: Home / Self Care | Payer: No Typology Code available for payment source | Source: Ambulatory Visit | Attending: Infectious Disease | Admitting: Infectious Disease

## 2013-06-22 DIAGNOSIS — Z09 Encounter for follow-up examination after completed treatment for conditions other than malignant neoplasm: Secondary | ICD-10-CM

## 2013-06-25 ENCOUNTER — Other Ambulatory Visit: Payer: Self-pay | Admitting: Licensed Clinical Social Worker

## 2013-06-25 DIAGNOSIS — D649 Anemia, unspecified: Secondary | ICD-10-CM

## 2013-06-25 DIAGNOSIS — J852 Abscess of lung without pneumonia: Secondary | ICD-10-CM

## 2013-06-25 DIAGNOSIS — N179 Acute kidney failure, unspecified: Secondary | ICD-10-CM

## 2013-06-25 DIAGNOSIS — I10 Essential (primary) hypertension: Secondary | ICD-10-CM

## 2013-06-25 DIAGNOSIS — B20 Human immunodeficiency virus [HIV] disease: Secondary | ICD-10-CM

## 2013-06-25 DIAGNOSIS — A159 Respiratory tuberculosis unspecified: Secondary | ICD-10-CM

## 2013-06-25 MED ORDER — AMLODIPINE BESYLATE 10 MG PO TABS: 10.0000 mg | ORAL_TABLET | Freq: Every day | ORAL | Status: DC

## 2013-07-01 ENCOUNTER — Encounter: Payer: Self-pay | Admitting: Infectious Disease

## 2013-07-22 ENCOUNTER — Telehealth: Payer: Self-pay | Admitting: *Deleted

## 2013-07-22 NOTE — Telephone Encounter (Signed)
Pt agreed to PAP smear w/ MD visit 07/29/13.

## 2013-07-29 ENCOUNTER — Telehealth: Payer: Self-pay | Admitting: Infectious Disease

## 2013-07-29 ENCOUNTER — Ambulatory Visit: Payer: Self-pay | Admitting: Infectious Disease

## 2013-07-29 NOTE — Telephone Encounter (Signed)
Pt needs to reschedule with me next week. Friday is likely stil with appts. May need bridge counselor

## 2013-08-03 ENCOUNTER — Ambulatory Visit (HOSPITAL_COMMUNITY)
Admission: RE | Admit: 2013-08-03 | Discharge: 2013-08-03 | Disposition: A | Discharge status: Home / Self Care | Payer: Self-pay | Source: Ambulatory Visit | Attending: Infectious Disease | Admitting: Infectious Disease

## 2013-08-03 ENCOUNTER — Ambulatory Visit (INDEPENDENT_AMBULATORY_CARE_PROVIDER_SITE_OTHER): Payer: Self-pay | Admitting: Infectious Disease

## 2013-08-03 ENCOUNTER — Encounter: Payer: Self-pay | Admitting: Infectious Disease

## 2013-08-03 VITALS — BP 152/108 | HR 83 | Temp 98.1°F

## 2013-08-03 DIAGNOSIS — N179 Acute kidney failure, unspecified: Secondary | ICD-10-CM

## 2013-08-03 DIAGNOSIS — B20 Human immunodeficiency virus [HIV] disease: Secondary | ICD-10-CM

## 2013-08-03 DIAGNOSIS — J852 Abscess of lung without pneumonia: Secondary | ICD-10-CM | POA: Insufficient documentation

## 2013-08-03 DIAGNOSIS — I517 Cardiomegaly: Secondary | ICD-10-CM | POA: Insufficient documentation

## 2013-08-03 DIAGNOSIS — D649 Anemia, unspecified: Secondary | ICD-10-CM

## 2013-08-03 DIAGNOSIS — A159 Respiratory tuberculosis unspecified: Secondary | ICD-10-CM

## 2013-08-03 DIAGNOSIS — I10 Essential (primary) hypertension: Secondary | ICD-10-CM

## 2013-08-03 DIAGNOSIS — A150 Tuberculosis of lung: Secondary | ICD-10-CM

## 2013-08-03 LAB — CBC WITH DIFFERENTIAL/PLATELET
Basophils Relative: 0 % (ref 0–1)
Eosinophils Absolute: 0.1 10*3/uL (ref 0.0–0.7)
Hemoglobin: 11.6 g/dL — ABNORMAL LOW (ref 12.0–15.0)
MCH: 31.9 pg (ref 26.0–34.0)
MCHC: 34.8 g/dL (ref 30.0–36.0)
Monocytes Absolute: 0.6 10*3/uL (ref 0.1–1.0)
Monocytes Relative: 10 % (ref 3–12)
Neutro Abs: 3.9 10*3/uL (ref 1.7–7.7)
Neutrophils Relative %: 70 % (ref 43–77)
Platelets: 257 10*3/uL (ref 150–400)
RBC: 3.64 MIL/uL — ABNORMAL LOW (ref 3.87–5.11)
RDW: 15.5 % (ref 11.5–15.5)

## 2013-08-03 LAB — COMPLETE METABOLIC PANEL WITH GFR
CO2: 22 mEq/L (ref 19–32)
Calcium: 9.1 mg/dL (ref 8.4–10.5)
Chloride: 103 mEq/L (ref 96–112)
Creat: 0.93 mg/dL (ref 0.50–1.10)
GFR, Est African American: 88 mL/min
GFR, Est Non African American: 76 mL/min
Glucose, Bld: 76 mg/dL (ref 70–99)
Total Bilirubin: 0.3 mg/dL (ref 0.3–1.2)
Total Protein: 8.7 g/dL — ABNORMAL HIGH (ref 6.0–8.3)

## 2013-08-03 MED ORDER — AMOXICILLIN-POT CLAVULANATE 875-125 MG PO TABS: 1.0000 | ORAL_TABLET | Freq: Two times a day (BID) | ORAL | Status: DC

## 2013-08-03 MED ORDER — AMLODIPINE BESYLATE 10 MG PO TABS: 10.0000 mg | ORAL_TABLET | Freq: Every day | ORAL | Status: DC

## 2013-08-03 NOTE — Patient Instructions (Signed)
Come for RN visit and BP check next week

## 2013-08-03 NOTE — Progress Notes (Signed)
Subjective:    Patient ID: Monique [name], female    DOB: 1970-12-05, 42 y.o.   MRN: [id number]  HPI   42 year old Afro-American lady with newly diagnosed HIV AIDS also with lung abscess due to either bacteria or tuberculosis currently being treated for both with IV antibiotics and for drug therapy via the [facility] department.  She underwent bronchoscopy with BAL 3-4 days into vanco/unsasyn and this failed to yield any bacterial organisms, AFB stain was negative and cultures negative FINAL. Respiratory panel was negative PCP smear was negative. Some Candida albicans did grow from bronchoalveolar lavage. She also multiple sputum sent for AFB stain and culture these were all NEGATIVE FINAL. Bronchial . lavage was sent for Mycobacterium tuberculosis by PCR testing and this was negative by PCR testing and culture NEGATIVE FINAL. She then had a lung biopsy performed by interventional radiology and specimens were sent to pathology and to microbiology,   Report showed nonspecific inflammation but no granulomas and no evidence of malignancy.   Cultures were and negative AFB stains and fungal stains were NEGATIVE FINAL   She was sent home via [facility] with [facility] home care home antibiotics with IV vancomycin being dosed for trough of 15-20 along with meropenem.   ibuprofen today after having a fever up to 103. I've asked her to not use that either.  She had problems with renal insufficiency while taking vancomycin along with her blood pressure medications and vancomycin was held along with her Bactrim. She then had fevers which were concerning and was seen on another occasion by myself. Chest x-ray has shown enlargement of her cavitary area.  Ultimately she was admitted after found to be severely anemic and received blood transfusion was stabilized and discharged home off of IV antibiotics but on oral Augmentin which she's been taking twice daily.   I will simplified her  antiretroviral medications to TRIUMEQ.  Check labs in October her viral load is nondetectable her CD4 count was above 400.  She came to clinic today to be seen by RN before dental clinic for extraction of 2 teeth but unfortunately came to clinic half hour late her blood pressure was also elevated due to her not having taken her amlodipine this past week.  She's had increased oral pain this last week when she ran out of her oral Augmentin. Her breathing is stable to improved.  She denies any fevers.  She claims be highly compliant with her antiretrovirals. She is however out of her azithromycin. She is still taking Bactrim.       Review of Systems  Constitutional: Negative for chills, diaphoresis, activity change, appetite change, fatigue and unexpected weight change.  HENT: Positive for dental problem and facial swelling. Negative for rhinorrhea, sinus pressure, sneezing and trouble swallowing.   Eyes: Negative for photophobia and visual disturbance.  Respiratory: Negative for chest tightness, shortness of breath and stridor.   Cardiovascular: Negative for palpitations and leg swelling.  Gastrointestinal: Negative for constipation, blood in stool, abdominal distention and anal bleeding.  Genitourinary: Negative for dysuria, hematuria, flank pain and difficulty urinating.  Musculoskeletal: Negative for arthralgias, back pain, gait problem, joint swelling and myalgias.  Skin: Negative for color change, pallor and wound.  Neurological: Negative for dizziness, tremors, weakness and light-headedness.  Hematological: Negative for adenopathy. Does not bruise/bleed easily.  Psychiatric/Behavioral: Negative for behavioral problems, confusion, sleep disturbance, dysphoric mood, decreased concentration and agitation.       Objective:   Physical Exam  Constitutional: She  is oriented to person, place, and time. She appears well-developed and well-nourished. No distress.  HENT:  Head:  Normocephalic and atraumatic.  Mouth/Throat: Oropharynx is clear and moist. No oropharyngeal exudate.  Eyes: Conjunctivae and EOM are normal. Pupils are equal, round, and reactive to light. No scleral icterus.  Neck: Normal range of motion. Neck supple. No JVD present.  Cardiovascular: Normal rate, regular rhythm and normal heart sounds.  Exam reveals no gallop and no friction rub.   No murmur heard. Pulmonary/Chest: Effort normal. No respiratory distress. She has no wheezes.  Abdominal: She exhibits no distension and no mass. There is no tenderness. There is no rebound and no guarding.  Musculoskeletal: She exhibits no edema and no tenderness.  Lymphadenopathy:    She has no cervical adenopathy.  Neurological: She is alert and oriented to person, place, and time. She has normal reflexes. She exhibits normal muscle tone. Coordination normal.  Skin: Skin is warm and dry. She is not diaphoretic. No erythema. No pallor.  Psychiatric: She has a normal mood and affect. Her behavior is normal. Judgment and thought content normal.          Assessment & Plan:  HIV/AIDS: -Continue TRIUMEQ --Recheck viral load and CD4 count today, and in 2 months time make sure she follows up with our counselors to reenrolled into the ADAP program --DC bactrim unless she has become noncompliant and dropped her cd4 --dc azithromycin    Lung abscess likely due to bacterial pathogen --Continue oral Augmentin, check chest x-ray  Dental pain: Refilled Augmentin PCR an overdose again.  Hypertension refilled her amlodipine have her come to clinic to see nurse next week for blood pressure check.   LTB vs Pulmonary TB: with all cultures negative FINAL, pcr negative, this is NOT  pulmonary TB. She has finished TB therapy

## 2013-08-04 LAB — T-HELPER CELL (CD4) - (RCID CLINIC ONLY)
CD4 % Helper T Cell: 22 % — ABNORMAL LOW (ref 33–55)
CD4 T Cell Abs: 240 /uL — ABNORMAL LOW (ref 400–2700)

## 2013-08-05 NOTE — Progress Notes (Signed)
I called the patient to let her know to continue Augmentin.

## 2013-08-10 ENCOUNTER — Ambulatory Visit: Payer: Self-pay | Admitting: *Deleted

## 2013-08-10 VITALS — BP 145/94

## 2013-08-10 DIAGNOSIS — I10 Essential (primary) hypertension: Secondary | ICD-10-CM

## 2013-08-10 NOTE — Progress Notes (Signed)
Pt states she took her HTN meds just before coming in for a blood pressure check, but that it has been "much better" when she checks it at the drug store. Andree Coss, RN

## 2013-09-01 ENCOUNTER — Ambulatory Visit: Payer: Self-pay

## 2013-09-01 ENCOUNTER — Other Ambulatory Visit: Payer: Self-pay | Admitting: *Deleted

## 2013-09-01 DIAGNOSIS — N179 Acute kidney failure, unspecified: Secondary | ICD-10-CM

## 2013-09-01 DIAGNOSIS — A159 Respiratory tuberculosis unspecified: Secondary | ICD-10-CM

## 2013-09-01 DIAGNOSIS — I10 Essential (primary) hypertension: Secondary | ICD-10-CM

## 2013-09-01 DIAGNOSIS — J852 Abscess of lung without pneumonia: Secondary | ICD-10-CM

## 2013-09-01 DIAGNOSIS — B20 Human immunodeficiency virus [HIV] disease: Secondary | ICD-10-CM

## 2013-09-01 DIAGNOSIS — D649 Anemia, unspecified: Secondary | ICD-10-CM

## 2013-09-01 MED ORDER — ABACAVIR-DOLUTEGRAVIR-LAMIVUD 600-50-300 MG PO TABS: 1.0000 | ORAL_TABLET | Freq: Every day | ORAL | Status: DC

## 2013-09-18 ENCOUNTER — Other Ambulatory Visit (INDEPENDENT_AMBULATORY_CARE_PROVIDER_SITE_OTHER): Payer: Self-pay

## 2013-09-18 DIAGNOSIS — B20 Human immunodeficiency virus [HIV] disease: Secondary | ICD-10-CM

## 2013-09-18 DIAGNOSIS — N179 Acute kidney failure, unspecified: Secondary | ICD-10-CM

## 2013-09-18 DIAGNOSIS — A159 Respiratory tuberculosis unspecified: Secondary | ICD-10-CM

## 2013-09-18 DIAGNOSIS — D649 Anemia, unspecified: Secondary | ICD-10-CM

## 2013-09-18 DIAGNOSIS — I10 Essential (primary) hypertension: Secondary | ICD-10-CM

## 2013-09-18 DIAGNOSIS — J852 Abscess of lung without pneumonia: Secondary | ICD-10-CM

## 2013-09-18 LAB — COMPLETE METABOLIC PANEL WITH GFR
ALK PHOS: 42 U/L (ref 39–117)
ALT: 42 U/L — ABNORMAL HIGH (ref 0–35)
AST: 27 U/L (ref 0–37)
Albumin: 3.9 g/dL (ref 3.5–5.2)
BUN: 19 mg/dL (ref 6–23)
CO2: 19 meq/L (ref 19–32)
Calcium: 8.5 mg/dL (ref 8.4–10.5)
Chloride: 108 mEq/L (ref 96–112)
Creat: 1.01 mg/dL (ref 0.50–1.10)
GFR, EST AFRICAN AMERICAN: 79 mL/min
GFR, EST NON AFRICAN AMERICAN: 68 mL/min
GLUCOSE: 77 mg/dL (ref 70–99)
POTASSIUM: 4.2 meq/L (ref 3.5–5.3)
SODIUM: 135 meq/L (ref 135–145)
TOTAL PROTEIN: 8.7 g/dL — AB (ref 6.0–8.3)
Total Bilirubin: 0.4 mg/dL (ref 0.3–1.2)

## 2013-09-18 LAB — CBC WITH DIFFERENTIAL/PLATELET
Basophils Absolute: 0 10*3/uL (ref 0.0–0.1)
Basophils Relative: 0 % (ref 0–1)
Eosinophils Absolute: 0.1 10*3/uL (ref 0.0–0.7)
Eosinophils Relative: 1 % (ref 0–5)
HCT: 35.6 % — ABNORMAL LOW (ref 36.0–46.0)
HEMOGLOBIN: 12.1 g/dL (ref 12.0–15.0)
LYMPHS ABS: 1.6 10*3/uL (ref 0.7–4.0)
Lymphocytes Relative: 29 % (ref 12–46)
MCH: 30.6 pg (ref 26.0–34.0)
MCHC: 34 g/dL (ref 30.0–36.0)
MCV: 89.9 fL (ref 78.0–100.0)
MONOS PCT: 12 % (ref 3–12)
Monocytes Absolute: 0.6 10*3/uL (ref 0.1–1.0)
NEUTROS ABS: 3.1 10*3/uL (ref 1.7–7.7)
NEUTROS PCT: 58 % (ref 43–77)
Platelets: 214 10*3/uL (ref 150–400)
RBC: 3.96 MIL/uL (ref 3.87–5.11)
RDW: 15.1 % (ref 11.5–15.5)
WBC: 5.3 10*3/uL (ref 4.0–10.5)

## 2013-09-18 LAB — T-HELPER CELL (CD4) - (RCID CLINIC ONLY)
CD4 % Helper T Cell: 18 % — ABNORMAL LOW (ref 33–55)
CD4 T Cell Abs: 290 /uL — ABNORMAL LOW (ref 400–2700)

## 2013-09-20 LAB — HIV-1 RNA QUANT-NO REFLEX-BLD

## 2013-09-29 ENCOUNTER — Other Ambulatory Visit: Payer: Self-pay | Admitting: Licensed Clinical Social Worker

## 2013-09-29 DIAGNOSIS — N179 Acute kidney failure, unspecified: Secondary | ICD-10-CM

## 2013-09-29 DIAGNOSIS — A159 Respiratory tuberculosis unspecified: Secondary | ICD-10-CM

## 2013-09-29 DIAGNOSIS — J852 Abscess of lung without pneumonia: Secondary | ICD-10-CM

## 2013-09-29 DIAGNOSIS — I10 Essential (primary) hypertension: Secondary | ICD-10-CM

## 2013-09-29 DIAGNOSIS — D649 Anemia, unspecified: Secondary | ICD-10-CM

## 2013-09-29 DIAGNOSIS — B20 Human immunodeficiency virus [HIV] disease: Secondary | ICD-10-CM

## 2013-09-30 ENCOUNTER — Ambulatory Visit (INDEPENDENT_AMBULATORY_CARE_PROVIDER_SITE_OTHER): Payer: Self-pay | Admitting: Infectious Disease

## 2013-09-30 ENCOUNTER — Encounter: Payer: Self-pay | Admitting: Infectious Disease

## 2013-09-30 VITALS — BP 128/88 | HR 78 | Temp 98.2°F | Wt 186.0 lb

## 2013-09-30 DIAGNOSIS — Z23 Encounter for immunization: Secondary | ICD-10-CM

## 2013-09-30 DIAGNOSIS — Z227 Latent tuberculosis: Secondary | ICD-10-CM

## 2013-09-30 DIAGNOSIS — B20 Human immunodeficiency virus [HIV] disease: Secondary | ICD-10-CM

## 2013-09-30 DIAGNOSIS — R7611 Nonspecific reaction to tuberculin skin test without active tuberculosis: Secondary | ICD-10-CM

## 2013-09-30 DIAGNOSIS — I10 Essential (primary) hypertension: Secondary | ICD-10-CM

## 2013-09-30 DIAGNOSIS — J852 Abscess of lung without pneumonia: Secondary | ICD-10-CM

## 2013-09-30 NOTE — Patient Instructions (Signed)
We will give you hep a and b shots today  Make a RN visit for a repeat hep B (ie #2) in one month  Then rtc to see Dr. Daiva EvesVan Dam in 5 months with blood work prior to that appt

## 2013-09-30 NOTE — Progress Notes (Signed)
Subjective:    Patient ID: Monique [name], female    DOB: April 01, 1971, 43 y.o.   MRN: [id number]  HPI   43 year old African American lady with newly diagnosed HIV AIDS (this August) also with lung abscess . He was initially treated for both lung abscess and possibility of pulmonary tuberculosis. See prior notes for workup ultimately all data was negative for pulmonary tuberculosis and she did finish a course of therapy for latent tuberculosis.  She also had protracted Augmentin she still is taking to this day. Repeat chest x-ray in October showed resolution of her pneumonia but with residual cavity left over from his infection.   I will simplified her antiretroviral medications to [facility] and she is remain with undetectable viral load and CD4 count is been up above 200 mostly recently at 240 with a peak CD4 count above 400   Check labs in October her viral load is nondetectable her CD4 count was above 400.  She was seen bydental clinic for extraction of 2 teeth. She needs to be seen by them yet again. Otherwise she's doing well and is having no complaints  Her blood pressure is better controlled on amlodipine.  She has not been sexual active recently.      Review of Systems  Constitutional: Negative for chills, diaphoresis, activity change, appetite change, fatigue and unexpected weight change.  HENT: Positive for dental problem. Negative for facial swelling, rhinorrhea, sinus pressure, sneezing and trouble swallowing.   Eyes: Negative for photophobia and visual disturbance.  Respiratory: Negative for chest tightness, shortness of breath and stridor.   Cardiovascular: Negative for palpitations and leg swelling.  Gastrointestinal: Negative for constipation, blood in stool, abdominal distention and anal bleeding.  Genitourinary: Negative for dysuria, hematuria, flank pain and difficulty urinating.  Musculoskeletal: Negative for arthralgias, back pain, gait problem, joint swelling  and myalgias.  Skin: Negative for color change, pallor and wound.  Neurological: Negative for dizziness, tremors, weakness and light-headedness.  Hematological: Negative for adenopathy. Does not bruise/bleed easily.  Psychiatric/Behavioral: Negative for behavioral problems, confusion, sleep disturbance, dysphoric mood, decreased concentration and agitation.       Objective:   Physical Exam  Constitutional: She is oriented to person, place, and time. She appears well-developed and well-nourished. No distress.  HENT:  Head: Normocephalic and atraumatic.  Mouth/Throat: Oropharynx is clear and moist. No oropharyngeal exudate.  Eyes: Conjunctivae and EOM are normal. No scleral icterus.  Neck: Normal range of motion. Neck supple. No JVD present.  Cardiovascular: Normal rate, regular rhythm and normal heart sounds.  Exam reveals no gallop and no friction rub.   No murmur heard. Pulmonary/Chest: Effort normal. No respiratory distress. She has no wheezes.  Abdominal: She exhibits no distension and no mass. There is no tenderness. There is no rebound and no guarding.  Musculoskeletal: She exhibits no edema and no tenderness.  Lymphadenopathy:    She has no cervical adenopathy.  Neurological: She is alert and oriented to person, place, and time. She has normal reflexes. She exhibits normal muscle tone. Coordination normal.  Skin: Skin is warm and dry. She is not diaphoretic. No erythema. No pallor.  Psychiatric: She has a normal mood and affect. Her behavior is normal. Judgment and thought content normal.          Assessment & Plan:  HIV/AIDS: -Continue TRIUMEQ --reenrolled into the ADAP program through September.  Bring back in 5 months time with repeat viral load CD4 count and labs and renewal for the ADAP program in July  prior to September deadline  I spent greater than 25  minutes with the patient including greater than 50% of time in face to face counsel of the patient and in  coordination of their care.   Lung abscess likely due to bacterial pathogen --Discontinue Augmentin   Dental problems: To see dentist again.  Hypertension refilled her amlodipine   LTB vs Pulmonary TB: with all cultures negative FINAL, pcr negative, this is NOT  pulmonary TB. She has finished TB therapy

## 2013-10-02 ENCOUNTER — Encounter: Payer: Self-pay | Admitting: *Deleted

## 2013-10-28 ENCOUNTER — Ambulatory Visit: Payer: Self-pay

## 2013-11-02 ENCOUNTER — Ambulatory Visit (INDEPENDENT_AMBULATORY_CARE_PROVIDER_SITE_OTHER): Payer: Self-pay | Admitting: *Deleted

## 2013-11-02 DIAGNOSIS — Z23 Encounter for immunization: Secondary | ICD-10-CM

## 2013-11-06 ENCOUNTER — Ambulatory Visit: Payer: Self-pay

## 2013-11-06 ENCOUNTER — Telehealth: Payer: Self-pay | Admitting: *Deleted

## 2013-11-06 NOTE — Telephone Encounter (Signed)
Made new appt.  Pt did not have transportation.

## 2013-11-13 ENCOUNTER — Ambulatory Visit: Payer: Self-pay

## 2014-02-05 ENCOUNTER — Other Ambulatory Visit: Payer: Self-pay | Admitting: Licensed Clinical Social Worker

## 2014-02-05 DIAGNOSIS — D649 Anemia, unspecified: Secondary | ICD-10-CM

## 2014-02-05 DIAGNOSIS — A159 Respiratory tuberculosis unspecified: Secondary | ICD-10-CM

## 2014-02-05 DIAGNOSIS — B20 Human immunodeficiency virus [HIV] disease: Secondary | ICD-10-CM

## 2014-02-05 DIAGNOSIS — N179 Acute kidney failure, unspecified: Secondary | ICD-10-CM

## 2014-02-05 DIAGNOSIS — J852 Abscess of lung without pneumonia: Secondary | ICD-10-CM

## 2014-02-05 DIAGNOSIS — I10 Essential (primary) hypertension: Secondary | ICD-10-CM

## 2014-02-05 MED ORDER — ABACAVIR-DOLUTEGRAVIR-LAMIVUD 600-50-300 MG PO TABS: 1.0000 | ORAL_TABLET | Freq: Every day | ORAL | Status: DC

## 2014-02-23 ENCOUNTER — Other Ambulatory Visit: Payer: Self-pay

## 2014-02-24 ENCOUNTER — Other Ambulatory Visit: Payer: Self-pay

## 2014-03-10 ENCOUNTER — Ambulatory Visit: Payer: Self-pay | Admitting: Infectious Disease

## 2014-03-16 ENCOUNTER — Encounter: Payer: Self-pay | Admitting: Infectious Disease

## 2014-03-16 ENCOUNTER — Ambulatory Visit (INDEPENDENT_AMBULATORY_CARE_PROVIDER_SITE_OTHER): Payer: Self-pay | Admitting: Infectious Disease

## 2014-03-16 ENCOUNTER — Other Ambulatory Visit: Payer: Self-pay | Admitting: Infectious Disease

## 2014-03-16 VITALS — BP 144/105 | HR 91 | Temp 98.1°F | Wt 196.5 lb

## 2014-03-16 DIAGNOSIS — I10 Essential (primary) hypertension: Secondary | ICD-10-CM

## 2014-03-16 DIAGNOSIS — Z227 Latent tuberculosis: Secondary | ICD-10-CM

## 2014-03-16 DIAGNOSIS — B20 Human immunodeficiency virus [HIV] disease: Secondary | ICD-10-CM

## 2014-03-16 DIAGNOSIS — J852 Abscess of lung without pneumonia: Secondary | ICD-10-CM

## 2014-03-16 DIAGNOSIS — K029 Dental caries, unspecified: Secondary | ICD-10-CM

## 2014-03-16 DIAGNOSIS — R7611 Nonspecific reaction to tuberculin skin test without active tuberculosis: Secondary | ICD-10-CM

## 2014-03-16 DIAGNOSIS — Z113 Encounter for screening for infections with a predominantly sexual mode of transmission: Secondary | ICD-10-CM

## 2014-03-16 LAB — CBC WITH DIFFERENTIAL/PLATELET
BASOS ABS: 0 10*3/uL (ref 0.0–0.1)
Basophils Relative: 0 % (ref 0–1)
EOS PCT: 1 % (ref 0–5)
Eosinophils Absolute: 0.1 10*3/uL (ref 0.0–0.7)
HEMATOCRIT: 36.8 % (ref 36.0–46.0)
Hemoglobin: 12.7 g/dL (ref 12.0–15.0)
LYMPHS PCT: 29 % (ref 12–46)
Lymphs Abs: 1.9 10*3/uL (ref 0.7–4.0)
MCH: 29.2 pg (ref 26.0–34.0)
MCHC: 34.5 g/dL (ref 30.0–36.0)
MCV: 84.6 fL (ref 78.0–100.0)
MONO ABS: 0.8 10*3/uL (ref 0.1–1.0)
Monocytes Relative: 12 % (ref 3–12)
Neutro Abs: 3.8 10*3/uL (ref 1.7–7.7)
Neutrophils Relative %: 58 % (ref 43–77)
Platelets: 218 10*3/uL (ref 150–400)
RBC: 4.35 MIL/uL (ref 3.87–5.11)
RDW: 14.7 % (ref 11.5–15.5)
WBC: 6.5 10*3/uL (ref 4.0–10.5)

## 2014-03-16 LAB — COMPLETE METABOLIC PANEL WITH GFR
ALBUMIN: 3.7 g/dL (ref 3.5–5.2)
ALT: 16 U/L (ref 0–35)
AST: 19 U/L (ref 0–37)
Alkaline Phosphatase: 56 U/L (ref 39–117)
BUN: 14 mg/dL (ref 6–23)
CHLORIDE: 105 meq/L (ref 96–112)
CO2: 23 mEq/L (ref 19–32)
Calcium: 8.8 mg/dL (ref 8.4–10.5)
Creat: 0.86 mg/dL (ref 0.50–1.10)
GFR, Est African American: >89 mL/min
GFR, Est Non African American: 83 mL/min
Glucose, Bld: 62 mg/dL — ABNORMAL LOW (ref 70–99)
POTASSIUM: 4.1 meq/L (ref 3.5–5.3)
SODIUM: 136 meq/L (ref 135–145)
Total Bilirubin: 0.5 mg/dL (ref 0.2–1.2)
Total Protein: 8.1 g/dL (ref 6.0–8.3)

## 2014-03-16 LAB — HEMOGLOBIN A1C
Hgb A1c MFr Bld: 5.7 % — ABNORMAL HIGH (ref <5.7)
MEAN PLASMA GLUCOSE: 117 mg/dL — AB (ref <117)

## 2014-03-16 LAB — RPR

## 2014-03-16 NOTE — Progress Notes (Signed)
Subjective:    Patient ID: Monique [name], female    DOB: 07/28/1971, 43 y.o.   MRN: [id number]  HPI   43 year old African American lady with newly diagnosed HIV AIDS (this August) also with lung abscess . He was initially treated for both lung abscess and possibility of pulmonary tuberculosis. See prior notes for workup ultimately all data was negative for pulmonary tuberculosis and she did finish a course of therapy for latent tuberculosis.  She also had protracted Augmentin she still is taking to this day. Repeat chest x-ray in October showed resolution of her pneumonia but with residual cavity left over from his infection.   I will [facility] and she has made with an undetectable viral load since healthy CD4 count  Lab Results  Component Value Date   HIV1RNAQUANT <20 09/18/2013   Lab Results  Component Value Date   CD4TABS 290* 09/18/2013   CD4TABS 240* 08/03/2013   CD4TABS 430 06/05/2013     She was seen by dental clinic for extraction of 2 teeth. She needs to be seen by them yet again. His pain one of her upper molars on the left side and wants to be referred back to the.   Her blood pressure had been  better controlled on amlodipine.  She has nearly gained all the weight that she lost when she had the lung abscess.  She has not been sexual active recently.      Review of Systems  Constitutional: Negative for chills, diaphoresis, activity change, appetite change, fatigue and unexpected weight change.  HENT: Positive for dental problem. Negative for facial swelling, rhinorrhea, sinus pressure, sneezing and trouble swallowing.   Eyes: Negative for photophobia and visual disturbance.  Respiratory: Negative for chest tightness, shortness of breath and stridor.   Cardiovascular: Negative for palpitations and leg swelling.  Gastrointestinal: Negative for constipation, blood in stool, abdominal distention and anal bleeding.   Genitourinary: Negative for dysuria, hematuria, flank pain and difficulty urinating.  Musculoskeletal: Negative for arthralgias, back pain, gait problem, joint swelling and myalgias.  Skin: Negative for color change, pallor and wound.  Neurological: Negative for dizziness, tremors, weakness and light-headedness.  Hematological: Negative for adenopathy. Does not bruise/bleed easily.  Psychiatric/Behavioral: Negative for behavioral problems, confusion, sleep disturbance, dysphoric mood, decreased concentration and agitation.       Objective:   Physical Exam  Constitutional: She is oriented to person, place, and time. She appears well-developed and well-nourished. No distress.  HENT:  Head: Normocephalic and atraumatic.  Mouth/Throat: Oropharynx is clear and moist. No oropharyngeal exudate.  Eyes: Conjunctivae and EOM are normal. No scleral icterus.  Neck: Normal range of motion. Neck supple. No JVD present.  Cardiovascular: Normal rate, regular rhythm and normal heart sounds.  Exam reveals no gallop and no friction rub.   No murmur heard. Pulmonary/Chest: Effort normal. No respiratory distress. She has no wheezes.  Abdominal: She exhibits no distension and no mass. There is no tenderness. There is no rebound and no guarding.  Musculoskeletal: She exhibits no edema and no tenderness.  Lymphadenopathy:    She has no cervical adenopathy.  Neurological: She is alert and oriented to person, place, and time. She has normal reflexes. She exhibits normal muscle tone. Coordination normal.  Skin: Skin is warm and dry. She is not diaphoretic. No erythema. No pallor.  Psychiatric: She has a normal mood and affect. Her behavior is normal. Judgment and thought content normal.  Assessment & Plan:  HIV/AIDS: -Continue TRIUMEQ --check labs today.  Bring back in several weeks to review and ensure that she has renewed  ADAP program through March   I spent greater than 25  minutes with  the patient including greater than 50% of time in face to face counsel of the patient and in coordination of their care.   Lung abscess likely due to bacterial pathogen --Resolved  Dental problems: Refer back to our ambulatory dentist  again.  Hypertension refilled her amlodipine , check a microalbumin to creatinine ratio may indeed a second agent  Weight gain partly due to the fact that she is now with prophylaxis suppressed viral load and regain her health. Advised her to cut carbohydrates from her diet as much as possible we'll check a hemoglobin A1c.  LTB vs Pulmonary TB: with all cultures negative FINAL, pcr negative, this is NOT  pulmonary TB. She has finished TB therapy

## 2014-03-17 LAB — MICROALBUMIN / CREATININE URINE RATIO
CREATININE, URINE: 101 mg/dL
Microalb Creat Ratio: 5.3 mg/g (ref 0.0–30.0)
Microalb, Ur: 0.54 mg/dL (ref 0.00–1.89)

## 2014-03-17 LAB — T-HELPER CELL (CD4) - (RCID CLINIC ONLY)
CD4 T CELL ABS: 300 /uL — AB (ref 400–2700)
CD4 T CELL HELPER: 16 % — AB (ref 33–55)

## 2014-03-20 LAB — HIV-1 RNA QUANT-NO REFLEX-BLD
HIV 1 RNA Quant: 28124 {copies}/mL — ABNORMAL HIGH (ref <20)
HIV-1 RNA Quant, Log: 4.45 {Log} — ABNORMAL HIGH (ref <1.30)

## 2014-03-24 ENCOUNTER — Other Ambulatory Visit: Payer: Self-pay | Admitting: Infectious Disease

## 2014-03-24 ENCOUNTER — Telehealth: Payer: Self-pay | Admitting: *Deleted

## 2014-03-24 DIAGNOSIS — B20 Human immunodeficiency virus [HIV] disease: Secondary | ICD-10-CM

## 2014-03-24 NOTE — Telephone Encounter (Signed)
Thanks Jackie 

## 2014-03-24 NOTE — Telephone Encounter (Signed)
Per patient she had been off her meds for 2 weeks. She has restarted them and has an appointment with Dr. Daiva EvesVan Dam on 04/19/14. HIV genotype added and lab is checking if there is enough sample to add an integrase. Monique [name]

## 2014-03-24 NOTE — Addendum Note (Signed)
Addended by: Mariea ClontsGREEN, Ainslee Sou D on: 03/24/2014 04:09 PM   Modules accepted: Orders

## 2014-03-24 NOTE — Telephone Encounter (Signed)
Message copied by Macy MisOCKERHAM, JACQUELINE A on Wed Mar 24, 2014 12:30 PM ------      Message from: VAN DAM, CORNELIUS N      Created: Tue Mar 23, 2014 11:15 PM       Something is CLEARLY WRONG with Monique [name], can we find out how long she has really been on her meds or how long she has been off because she has a VL of 22k. We should add an HIV genotype and HIV integrase genotype to her labs, have her make sure she is taking her meds and come to clinic in the next 3 weeks with her meds ------

## 2014-04-02 LAB — HIV-1 INTEGRASE GENOTYPE

## 2014-04-06 LAB — HIV-1 GENOTYPR PLUS

## 2014-04-19 ENCOUNTER — Ambulatory Visit: Payer: Self-pay

## 2014-04-19 ENCOUNTER — Ambulatory Visit (INDEPENDENT_AMBULATORY_CARE_PROVIDER_SITE_OTHER): Payer: Self-pay | Admitting: Infectious Disease

## 2014-04-19 ENCOUNTER — Other Ambulatory Visit: Payer: Self-pay

## 2014-04-19 ENCOUNTER — Encounter: Payer: Self-pay | Admitting: Infectious Disease

## 2014-04-19 VITALS — BP 134/95 | HR 77 | Temp 98.0°F | Wt 203.8 lb

## 2014-04-19 DIAGNOSIS — B20 Human immunodeficiency virus [HIV] disease: Secondary | ICD-10-CM

## 2014-04-19 DIAGNOSIS — K0889 Other specified disorders of teeth and supporting structures: Secondary | ICD-10-CM

## 2014-04-19 DIAGNOSIS — J852 Abscess of lung without pneumonia: Secondary | ICD-10-CM

## 2014-04-19 DIAGNOSIS — Z23 Encounter for immunization: Secondary | ICD-10-CM

## 2014-04-19 DIAGNOSIS — K089 Disorder of teeth and supporting structures, unspecified: Secondary | ICD-10-CM

## 2014-04-19 LAB — COMPLETE METABOLIC PANEL WITH GFR
ALBUMIN: 4.3 g/dL (ref 3.5–5.2)
ALT: 17 U/L (ref 0–35)
AST: 15 U/L (ref 0–37)
Alkaline Phosphatase: 55 U/L (ref 39–117)
BUN: 18 mg/dL (ref 6–23)
CALCIUM: 9.5 mg/dL (ref 8.4–10.5)
CHLORIDE: 103 meq/L (ref 96–112)
CO2: 22 mEq/L (ref 19–32)
Creat: 0.94 mg/dL (ref 0.50–1.10)
GFR, EST AFRICAN AMERICAN: 86 mL/min
GFR, EST NON AFRICAN AMERICAN: 75 mL/min
GLUCOSE: 76 mg/dL (ref 70–99)
POTASSIUM: 4 meq/L (ref 3.5–5.3)
Sodium: 135 mEq/L (ref 135–145)
Total Bilirubin: 0.3 mg/dL (ref 0.2–1.2)
Total Protein: 8.6 g/dL — ABNORMAL HIGH (ref 6.0–8.3)

## 2014-04-19 NOTE — Progress Notes (Signed)
Subjective:    Patient ID: Monique [name], female    DOB: Dec 16, 1970, 43 y.o.   MRN: [id number]  HPI   43 year old African American lady with newly diagnosed HIV AIDS (this August) also with lung abscess . She was initially treated for both lung abscess and possibility of pulmonary tuberculosis. See prior notes for workup ultimately all data was negative for pulmonary tuberculosis and she did finish a course of therapy for latent tuberculosis.  She also had protracted Augmentin she still is taking to this day. Repeat chest x-ray in October showed resolution of her pneumonia but with residual cavity left over from his infection.   I will simplified her antiretroviral medications to [facility] and she had been withan undetectable viral load since healthy CD4 count. However at her last visit her viral load was suddenly 28,124 copies. Her genotype showed no resistance and no increased resistance. I question her today about what had happened and she believes maybe she has missed a few weeks in the spring sometime. She assures me she has been taking her TRIUMEQ since I last saw her on July 21 and she brought one empty bottle and one bottle that was at least three fourths full of medication to the visit with her. She does need to renew ADAP and will try to make sure this happens today. He had a bit of a cough with no fevers not dry nonproductive. Also having dental pain and is in need of followup with dental. I told her that she very well might need a undetectable viral load for all dental procedures this micrograms with an ultra dental clinic here.  Lab Results  Component Value Date   HIV1RNAQUANT 28124* 03/16/2014   Lab Results  Component Value Date   CD4TABS 300* 03/16/2014   CD4TABS 290* 09/18/2013   CD4TABS 240* 08/03/2013       Review of Systems  Constitutional: Negative for chills, diaphoresis, activity change, appetite change, fatigue and unexpected weight change.  HENT: Positive for  dental problem. Negative for facial swelling, rhinorrhea, sinus pressure, sneezing and trouble swallowing.   Eyes: Negative for photophobia and visual disturbance.  Respiratory: Negative for chest tightness, shortness of breath and stridor.   Cardiovascular: Negative for palpitations and leg swelling.  Gastrointestinal: Negative for constipation, blood in stool, abdominal distention and anal bleeding.  Genitourinary: Negative for dysuria, hematuria, flank pain and difficulty urinating.  Musculoskeletal: Negative for arthralgias, back pain, gait problem, joint swelling and myalgias.  Skin: Negative for color change, pallor and wound.  Neurological: Negative for dizziness, tremors, weakness and light-headedness.  Hematological: Negative for adenopathy. Does not bruise/bleed easily.  Psychiatric/Behavioral: Negative for behavioral problems, confusion, sleep disturbance, dysphoric mood, decreased concentration and agitation.       Objective:   Physical Exam  Constitutional: She is oriented to person, place, and time. She appears well-developed and well-nourished. No distress.  HENT:  Head: Normocephalic and atraumatic.  Mouth/Throat: Oropharynx is clear and moist. No oropharyngeal exudate.  Eyes: Conjunctivae and EOM are normal. No scleral icterus.  Neck: Normal range of motion. Neck supple. No JVD present.  Cardiovascular: Normal rate, regular rhythm and normal heart sounds.  Exam reveals no gallop and no friction rub.   No murmur heard. Pulmonary/Chest: Effort normal. No respiratory distress. She has no wheezes.  Abdominal: She exhibits no distension and no mass. There is no tenderness. There is no rebound and no guarding.  Musculoskeletal: She exhibits no edema and no tenderness.  Lymphadenopathy:    She  has no cervical adenopathy.  Neurological: She is alert and oriented to person, place, and time. She has normal reflexes. She exhibits normal muscle tone. Coordination normal.  Skin:  Skin is warm and dry. She is not diaphoretic. No erythema. No pallor.  Psychiatric: She has a normal mood and affect. Her behavior is normal. Judgment and thought content normal.          Assessment & Plan:  HIV/AIDS: -Continue TRIUMEQ --check labs today.  Bring back in several weeks to review  RENEW  ADAP program through March   I spent greater than 25  minutes with the patient including greater than 50% of time in face to face counsel of the patient and in coordination of their care.   Lung abscess likely due to bacterial pathogen --Resolved  Dental problems: Refer back to our ambulatory dentist  again.  Hypertension refilled her amlodipine ,

## 2014-04-19 NOTE — Patient Instructions (Signed)
RENEW ADAP WITH MICHELLE

## 2014-04-20 LAB — T-HELPER CELL (CD4) - (RCID CLINIC ONLY)
CD4 T CELL ABS: 300 /uL — AB (ref 400–2700)
CD4 T CELL HELPER: 20 % — AB (ref 33–55)

## 2014-04-20 LAB — HIV-1 RNA QUANT-NO REFLEX-BLD: HIV 1 RNA Quant: <20 copies/mL (ref <20)

## 2014-04-22 ENCOUNTER — Telehealth: Payer: Self-pay | Admitting: *Deleted

## 2014-04-22 NOTE — Telephone Encounter (Signed)
Shared results.  Pt very happy about the Viral Load results.  Encouraged pt to continue taking medications daily.  Pt verbalized understanding.

## 2014-04-22 NOTE — Telephone Encounter (Signed)
She is doing great again!!

## 2014-04-23 ENCOUNTER — Ambulatory Visit (INDEPENDENT_AMBULATORY_CARE_PROVIDER_SITE_OTHER): Payer: Self-pay | Admitting: *Deleted

## 2014-04-23 ENCOUNTER — Ambulatory Visit: Payer: Self-pay

## 2014-04-23 DIAGNOSIS — Z124 Encounter for screening for malignant neoplasm of cervix: Secondary | ICD-10-CM

## 2014-04-23 DIAGNOSIS — Z1231 Encounter for screening mammogram for malignant neoplasm of breast: Secondary | ICD-10-CM

## 2014-04-23 DIAGNOSIS — R87612 Low grade squamous intraepithelial lesion on cytologic smear of cervix (LGSIL): Secondary | ICD-10-CM

## 2014-04-23 NOTE — Patient Instructions (Signed)
Your results will be ready in about a week.  I will mail them to you.  Thank you for coming to the Center for your care.  Denise,  RN 

## 2014-04-23 NOTE — Progress Notes (Addendum)
  Subjective:     Monique [name] is a 43 y.o. woman who comes in today for a  pap smear only. Previous abnormal Pap smears: no. Contraception: Condoms.  Left leg/foot painful and edematous.  Pt stated that she will go to the [facility] next week.  Objective:    LMP 03/29/2014 Pelvic Exam:  Pap smear obtained.   Assessment:    Screening pap smear.   Plan:    Follow up in one year, or as indicated by Pap results. Completed [employer]hip application.  Pt given educational materials re: HIV and women, self-esteem, BSE, nutrition and diet management, PAP smears and partner safety. Pt given condoms.  Abnormal PAP smear results.  Referral to [facility].

## 2014-04-26 LAB — CYTOLOGY - PAP

## 2014-04-27 ENCOUNTER — Telehealth: Payer: Self-pay | Admitting: *Deleted

## 2014-04-27 ENCOUNTER — Encounter: Payer: Self-pay | Admitting: *Deleted

## 2014-04-27 NOTE — Telephone Encounter (Signed)
Thanks Denise! 

## 2014-04-27 NOTE — Addendum Note (Signed)
Addended by: Jennet Maduro D on: 04/27/2014 03:46 PM   Modules accepted: Orders

## 2014-04-27 NOTE — Telephone Encounter (Signed)
Pt referred to [facility] for ABNL PAP smear requiring f/u.  Unable to contact the pt.  Will mail her a letter.

## 2014-04-28 ENCOUNTER — Emergency Department (HOSPITAL_COMMUNITY)
Admission: EM | Admit: 2014-04-28 | Discharge: 2014-04-28 | Disposition: A | Discharge status: Home / Self Care | Payer: Self-pay | Attending: Emergency Medicine | Admitting: Emergency Medicine

## 2014-04-28 ENCOUNTER — Encounter (HOSPITAL_COMMUNITY): Payer: Self-pay | Admitting: Emergency Medicine

## 2014-04-28 DIAGNOSIS — I10 Essential (primary) hypertension: Secondary | ICD-10-CM | POA: Insufficient documentation

## 2014-04-28 DIAGNOSIS — R109 Unspecified abdominal pain: Secondary | ICD-10-CM | POA: Insufficient documentation

## 2014-04-28 DIAGNOSIS — Z8611 Personal history of tuberculosis: Secondary | ICD-10-CM | POA: Insufficient documentation

## 2014-04-28 DIAGNOSIS — Z79899 Other long term (current) drug therapy: Secondary | ICD-10-CM | POA: Insufficient documentation

## 2014-04-28 DIAGNOSIS — M79609 Pain in unspecified limb: Secondary | ICD-10-CM | POA: Insufficient documentation

## 2014-04-28 DIAGNOSIS — M7989 Other specified soft tissue disorders: Secondary | ICD-10-CM

## 2014-04-28 DIAGNOSIS — Z8701 Personal history of pneumonia (recurrent): Secondary | ICD-10-CM | POA: Insufficient documentation

## 2014-04-28 DIAGNOSIS — R609 Edema, unspecified: Secondary | ICD-10-CM | POA: Insufficient documentation

## 2014-04-28 DIAGNOSIS — Z8709 Personal history of other diseases of the respiratory system: Secondary | ICD-10-CM | POA: Insufficient documentation

## 2014-04-28 DIAGNOSIS — Z8619 Personal history of other infectious and parasitic diseases: Secondary | ICD-10-CM | POA: Insufficient documentation

## 2014-04-28 LAB — I-STAT CHEM 8, ED
BUN: 20 mg/dL (ref 6–23)
Calcium, Ion: 1.22 mmol/L (ref 1.12–1.23)
Chloride: 108 mEq/L (ref 96–112)
Creatinine, Ser: 1 mg/dL (ref 0.50–1.10)
Glucose, Bld: 93 mg/dL (ref 70–99)
HCT: 37 % (ref 36.0–46.0)
HEMOGLOBIN: 12.6 g/dL (ref 12.0–15.0)
Potassium: 3.9 mEq/L (ref 3.7–5.3)
SODIUM: 140 meq/L (ref 137–147)
TCO2: 22 mmol/L (ref 0–100)

## 2014-04-28 LAB — HIV-1 INTEGRASE GENOTYPE

## 2014-04-28 NOTE — Progress Notes (Signed)
VASCULAR LAB PRELIMINARY  PRELIMINARY  PRELIMINARY  PRELIMINARY  Bilateral lower extremity venous duplex completed.    Preliminary report:  Bilateral:  No evidence of DVT, superficial thrombosis, or Baker's Cyst.   Stefon Ramthun, RVS  04/28/2014, 4:59 PM

## 2014-04-28 NOTE — ED Notes (Signed)
Declined W/C at D/C and was escorted to lobby by RN. 

## 2014-04-28 NOTE — ED Provider Notes (Signed)
CSN: [id number]     Arrival date & time 04/28/14  1427 History  This chart was scribed for non-physician practitioner, Lottie Mussel, PA-C, working with Toy Cookey, MD by Charline Bills, ED Scribe. This patient was seen in room TR08C/TR08C and the patient's care was started at 3:36 PM.   Chief Complaint  Patient presents with  . Foot Pain   The history is provided by the patient. No language interpreter was used.   HPI Comments: Monique [name] is a 43 y.o. female, with a h/o HTN, who presents to the Emergency Department complaining of gradually worsening L foot pain onset 1 week ago, worsened over the past 3 days. Pt reports associated bilateral leg swelling over the past week, L calf pain and L flank pain. She denies SOB. She also denies h/o kidney complications or similar swelling.   Past Medical History  Diagnosis Date  . Tonsillitis   . Hypertension   . TB (tuberculosis)   . Pneumonia   . Lung abscess    Past Surgical History  Procedure Laterality Date  . Cesarean section    . Video bronchoscopy Bilateral 04/13/2013    Procedure: VIDEO BRONCHOSCOPY WITHOUT FLUORO;  Surgeon: Kalman Shan, MD;  Location: [facility] ENDOSCOPY;  Service: Cardiopulmonary;  Laterality: Bilateral;   Family History  Problem Relation Age of Onset  . Diabetes Sister   . Hypertension Sister    History  Substance Use Topics  . Smoking status: Never Smoker   . Smokeless tobacco: Not on file  . Alcohol Use: No   OB History   Grav Para Term Preterm Abortions TAB SAB Ect Mult Living                 Review of Systems  Respiratory: Negative for shortness of breath.   Cardiovascular: Positive for leg swelling.  Genitourinary: Positive for flank pain.  Musculoskeletal: Positive for myalgias.  All other systems reviewed and are negative.  Allergies  Review of patient's allergies indicates no known allergies.  Home Medications   Prior to Admission medications   Medication Sig Start Date  End Date Taking? Authorizing Provider  Abacavir-Dolutegravir-Lamivud (TRIUMEQ) 600-50-300 MG TABS Take 1 tablet by mouth daily. 02/05/14   Randall Hiss, MD  amLODipine (NORVASC) 10 MG tablet Take 1 tablet (10 mg total) by mouth daily. 08/03/13   Randall Hiss, MD   Triage Vitals: BP 132/85  Pulse 98  Temp(Src) 98.1 F (36.7 C) (Oral)  Resp 18  SpO2 100%  LMP 04/15/2014 Physical Exam  Nursing note and vitals reviewed. Constitutional: She is oriented to person, place, and time. She appears well-developed and well-nourished.  HENT:  Head: Normocephalic and atraumatic.  Eyes: Conjunctivae and EOM are normal.  Neck: Neck supple.  Cardiovascular: Normal rate.   Pulmonary/Chest: Effort normal.  Musculoskeletal: Normal range of motion.  2+ bilateral LE edema of feet, ankles. Pain with palpation over left calf muscle. Positive homans sign. Dorsal pedal pulses intact bilaterally  Neurological: She is alert and oriented to person, place, and time.  Skin: Skin is warm and dry.  Psychiatric: She has a normal mood and affect. Her behavior is normal.   ED Course  Procedures (including critical care time) DIAGNOSTIC STUDIES: Oxygen Saturation is 100% on RA, normal by my interpretation.    COORDINATION OF CARE: 3:41 PM-Discussed treatment plan which includes diagnostic lab work with pt at bedside and pt agreed to plan.   Labs Review Labs Reviewed  I-STAT CHEM 8,  ED   Imaging Review No results found.   EKG Interpretation None      MDM   Final diagnoses:  None    Patient has bilateral, left more than right, leg swelling. No respiratory symptoms. No orthopnea. Pain in the left calf. Will get venous Doppler for evaluation. Will check i-STAT chem 8 for renal function.  Chem 8 is normal. Patient signed out to PA upstill pending venous Dopplers. If negative patient is to followup with primary care Dr.  Ceasar Mons Vitals:   04/28/14 1447 04/28/14 1728  BP: 132/85 135/88   Pulse: 98 90  Temp: 98.1 F (36.7 C) 98.4 F (36.9 C)  TempSrc: Oral Oral  Resp: 18 12  SpO2: 100% 99%     I personally performed the services described in this documentation, which was scribed in my presence. The recorded information has been reviewed and is accurate.    Lottie Mussel, PA-C 04/29/14 1525

## 2014-04-28 NOTE — Discharge Instructions (Signed)
Peripheral Edema °You have swelling in your legs (peripheral edema). This swelling is due to excess accumulation of salt and water in your body. Edema may be a sign of heart, kidney or liver disease, or a side effect of a medication. It may also be due to problems in the leg veins. Elevating your legs and using special support stockings may be very helpful, if the cause of the swelling is due to poor venous circulation. Avoid long periods of standing, whatever the cause. °Treatment of edema depends on identifying the cause. Chips, pretzels, pickles and other salty foods should be avoided. Restricting salt in your diet is almost always needed. Water pills (diuretics) are often used to remove the excess salt and water from your body via urine. These medicines prevent the kidney from reabsorbing sodium. This increases urine flow. °Diuretic treatment may also result in lowering of potassium levels in your body. Potassium supplements may be needed if you have to use diuretics daily. Daily weights can help you keep track of your progress in clearing your edema. You should call your caregiver for follow up care as recommended. °SEEK IMMEDIATE MEDICAL CARE IF:  °· You have increased swelling, pain, redness, or heat in your legs. °· You develop shortness of breath, especially when lying down. °· You develop chest or abdominal pain, weakness, or fainting. °· You have a fever. °Document Released: 09/20/2004 Document Revised: 11/05/2011 Document Reviewed: 08/31/2009 °ExitCare® Patient Information ©2015 [employer]. This information is not intended to replace advice given to you by your health care provider. Make sure you discuss any questions you have with your health care provider. ° °

## 2014-04-28 NOTE — ED Provider Notes (Signed)
Patient care transferred from [facility] [place], [place] with doppler pending to rule out DVT.  Preliminary results show no DVT or other abnormality. Plan of care included elevation to reduce swelling. Follow up with PCP for recheck.  Arnoldo Hooker, PA-C 04/28/14 1712

## 2014-04-28 NOTE — ED Notes (Signed)
Swelling and pain in L foot.  Patient states makes her whole leg hurt.

## 2014-04-28 NOTE — ED Notes (Signed)
Pt reports left foot pain x 1 week that is progressively worsening. Denies injury. Worse with ambulation. Pulses/sensation intact. NAD. Ambulatory to triage.

## 2014-04-29 ENCOUNTER — Other Ambulatory Visit: Payer: Self-pay | Admitting: Licensed Clinical Social Worker

## 2014-04-29 ENCOUNTER — Telehealth: Payer: Self-pay | Admitting: Licensed Clinical Social Worker

## 2014-04-29 DIAGNOSIS — R600 Localized edema: Secondary | ICD-10-CM

## 2014-04-29 MED ORDER — FUROSEMIDE 20 MG PO TABS: 20.0000 mg | ORAL_TABLET | Freq: Every day | ORAL | Status: DC

## 2014-04-29 NOTE — Telephone Encounter (Signed)
Patient went to the ED and was told that she had edema in her legs but did not get a rx , she was told to f/u with you because she does not have a pcp or insurance. Please advise. I forwarded the ED note to Dr. Daiva Eves for review.

## 2014-04-29 NOTE — Telephone Encounter (Signed)
Is there spot with ID clinic MD?

## 2014-04-29 NOTE — Telephone Encounter (Signed)
Not until next Tuesday with Dr. Luciana Axe

## 2014-04-29 NOTE — Telephone Encounter (Signed)
I think that is OK unless this is SEVERE swelling.  We could also start her lasix  daily

## 2014-04-30 NOTE — ED Provider Notes (Signed)
Medical screening examination/treatment/procedure(s) were performed by non-physician practitioner and as supervising physician I was immediately available for consultation/collaboration.   EKG Interpretation None        Toy Cookey, MD 04/30/14 2123

## 2014-04-30 NOTE — Progress Notes (Signed)
Referred to WOC.  Monique [name]

## 2014-04-30 NOTE — ED Provider Notes (Signed)
Medical screening examination/treatment/procedure(s) were performed by non-physician practitioner and as supervising physician I was immediately available for consultation/collaboration.   EKG Interpretation None         Toy Cookey, MD 04/30/14 2129

## 2014-05-05 ENCOUNTER — Ambulatory Visit: Payer: Self-pay | Admitting: Infectious Disease

## 2014-05-12 ENCOUNTER — Other Ambulatory Visit: Payer: Self-pay | Admitting: *Deleted

## 2014-05-12 MED ORDER — ABACAVIR-DOLUTEGRAVIR-LAMIVUD 600-50-300 MG PO TABS: 1.0000 | ORAL_TABLET | Freq: Every day | ORAL | Status: DC

## 2014-05-14 ENCOUNTER — Encounter: Payer: Self-pay | Admitting: Medical

## 2014-05-20 ENCOUNTER — Telehealth: Payer: Self-pay | Admitting: *Deleted

## 2014-05-20 NOTE — Telephone Encounter (Signed)
Pt needs to obtain "[employer]" to be seen for [facility] referral for ABNL PAP smear.  Left message with detailed information on how to obtain an appt to apply for an "[employer]."

## 2014-05-26 ENCOUNTER — Ambulatory Visit (HOSPITAL_COMMUNITY): Payer: Self-pay

## 2014-05-28 ENCOUNTER — Encounter (HOSPITAL_COMMUNITY): Payer: Self-pay

## 2014-05-28 ENCOUNTER — Telehealth: Payer: Self-pay | Admitting: *Deleted

## 2014-05-28 NOTE — Telephone Encounter (Signed)
Patient reapplied for ADAP 05/12/14, has not yet been approved, is unable to pick up her medication.  Patient will have to check next week to see if it has been approved.  Patient was advised to renew at the beginning of the ADAP renewal period next time to avoid a gap in coverage.

## 2014-05-31 ENCOUNTER — Ambulatory Visit (HOSPITAL_COMMUNITY): Admission: RE | Admit: 2014-05-31 | Payer: Self-pay | Source: Ambulatory Visit

## 2014-06-17 ENCOUNTER — Ambulatory Visit: Payer: Self-pay | Admitting: Infectious Disease

## 2014-07-19 ENCOUNTER — Ambulatory Visit (INDEPENDENT_AMBULATORY_CARE_PROVIDER_SITE_OTHER): Payer: Self-pay | Admitting: Infectious Disease

## 2014-07-19 ENCOUNTER — Encounter: Payer: Self-pay | Admitting: Infectious Disease

## 2014-07-19 VITALS — BP 157/99 | HR 87 | Temp 98.8°F | Wt 220.0 lb

## 2014-07-19 DIAGNOSIS — R635 Abnormal weight gain: Secondary | ICD-10-CM

## 2014-07-19 DIAGNOSIS — Z8719 Personal history of other diseases of the digestive system: Secondary | ICD-10-CM

## 2014-07-19 DIAGNOSIS — B20 Human immunodeficiency virus [HIV] disease: Secondary | ICD-10-CM

## 2014-07-19 DIAGNOSIS — Z87898 Personal history of other specified conditions: Secondary | ICD-10-CM

## 2014-07-19 DIAGNOSIS — I10 Essential (primary) hypertension: Secondary | ICD-10-CM

## 2014-07-19 DIAGNOSIS — R600 Localized edema: Secondary | ICD-10-CM

## 2014-07-19 MED ORDER — ABACAVIR-DOLUTEGRAVIR-LAMIVUD 600-50-300 MG PO TABS: 1.0000 | ORAL_TABLET | Freq: Every day | ORAL | Status: DC

## 2014-07-19 MED ORDER — FUROSEMIDE 20 MG PO TABS: 20.0000 mg | ORAL_TABLET | Freq: Every day | ORAL | Status: DC

## 2014-07-19 MED ORDER — AMLODIPINE BESYLATE 10 MG PO TABS: 10.0000 mg | ORAL_TABLET | Freq: Every day | ORAL | Status: DC

## 2014-07-19 NOTE — Progress Notes (Signed)
Subjective:    Patient ID: Monique [name], female    DOB: 05/10/1971, 43 y.o.   MRN: [id number]  HPI   43 year old African American lady with newly diagnosed HIV AIDS ( August 2014) also with lung abscess . She was initially treated for both lung abscess and possibility of pulmonary tuberculosis. See prior notes for workup ultimately all data was negative for pulmonary tuberculosis and she did finish a course of therapy for latent tuberculosis.  She also had protracted Augmentin she still is taking to this day. Repeat chest x-ray in October showed resolution of her pneumonia but with residual cavity left over from his infection.   [facility]  [facility] and she had been withan undetectable viral load since healthy CD4 count. However at hervisit  In July her viral load was suddenly 28,124 copies. Her genotype showed no resistance and no increased resistance. I question her today about what had happened and she believes maybe she has missed a few weeks in the spring sometime. She assures me she has been taking her TRIUMEQ since I last saw her on July 21 and she brought one empty bottle and one bottle that was at least three fourths full of medication to the visit with her. We got her renewed on ADAP and she is again nicely suppressed.  Lab Results  Component Value Date   HIV1RNAQUANT <20 04/19/2014   Lab Results  Component Value Date   CD4TABS 300* 04/19/2014   CD4TABS 300* 03/16/2014   CD4TABS 290* 09/18/2013    She is concerned for weight gain but this is likely due to virus coming under control again and potentially some HIV lipodystrophy.   Review of Systems  Constitutional: Negative for chills, diaphoresis, activity change, appetite change, fatigue and unexpected weight change.  HENT: Negative for facial swelling, rhinorrhea, sinus pressure, sneezing and trouble swallowing.   Eyes: Negative for photophobia and visual disturbance.  Respiratory:  Negative for chest tightness, shortness of breath and stridor.   Cardiovascular: Negative for palpitations and leg swelling.  Gastrointestinal: Negative for constipation, blood in stool, abdominal distention and anal bleeding.  Genitourinary: Negative for dysuria, hematuria, flank pain and difficulty urinating.  Musculoskeletal: Negative for myalgias, back pain, joint swelling, arthralgias and gait problem.  Skin: Negative for color change, pallor and wound.  Neurological: Negative for dizziness, tremors, weakness and light-headedness.  Hematological: Negative for adenopathy. Does not bruise/bleed easily.  Psychiatric/Behavioral: Negative for behavioral problems, confusion, sleep disturbance, dysphoric mood, decreased concentration and agitation.       Objective:   Physical Exam  Constitutional: She is oriented to person, place, and time. She appears well-developed and well-nourished. No distress.  HENT:  Head: Normocephalic and atraumatic.  Mouth/Throat: Oropharynx is clear and moist.  Eyes: Conjunctivae and EOM are normal. Pupils are equal, round, and reactive to light. No scleral icterus.  Neck: Normal range of motion. Neck supple.  Cardiovascular: Normal rate and regular rhythm.   Pulmonary/Chest: Effort normal. No respiratory distress. She has no wheezes.  Abdominal: Soft. She exhibits no distension.  Musculoskeletal: She exhibits no edema or tenderness.  Neurological: She is alert and oriented to person, place, and time. She exhibits normal muscle tone. Coordination normal.  Skin: Skin is warm and dry. She is not diaphoretic. No erythema. No pallor.  Psychiatric: She has a normal mood and affect. Her behavior is normal. Judgment and thought content normal.          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 Recommended low carb diet, exercise  I spent greater than 25  minutes with the patient including greater than 50% of time in face to face counsel of the patient and in coordination of their care.   Dental problems: make sure has been seen by ambulatory dentist  again.  Hypertension refilled her amlodipine ,

## 2014-07-19 NOTE — Patient Instructions (Signed)
Thanks for doing such a great job with your medicines  We will refer you to a Nutritionist  I would also recommend trying diets low in carbohydrates such as "Atkins" or "Paleolithic" diets and simply cutting out simple sugars, sodas with sugar, potatoes

## 2014-08-04 ENCOUNTER — Telehealth: Payer: Self-pay | Admitting: *Deleted

## 2014-08-04 NOTE — Telephone Encounter (Signed)
I would say it should be a 30 minute appt at minimum. We should also come up with a  Policy in clinic for charging for letters to support disability it seems I have been getting requests from Lawyers for such lawyers regularly

## 2014-08-04 NOTE — Telephone Encounter (Signed)
Patient is requesting an appointment with Dr. Daiva EvesVan Dam to discuss him witting her a letter of support for her disability application with [employer].  Patietn states she is applying for disability due to her HIV and "other issues."  RN asked if patient has a PCP to handle this, she replied that Dr. Daiva EvesVan Dam is her only physician. Please advise if this is appropriate.  If so, how long of an appointment should be scheduled? Andree CossHowell, Wing Gfeller M, RN

## 2014-08-05 NOTE — Telephone Encounter (Signed)
12/22, 3:00 for 30 minutes to discuss this letter.

## 2014-08-17 ENCOUNTER — Ambulatory Visit (INDEPENDENT_AMBULATORY_CARE_PROVIDER_SITE_OTHER): Payer: Self-pay | Admitting: Infectious Disease

## 2014-08-17 ENCOUNTER — Encounter: Payer: Self-pay | Admitting: Infectious Disease

## 2014-08-17 VITALS — BP 134/88 | HR 87 | Temp 98.1°F | Wt 216.0 lb

## 2014-08-17 DIAGNOSIS — M67442 Ganglion, left hand: Secondary | ICD-10-CM | POA: Insufficient documentation

## 2014-08-17 DIAGNOSIS — J851 Abscess of lung with pneumonia: Secondary | ICD-10-CM

## 2014-08-17 DIAGNOSIS — Z113 Encounter for screening for infections with a predominantly sexual mode of transmission: Secondary | ICD-10-CM

## 2014-08-17 DIAGNOSIS — J852 Abscess of lung without pneumonia: Secondary | ICD-10-CM

## 2014-08-17 DIAGNOSIS — S80811A Abrasion, right lower leg, initial encounter: Secondary | ICD-10-CM

## 2014-08-17 DIAGNOSIS — L089 Local infection of the skin and subcutaneous tissue, unspecified: Secondary | ICD-10-CM | POA: Insufficient documentation

## 2014-08-17 DIAGNOSIS — B20 Human immunodeficiency virus [HIV] disease: Secondary | ICD-10-CM

## 2014-08-17 DIAGNOSIS — S80819A Abrasion, unspecified lower leg, initial encounter: Secondary | ICD-10-CM

## 2014-08-17 LAB — COMPLETE METABOLIC PANEL WITH GFR
ALBUMIN: 4.1 g/dL (ref 3.5–5.2)
ALT: 26 U/L (ref 0–35)
AST: 26 U/L (ref 0–37)
Alkaline Phosphatase: 59 U/L (ref 39–117)
BUN: 14 mg/dL (ref 6–23)
CALCIUM: 9.6 mg/dL (ref 8.4–10.5)
CO2: 23 mEq/L (ref 19–32)
Chloride: 104 mEq/L (ref 96–112)
Creat: 0.8 mg/dL (ref 0.50–1.10)
GFR, Est African American: >89 mL/min
Glucose, Bld: 87 mg/dL (ref 70–99)
Potassium: 4.2 mEq/L (ref 3.5–5.3)
Sodium: 134 mEq/L — ABNORMAL LOW (ref 135–145)
Total Bilirubin: 0.3 mg/dL (ref 0.2–1.2)
Total Protein: 8.3 g/dL (ref 6.0–8.3)

## 2014-08-17 LAB — CBC WITH DIFFERENTIAL/PLATELET
BASOS PCT: 0 % (ref 0–1)
Basophils Absolute: 0 10*3/uL (ref 0.0–0.1)
Eosinophils Absolute: 0.1 10*3/uL (ref 0.0–0.7)
Eosinophils Relative: 1 % (ref 0–5)
HCT: 37.3 % (ref 36.0–46.0)
HEMOGLOBIN: 12.8 g/dL (ref 12.0–15.0)
LYMPHS ABS: 2 10*3/uL (ref 0.7–4.0)
LYMPHS PCT: 24 % (ref 12–46)
MCH: 29 pg (ref 26.0–34.0)
MCHC: 34.3 g/dL (ref 30.0–36.0)
MCV: 84.6 fL (ref 78.0–100.0)
MONO ABS: 0.8 10*3/uL (ref 0.1–1.0)
MONOS PCT: 9 % (ref 3–12)
MPV: 9.6 fL (ref 9.4–12.4)
NEUTROS ABS: 5.5 10*3/uL (ref 1.7–7.7)
NEUTROS PCT: 66 % (ref 43–77)
PLATELETS: 280 10*3/uL (ref 150–400)
RBC: 4.41 MIL/uL (ref 3.87–5.11)
RDW: 15.9 % — ABNORMAL HIGH (ref 11.5–15.5)
WBC: 8.4 10*3/uL (ref 4.0–10.5)

## 2014-08-17 MED ORDER — SULFAMETHOXAZOLE-TRIMETHOPRIM 800-160 MG PO TABS: 1.0000 | ORAL_TABLET | Freq: Two times a day (BID) | ORAL | Status: DC

## 2014-08-17 NOTE — Progress Notes (Signed)
Subjective:    Patient ID: Monique [name], female    DOB: 1971/08/16, 43 y.o.   MRN: [id number]  HPI   43 year old African American lady with HIV AIDS ( August 2014) also with lung abscess . She was initially treated for both lung abscess and possibility of pulmonary tuberculosis. See prior notes for workup ultimately all data was negative for pulmonary tuberculosis and she did finish a course of therapy for latent tuberculosis.  She also had protracted Augmentin .   I  simpl[facility] her [facility] medications [facility] and she had been withan undetectable viral load since healthy CD4 count. However at hervisit  In July 2015 her viral load was suddenly 28,124 copies. Her genotype showed no resistance and no increased resistance.We got her renewed on ADAP and she was again nicely suppressed this fall.  Lab Results  Component Value Date   HIV1RNAQUANT <20 04/19/2014   Lab Results  Component Value Date   CD4TABS 300* 04/19/2014   CD4TABS 300* 03/16/2014   CD4TABS 290* 09/18/2013    She comes in today after having called and asked for me to write letter supporting disability but after I then asked her to make an appointment so that she could explain to me in what way she was currently disabled. TOday she tells me she is no longer pursuing disability. She was denied Medicaid but I encouraged her to re-apply.  She states that she thinks she is having headaches related to the [facility].  She has several other complaints.   She has swelling in her left wrist in appearance c/w ganglion cyst x 3 weeks.  She has injured her right leg which got caught in a door, and similarly her right finger.   The area on her calf has not healed or responded to topical abx.   Review of Systems  Constitutional: Negative for chills, diaphoresis, activity change, appetite change, fatigue and unexpected weight change.  HENT: Negative for facial swelling, rhinorrhea, sinus pressure, sneezing and trouble  swallowing.   Eyes: Negative for photophobia and visual disturbance.  Respiratory: Negative for chest tightness, shortness of breath and stridor.   Cardiovascular: Negative for palpitations and leg swelling.  Gastrointestinal: Negative for constipation, blood in stool, abdominal distention and anal bleeding.  Genitourinary: Negative for dysuria, hematuria, flank pain and difficulty urinating.  Musculoskeletal: Negative for myalgias, back pain, joint swelling, arthralgias and gait problem.  Skin: Positive for color change and wound. Negative for pallor.  Neurological: Positive for headaches. Negative for dizziness, tremors, weakness and light-headedness.  Hematological: Negative for adenopathy. Does not bruise/bleed easily.  Psychiatric/Behavioral: Negative for behavioral problems, confusion, sleep disturbance, dysphoric mood, decreased concentration and agitation.       Objective:   Physical Exam  Constitutional: She is oriented to person, place, and time. She appears well-developed and well-nourished. No distress.  HENT:  Head: Normocephalic and atraumatic.  Mouth/Throat: Oropharynx is clear and moist.  Eyes: Conjunctivae and EOM are normal. Pupils are equal, round, and reactive to light. No scleral icterus.  Neck: Normal range of motion. Neck supple.  Cardiovascular: Normal rate and regular rhythm.   Pulmonary/Chest: Effort normal. No respiratory distress. She has no wheezes.  Abdominal: Soft. She exhibits no distension.  Musculoskeletal: She exhibits no edema or tenderness.  Neurological: She is alert and oriented to person, place, and time. She exhibits normal muscle tone. Coordination normal.  Skin: Skin is warm. She is not diaphoretic.  Psychiatric: She has a normal mood and affect. Her speech is slurred.  Right lower leg with wound with surrounding erythema but not draining material       Left wrist with ganglion cyst:           Assessment & Plan:   HIV/AIDS: -Continue TRIUMEQ and check labs today, ensure ADAP renewal in January  I spent greater than 25  minutes with the patient including greater than 50% of time in face to face counsel of the patient and in coordination of their care.  Lung abscess: resolved  Right leg wound: looks infected and I will start Bactrim DS two bid x 10 days, if worsens may need surgical debridement   Ganglion cyst: elevation, observation consider aspirating and or referring to surgeon  Hypertension: continue amlodipine

## 2014-08-17 NOTE — Patient Instructions (Signed)
Should be seen week of the 6th of January if not with me with one of my partners

## 2014-08-18 LAB — T-HELPER CELL (CD4) - (RCID CLINIC ONLY)
CD4 T CELL ABS: 470 /uL (ref 400–2700)
CD4 T CELL HELPER: 22 % — AB (ref 33–55)

## 2014-08-18 LAB — RPR

## 2014-08-19 LAB — URINE CYTOLOGY ANCILLARY ONLY
CHLAMYDIA, DNA PROBE: NEGATIVE
NEISSERIA GONORRHEA: NEGATIVE

## 2014-08-21 LAB — HIV-1 RNA ULTRAQUANT REFLEX TO GENTYP+

## 2014-08-29 LAB — HIV-1 INTEGRASE GENOTYPE

## 2014-09-06 ENCOUNTER — Other Ambulatory Visit: Payer: Self-pay

## 2014-09-06 ENCOUNTER — Telehealth: Payer: Self-pay | Admitting: *Deleted

## 2014-09-06 NOTE — Telephone Encounter (Signed)
Patient called stating she missed this morning's lab appointment due to worsening swelling in her right leg. She went to an Urgent Care and was told to fill her Rx for Lasix but said they did not look at her wound. She states she also is feeling "feverish". Per Dr. Zenaida NieceVan Dam's last office note he said if wound did not improve with Bactrim she would possibly need surgical debridement. She is uninsured and because of the severity of her wound; advised patient to be seen at the ED to be evaluated. She agreed to this.

## 2014-09-08 ENCOUNTER — Other Ambulatory Visit: Payer: Self-pay

## 2014-09-20 ENCOUNTER — Ambulatory Visit: Payer: Self-pay | Admitting: Infectious Disease

## 2014-09-21 ENCOUNTER — Ambulatory Visit (INDEPENDENT_AMBULATORY_CARE_PROVIDER_SITE_OTHER): Payer: Self-pay | Admitting: Infectious Disease

## 2014-09-21 ENCOUNTER — Emergency Department (HOSPITAL_COMMUNITY): Payer: Self-pay

## 2014-09-21 ENCOUNTER — Encounter (HOSPITAL_COMMUNITY): Payer: Self-pay | Admitting: Emergency Medicine

## 2014-09-21 ENCOUNTER — Telehealth: Payer: Self-pay | Admitting: Infectious Disease

## 2014-09-21 ENCOUNTER — Encounter: Payer: Self-pay | Admitting: Infectious Disease

## 2014-09-21 ENCOUNTER — Emergency Department (HOSPITAL_COMMUNITY)
Admission: EM | Admit: 2014-09-21 | Discharge: 2014-09-22 | Disposition: A | Discharge status: Home / Self Care | Payer: Self-pay | Attending: Emergency Medicine | Admitting: Emergency Medicine

## 2014-09-21 VITALS — BP 136/92 | HR 99 | Temp 97.7°F | Ht 62.0 in | Wt 218.0 lb

## 2014-09-21 DIAGNOSIS — M67442 Ganglion, left hand: Secondary | ICD-10-CM

## 2014-09-21 DIAGNOSIS — Z21 Asymptomatic human immunodeficiency virus [HIV] infection status: Secondary | ICD-10-CM | POA: Insufficient documentation

## 2014-09-21 DIAGNOSIS — R0609 Other forms of dyspnea: Secondary | ICD-10-CM | POA: Insufficient documentation

## 2014-09-21 DIAGNOSIS — S80811D Abrasion, right lower leg, subsequent encounter: Secondary | ICD-10-CM

## 2014-09-21 DIAGNOSIS — L089 Local infection of the skin and subcutaneous tissue, unspecified: Secondary | ICD-10-CM

## 2014-09-21 DIAGNOSIS — J852 Abscess of lung without pneumonia: Secondary | ICD-10-CM

## 2014-09-21 DIAGNOSIS — R069 Unspecified abnormalities of breathing: Secondary | ICD-10-CM

## 2014-09-21 DIAGNOSIS — Z79899 Other long term (current) drug therapy: Secondary | ICD-10-CM | POA: Insufficient documentation

## 2014-09-21 DIAGNOSIS — B20 Human immunodeficiency virus [HIV] disease: Secondary | ICD-10-CM | POA: Insufficient documentation

## 2014-09-21 DIAGNOSIS — Z8611 Personal history of tuberculosis: Secondary | ICD-10-CM | POA: Insufficient documentation

## 2014-09-21 DIAGNOSIS — Z8709 Personal history of other diseases of the respiratory system: Secondary | ICD-10-CM | POA: Insufficient documentation

## 2014-09-21 DIAGNOSIS — M7989 Other specified soft tissue disorders: Secondary | ICD-10-CM | POA: Insufficient documentation

## 2014-09-21 DIAGNOSIS — Z8701 Personal history of pneumonia (recurrent): Secondary | ICD-10-CM | POA: Insufficient documentation

## 2014-09-21 DIAGNOSIS — I10 Essential (primary) hypertension: Secondary | ICD-10-CM | POA: Insufficient documentation

## 2014-09-21 DIAGNOSIS — J851 Abscess of lung with pneumonia: Secondary | ICD-10-CM

## 2014-09-21 HISTORY — DX: Human immunodeficiency virus (HIV) disease: B20

## 2014-09-21 HISTORY — DX: Asymptomatic human immunodeficiency virus (hiv) infection status: Z21

## 2014-09-21 LAB — COMPREHENSIVE METABOLIC PANEL
ALT: 16 U/L (ref 0–35)
ANION GAP: 9 (ref 5–15)
AST: 19 U/L (ref 0–37)
Albumin: 3.9 g/dL (ref 3.5–5.2)
Alkaline Phosphatase: 62 U/L (ref 39–117)
BUN: 17 mg/dL (ref 6–23)
CO2: 21 mmol/L (ref 19–32)
Calcium: 9.4 mg/dL (ref 8.4–10.5)
Chloride: 107 mmol/L (ref 96–112)
Creatinine, Ser: 0.94 mg/dL (ref 0.50–1.10)
GFR calc Af Amer: 84 mL/min — ABNORMAL LOW (ref >90)
GFR calc non Af Amer: 73 mL/min — ABNORMAL LOW (ref >90)
Glucose, Bld: 103 mg/dL — ABNORMAL HIGH (ref 70–99)
Potassium: 3.9 mmol/L (ref 3.5–5.1)
SODIUM: 137 mmol/L (ref 135–145)
Total Bilirubin: 0.5 mg/dL (ref 0.3–1.2)
Total Protein: 7.7 g/dL (ref 6.0–8.3)

## 2014-09-21 LAB — CBC WITH DIFFERENTIAL/PLATELET
BASOS ABS: 0 10*3/uL (ref 0.0–0.1)
Basophils Absolute: 0 10*3/uL (ref 0.0–0.1)
Basophils Relative: 0 % (ref 0–1)
Basophils Relative: 0 % (ref 0–1)
Eosinophils Absolute: 0.1 10*3/uL (ref 0.0–0.7)
Eosinophils Absolute: 0.1 10*3/uL (ref 0.0–0.7)
Eosinophils Relative: 2 % (ref 0–5)
Eosinophils Relative: 2 % (ref 0–5)
HCT: 35.1 % — ABNORMAL LOW (ref 36.0–46.0)
HCT: 35.2 % — ABNORMAL LOW (ref 36.0–46.0)
Hemoglobin: 12.1 g/dL (ref 12.0–15.0)
Hemoglobin: 12.1 g/dL (ref 12.0–15.0)
Lymphocytes Relative: 27 % (ref 12–46)
Lymphocytes Relative: 24 % (ref 12–46)
Lymphs Abs: 2.1 10*3/uL (ref 0.7–4.0)
Lymphs Abs: 1.8 10*3/uL (ref 0.7–4.0)
MCH: 28.9 pg (ref 26.0–34.0)
MCH: 28.7 pg (ref 26.0–34.0)
MCHC: 34.5 g/dL (ref 30.0–36.0)
MCHC: 34.4 g/dL (ref 30.0–36.0)
MCV: 84 fL (ref 78.0–100.0)
MCV: 83.6 fL (ref 78.0–100.0)
MONOS PCT: 11 % (ref 3–12)
MONOS PCT: 11 % (ref 3–12)
Monocytes Absolute: 0.9 10*3/uL (ref 0.1–1.0)
Monocytes Absolute: 0.8 10*3/uL (ref 0.1–1.0)
NEUTROS ABS: 4.9 10*3/uL (ref 1.7–7.7)
NEUTROS PCT: 60 % (ref 43–77)
Neutro Abs: 4.7 10*3/uL (ref 1.7–7.7)
Neutrophils Relative %: 63 % (ref 43–77)
PLATELETS: 287 10*3/uL (ref 150–400)
PLATELETS: 248 10*3/uL (ref 150–400)
RBC: 4.18 MIL/uL (ref 3.87–5.11)
RBC: 4.21 MIL/uL (ref 3.87–5.11)
RDW: 14.9 % (ref 11.5–15.5)
RDW: 14.9 % (ref 11.5–15.5)
WBC: 7.7 10*3/uL (ref 4.0–10.5)
WBC: 7.6 10*3/uL (ref 4.0–10.5)

## 2014-09-21 LAB — I-STAT CHEM 8, ED
BUN: 18 mg/dL (ref 6–23)
CALCIUM ION: 1.22 mmol/L (ref 1.12–1.23)
CREATININE: 0.9 mg/dL (ref 0.50–1.10)
Chloride: 108 mmol/L (ref 96–112)
Glucose, Bld: 103 mg/dL — ABNORMAL HIGH (ref 70–99)
HCT: 42 % (ref 36.0–46.0)
HEMOGLOBIN: 14.3 g/dL (ref 12.0–15.0)
Potassium: 3.6 mmol/L (ref 3.5–5.1)
Sodium: 140 mmol/L (ref 135–145)
TCO2: 19 mmol/L (ref 0–100)

## 2014-09-21 LAB — I-STAT TROPONIN, ED: TROPONIN I, POC: 0 ng/mL (ref 0.00–0.08)

## 2014-09-21 LAB — D-DIMER, QUANTITATIVE (NOT AT ARMC): D DIMER QUANT: 0.96 ug{FEU}/mL — AB (ref 0.00–0.48)

## 2014-09-21 LAB — BRAIN NATRIURETIC PEPTIDE: B Natriuretic Peptide: 16.8 pg/mL (ref 0.0–100.0)

## 2014-09-21 MED ORDER — IOHEXOL 350 MG/ML SOLN
100.0000 mL | Freq: Once | INTRAVENOUS | Status: AC | PRN
  Administered 2014-09-21: 100 mL via INTRAVENOUS

## 2014-09-21 NOTE — ED Provider Notes (Signed)
CSN: [id number]     Arrival date & time 09/21/14  2048 History   First MD Initiated Contact with Patient 09/21/14 2147     Chief Complaint  Patient presents with  . Shortness of Breath    The patient said she has been having SOB for about a week and she went to her PCP today.     (Consider location/radiation/quality/duration/timing/severity/associated sxs/prior Treatment) HPI Comments: Patient is a 44 year old female with a history of HIV, hypertension, lung abscess, and tx for latent TB (TB testing negative, however, per ID notes). She presents to the emergency department today for 1 week of persistent dyspnea on exertion. Patient states that she feels short of breath when she begins to exert herself. She states that she needs to stop and rest for a while which usually improves her symptoms. She states that she also feels fatigued with exertion. She denies any alleviating factors of her symptoms. Patient saw her infectious disease doctor who performed a d-dimer which was found to be 0.96. Advised patient to come to the emergency department for further evaluation of her symptoms to rule out a pulmonary embolism. Patient denies associated fever, wheezing, chest pain, cough, nasal congestion, rhinorrhea, and syncope. She has had no associated lightheadedness or dizziness. She does report some mild ankle swelling bilaterally which is intermittent. She does state that she snores at night time, but denies ever being diagnosed with sleep apnea. Her last CD4 count was 470. She has been compliant with all of her medications.  ID - Dr. Daiva Eves  Patient is a 44 y.o. female presenting with shortness of breath. The history is provided by the patient. No language interpreter was used.  Shortness of Breath Associated symptoms: no chest pain, no cough, no fever and no vomiting     Past Medical History  Diagnosis Date  . Tonsillitis   . Hypertension   . TB (tuberculosis)   . Pneumonia   . Lung abscess   .  HIV (human immunodeficiency virus infection)    Past Surgical History  Procedure Laterality Date  . Cesarean section    . Video bronchoscopy Bilateral 04/13/2013    Procedure: VIDEO BRONCHOSCOPY WITHOUT FLUORO;  Surgeon: Kalman Shan, MD;  Location: [facility] ENDOSCOPY;  Service: Cardiopulmonary;  Laterality: Bilateral;   Family History  Problem Relation Age of Onset  . Diabetes Sister   . Hypertension Sister    History  Substance Use Topics  . Smoking status: Never Smoker   . Smokeless tobacco: Not on file  . Alcohol Use: No   OB History    No data available      Review of Systems  Constitutional: Negative for fever.  HENT: Negative for congestion and rhinorrhea.   Respiratory: Positive for shortness of breath. Negative for cough.   Cardiovascular: Positive for leg swelling (b/l ankles). Negative for chest pain.  Gastrointestinal: Negative for nausea and vomiting.  Neurological: Negative for dizziness, syncope and light-headedness.  All other systems reviewed and are negative.   Allergies  Review of patient's allergies indicates no known allergies.  Home Medications   Prior to Admission medications   Medication Sig Start Date End Date Taking? Authorizing Provider  Abacavir-Dolutegravir-Lamivud 600-50-300 MG TABS Take 1 tablet by mouth daily. 07/19/14  Yes Randall Hiss, MD  amLODipine (NORVASC) 10 MG tablet Take 1 tablet (10 mg total) by mouth daily. 07/19/14  Yes Randall Hiss, MD  furosemide (LASIX) 20 MG tablet Take 1 tablet (20 mg total) by  mouth daily. 07/19/14  Yes Randall Hissornelius N Van Dam, MD  sulfamethoxazole-trimethoprim (SEPTRA DS) 800-160 MG per tablet Take 1 tablet by mouth 2 (two) times daily. Patient not taking: Reported on 09/21/2014 08/17/14   Randall Hissornelius N Van Dam, MD   BP 147/96 mmHg  Pulse 85  Temp(Src) 98 F (36.7 C) (Oral)  Resp 18  Ht 5\' 3"  (1.6 m)  Wt 218 lb (98.884 kg)  BMI 38.63 kg/m2  SpO2 99%  LMP 08/24/2014 (Exact Date)   Physical  Exam  Constitutional: She is oriented to person, place, and time. She appears well-developed and well-nourished. No distress.  Nontoxic/nonseptic appearing  HENT:  Head: Normocephalic and atraumatic.  Eyes: Conjunctivae and EOM are normal. No scleral icterus.  Neck: Normal range of motion.  No JVD  Cardiovascular: Normal rate, regular rhythm and intact distal pulses.   Pulmonary/Chest: Effort normal and breath sounds normal. No respiratory distress. She has no wheezes. She has no rales.  Respirations even and unlabored. No wheezing, rales, or rhonchi appreciated. No accessory muscle use.  Abdominal: Soft. There is no tenderness. There is no rebound and no guarding.  Soft obese abdomen without areas of focal tenderness. No peritoneal signs or guarding.  Musculoskeletal: Normal range of motion.  No peripheral pitting edema  Neurological: She is alert and oriented to person, place, and time. She exhibits normal muscle tone. Coordination normal.  GCS 15. Speech is goal oriented. Patient moves extremities without ataxia.  Skin: Skin is warm and dry. No rash noted. She is not diaphoretic. No erythema. No pallor.  Psychiatric: She has a normal mood and affect. Her behavior is normal.  Nursing note and vitals reviewed.   ED Course  Procedures (including critical care time) Labs Review Labs Reviewed  CBC WITH DIFFERENTIAL/PLATELET - Abnormal; Notable for the following:    HCT 35.2 (*)    All other components within normal limits  I-STAT CHEM 8, ED - Abnormal; Notable for the following:    Glucose, Bld 103 (*)    All other components within normal limits  BRAIN NATRIURETIC PEPTIDE  I-STAT TROPOININ, ED  Rosezena SensorI-STAT TROPOININ, ED    Imaging Review Dg Chest 2 View  09/21/2014   CLINICAL DATA:  Dyspnea for about 1 week.  History of pneumonia.  EXAM: CHEST  2 VIEW  COMPARISON:  08/03/2013.  FINDINGS: The left upper lobe pneumatocele has nearly completely resolved, now replaced by curvilinear  scarring and mild focal pleural thickening. The lungs are otherwise clear. Hilar, mediastinal and cardiac contours appear unremarkable except for borderline cardiomegaly, unchanged. There are no effusions.  IMPRESSION: No acute cardiopulmonary findings.   Electronically Signed   By: Ellery Plunkaniel R Mitchell M.D.   On: 09/21/2014 22:06   Ct Angio Chest Pe W/cm &/or Wo Cm  09/22/2014   CLINICAL DATA:  Shortness of breath with exertion.  History of HIV.  EXAM: CT ANGIOGRAPHY CHEST WITH CONTRAST  TECHNIQUE: Multidetector CT imaging of the chest was performed using the standard protocol during bolus administration of intravenous contrast. Multiplanar CT image reconstructions and MIPs were obtained to evaluate the vascular anatomy.  CONTRAST:  100mL OMNIPAQUE IOHEXOL 350 MG/ML SOLN  COMPARISON:  04/09/2013  FINDINGS: Technically adequate study with good opacification of the central and segmental pulmonary arteries. No focal filling defects demonstrated. No evidence of significant pulmonary embolus.  Normal heart size. Normal caliber thoracic aorta. Great vessel origins are patent. Esophagus is decompressed. Small esophageal hiatal hernia. No significant lymphadenopathy in the chest.  Evaluation of lungs is  limited due to respiratory motion artifact. There is a pneumatoceles with surrounding consolidation demonstrated in the left mid lung. This is improving since previous study and probably represents residual scarring and cavity related to previous pneumonia. Small amount of associated pleural thickening. Recurrent infection is not excluded.  Included portions of the upper abdominal organs are grossly unremarkable. No destructive bone lesions appreciated.  Review of the MIP images confirms the above findings.  IMPRESSION: No evidence of significant pulmonary embolus. New matter seal in the left upper lung with scarring and pleural thickening likely representing chronic changes related to healing of previous pneumonia. No  definite acute infiltration identified. Small esophageal hiatal hernia.   Electronically Signed   By: Burman Nieves M.D.   On: 09/22/2014 00:55     EKG Interpretation None      MDM   Final diagnoses:  Dyspnea on exertion    44 year old pleasant and nontoxic appearing female presents to the emergency department for further evaluation of dyspnea on exertion which has been worsening over the past week. Patient had a positive d-dimer completed by her infectious disease doctor which was elevated to 0.96. Patient was told to come to the ED for further evaluation of pulmonary embolism. Patient in no signs of respiratory distress on arrival. She ambulates in the ED without hypoxia. Lungs are clear to auscultation bilaterally. Workup today reveals no evidence of ACS or congestive heart failure. Chest x-ray is also unremarkable. CT PE study completed to evaluate for pulmonary embolus. This was negative, showing likely chronic changes from her known history of lung abscess. No evidence of focal consolidation or pneumonia.  Given patient's reassuring workup, suspect that her symptoms may be secondary to physical deconditioning. Have recommended that the patient follow-up with her primary care doctor for further evaluation of her shortness of breath as needed. Albuterol inhaler provided prior to discharge for PRN use. Return precautions discussed and provided. Patient agreeable to plan with no unaddressed concerns. Patient discharged in good condition.   Filed Vitals:   09/22/14 0007 09/22/14 0051 09/22/14 0100 09/22/14 0115  BP: 109/69 145/86 147/96 135/91  Pulse: 83 81 85 93  Temp: 98 F (36.7 C)     TempSrc: Oral     Resp: Height:      Weight:      SpO2: 100% 100% 99% 100%     Antony Madura, PA-C 09/23/14 0602  Geoffery Lyons, MD 09/23/14 [phone]

## 2014-09-21 NOTE — ED Notes (Addendum)
The patient said she has been having SOB for about a week and she went to her PCP today.  The patient said they ran some test and they came back positive but she cannot say what they were positive for.  The only symptom she is having is SOB she denies any other symptoms.   The patient's MD, Dr. Algis LimingVanDam is concerned for PE due to her SOB due to her exertion. She was seen in his office today for the SOB.

## 2014-09-21 NOTE — Telephone Encounter (Signed)
Unfortunately Monique [name]'s D dimer came back positive. I called her and asked her to go to ed for CT Angiogram to rule out PE

## 2014-09-21 NOTE — Progress Notes (Signed)
Subjective:    Patient ID: Monique [name], female    DOB: June 06, 1971, 44 y.o.   MRN: [id number]  HPI   44 year old African American lady with HIV AIDS ( August 2014) also with lung abscess . She was initially treated for both lung abscess and possibility of pulmonary tuberculosis. See prior notes for workup ultimately all data was negative for pulmonary tuberculosis and she did finish a course of therapy for latent tuberculosis.  She also had protracted Augmentin .   I  simplified her antiretroviral medications to [place] and she had been withan undetectable viral load since healthy CD4 count. However at hervisit  In July 2015 her viral load was suddenly 28,124 copies. Her genotype showed no resistance and no increased resistance.We got her renewed on ADAP and she was again nicely suppressed this fall.  Lab Results  Component Value Date   HIV1RNAQUANT <20 08/17/2014   Lab Results  Component Value Date   CD4TABS 470 08/17/2014   CD4TABS 300* 04/19/2014   CD4TABS 300* 03/16/2014    She came into  Clinic with several complaints in December   She has swelling in her left wrist in appearance c/w ganglion cyst x 3 weeks.  She has injured her right leg which got caught in a door, and similarly her right finger.   The area on her calf has not healed or responded to topical abx and we started oral antibiotics at last visit and this has now improved.  She now has had new onset dyspnea on exertion x several days. No prolonged immobilization. She is tachycardic. She does not have assymetric edema to suggest DVT but I have some concern for PE (though she is low probability)   Review of Systems  Constitutional: Negative for chills, diaphoresis, activity change, appetite change, fatigue and unexpected weight change.  HENT: Negative for facial swelling, rhinorrhea, sinus pressure, sneezing and trouble swallowing.   Eyes: Negative for photophobia and visual disturbance.  Respiratory:  Positive for shortness of breath. Negative for chest tightness and stridor.   Cardiovascular: Negative for palpitations and leg swelling.  Gastrointestinal: Negative for constipation, blood in stool, abdominal distention and anal bleeding.  Genitourinary: Negative for dysuria, hematuria, flank pain and difficulty urinating.  Musculoskeletal: Negative for myalgias, back pain, joint swelling, arthralgias and gait problem.  Skin: Positive for color change and wound. Negative for pallor.  Neurological: Positive for headaches. Negative for dizziness, tremors, weakness and light-headedness.  Hematological: Negative for adenopathy. Does not bruise/bleed easily.  Psychiatric/Behavioral: Negative for behavioral problems, confusion, sleep disturbance, dysphoric mood, decreased concentration and agitation.       Objective:   Physical Exam  Constitutional: She is oriented to person, place, and time. She appears well-developed and well-nourished. No distress.  HENT:  Head: Normocephalic and atraumatic.  Mouth/Throat: Oropharynx is clear and moist.  Eyes: Conjunctivae and EOM are normal. Pupils are equal, round, and reactive to light. No scleral icterus.  Neck: Normal range of motion. Neck supple.  Cardiovascular: Normal rate, regular rhythm and normal heart sounds.  Exam reveals no gallop and no friction rub.   No murmur heard. Pulmonary/Chest: Effort normal and breath sounds normal. No respiratory distress. She has no wheezes. She has no rales. She exhibits no tenderness.  Abdominal: Soft. She exhibits no distension.  Musculoskeletal: She exhibits no edema or tenderness.  Neurological: She is alert and oriented to person, place, and time. She exhibits normal muscle tone. Coordination normal.  Skin: Skin is warm. She is not diaphoretic.  Psychiatric: She has a normal mood and affect. Her speech is slurred.   Right lower leg with wound with surrounding erythema but not draining material on  08/17/2014     Picture today after antibiotics 09/21/14:      Left wrist with ganglion cyst December 2015:     Ganglion cyst today 09/21/14:           Assessment & Plan:   NEW onset Dyspnea on exertion: Low prob for PE but D Dimer has been found to be positive, therefore needs CTA and will have pt come back to ED for this tonight.  I spent greater than 40 minutes with the patient including greater than 50% of time in face to face counsel of the patient and in coordination of their care.   HIV/AIDS: -Continue TRIUMEQ ensure ADAP renewal. . Lung abscess: resolved  Right leg wound: looks improved  Ganglion cyst: elevation, observation will refer to Sports Medicine with orange card consider aspirating and or referring to surgeon  Hypertension: continue amlodipine

## 2014-09-21 NOTE — ED Notes (Signed)
Pt being taken to x-ray. This RN noticed the pt has audible wheezing, but does not appear to be in distress.

## 2014-09-22 ENCOUNTER — Other Ambulatory Visit: Payer: Self-pay | Admitting: *Deleted

## 2014-09-22 DIAGNOSIS — B20 Human immunodeficiency virus [HIV] disease: Secondary | ICD-10-CM

## 2014-09-22 MED ORDER — ABACAVIR-DOLUTEGRAVIR-LAMIVUD 600-50-300 MG PO TABS: 1.0000 | ORAL_TABLET | Freq: Every day | ORAL | Status: DC

## 2014-09-22 MED ORDER — ALBUTEROL SULFATE HFA 108 (90 BASE) MCG/ACT IN AERS
2.0000 | INHALATION_SPRAY | Freq: Once | RESPIRATORY_TRACT | Status: AC
  Administered 2014-09-22: 2 via RESPIRATORY_TRACT
  Filled 2014-09-22: qty 6.7

## 2014-09-22 NOTE — Discharge Instructions (Signed)

## 2014-09-22 NOTE — ED Notes (Signed)
Pt left before this RN could give the pt her inhaler and discharge instructions.

## 2014-09-22 NOTE — ED Notes (Signed)
Gave pt Sprite, per RN 

## 2014-09-22 NOTE — ED Notes (Signed)
Pt came back to room to get inhaler. This RN gave the pt her inhaler and her discharge instructions.

## 2014-09-22 NOTE — Telephone Encounter (Signed)
[facility]

## 2014-09-22 NOTE — ED Notes (Signed)
Ambulated pt and SPO2 results were between 98 and 99.

## 2014-10-18 ENCOUNTER — Ambulatory Visit: Payer: Self-pay

## 2014-10-21 ENCOUNTER — Ambulatory Visit: Payer: Self-pay | Admitting: Sports Medicine

## 2014-12-21 ENCOUNTER — Other Ambulatory Visit: Payer: Self-pay

## 2014-12-28 ENCOUNTER — Other Ambulatory Visit: Payer: Self-pay

## 2014-12-30 ENCOUNTER — Other Ambulatory Visit (INDEPENDENT_AMBULATORY_CARE_PROVIDER_SITE_OTHER): Payer: Self-pay

## 2014-12-30 DIAGNOSIS — Z113 Encounter for screening for infections with a predominantly sexual mode of transmission: Secondary | ICD-10-CM

## 2014-12-30 DIAGNOSIS — B20 Human immunodeficiency virus [HIV] disease: Secondary | ICD-10-CM

## 2014-12-30 LAB — CBC WITH DIFFERENTIAL/PLATELET
Basophils Absolute: 0 10*3/uL (ref 0.0–0.1)
Basophils Relative: 0 % (ref 0–1)
EOS ABS: 0.2 10*3/uL (ref 0.0–0.7)
Eosinophils Relative: 3 % (ref 0–5)
HEMATOCRIT: 35.6 % — AB (ref 36.0–46.0)
HEMOGLOBIN: 12.2 g/dL (ref 12.0–15.0)
Lymphocytes Relative: 24 % (ref 12–46)
Lymphs Abs: 1.5 10*3/uL (ref 0.7–4.0)
MCH: 29.2 pg (ref 26.0–34.0)
MCHC: 34.3 g/dL (ref 30.0–36.0)
MCV: 85.2 fL (ref 78.0–100.0)
MONO ABS: 0.8 10*3/uL (ref 0.1–1.0)
MPV: 10 fL (ref 8.6–12.4)
Monocytes Relative: 13 % — ABNORMAL HIGH (ref 3–12)
NEUTROS ABS: 3.7 10*3/uL (ref 1.7–7.7)
Neutrophils Relative %: 60 % (ref 43–77)
PLATELETS: 281 10*3/uL (ref 150–400)
RBC: 4.18 MIL/uL (ref 3.87–5.11)
RDW: 15.4 % (ref 11.5–15.5)
WBC: 6.2 10*3/uL (ref 4.0–10.5)

## 2014-12-30 LAB — COMPREHENSIVE METABOLIC PANEL
ALBUMIN: 3.7 g/dL (ref 3.5–5.2)
ALK PHOS: 63 U/L (ref 39–117)
ALT: 24 U/L (ref 0–35)
AST: 29 U/L (ref 0–37)
BUN: 10 mg/dL (ref 6–23)
CO2: 19 mEq/L (ref 19–32)
Calcium: 8.6 mg/dL (ref 8.4–10.5)
Chloride: 102 mEq/L (ref 96–112)
Creat: 0.88 mg/dL (ref 0.50–1.10)
GLUCOSE: 74 mg/dL (ref 70–99)
POTASSIUM: 3.8 meq/L (ref 3.5–5.3)
Sodium: 134 mEq/L — ABNORMAL LOW (ref 135–145)
Total Bilirubin: 0.5 mg/dL (ref 0.2–1.2)
Total Protein: 7.6 g/dL (ref 6.0–8.3)

## 2014-12-30 LAB — RPR

## 2014-12-30 NOTE — Addendum Note (Signed)
Addended by: Mariea ClontsGREEN, Eragon Hammond D on: 12/30/2014 02:00 PM   Modules accepted: Orders

## 2014-12-31 LAB — URINE CYTOLOGY ANCILLARY ONLY
Chlamydia: NEGATIVE
NEISSERIA GONORRHEA: NEGATIVE

## 2014-12-31 LAB — HIV-1 RNA QUANT-NO REFLEX-BLD

## 2014-12-31 LAB — T-HELPER CELL (CD4) - (RCID CLINIC ONLY)
CD4 % Helper T Cell: 23 % — ABNORMAL LOW (ref 33–55)
CD4 T Cell Abs: 370 /uL — ABNORMAL LOW (ref 400–2700)

## 2015-01-05 ENCOUNTER — Ambulatory Visit: Payer: Self-pay | Admitting: Infectious Disease

## 2015-01-10 ENCOUNTER — Ambulatory Visit: Payer: Self-pay | Admitting: Infectious Disease

## 2015-01-12 ENCOUNTER — Ambulatory Visit (INDEPENDENT_AMBULATORY_CARE_PROVIDER_SITE_OTHER): Payer: Self-pay | Admitting: Infectious Disease

## 2015-01-12 ENCOUNTER — Encounter: Payer: Self-pay | Admitting: Infectious Disease

## 2015-01-12 VITALS — BP 142/89 | HR 80 | Temp 98.8°F | Wt 223.0 lb

## 2015-01-12 DIAGNOSIS — B20 Human immunodeficiency virus [HIV] disease: Secondary | ICD-10-CM

## 2015-01-12 DIAGNOSIS — R600 Localized edema: Secondary | ICD-10-CM

## 2015-01-12 DIAGNOSIS — E881 Lipodystrophy, not elsewhere classified: Secondary | ICD-10-CM

## 2015-01-12 DIAGNOSIS — E663 Overweight: Secondary | ICD-10-CM

## 2015-01-12 DIAGNOSIS — S80812D Abrasion, left lower leg, subsequent encounter: Secondary | ICD-10-CM

## 2015-01-12 DIAGNOSIS — J852 Abscess of lung without pneumonia: Secondary | ICD-10-CM

## 2015-01-12 DIAGNOSIS — Z79899 Other long term (current) drug therapy: Secondary | ICD-10-CM

## 2015-01-12 DIAGNOSIS — I10 Essential (primary) hypertension: Secondary | ICD-10-CM | POA: Insufficient documentation

## 2015-01-12 DIAGNOSIS — L089 Local infection of the skin and subcutaneous tissue, unspecified: Secondary | ICD-10-CM

## 2015-01-12 HISTORY — DX: Human immunodeficiency virus (HIV) disease: B20

## 2015-01-12 HISTORY — DX: Localized edema: R60.0

## 2015-01-12 HISTORY — DX: Other long term (current) drug therapy: Z79.899

## 2015-01-12 MED ORDER — HYDROCHLOROTHIAZIDE 25 MG PO TABS: 25.0000 mg | ORAL_TABLET | Freq: Every day | ORAL | Status: DC

## 2015-01-12 NOTE — Progress Notes (Signed)
Subjective:    Patient ID: Monique [name], female    DOB: 24-Jul-1971, 44 y.o.   MRN: [id number]  HPI   44 year old African American lady with HIV AIDS ( August 2014) also with lung abscess . She was initially treated for both lung abscess and possibility of pulmonary tuberculosis. See prior notes for workup ultimately all data was negative for pulmonary tuberculosis and she did finish a course of therapy for latent tuberculosis.  She also had protracted Augmentin .   [facility]  [facility] and she had been withan undetectable viral load since healthy CD4 count. However at hervisit  In July 2015 her viral load was suddenly 28,124 copies. Her genotype showed no resistance and no increased resistance.We got her renewed on ADAP and she was again nicely suppressed ever since then.  Lab Results  Component Value Date   HIV1RNAQUANT <20 12/30/2014   Lab Results  Component Value Date   CD4TABS 370* 12/30/2014   CD4TABS 470 08/17/2014   CD4TABS 300* 04/19/2014    We had treated her for infection of her leg this fall in December. This has resolved. We also ruled her out for pulmonary embolism had given her diuretic in the form of Lasix in addition to her amlodipine. She has run out of her Lasix most recently and is having some more lower exam edema worsening hypertension in the absence of Lasix.  She is also concerned about her problems with weight gain and we were reviewed issues surrounding HIV and weight gain in particular visceral adiposity.   Review of Systems  Constitutional: Negative for chills, diaphoresis, activity change, appetite change, fatigue and unexpected weight change.  HENT: Negative for facial swelling, rhinorrhea, sinus pressure, sneezing and trouble swallowing.   Eyes: Negative for photophobia and visual disturbance.  Respiratory: Negative for chest tightness and stridor.   Cardiovascular: Negative for palpitations and leg swelling.    Gastrointestinal: Negative for constipation, blood in stool, abdominal distention and anal bleeding.  Genitourinary: Negative for dysuria, hematuria, flank pain and difficulty urinating.  Musculoskeletal: Negative for myalgias, back pain, joint swelling, arthralgias and gait problem.  Skin: Negative for color change, pallor and wound.  Neurological: Negative for dizziness, tremors, weakness, light-headedness and headaches.  Hematological: Negative for adenopathy. Does not bruise/bleed easily.  Psychiatric/Behavioral: Negative for behavioral problems, confusion, sleep disturbance, dysphoric mood, decreased concentration and agitation.       Objective:   Physical Exam  Constitutional: She is oriented to person, place, and time. She appears well-developed and well-nourished. No distress.  HENT:  Head: Normocephalic and atraumatic.  Mouth/Throat: Oropharynx is clear and moist.  Eyes: Conjunctivae and EOM are normal. Pupils are equal, round, and reactive to light. No scleral icterus.  Neck: Normal range of motion. Neck supple.  Cardiovascular: Normal rate, regular rhythm and normal heart sounds.  Exam reveals no gallop and no friction rub.   No murmur heard. Pulmonary/Chest: Effort normal and breath sounds normal. No respiratory distress. She has no wheezes. She has no rales. She exhibits no tenderness.  Abdominal: Soft. She exhibits no distension.  Musculoskeletal: She exhibits no edema or tenderness.  Neurological: She is alert and oriented to person, place, and time. She exhibits normal muscle tone. Coordination normal.  Skin: Skin is warm. She is not diaphoretic.  Psychiatric: She has a normal mood and affect. Her speech is slurred.   Right lower leg with wound with surrounding erythema but not draining material on 08/17/2014     Picture  today after antibiotics 09/21/14:      Left wrist with ganglion cyst December 2015:     Ganglion cyst1/26/16:            Assessment & Plan:   HIV/AIDS: -Continue TRIUMEQ ensure ADAP renewal,   HIV associated Visceral adiposity and lipodystrophy: reviewed with her problems surrounding try and use human growth hormone and recommended that we go with a diet plan of cutting carbohydrates if not calories.. I spent greater than 40 minutes with the patient including greater than 50% of time in face to face counsel of the patient regarding nature of her HIV treatment for HIV impact of HIV upon her weight impact of antiretrovirals upon her weight and plans to help her lose weightand in coordination of their care.  . Lung abscess: resolved  Right leg wound:resolved  Hypertension: continue amlodipine, discontinue Lasix and add in hydrochlorothiazide have her come back in one week for blood pressure check.  Overweight: See above plan for losing weight with carbohydrate restriction.

## 2015-01-12 NOTE — Patient Instructions (Signed)
Make a RN appt for BP check in one week  For weight loss consider   Low carbohydrate diet  Ex "Atkins, Paleolithic"  Vs Calorie restricted

## 2015-01-19 ENCOUNTER — Ambulatory Visit: Payer: Self-pay

## 2015-02-02 ENCOUNTER — Ambulatory Visit: Payer: Self-pay | Admitting: Infectious Disease

## 2015-02-24 ENCOUNTER — Telehealth: Payer: Self-pay | Admitting: *Deleted

## 2015-02-24 NOTE — Telephone Encounter (Signed)
Left cheek started swelling last night, teeth involved.  Dental Clinic here today.  RN spoke with Dr. Martie RoundMilner.  He is going to contact the pt.  Dr. Martie RoundMilner mentioned that he would probably start the pt on antibiotics.

## 2015-02-26 IMAGING — CR DG CHEST 1V SAME DAY
1 series · 1 of 1 positions shown · non-contrast
Comparison: CT chest from today.  Chest x-ray from today.

CLINICAL DATA: Post left lung biopsy.  Rule out pneumothorax.

CHEST - 1 VIEW SAME DAY

[AP]
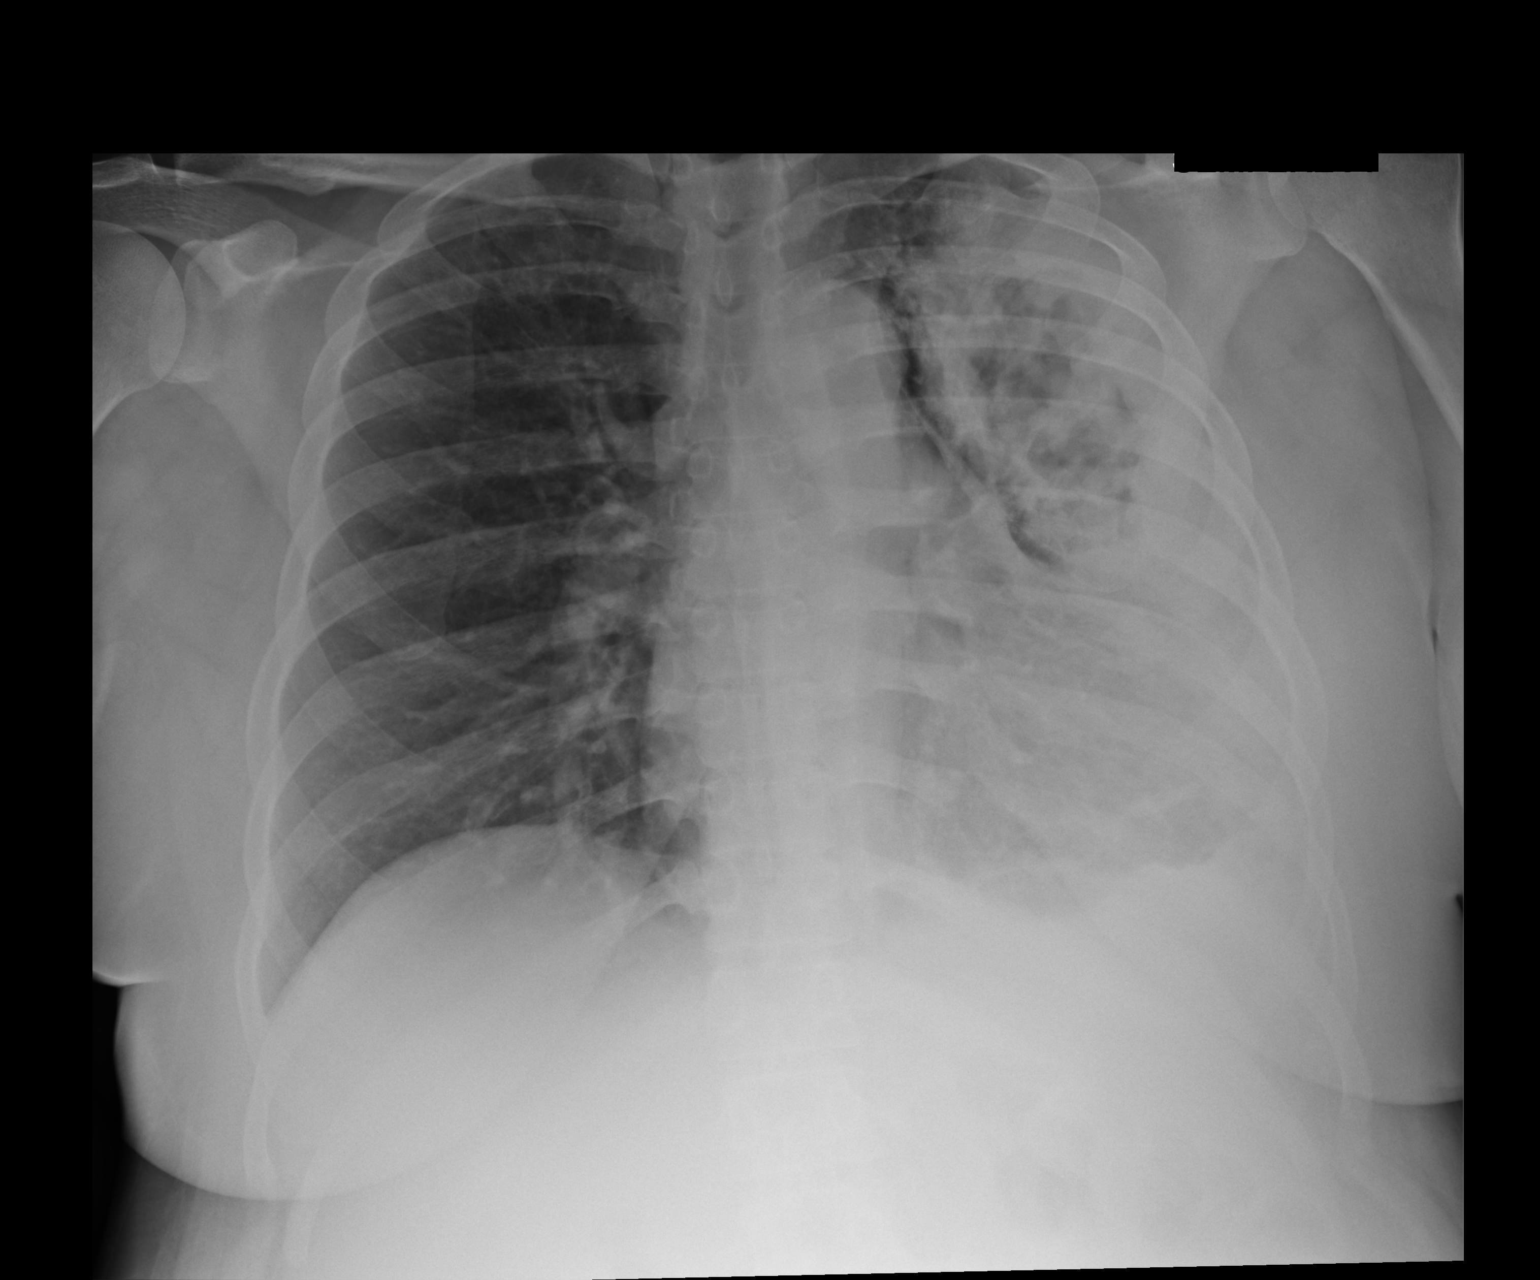

[1 of 1 positions shown; findings below may reference images not displayed]

FINDINGS: Normal tiny left apical pneumothorax is unchanged.

Large cavitary pneumonia left upper lobe again noted.  The patient
has developed a small left effusion with which appears larger
compared with earlier today.  Continued follow-up is suggested.

The right lung is clear.
IMPRESSION: Tiny left apical pneumothorax is unchanged.

Large cavitary pneumonia left upper lobe is unchanged.

Small left effusion appears larger compared with earlier today.

## 2015-03-15 ENCOUNTER — Other Ambulatory Visit: Payer: Self-pay

## 2015-03-21 ENCOUNTER — Ambulatory Visit: Payer: Self-pay

## 2015-03-21 ENCOUNTER — Other Ambulatory Visit (INDEPENDENT_AMBULATORY_CARE_PROVIDER_SITE_OTHER): Payer: Self-pay

## 2015-03-21 ENCOUNTER — Ambulatory Visit: Payer: Self-pay | Admitting: Infectious Disease

## 2015-03-21 DIAGNOSIS — I10 Essential (primary) hypertension: Secondary | ICD-10-CM

## 2015-03-21 DIAGNOSIS — B20 Human immunodeficiency virus [HIV] disease: Secondary | ICD-10-CM

## 2015-03-21 LAB — CBC WITH DIFFERENTIAL/PLATELET
Basophils Absolute: 0 10*3/uL (ref 0.0–0.1)
Basophils Relative: 0 % (ref 0–1)
EOS PCT: 2 % (ref 0–5)
Eosinophils Absolute: 0.1 10*3/uL (ref 0.0–0.7)
HCT: 35.1 % — ABNORMAL LOW (ref 36.0–46.0)
HEMOGLOBIN: 11.7 g/dL — AB (ref 12.0–15.0)
LYMPHS PCT: 21 % (ref 12–46)
Lymphs Abs: 1.4 10*3/uL (ref 0.7–4.0)
MCH: 28.1 pg (ref 26.0–34.0)
MCHC: 33.3 g/dL (ref 30.0–36.0)
MCV: 84.4 fL (ref 78.0–100.0)
MPV: 9.6 fL (ref 8.6–12.4)
Monocytes Absolute: 0.6 10*3/uL (ref 0.1–1.0)
Monocytes Relative: 9 % (ref 3–12)
NEUTROS ABS: 4.5 10*3/uL (ref 1.7–7.7)
NEUTROS PCT: 68 % (ref 43–77)
Platelets: 257 10*3/uL (ref 150–400)
RBC: 4.16 MIL/uL (ref 3.87–5.11)
RDW: 16.6 % — AB (ref 11.5–15.5)
WBC: 6.6 10*3/uL (ref 4.0–10.5)

## 2015-03-21 LAB — COMPLETE METABOLIC PANEL WITH GFR
ALT: 14 U/L (ref 6–29)
AST: 13 U/L (ref 10–30)
Albumin: 4 g/dL (ref 3.6–5.1)
Alkaline Phosphatase: 67 U/L (ref 33–115)
BILIRUBIN TOTAL: 0.3 mg/dL (ref 0.2–1.2)
BUN: 12 mg/dL (ref 7–25)
CO2: 23 mmol/L (ref 20–31)
Calcium: 9.1 mg/dL (ref 8.6–10.2)
Chloride: 107 mmol/L (ref 98–110)
Creat: 0.9 mg/dL (ref 0.50–1.10)
GFR, Est African American: >89 mL/min (ref >=60)
GFR, Est Non African American: 78 mL/min (ref >=60)
GLUCOSE: 96 mg/dL (ref 65–99)
POTASSIUM: 4 mmol/L (ref 3.5–5.3)
Sodium: 140 mmol/L (ref 135–146)
TOTAL PROTEIN: 7.3 g/dL (ref 6.1–8.1)

## 2015-03-22 LAB — HIV-1 RNA QUANT-NO REFLEX-BLD: HIV-1 RNA Quant, Log: <1.3 {Log} (ref <1.30)

## 2015-03-22 LAB — T-HELPER CELL (CD4) - (RCID CLINIC ONLY)
CD4 % Helper T Cell: 30 % — ABNORMAL LOW (ref 33–55)
CD4 T Cell Abs: 410 /uL (ref 400–2700)

## 2015-03-23 ENCOUNTER — Ambulatory Visit: Payer: Self-pay | Admitting: Infectious Disease

## 2015-03-23 IMAGING — CR DG CHEST 2V
2 series · 2 of 2 positions shown · non-contrast
Comparison: 04/29/2013

CLINICAL DATA: Cough for 2 weeks. Persistent fevers. Previous
testing suggests the patient has a left lung abscess.

EXAM:
CHEST  2 VIEW

[w chest pa]
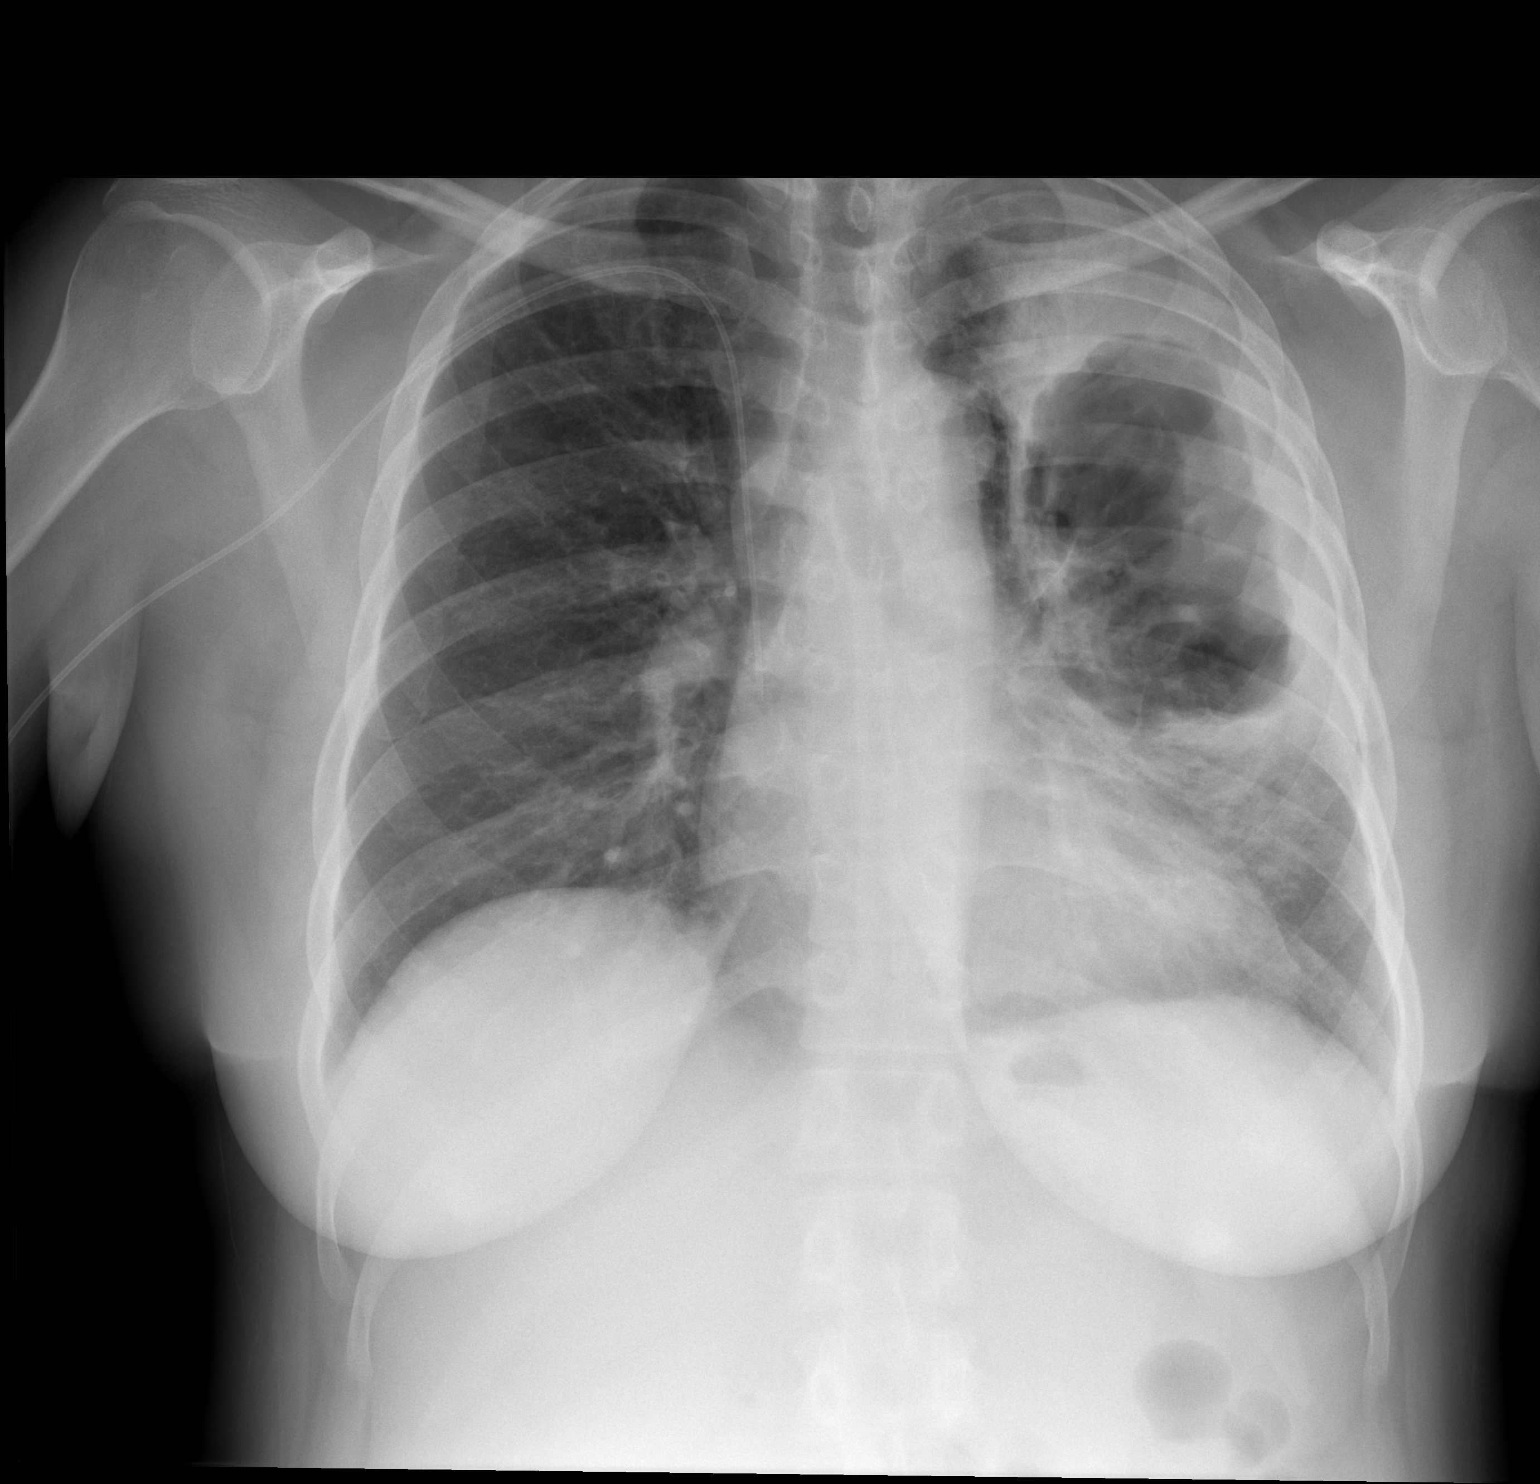

[w chest lat]
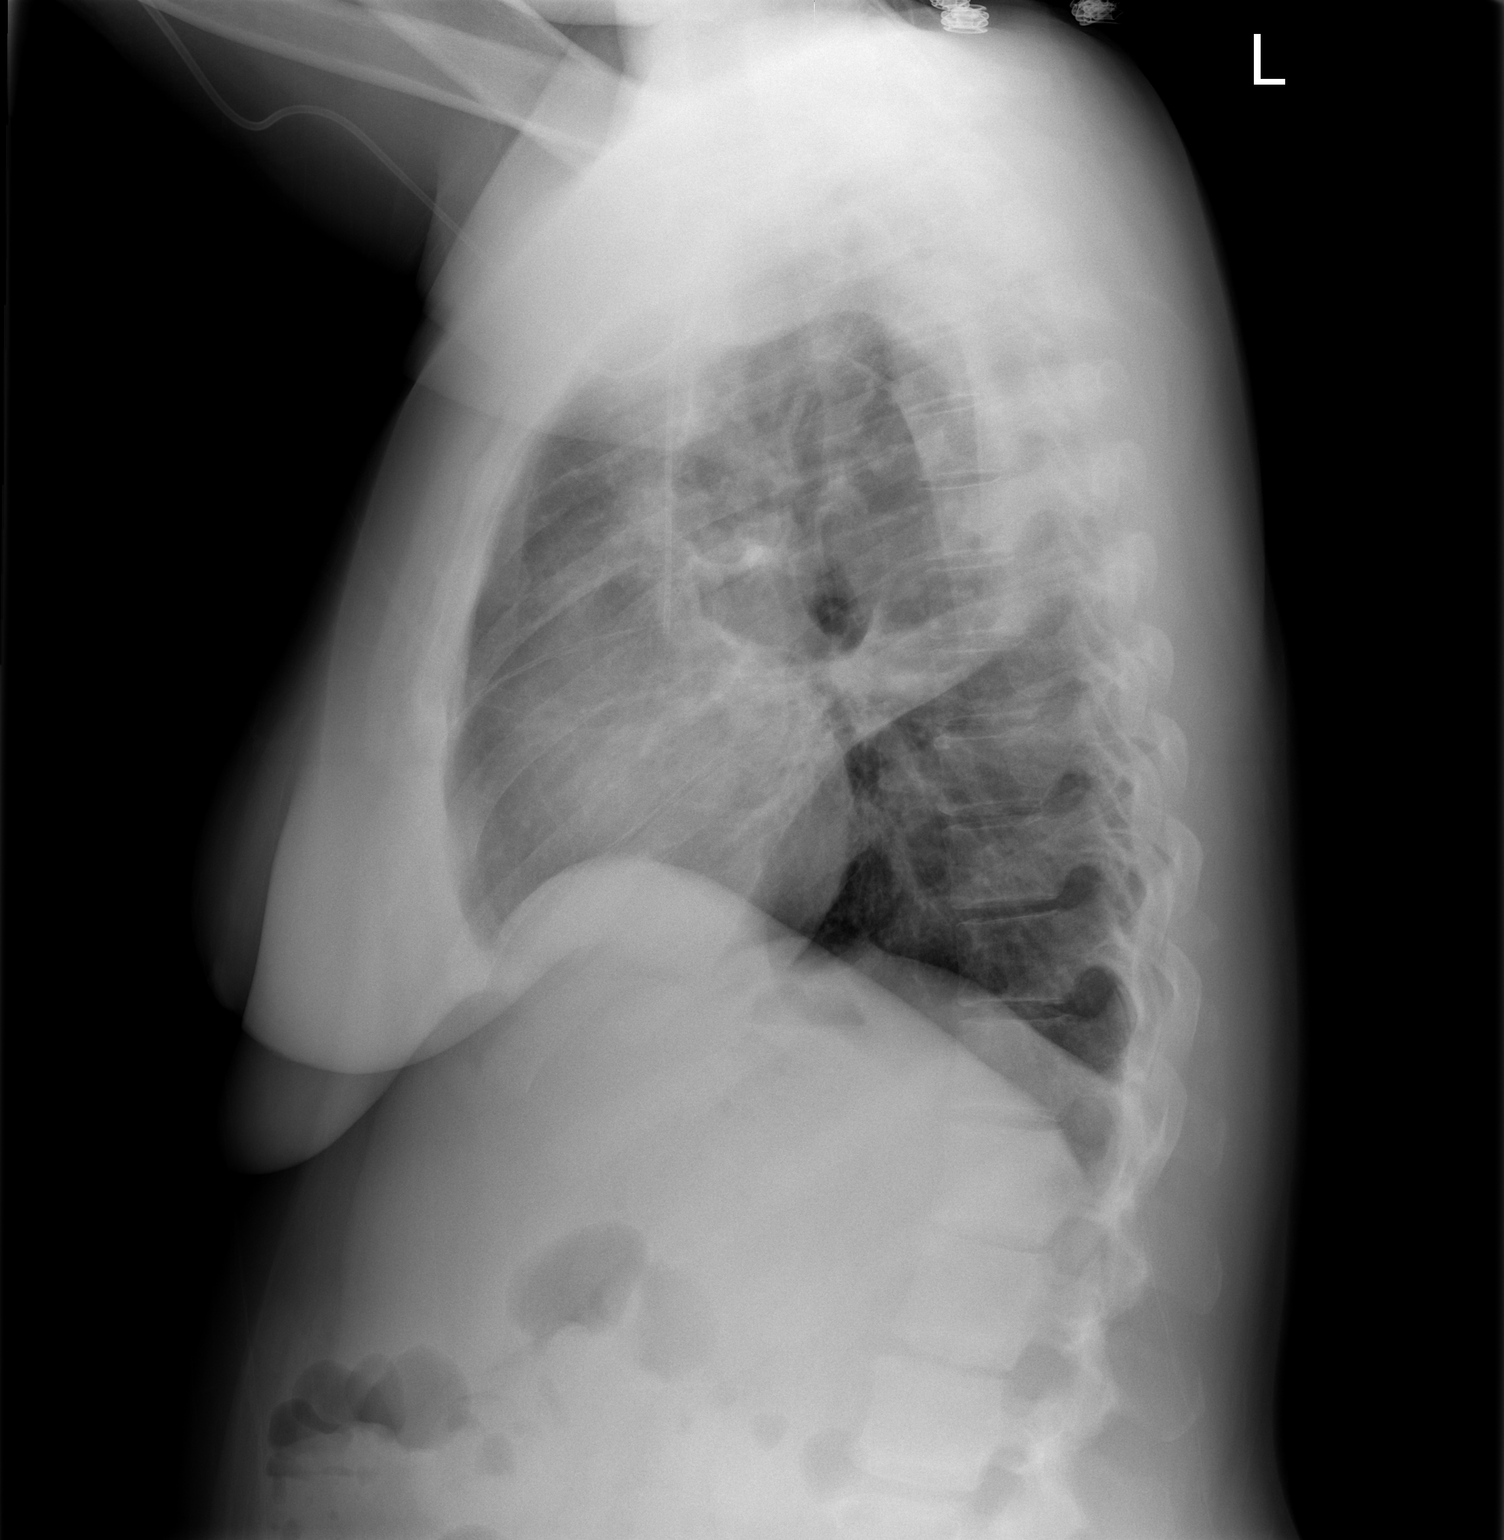

[2 of 2 positions shown; findings below may reference images not displayed]

FINDINGS: Two views of the chest were obtained. Again noted is a very large
cavitary lesion in the left lung. Cavitary portion measures up to 10
cm and previously measured 8.7 cm. There are persistent peripheral
opacities around the cavitary lesion which have minimally changed.
PICC line tip is in the lower SVC. Heart and mediastinum are stable.
Right lung remains clear. No evidence for pleural fluid.
IMPRESSION: Enlargement of the cavitary lesion in the left lung.

## 2015-04-20 ENCOUNTER — Encounter: Payer: Self-pay | Admitting: Infectious Disease

## 2015-04-20 ENCOUNTER — Ambulatory Visit (INDEPENDENT_AMBULATORY_CARE_PROVIDER_SITE_OTHER): Payer: Self-pay | Admitting: Infectious Disease

## 2015-04-20 VITALS — BP 156/107 | HR 80 | Temp 97.7°F | Wt 219.0 lb

## 2015-04-20 DIAGNOSIS — B20 Human immunodeficiency virus [HIV] disease: Secondary | ICD-10-CM

## 2015-04-20 DIAGNOSIS — I10 Essential (primary) hypertension: Secondary | ICD-10-CM

## 2015-04-20 DIAGNOSIS — E663 Overweight: Secondary | ICD-10-CM

## 2015-04-20 MED ORDER — AMLODIPINE BESYLATE 10 MG PO TABS: 10.0000 mg | ORAL_TABLET | Freq: Every day | ORAL | Status: DC

## 2015-04-20 MED ORDER — HYDROCHLOROTHIAZIDE 25 MG PO TABS: 25.0000 mg | ORAL_TABLET | Freq: Every day | ORAL | Status: DC

## 2015-04-20 MED ORDER — ABACAVIR-DOLUTEGRAVIR-LAMIVUD 600-50-300 MG PO TABS: 1.0000 | ORAL_TABLET | Freq: Every day | ORAL | Status: DC

## 2015-04-20 NOTE — Progress Notes (Signed)
Subjective:    Patient ID: Monique [name], female    DOB: 1970-12-29, 44 y.o.   MRN: [id number]  HPI   43 year old African American lady with HIV AIDS ( August 2014) also with lung abscess . She was initially treated for both lung abscess and possibility of pulmonary tuberculosis. See prior notes for workup ultimately all data was negative for pulmonary tuberculosis and she did finish a course of therapy for latent tuberculosis.  She also had protracted Augmentin .   I  simplified her antiretroviral medications to [facility] and she had been withan undetectable viral load since healthy CD4 count. However at hervisit  In July 2015 her viral load was suddenly 28,124 copies. Her genotype showed no resistance and no increased resistance.We got her renewed on ADAP and she was again nicely suppressed ever since then.  Lab Results  Component Value Date   HIV1RNAQUANT <20 03/21/2015   Lab Results  Component Value Date   CD4TABS 410 03/21/2015   CD4TABS 370* 12/30/2014   CD4TABS 470 08/17/2014    We need to make sure she is ADAP enrolled and if not enroll her also into [employer].  Review of Systems  Constitutional: Negative for chills, diaphoresis, activity change, appetite change, fatigue and unexpected weight change.  HENT: Negative for facial swelling, rhinorrhea, sinus pressure, sneezing and trouble swallowing.   Eyes: Negative for photophobia and visual disturbance.  Respiratory: Negative for chest tightness and stridor.   Cardiovascular: Negative for palpitations and leg swelling.  Gastrointestinal: Negative for constipation, blood in stool, abdominal distention and anal bleeding.  Genitourinary: Negative for dysuria, hematuria, flank pain and difficulty urinating.  Musculoskeletal: Negative for myalgias, back pain, joint swelling, arthralgias and gait problem.  Skin: Negative for color change, pallor and wound.  Neurological: Negative for dizziness, tremors, weakness,  light-headedness and headaches.  Hematological: Negative for adenopathy. Does not bruise/bleed easily.  Psychiatric/Behavioral: Negative for behavioral problems, confusion, sleep disturbance, dysphoric mood, decreased concentration and agitation.       Objective:   Physical Exam  Constitutional: She is oriented to person, place, and time. She appears well-developed and well-nourished. No distress.  HENT:  Head: Normocephalic and atraumatic.  Mouth/Throat: Oropharynx is clear and moist.  Eyes: Conjunctivae and EOM are normal. Pupils are equal, round, and reactive to light. No scleral icterus.  Neck: Normal range of motion. Neck supple.  Cardiovascular: Normal rate, regular rhythm and normal heart sounds.  Exam reveals no gallop and no friction rub.   No murmur heard. Pulmonary/Chest: Effort normal and breath sounds normal. No respiratory distress. She has no wheezes. She has no rales. She exhibits no tenderness.  Abdominal: Soft. She exhibits no distension.  Musculoskeletal: She exhibits no edema or tenderness.  Neurological: She is alert and oriented to person, place, and time. She exhibits normal muscle tone. Coordination normal.  Skin: Skin is warm. She is not diaphoretic.  Psychiatric: She has a normal mood and affect. Her speech is slurred.       Assessment & Plan:   HIV/AIDS: -Continue TRIUMEQ ensure ADAP renewal, [place] if needed, RTC in 3-4 months for labs and ADAP renewal   Hypertension: continue amlodipine, also resume diuretic  Filed Vitals:   04/20/15 1050  BP: 156/107  Pulse: 80  Temp: 97.7 F (36.5 C)    Overweight: See above plan for losing weight with carbohydrate restriction.  I spent greater than 25 minutes with the patient including greater than 50% of time in face to face counsel of  the patient re her HIV, HTN, and obesity and in coordination of their care.

## 2015-07-06 ENCOUNTER — Telehealth: Payer: Self-pay

## 2015-07-06 DIAGNOSIS — R309 Painful micturition, unspecified: Secondary | ICD-10-CM

## 2015-07-06 NOTE — Telephone Encounter (Signed)
Patient states she is having some burning after she urinates. She purchased Azo-Standard today and has mild relief.  She would like to come and leave a urine sample.  Patient advised to come for lab on tomorrow.   Order entered.

## 2015-07-07 ENCOUNTER — Other Ambulatory Visit (INDEPENDENT_AMBULATORY_CARE_PROVIDER_SITE_OTHER): Payer: Self-pay

## 2015-07-07 DIAGNOSIS — B20 Human immunodeficiency virus [HIV] disease: Secondary | ICD-10-CM

## 2015-07-07 DIAGNOSIS — I10 Essential (primary) hypertension: Secondary | ICD-10-CM

## 2015-07-07 DIAGNOSIS — Z113 Encounter for screening for infections with a predominantly sexual mode of transmission: Secondary | ICD-10-CM

## 2015-07-07 DIAGNOSIS — Z79899 Other long term (current) drug therapy: Secondary | ICD-10-CM

## 2015-07-07 DIAGNOSIS — R309 Painful micturition, unspecified: Secondary | ICD-10-CM

## 2015-07-07 LAB — CBC WITH DIFFERENTIAL/PLATELET
BASOS ABS: 0 10*3/uL (ref 0.0–0.1)
Basophils Relative: 0 % (ref 0–1)
EOS ABS: 0.1 10*3/uL (ref 0.0–0.7)
EOS PCT: 1 % (ref 0–5)
HCT: 39.2 % (ref 36.0–46.0)
Hemoglobin: 13.2 g/dL (ref 12.0–15.0)
LYMPHS ABS: 2.1 10*3/uL (ref 0.7–4.0)
Lymphocytes Relative: 18 % (ref 12–46)
MCH: 27.5 pg (ref 26.0–34.0)
MCHC: 33.7 g/dL (ref 30.0–36.0)
MCV: 81.7 fL (ref 78.0–100.0)
MONO ABS: 1.1 10*3/uL — AB (ref 0.1–1.0)
MPV: 10.4 fL (ref 8.6–12.4)
Monocytes Relative: 9 % (ref 3–12)
Neutro Abs: 8.4 10*3/uL — ABNORMAL HIGH (ref 1.7–7.7)
Neutrophils Relative %: 72 % (ref 43–77)
PLATELETS: 311 10*3/uL (ref 150–400)
RBC: 4.8 MIL/uL (ref 3.87–5.11)
RDW: 15.9 % — ABNORMAL HIGH (ref 11.5–15.5)
WBC: 11.7 10*3/uL — AB (ref 4.0–10.5)

## 2015-07-07 LAB — LIPID PANEL
Cholesterol: 213 mg/dL — ABNORMAL HIGH (ref 125–200)
HDL: 39 mg/dL — AB (ref >=46)
LDL CALC: 158 mg/dL — AB (ref <130)
Total CHOL/HDL Ratio: 5.5 Ratio — ABNORMAL HIGH (ref <=5.0)
Triglycerides: 79 mg/dL (ref <150)
VLDL: 16 mg/dL (ref <30)

## 2015-07-07 LAB — COMPLETE METABOLIC PANEL WITH GFR
ALK PHOS: 84 U/L (ref 33–115)
ALT: 15 U/L (ref 6–29)
AST: 16 U/L (ref 10–30)
Albumin: 4.5 g/dL (ref 3.6–5.1)
BUN: 13 mg/dL (ref 7–25)
CO2: 22 mmol/L (ref 20–31)
Calcium: 9.7 mg/dL (ref 8.6–10.2)
Chloride: 99 mmol/L (ref 98–110)
Creat: 0.88 mg/dL (ref 0.50–1.10)
GFR, EST NON AFRICAN AMERICAN: 80 mL/min (ref >=60)
GFR, Est African American: >89 mL/min (ref >=60)
GLUCOSE: 85 mg/dL (ref 65–99)
POTASSIUM: 3.9 mmol/L (ref 3.5–5.3)
SODIUM: 132 mmol/L — AB (ref 135–146)
Total Bilirubin: 0.4 mg/dL (ref 0.2–1.2)
Total Protein: 8.4 g/dL — ABNORMAL HIGH (ref 6.1–8.1)

## 2015-07-08 LAB — URINALYSIS
BILIRUBIN URINE: NEGATIVE
Glucose, UA: NEGATIVE
KETONES UR: NEGATIVE
NITRITE: POSITIVE — AB
PH: 5.5 (ref 5.0–8.0)
SPECIFIC GRAVITY, URINE: 1.011 (ref 1.001–1.035)

## 2015-07-08 LAB — T-HELPER CELL (CD4) - (RCID CLINIC ONLY)
CD4 % Helper T Cell: 23 % — ABNORMAL LOW (ref 33–55)
CD4 T Cell Abs: 530 /uL (ref 400–2700)

## 2015-07-08 LAB — URINE CYTOLOGY ANCILLARY ONLY
CHLAMYDIA, DNA PROBE: NEGATIVE
NEISSERIA GONORRHEA: NEGATIVE

## 2015-07-08 LAB — RPR

## 2015-07-12 LAB — HIV-1 RNA QUANT-NO REFLEX-BLD: HIV 1 RNA Quant: <20 copies/mL (ref <20)

## 2015-07-14 LAB — HLA B*5701: HLA-B 5701 W/RFLX HLA-B HIGH: NEGATIVE

## 2015-07-15 ENCOUNTER — Other Ambulatory Visit: Payer: Self-pay | Admitting: Infectious Disease

## 2015-08-10 ENCOUNTER — Ambulatory Visit (INDEPENDENT_AMBULATORY_CARE_PROVIDER_SITE_OTHER): Payer: Self-pay | Admitting: Infectious Disease

## 2015-08-10 ENCOUNTER — Encounter: Payer: Self-pay | Admitting: Infectious Disease

## 2015-08-10 VITALS — BP 142/100 | HR 80 | Temp 98.2°F | Ht 62.0 in | Wt 224.8 lb

## 2015-08-10 DIAGNOSIS — B20 Human immunodeficiency virus [HIV] disease: Secondary | ICD-10-CM

## 2015-08-10 DIAGNOSIS — I10 Essential (primary) hypertension: Secondary | ICD-10-CM

## 2015-08-10 DIAGNOSIS — E669 Obesity, unspecified: Secondary | ICD-10-CM

## 2015-08-10 NOTE — Patient Instructions (Signed)
EXCELLENT JOB TAKING YOUR MEDS!  TRY THE "ATKINS" DIET, LOW CARBOHYDRATE DIET, LESS THAN 20 GRAMS PER DAY IN THE FIRST 2 WEEKS TO HELP WITH WEIGHT LOSS   MERRY XMAS!!

## 2015-08-10 NOTE — Progress Notes (Signed)
Chief complaint is weight gain Subjective:    Patient ID: Monique DecreeValerie T Phoenix, female    DOB: [birth year], 44 y.o.   MRN: [id number]  HPI   44 year old African American lady with HIV AIDS ( August 2014) also with lung abscess . She was initially treated for both lung abscess and possibility of pulmonary tuberculosis. See prior notes for workup ultimately all data was negative for pulmonary tuberculosis and she did finish a course of therapy for latent tuberculosis.  She also had protracted Augmentin .   [facility]  [facility] and she had been withan undetectable viral load since healthy CD4 count. However at hervisit  In July 2015 her viral load was suddenly 28,124 copies. Her genotype showed no resistance and no increased resistance.We got her renewed on ADAP and she was again nicely suppressed ever since then.  He is still a bit upset about the fact that she has gained a significant amount of weight nearly 80 pounds since suppressing her virus and normalizing her life.  We  discussed possibility of a low carbohydrate diet such as the [employer] diet.  Blood pressure was again elevated she thinks in part due to an argument she had with her daughter who was just diagnosed with postherpetic stress disorder.  Lab Results  Component Value Date   HIV1RNAQUANT <20 07/07/2015   Lab Results  Component Value Date   CD4TABS 530 07/07/2015   CD4TABS 410 03/21/2015   CD4TABS 370* 12/30/2014    We need to make sure she is ADAP enrolled in early January.  Past Medical History  Diagnosis Date  . Tonsillitis   . Hypertension   . TB (tuberculosis)   . Pneumonia   . Lung abscess (HCC)   . HIV (human immunodeficiency virus infection) (HCC)   . Lower extremity edema 01/12/2015  . Overweight 01/12/2015  . 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 Cesarean section    . Video  bronchoscopy Bilateral 04/13/2013    Procedure: VIDEO BRONCHOSCOPY WITHOUT FLUORO;  Surgeon: Kalman ShanMurali Ramaswamy, MD;  [facility];  Service: Cardiopulmonary;  Laterality: Bilateral;    Family History  Problem Relation Age of Onset  . Diabetes Sister   . Hypertension Sister       Social History   Social History  . Marital Status: Single    Spouse Name: N/A  . Number of Children: N/A  . Years of Education: N/A   Social History Main Topics  . Smoking status: Never Smoker   . Smokeless tobacco: None  . Alcohol Use: No  . Drug Use: No  . Sexual Activity: Not Asked   Other Topics Concern  . None   Social History Narrative    No Known Allergies   Current outpatient prescriptions:  .  Abacavir-Dolutegravir-Lamivud 600-50-300 MG TABS, Take 1 tablet by mouth daily., Disp: 30 tablet, Rfl: 11 .  amLODipine (NORVASC) 10 MG tablet, Take 1 tablet (10 mg total) by mouth daily., Disp: 30 tablet, Rfl: 11 .  hydrochlorothiazide (HYDRODIURIL) 25 MG tablet, Take 1 tablet (25 mg total) by mouth daily., Disp: 30 tablet, Rfl: 11 .  TRIUMEQ 600-50-300 MG TABS, TAKE 1 TABLET BY MOUTH DAILY, Disp: 30 tablet, Rfl: 4   Review of Systems  Constitutional: Negative for chills, diaphoresis, activity change, appetite change, fatigue and unexpected weight change.  HENT: Negative for facial swelling, rhinorrhea, sinus pressure, sneezing and trouble swallowing.  Eyes: Negative for photophobia and visual disturbance.  Respiratory: Negative for chest tightness and stridor.   Cardiovascular: Negative for palpitations and leg swelling.  Gastrointestinal: Negative for constipation, blood in stool, abdominal distention and anal bleeding.  Genitourinary: Negative for dysuria, hematuria, flank pain and difficulty urinating.  Musculoskeletal: Negative for myalgias, back pain, joint swelling, arthralgias and gait problem.  Skin: Negative for color change, pallor and wound.  Neurological: Negative for  dizziness, tremors, weakness, light-headedness and headaches.  Hematological: Negative for adenopathy. Does not bruise/bleed easily.  Psychiatric/Behavioral: Negative for behavioral problems, confusion, sleep disturbance, dysphoric mood, decreased concentration and agitation.       Objective:   Physical Exam  Constitutional: She is oriented to person, place, and time. She appears well-developed and well-nourished. No distress.  HENT:  Head: Normocephalic and atraumatic.  Mouth/Throat: Oropharynx is clear and moist.  Eyes: Conjunctivae and EOM are normal. Pupils are equal, round, and reactive to light. No scleral icterus.  Neck: Normal range of motion. Neck supple.  Cardiovascular: Normal rate, regular rhythm and normal heart sounds.  Exam reveals no gallop and no friction rub.   No murmur heard. Pulmonary/Chest: Effort normal and breath sounds normal. No respiratory distress. She has no wheezes. She has no rales. She exhibits no tenderness.  Abdominal: Soft. She exhibits no distension.  Musculoskeletal: She exhibits no edema or tenderness.  Neurological: She is alert and oriented to person, place, and time. She exhibits normal muscle tone. Coordination normal.  Skin: Skin is warm. She is not diaphoretic.  Psychiatric: She has a normal mood and affect. Her speech is slurred.       Assessment & Plan:   HIV/AIDS: -Continue TRIUMEQ ensure ADAP renewal, [place] if needed, RTC in 3-4 months for labs    Hypertension: continue amlodipine, and diuretic  Filed Vitals:   08/10/15 1113  BP: 142/100  Pulse: 80  Temp: 98.2 F (36.8 C)    Overweight: See above plan for losing weight with carbohydrate restriction.  I spent greater than 40 minutes with the patient including greater than 50% of time in face to face counsel of the patient re her HIV, HTN, and obesity and in coordination of their care.

## 2015-08-30 ENCOUNTER — Ambulatory Visit: Payer: Self-pay

## 2015-11-14 ENCOUNTER — Ambulatory Visit: Payer: Self-pay | Admitting: Infectious Disease

## 2015-12-19 ENCOUNTER — Ambulatory Visit: Payer: Self-pay | Admitting: Infectious Disease

## 2016-01-16 ENCOUNTER — Telehealth: Payer: Self-pay | Admitting: *Deleted

## 2016-01-16 NOTE — Telephone Encounter (Signed)
[facility], [facility] Patient failed to renew ADAP, it has expired. Transfered call to Con MemosMichelle Evans. Andree CossHowell, Brinleigh Tew M, RN

## 2016-01-27 ENCOUNTER — Encounter: Payer: Self-pay | Admitting: Infectious Disease

## 2016-02-29 ENCOUNTER — Ambulatory Visit (INDEPENDENT_AMBULATORY_CARE_PROVIDER_SITE_OTHER): Payer: Self-pay | Admitting: Infectious Disease

## 2016-02-29 ENCOUNTER — Encounter: Payer: Self-pay | Admitting: Infectious Disease

## 2016-02-29 ENCOUNTER — Ambulatory Visit: Payer: Self-pay

## 2016-02-29 VITALS — BP 139/83 | HR 86 | Temp 98.0°F | Ht 60.0 in | Wt 223.0 lb

## 2016-02-29 DIAGNOSIS — Z113 Encounter for screening for infections with a predominantly sexual mode of transmission: Secondary | ICD-10-CM

## 2016-02-29 DIAGNOSIS — B20 Human immunodeficiency virus [HIV] disease: Secondary | ICD-10-CM

## 2016-02-29 DIAGNOSIS — Z79899 Other long term (current) drug therapy: Secondary | ICD-10-CM

## 2016-02-29 DIAGNOSIS — I10 Essential (primary) hypertension: Secondary | ICD-10-CM

## 2016-02-29 LAB — COMPLETE METABOLIC PANEL WITH GFR
ALBUMIN: 3.8 g/dL (ref 3.6–5.1)
ALK PHOS: 65 U/L (ref 33–115)
ALT: 17 U/L (ref 6–29)
AST: 13 U/L (ref 10–35)
BILIRUBIN TOTAL: 0.4 mg/dL (ref 0.2–1.2)
BUN: 16 mg/dL (ref 7–25)
CO2: 23 mmol/L (ref 20–31)
Calcium: 9.3 mg/dL (ref 8.6–10.2)
Chloride: 107 mmol/L (ref 98–110)
Creat: 0.81 mg/dL (ref 0.50–1.10)
GFR, Est African American: >89 mL/min (ref >=60)
GFR, Est Non African American: 88 mL/min (ref >=60)
GLUCOSE: 86 mg/dL (ref 65–99)
Potassium: 4.1 mmol/L (ref 3.5–5.3)
SODIUM: 136 mmol/L (ref 135–146)
TOTAL PROTEIN: 7.2 g/dL (ref 6.1–8.1)

## 2016-02-29 LAB — CBC WITH DIFFERENTIAL/PLATELET
BASOS ABS: 0 {cells}/uL (ref 0–200)
Basophils Relative: 0 %
EOS ABS: 79 {cells}/uL (ref 15–500)
Eosinophils Relative: 1 %
HCT: 35.8 % (ref 35.0–45.0)
HEMOGLOBIN: 11.9 g/dL (ref 11.7–15.5)
Lymphocytes Relative: 24 %
Lymphs Abs: 1896 cells/uL (ref 850–3900)
MCH: 27.7 pg (ref 27.0–33.0)
MCHC: 33.2 g/dL (ref 32.0–36.0)
MCV: 83.3 fL (ref 80.0–100.0)
MONOS PCT: 12 %
MPV: 9.8 fL (ref 7.5–12.5)
Monocytes Absolute: 948 cells/uL (ref 200–950)
NEUTROS ABS: 4977 {cells}/uL (ref 1500–7800)
NEUTROS PCT: 63 %
PLATELETS: 303 10*3/uL (ref 140–400)
RBC: 4.3 MIL/uL (ref 3.80–5.10)
RDW: 16.5 % — ABNORMAL HIGH (ref 11.0–15.0)
WBC: 7.9 10*3/uL (ref 3.8–10.8)

## 2016-02-29 LAB — LIPID PANEL
CHOL/HDL RATIO: 4.9 ratio (ref <=5.0)
Cholesterol: 177 mg/dL (ref 125–200)
HDL: 36 mg/dL — ABNORMAL LOW (ref >=46)
LDL Cholesterol: 114 mg/dL (ref <130)
Triglycerides: 136 mg/dL (ref <150)
VLDL: 27 mg/dL (ref <30)

## 2016-02-29 NOTE — Progress Notes (Signed)
Chief complaint is followup for HIV on meds, weight gain Subjective:    Patient ID: Monique [name], female    DOB: 09-Jul-1971, 45 y.o.   MRN: [id number]  HPI   45 year old African American lady with HIV AIDS ( August 2014) also with lung abscess . She was initially treated for both lung abscess and possibility of pulmonary tuberculosis. See prior notes for workup ultimately all data was negative for pulmonary tuberculosis and she did finish a course of therapy for latent tuberculosis.  She also had protracted Augmentin .   [facility]  [facility] and she had been withan undetectable viral load since healthy CD4 count. However at hervisit  In July 2015 her viral load was suddenly 28,124 copies. Her genotype showed no resistance and no increased resistance.We got her renewed on ADAP and she was again nicely suppressed ever since then.  He is still a bit upset about the fact that she has gained a significant amount of weight nearly 80 pounds since suppressing her virus and normalizing her life.  We  discussed possibility of a low carbohydrate diet such as the [employer] diet at last visit and again at today's visit.  She failed to renew ADAP on-time but she was able to obtain meds via help from clini and she states that she has not missed doses.   Lab Results  Component Value Date   HIV1RNAQUANT <20 07/07/2015   HIV1RNAQUANT <20 03/21/2015   HIV1RNAQUANT <20 12/30/2014      Lab Results  Component Value Date   CD4TABS 530 07/07/2015   CD4TABS 410 03/21/2015   CD4TABS 370* 12/30/2014     Past Medical History  Diagnosis Date  . Tonsillitis   . Hypertension   . TB (tuberculosis)   . Pneumonia   . Lung abscess (HCC)   . HIV (human immunodeficiency virus infection) (HCC)   . Lower extremity edema 01/12/2015  . Overweight 01/12/2015  . 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 Cesarean section    . Video bronchoscopy Bilateral 04/13/2013    Procedure: VIDEO BRONCHOSCOPY WITHOUT FLUORO;  Surgeon: Kalman ShanMurali Ramaswamy, MD;  [facility];  Service: Cardiopulmonary;  Laterality: Bilateral;    Family History  Problem Relation Age of Onset  . Diabetes Sister   . Hypertension Sister       Social History   Social History  . Marital Status: Single    Spouse Name: N/A  . Number of Children: N/A  . Years of Education: N/A   Social History Main Topics  . Smoking status: Never Smoker   . Smokeless tobacco: Not on file  . Alcohol Use: No  . Drug Use: No  . Sexual Activity: Not on file   Other Topics Concern  . Not on file   Social History Narrative    No Known Allergies   Current outpatient prescriptions:  .  Abacavir-Dolutegravir-Lamivud 600-50-300 MG TABS, Take 1 tablet by mouth daily., Disp: 30 tablet, Rfl: 11 .  amLODipine (NORVASC) 10 MG tablet, Take 1 tablet (10 mg total) by mouth daily., Disp: 30 tablet, Rfl: 11 .  hydrochlorothiazide (HYDRODIURIL) 25 MG tablet, Take 1 tablet (25 mg total) by mouth daily., Disp: 30 tablet, Rfl: 11 .  TRIUMEQ 600-50-300 MG TABS, TAKE 1 TABLET BY MOUTH DAILY, Disp: 30 tablet, Rfl: 4   Review of Systems  Constitutional: Negative for chills, diaphoresis, activity change,  appetite change, fatigue and unexpected weight change.  HENT: Negative for facial swelling, rhinorrhea, sinus pressure, sneezing and trouble swallowing.   Eyes: Negative for photophobia and visual disturbance.  Respiratory: Negative for chest tightness and stridor.   Cardiovascular: Negative for palpitations and leg swelling.  Gastrointestinal: Negative for constipation, blood in stool, abdominal distention and anal bleeding.  Genitourinary: Negative for dysuria, hematuria, flank pain and difficulty urinating.  Musculoskeletal: Negative for myalgias, back pain, joint swelling, arthralgias and gait problem.  Skin: Negative  for color change, pallor and wound.  Neurological: Negative for dizziness, tremors, weakness, light-headedness and headaches.  Hematological: Negative for adenopathy. Does not bruise/bleed easily.  Psychiatric/Behavioral: Negative for behavioral problems, confusion, sleep disturbance, dysphoric mood, decreased concentration and agitation.       Objective:   Physical Exam  Constitutional: She is oriented to person, place, and time. She appears well-developed and well-nourished. No distress.  HENT:  Head: Normocephalic and atraumatic.  Mouth/Throat: Oropharynx is clear and moist.  Eyes: Conjunctivae and EOM are normal. Pupils are equal, round, and reactive to light. No scleral icterus.  Neck: Normal range of motion. Neck supple.  Cardiovascular: Normal rate, regular rhythm and normal heart sounds.  Exam reveals no gallop and no friction rub.   No murmur heard. Pulmonary/Chest: Effort normal and breath sounds normal. No respiratory distress. She has no wheezes. She has no rales. She exhibits no tenderness.  Abdominal: Soft. She exhibits no distension.  Musculoskeletal: She exhibits no edema or tenderness.  Neurological: She is alert and oriented to person, place, and time. She exhibits normal muscle tone. Coordination normal.  Skin: Skin is warm. She is not diaphoretic.  Psychiatric: She has a normal mood and affect. Her speech is slurred.       Assessment & Plan:   HIV/AIDS: -Continue TRIUMEQ ensure ADAP renewal today, RTC in January 2018  Hypertension: continue amlodipine, and diuretic  There were no vitals filed for this visit.  Overweight: try lowcarbohydrate diet and exercise  I spent greater than 25 minutes with the patient including greater than 50% of time in face to face counsel of the patient re her HIV, HTN, and obesity and in coordination of her care.

## 2016-03-01 LAB — URINE CYTOLOGY ANCILLARY ONLY
Chlamydia: NEGATIVE
NEISSERIA GONORRHEA: NEGATIVE

## 2016-03-01 LAB — MICROALBUMIN / CREATININE URINE RATIO
Creatinine, Urine: 106 mg/dL (ref 20–320)
MICROALB UR: 0.6 mg/dL
Microalb Creat Ratio: 6 mcg/mg creat (ref <30)

## 2016-03-01 LAB — RPR

## 2016-03-01 LAB — T-HELPER CELL (CD4) - (RCID CLINIC ONLY)
CD4 % Helper T Cell: 29 % — ABNORMAL LOW (ref 33–55)
CD4 T CELL ABS: 580 /uL (ref 400–2700)

## 2016-03-03 LAB — HIV RNA, RTPCR W/R GT (RTI, PI,INT): HIV-1 RNA Quant, Log: <1.3 Log copies/mL

## 2016-03-19 ENCOUNTER — Other Ambulatory Visit: Payer: Self-pay | Admitting: Internal Medicine

## 2016-03-22 ENCOUNTER — Ambulatory Visit (HOSPITAL_COMMUNITY)
Admission: EM | Admit: 2016-03-22 | Discharge: 2016-03-22 | Disposition: A | Discharge status: Home / Self Care | Payer: Self-pay | Attending: Physician Assistant | Admitting: Physician Assistant

## 2016-03-22 ENCOUNTER — Encounter (HOSPITAL_COMMUNITY): Payer: Self-pay | Admitting: Emergency Medicine

## 2016-03-22 DIAGNOSIS — J020 Streptococcal pharyngitis: Secondary | ICD-10-CM

## 2016-03-22 LAB — POCT RAPID STREP A: STREPTOCOCCUS, GROUP A SCREEN (DIRECT): POSITIVE — AB

## 2016-03-22 MED ORDER — PENICILLIN G BENZATHINE 1200000 UNIT/2ML IM SUSP
1.2000 10*6.[IU] | Freq: Once | INTRAMUSCULAR | Status: AC
  Administered 2016-03-22: 1.2 10*6.[IU] via INTRAMUSCULAR

## 2016-03-22 MED ORDER — IBUPROFEN 800 MG PO TABS
800.0000 mg | ORAL_TABLET | Freq: Once | ORAL | Status: AC
  Administered 2016-03-22: 800 mg via ORAL

## 2016-03-22 MED ORDER — IBUPROFEN 800 MG PO TABS
ORAL_TABLET | ORAL | Status: AC
  Filled 2016-03-22: qty 1

## 2016-03-22 MED ORDER — PREDNISONE 10 MG PO TABS: 20.0000 mg | ORAL_TABLET | Freq: Every day | ORAL | 0 refills | Status: DC

## 2016-03-22 MED ORDER — PENICILLIN G BENZATHINE 1200000 UNIT/2ML IM SUSP
INTRAMUSCULAR | Status: AC
  Filled 2016-03-22: qty 2

## 2016-03-22 MED ORDER — METHYLPREDNISOLONE ACETATE 80 MG/ML IJ SUSP
INTRAMUSCULAR | Status: AC
  Filled 2016-03-22: qty 1

## 2016-03-22 MED ORDER — IBUPROFEN 800 MG PO TABS
ORAL_TABLET | ORAL | Status: AC
Start: 2016-03-22 — End: 2016-03-22
  Filled 2016-03-22: qty 1

## 2016-03-22 MED ORDER — METHYLPREDNISOLONE ACETATE 80 MG/ML IJ SUSP
80.0000 mg | Freq: Once | INTRAMUSCULAR | Status: AC
  Administered 2016-03-22: 80 mg via INTRAMUSCULAR

## 2016-03-22 NOTE — ED Provider Notes (Signed)
CSN: [id number]     Arrival date & time 03/22/16  0957 History   First MD Initiated Contact with Patient 03/22/16 1028     Chief Complaint  Patient presents with  . Fever  . Sore Throat   (Consider location/radiation/quality/duration/timing/severity/associated sxs/prior Treatment) HPI History obtained from patient:   LOCATION:throat SEVERITY:6 DURATION:1 day CONTEXT:sudden onset fever and throat pain QUALITY:scratchy MODIFYING FACTORS: Tylenol, ibuprofen, salt water gargles OTC meds without relief ASSOCIATED SYMPTOMS:hurts to swallow, fever,  cough TIMING:now constant  Past Medical History:  Diagnosis Date  . HIV (human immunodeficiency virus infection) (HCC)   . Hypertension   . 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 Lower extremity edema 01/12/2015  . Lung abscess (HCC)   . Overweight 01/12/2015  . Pneumonia   . TB (tuberculosis)   . Tonsillitis    Past Surgical History:  Procedure Laterality Date  . CESAREAN SECTION    . VIDEO BRONCHOSCOPY Bilateral 04/13/2013   Procedure: VIDEO BRONCHOSCOPY WITHOUT FLUORO;  Surgeon: Kalman Shan, MD;  Location[facility];  Service: Cardiopulmonary;  Laterality: Bilateral;   Family History  Problem Relation Age of Onset  . Diabetes Sister   . Hypertension Sister    Social History  Substance Use Topics  . Smoking status: Never Smoker  . Smokeless tobacco: Never Used  . Alcohol use No   OB History    No data available     Review of Systems  Denies: HEADACHE, NAUSEA, ABDOMINAL PAIN, CHEST PAIN, CONGESTION, DYSURIA, SHORTNESS OF BREATH  Allergies  Review of patient's allergies indicates no known allergies.  Home Medications   Prior to Admission medications   Medication Sig Start Date End Date Taking? Authorizing Provider  amLODipine (NORVASC) 10 MG tablet Take 1 tablet (10 mg total) by mouth daily. 04/20/15  Yes Randall Hiss, MD  hydrochlorothiazide (HYDRODIURIL) 25  MG tablet Take 1 tablet (25 mg total) by mouth daily. 04/20/15  Yes Randall Hiss, MD  TRIUMEQ 600-50-300 MG tablet TAKE 1 TABLET BY MOUTH DAILY 03/19/16  Yes Judyann Munson, MD  Abacavir-Dolutegravir-Lamivud 600-50-300 MG TABS Take 1 tablet by mouth daily. 04/20/15   Randall Hiss, MD   Meds Ordered and Administered this Visit   Medications  penicillin g benzathine (BICILLIN LA) 1200000 UNIT/2ML injection 1.2 Million Units (not administered)  methylPREDNISolone acetate (DEPO-MEDROL) injection 80 mg (not administered)  ibuprofen (ADVIL,MOTRIN) tablet 800 mg (800 mg Oral Given 03/22/16 1031)    BP 144/97   Temp 102.3 F (39.1 C) (Oral)   Resp 18   LMP 03/12/2016   SpO2 100%  No data found.   Physical Exam NURSES NOTES AND VITAL SIGNS REVIEWED. CONSTITUTIONAL: Well developed, well nourished, no acute distress HEENT: normocephalic, atraumatic EYES: Conjunctiva normal NECK:normal ROM, supple, no adenopathy PULMONARY:No respiratory distress, normal effort ABDOMINAL: Soft, ND, NT BS+, No CVAT MUSCULOSKELETAL: Normal ROM of all extremities,  SKIN: warm and dry without rash PSYCHIATRIC: Mood and affect, behavior are normal  Urgent Care Course   Clinical Course    Procedures (including critical care time)  Labs Review Labs Reviewed  POCT RAPID STREP A - Abnormal; Notable for the following:       Result Value   Streptococcus, Group A Screen (Direct) POSITIVE (*)    All other components within normal limits    Imaging Review No results found.   Visual Acuity Review  Right Eye Distance:   Left Eye Distance:   Bilateral Distance:  Right Eye Near:   Left Eye Near:    Bilateral Near:       Prescription for prednisone short burst is provided. Patient elects to have injection of Bicillin LA. There are no signs or symptoms that appeared to be sepsis no hospitalization is required. Patient should have full recovery at home. MDM  No diagnosis found.  Patient  is reassured that there are no issues that require transfer to higher level of care at this time or additional tests. Patient is advised to continue home symptomatic treatment. Patient is advised that if there are new or worsening symptoms to attend the emergency department, contact primary care provider, or return to UC. Instructions of care provided discharged home in stable condition.    THIS NOTE WAS GENERATED USING A VOICE RECOGNITION SOFTWARE PROGRAM. ALL REASONABLE EFFORTS  WERE MADE TO PROOFREAD THIS DOCUMENT FOR ACCURACY.  I have verbally reviewed the discharge instructions with the patient. A printed AVS was given to the patient.  All questions were answered prior to discharge.      Tharon Aquas, PA 03/22/16 Rickey Primus

## 2016-03-22 NOTE — ED Triage Notes (Signed)
The patient presented to the [facility] with a complaint of a sore throat and fever for 2 days.

## 2016-03-27 ENCOUNTER — Encounter: Payer: Self-pay | Admitting: Infectious Disease

## 2016-05-11 ENCOUNTER — Other Ambulatory Visit: Payer: Self-pay | Admitting: Infectious Disease

## 2016-05-17 ENCOUNTER — Ambulatory Visit (INDEPENDENT_AMBULATORY_CARE_PROVIDER_SITE_OTHER): Payer: Self-pay

## 2016-05-17 DIAGNOSIS — Z23 Encounter for immunization: Secondary | ICD-10-CM

## 2016-05-24 ENCOUNTER — Ambulatory Visit: Payer: Self-pay

## 2016-06-05 ENCOUNTER — Telehealth: Payer: Self-pay | Admitting: *Deleted

## 2016-06-05 NOTE — Telephone Encounter (Signed)
I would try OTC delsym for starters. We will not be prescribing opioids

## 2016-06-05 NOTE — Telephone Encounter (Signed)
Cough, Chills, requesting prescription for cough rx.  Attempted to call the patient to find out if she has tried OTC cough preparations and to offer a "acute"' appointment.  Unable to reach the patient, voicemail not set up on the phone.  RN will route phone message to Dr. Daiva EvesVan Dam for advice.

## 2016-06-05 NOTE — Telephone Encounter (Signed)
Patient left message in triage. She has an uncontrollable cough, is asking for something to be called in to the pharmacy for her. Patient has RW/ADAP. RN returned the call to offer patient an appointment. No answer, voicemail not yet set up. Andree CossHowell, Ingra Rother M, RN

## 2016-06-06 NOTE — Telephone Encounter (Signed)
Patient called back and advised that Dr. Clinton GallantVan Dam's next available appt is in November. She will try OTC Musinex and advised to drink plenty of water and if she worsens or develops a fever to go to Urgent Care. [facility] and advised to call the 1rst of the month to establish care for PCP. She agreed to this plan. [name] '

## 2016-08-16 ENCOUNTER — Other Ambulatory Visit (INDEPENDENT_AMBULATORY_CARE_PROVIDER_SITE_OTHER): Payer: Self-pay

## 2016-08-16 DIAGNOSIS — B20 Human immunodeficiency virus [HIV] disease: Secondary | ICD-10-CM

## 2016-08-16 DIAGNOSIS — Z113 Encounter for screening for infections with a predominantly sexual mode of transmission: Secondary | ICD-10-CM

## 2016-08-16 LAB — CBC WITH DIFFERENTIAL/PLATELET
Basophils Absolute: 0 cells/uL (ref 0–200)
Basophils Relative: 0 %
EOS PCT: 2 %
Eosinophils Absolute: 190 cells/uL (ref 15–500)
HCT: 36.6 % (ref 35.0–45.0)
Hemoglobin: 11.9 g/dL (ref 11.7–15.5)
LYMPHS PCT: 23 %
Lymphs Abs: 2185 cells/uL (ref 850–3900)
MCH: 27.4 pg (ref 27.0–33.0)
MCHC: 32.5 g/dL (ref 32.0–36.0)
MCV: 84.1 fL (ref 80.0–100.0)
MPV: 10 fL (ref 7.5–12.5)
Monocytes Absolute: 855 cells/uL (ref 200–950)
Monocytes Relative: 9 %
NEUTROS PCT: 66 %
Neutro Abs: 6270 cells/uL (ref 1500–7800)
Platelets: 274 10*3/uL (ref 140–400)
RBC: 4.35 MIL/uL (ref 3.80–5.10)
RDW: 15.9 % — AB (ref 11.0–15.0)
WBC: 9.5 10*3/uL (ref 3.8–10.8)

## 2016-08-16 LAB — COMPLETE METABOLIC PANEL WITH GFR
ALBUMIN: 3.6 g/dL (ref 3.6–5.1)
ALK PHOS: 59 U/L (ref 33–115)
ALT: 18 U/L (ref 6–29)
AST: 14 U/L (ref 10–35)
BUN: 13 mg/dL (ref 7–25)
CALCIUM: 8.6 mg/dL (ref 8.6–10.2)
CO2: 21 mmol/L (ref 20–31)
CREATININE: 0.83 mg/dL (ref 0.50–1.10)
Chloride: 107 mmol/L (ref 98–110)
GFR, Est African American: >89 mL/min (ref >=60)
GFR, Est Non African American: 85 mL/min (ref >=60)
Glucose, Bld: 91 mg/dL (ref 65–99)
Potassium: 3.8 mmol/L (ref 3.5–5.3)
Sodium: 137 mmol/L (ref 135–146)
Total Bilirubin: 0.3 mg/dL (ref 0.2–1.2)
Total Protein: 7 g/dL (ref 6.1–8.1)

## 2016-08-17 LAB — T-HELPER CELL (CD4) - (RCID CLINIC ONLY)
CD4 T CELL ABS: 850 /uL (ref 400–2700)
CD4 T CELL HELPER: 36 % (ref 33–55)

## 2016-08-17 LAB — URINE CYTOLOGY ANCILLARY ONLY
Chlamydia: NEGATIVE
Neisseria Gonorrhea: NEGATIVE

## 2016-08-17 LAB — RPR

## 2016-08-21 LAB — HIV-1 RNA QUANT-NO REFLEX-BLD
HIV 1 RNA Quant: 265 copies/mL — ABNORMAL HIGH (ref <20)
HIV-1 RNA Quant, Log: 2.42 Log copies/mL — ABNORMAL HIGH (ref <1.30)

## 2016-09-03 ENCOUNTER — Ambulatory Visit: Payer: Self-pay

## 2016-09-03 ENCOUNTER — Encounter: Payer: Self-pay | Admitting: Infectious Disease

## 2016-09-03 ENCOUNTER — Ambulatory Visit (INDEPENDENT_AMBULATORY_CARE_PROVIDER_SITE_OTHER): Payer: Self-pay | Admitting: Infectious Disease

## 2016-09-03 VITALS — BP 154/96 | HR 80 | Temp 97.7°F | Wt 228.0 lb

## 2016-09-03 DIAGNOSIS — R110 Nausea: Secondary | ICD-10-CM

## 2016-09-03 DIAGNOSIS — E663 Overweight: Secondary | ICD-10-CM

## 2016-09-03 DIAGNOSIS — Z9119 Patient's noncompliance with other medical treatment and regimen: Secondary | ICD-10-CM

## 2016-09-03 DIAGNOSIS — B20 Human immunodeficiency virus [HIV] disease: Secondary | ICD-10-CM

## 2016-09-03 DIAGNOSIS — I10 Essential (primary) hypertension: Secondary | ICD-10-CM

## 2016-09-03 DIAGNOSIS — Z91199 Patient's noncompliance with other medical treatment and regimen due to unspecified reason: Secondary | ICD-10-CM

## 2016-09-03 MED ORDER — AMLODIPINE BESYLATE 10 MG PO TABS: ORAL_TABLET | ORAL | 11 refills | Status: DC

## 2016-09-03 MED ORDER — HYDROCHLOROTHIAZIDE 25 MG PO TABS: 25.0000 mg | ORAL_TABLET | Freq: Every day | ORAL | 11 refills | Status: DC

## 2016-09-03 NOTE — Progress Notes (Signed)
Chief complaint is followup for HIV on meds, complaining of nausea when taking those medications. Subjective:    Patient ID: Monique [name], female    DOB: 1970-10-07, 46 y.o.   MRN: [id number]  HPI  46 year old African American lady with HIV AIDS ( August 2014) also with lung abscess . She was initially treated for both lung abscess and possibility of pulmonary tuberculosis. See prior notes for workup ultimately all data was negative for pulmonary tuberculosis and she did finish a course of therapy for latent tuberculosis.  She also had protracted Augmentin .   I  simplified her antiretroviral medications to [facility] and she had been withan undetectable viral load since healthy CD4 count. However at hervisit  In July 2015 her viral load was suddenly 28,124 copies. Her genotype showed no resistance and no increased resistance.We got her renewed on ADAP and she was again nicely suppressed ever since then.  She had been doing relatively well but her viral load has ticked up to 265 when checked in December on questioning her she admitted that she had missed at least 4 doses before her labs were taught taken and she may have missed even more since then. She states that the [facility] does make her feel a bit nauseous this does improve with time but it is a problem for her particular when she restarts the medications. She told me that she physically felt fine but I had to explain once again that HIV does not cause symptoms in total late in "the game" when it destroys the immune system. I also emphasized that HIV needed to be suppressed not only for own immune health but from a standpoint of preventing transmission to others.  I also emphasized the need to take her blood pressure medicines which she stated she did not take today. I told her that she will indeed take these medications religiously as well and it like HIV hypertension is also asymptomatic until there is a cadaveric dystrophic event such as a  stroke or heart attack or the development of end-stage renal disease.  Lab Results  Component Value Date   HIV1RNAQUANT 265 (H) 08/16/2016   HIV1RNAQUANT <20 02/29/2016   HIV1RNAQUANT <20 07/07/2015      Lab Results  Component Value Date   CD4TABS 850 08/16/2016   CD4TABS 580 02/29/2016   CD4TABS 530 07/07/2015     Past Medical History:  Diagnosis Date  . HIV (human immunodeficiency virus infection) (HCC)   . Hypertension   . 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 Lower extremity edema 01/12/2015  . Lung abscess (HCC)   . Overweight 01/12/2015  . Pneumonia   . TB (tuberculosis)   . Tonsillitis     Past Surgical History:  Procedure Laterality Date  . CESAREAN SECTION    . VIDEO BRONCHOSCOPY Bilateral 04/13/2013   Procedure: VIDEO BRONCHOSCOPY WITHOUT FLUORO;  Surgeon: Kalman Shan, MD;  Location: [facility] ENDOSCOPY;  Service: Cardiopulmonary;  Laterality: Bilateral;    Family History  Problem Relation Age of Onset  . Diabetes Sister   . Hypertension Sister       Social History   Social History  . Marital status: Single    Spouse name: N/A  . Number of children: N/A  . Years of education: N/A   Social History Main Topics  . Smoking status: Never Smoker  . Smokeless tobacco: Never Used  . Alcohol use No  . Drug use: No  . Sexual activity: Not  on file   Other Topics Concern  . Not on file   Social History Narrative  . No narrative on file    No Known Allergies   Current Outpatient Prescriptions:  .  Abacavir-Dolutegravir-Lamivud 600-50-300 MG TABS, Take 1 tablet by mouth daily., Disp: 30 tablet, Rfl: 11 .  amLODipine (NORVASC) 10 MG tablet, TAKE 1 TABLET(10 MG) BY MOUTH DAILY, Disp: 30 tablet, Rfl: 3 .  hydrochlorothiazide (HYDRODIURIL) 25 MG tablet, Take 1 tablet (25 mg total) by mouth daily., Disp: 30 tablet, Rfl: 11 .  predniSONE (DELTASONE) 10 MG tablet, Take 2 tablets (20 mg total) by mouth daily., Disp:  15 tablet, Rfl: 0 .  TRIUMEQ 600-50-300 MG tablet, TAKE 1 TABLET BY MOUTH DAILY, Disp: 30 tablet, Rfl: 5   Review of Systems  Constitutional: Negative for activity change, appetite change, chills, diaphoresis, fatigue and unexpected weight change.  HENT: Negative for facial swelling, rhinorrhea, sinus pressure, sneezing and trouble swallowing.   Eyes: Negative for photophobia and visual disturbance.  Respiratory: Negative for chest tightness and stridor.   Cardiovascular: Negative for palpitations and leg swelling.  Gastrointestinal: Negative for abdominal distention, anal bleeding, blood in stool and constipation.  Genitourinary: Negative for difficulty urinating, dysuria, flank pain and hematuria.  Musculoskeletal: Negative for arthralgias, back pain, gait problem, joint swelling and myalgias.  Skin: Negative for color change, pallor and wound.  Neurological: Negative for dizziness, tremors, weakness, light-headedness and headaches.  Hematological: Negative for adenopathy. Does not bruise/bleed easily.  Psychiatric/Behavioral: Negative for agitation, behavioral problems, confusion, decreased concentration, dysphoric mood and sleep disturbance.       Objective:   Physical Exam  Constitutional: She is oriented to person, place, and time. She appears well-developed and well-nourished. No distress.  HENT:  Head: Normocephalic and atraumatic.  Mouth/Throat: Oropharynx is clear and moist.  Eyes: Conjunctivae and EOM are normal. Pupils are equal, round, and reactive to light. No scleral icterus.  Neck: Normal range of motion. Neck supple.  Cardiovascular: Normal rate, regular rhythm and normal heart sounds.  Exam reveals no gallop and no friction rub.   No murmur heard. Pulmonary/Chest: Effort normal and breath sounds normal. No respiratory distress. She has no wheezes. She has no rales. She exhibits no tenderness.  Abdominal: Soft. She exhibits no distension.  Musculoskeletal: She exhibits  no edema or tenderness.  Neurological: She is alert and oriented to person, place, and time. She exhibits normal muscle tone. Coordination normal.  Skin: Skin is warm. She is not diaphoretic.  Psychiatric: She has a normal mood and affect. Her speech is slurred.       Assessment & Plan:   HIV/AIDS: -Continue TRIUMEQ For now she is going to restart it again and we will recheck labs in approximate 2 months. I think she would benefit to being off of the abacavir which I suspect is causing her nausea. I offered to change her to [facility] discovered that she liked spell one pill once a day. I will plan on placing her on the new single tablet regimen BFTAF when available  Hypertension: She needs to take both amlodipine, and diuretic and bring these medicines with her.  Vitals:   09/03/16 1006  BP: (!) 154/96  Pulse: 80  Temp: 97.7 F (36.5 C)   Nausea: Could be due to abacavir again would like to make that switch from abacavir to tenofovir see above discussion.  Overweight: try lowcarbohydrate diet and exercise  I spent greater than 25 minutes with the patient including greater than  50% of time in face to face counsel of the patient re her HIV, HTN, her nausea and obesity and in coordination of her care.

## 2016-09-06 ENCOUNTER — Encounter (HOSPITAL_COMMUNITY): Payer: Self-pay | Admitting: Emergency Medicine

## 2016-09-06 ENCOUNTER — Telehealth: Payer: Self-pay | Admitting: *Deleted

## 2016-09-06 ENCOUNTER — Ambulatory Visit (HOSPITAL_COMMUNITY)
Admission: EM | Admit: 2016-09-06 | Discharge: 2016-09-06 | Disposition: A | Discharge status: Home / Self Care | Payer: BLUE CROSS/BLUE SHIELD | Attending: Family Medicine | Admitting: Family Medicine

## 2016-09-06 DIAGNOSIS — J00 Acute nasopharyngitis [common cold]: Secondary | ICD-10-CM | POA: Diagnosis not present

## 2016-09-06 MED ORDER — IPRATROPIUM BROMIDE 0.06 % NA SOLN: 2.0000 | Freq: Four times a day (QID) | NASAL | 0 refills | Status: DC

## 2016-09-06 MED ORDER — AZITHROMYCIN 250 MG PO TABS: 250.0000 mg | ORAL_TABLET | Freq: Every day | ORAL | 0 refills | Status: DC

## 2016-09-06 NOTE — ED Provider Notes (Signed)
CSN: [id number]     Arrival date & time 09/06/16  1612 History   First MD Initiated Contact with Patient 09/06/16 1651     Chief Complaint  Patient presents with  . Sore Throat   (Consider location/radiation/quality/duration/timing/severity/associated sxs/prior Treatment) Patient c/o sore throat congestion and fever x 1 day    URI  Presenting symptoms: congestion, fatigue, fever and rhinorrhea   Severity:  Moderate Onset quality:  Sudden Duration:  1 day Timing:  Intermittent Progression:  Worsening Chronicity:  New Relieved by:  Nothing Worsened by:  Nothing Ineffective treatments:  None tried   Past Medical History:  Diagnosis Date  . HIV (human immunodeficiency virus infection) (HCC)   . Hypertension   . 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 Lower extremity edema 01/12/2015  . Lung abscess (HCC)   . Overweight 01/12/2015  . Pneumonia   . TB (tuberculosis)   . Tonsillitis    Past Surgical History:  Procedure Laterality Date  . CESAREAN SECTION    . VIDEO BRONCHOSCOPY Bilateral 04/13/2013   Procedure: VIDEO BRONCHOSCOPY WITHOUT FLUORO;  Surgeon: Kalman Shan, MD;  Location: [facility] ENDOSCOPY;  Service: Cardiopulmonary;  Laterality: Bilateral;   Family History  Problem Relation Age of Onset  . Diabetes Sister   . Hypertension Sister    Social History  Substance Use Topics  . Smoking status: Never Smoker  . Smokeless tobacco: Never Used  . Alcohol use No   OB History    No data available     Review of Systems  Constitutional: Positive for fatigue and fever.  HENT: Positive for congestion and rhinorrhea.   Eyes: Negative.   Respiratory: Negative.   Cardiovascular: Negative.   Gastrointestinal: Negative.   Endocrine: Negative.   Genitourinary: Negative.   Musculoskeletal: Negative.   Allergic/Immunologic: Negative.   Neurological: Negative.   Hematological: Negative.   Psychiatric/Behavioral: Negative.      Allergies  Patient has no known allergies.  Home Medications   Prior to Admission medications   Medication Sig Start Date End Date Taking? Authorizing Provider  Abacavir-Dolutegravir-Lamivud 600-50-300 MG TABS Take 1 tablet by mouth daily. 04/20/15   Randall Hiss, MD  amLODipine (NORVASC) 10 MG tablet TAKE 1 TABLET(10 MG) BY MOUTH DAILY 09/03/16   Randall Hiss, MD  azithromycin (ZITHROMAX) 250 MG tablet Take 1 tablet (250 mg total) by mouth daily. Take first 2 tablets together, then 1 every day until finished. 09/06/16   Deatra Canter, FNP  hydrochlorothiazide (HYDRODIURIL) 25 MG tablet Take 1 tablet (25 mg total) by mouth daily. 09/03/16   Randall Hiss, MD  ipratropium (ATROVENT) 0.06 % nasal spray Place 2 sprays into both nostrils 4 (four) times daily. 09/06/16   Deatra Canter, FNP  TRIUMEQ 600-50-300 MG tablet TAKE 1 TABLET BY MOUTH DAILY 03/19/16   Judyann Munson, MD   Meds Ordered and Administered this Visit  Medications - No data to display  BP 135/91 (BP Location: Left Arm)   Pulse 93   Temp 99.2 F (37.3 C) (Oral)   Resp 24   LMP 08/21/2016   SpO2 99%  No data found.   Physical Exam  Constitutional: She appears well-developed and well-nourished.  HENT:  Head: Normocephalic and atraumatic.  Right Ear: External ear normal.  Left Ear: External ear normal.  Mouth/Throat: Oropharynx is clear and moist.  Eyes: Conjunctivae and EOM are normal. Pupils are equal, round, and reactive to light.  Neck: Normal  range of motion. Neck supple.  Cardiovascular: Normal rate, regular rhythm and normal heart sounds.   Pulmonary/Chest: Effort normal and breath sounds normal.  Abdominal: Soft. Bowel sounds are normal.  Nursing note and vitals reviewed.   Urgent Care Course   Clinical Course     Procedures (including critical care time)  Labs Review Labs Reviewed - No data to display  Imaging Review No results found.   Visual Acuity Review  Right Eye  Distance:   Left Eye Distance:   Bilateral Distance:    Right Eye Near:   Left Eye Near:    Bilateral Near:         MDM   1. Acute nasopharyngitis    Ipratropium nasal spray 0.06% 2 sprays per nostril qid prn #2615ml Zpak as directed  Push po fluids, rest, tylenol and motrin otc prn as directed for fever, arthralgias, and myalgias.  Follow up prn if sx's continue or persist.    Deatra CanterWilliam J Oxford, FNP 09/06/16 1704

## 2016-09-06 NOTE — ED Triage Notes (Signed)
Sore throat, fever and chills

## 2016-09-06 NOTE — Telephone Encounter (Signed)
Patient left message in triage asking for Dr Daiva EvesVan Dam to call in a cough medication for her.  RN returned the call at 4:20 - patient is already at the Urgent Care for evaluation.  RN advised patient that her PCP or the Urgent Care would be appropriate, as she needs to be seen for evaluation. Andree CossHowell, Morris Markham M, RN

## 2016-09-10 ENCOUNTER — Encounter (HOSPITAL_COMMUNITY): Payer: Self-pay | Admitting: Emergency Medicine

## 2016-09-10 ENCOUNTER — Ambulatory Visit (HOSPITAL_COMMUNITY)
Admission: EM | Admit: 2016-09-10 | Discharge: 2016-09-10 | Disposition: A | Discharge status: Home / Self Care | Payer: BLUE CROSS/BLUE SHIELD | Attending: Emergency Medicine | Admitting: Emergency Medicine

## 2016-09-10 DIAGNOSIS — B370 Candidal stomatitis: Secondary | ICD-10-CM | POA: Diagnosis not present

## 2016-09-10 DIAGNOSIS — R21 Rash and other nonspecific skin eruption: Secondary | ICD-10-CM | POA: Diagnosis not present

## 2016-09-10 DIAGNOSIS — T7840XA Allergy, unspecified, initial encounter: Secondary | ICD-10-CM

## 2016-09-10 MED ORDER — FLUCONAZOLE 150 MG PO TABS: ORAL_TABLET | ORAL | 0 refills | Status: DC

## 2016-09-10 MED ORDER — NYSTATIN 100000 UNIT/ML MT SUSP: 500000.0000 [IU] | Freq: Four times a day (QID) | OROMUCOSAL | 0 refills | Status: DC

## 2016-09-10 MED ORDER — CLOTRIMAZOLE-BETAMETHASONE 1-0.05 % EX CREA: TOPICAL_CREAM | CUTANEOUS | 0 refills | Status: DC

## 2016-09-10 NOTE — ED Triage Notes (Addendum)
Sore throat continues-but little better.  Now has a generalized, itchy rash.  Finished antibiotic Sunday and noticed rash on sunday

## 2016-09-10 NOTE — ED Notes (Signed)
Spoke to Tesoro Corporationdavid mabe, np about work note, prepared as instructed by [employer], np

## 2016-09-10 NOTE — ED Provider Notes (Signed)
CSN: [id number]     Arrival date & time 09/10/16  1313 History   First MD Initiated Contact with Patient 09/10/16 1456     Chief Complaint  Patient presents with  . Rash   (Consider location/radiation/quality/duration/timing/severity/associated sxs/prior Treatment) [age] year old female with HIV states she was seen in this urgent care approximately 4 days or so ago with sore throat and treated with azithromycin. Yesterday she developed a rash across the entire abdomen radiating toward the flanks. It is an itchy rash. Denies fever or chills. She states she still feels like her throat is sore and she complains of PND as well. Not taking medications for that. Denies fever or chills.      Past Medical History:  Diagnosis Date  . HIV (human immunodeficiency virus infection) (HCC)   . Hypertension   . 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 Lower extremity edema 01/12/2015  . Lung abscess (HCC)   . Overweight 01/12/2015  . Pneumonia   . TB (tuberculosis)   . Tonsillitis    Past Surgical History:  Procedure Laterality Date  . CESAREAN SECTION    . VIDEO BRONCHOSCOPY Bilateral 04/13/2013   Procedure: VIDEO BRONCHOSCOPY WITHOUT FLUORO;  Surgeon: Kalman ShanMurali Ramaswamy, MD;  [facility];  Service: Cardiopulmonary;  Laterality: Bilateral;   Family History  Problem Relation Age of Onset  . Diabetes Sister   . Hypertension Sister    Social History  Substance Use Topics  . Smoking status: Never Smoker  . Smokeless tobacco: Never Used  . Alcohol use No   OB History    No data available     Review of Systems  Constitutional: Negative.  Negative for fever.  HENT: Positive for postnasal drip and sore throat. Negative for ear pain.   Respiratory: Negative.   Cardiovascular: Negative for chest pain.  Gastrointestinal: Negative.   Skin: Positive for rash.  Neurological: Negative.   All other systems reviewed and are negative.   Allergies   Patient has no known allergies.  Home Medications   Prior to Admission medications   Medication Sig Start Date End Date Taking? Authorizing Provider  Abacavir-Dolutegravir-Lamivud 600-50-300 MG TABS Take 1 tablet by mouth daily. 04/20/15   Randall Hissornelius N Van Dam, MD  amLODipine (NORVASC) 10 MG tablet TAKE 1 TABLET(10 MG) BY MOUTH DAILY 09/03/16   Randall Hissornelius N Van Dam, MD  clotrimazole-betamethasone (LOTRISONE) cream Apply to affected area 2 times daily prn 09/10/16   Hayden Rasmussenavid Starlee Corralejo, NP  hydrochlorothiazide (HYDRODIURIL) 25 MG tablet Take 1 tablet (25 mg total) by mouth daily. 09/03/16   Randall Hissornelius N Van Dam, MD  ipratropium (ATROVENT) 0.06 % nasal spray Place 2 sprays into both nostrils 4 (four) times daily. 09/06/16   Deatra CanterWilliam J Oxford, FNP  nystatin (MYCOSTATIN) 100000 UNIT/ML suspension Take 5 mLs (500,000 Units total) by mouth 4 (four) times daily. 09/10/16   Hayden Rasmussenavid Kaylyn Garrow, NP  TRIUMEQ 600-50-300 MG tablet TAKE 1 TABLET BY MOUTH DAILY 03/19/16   Judyann Munsonynthia Snider, MD   Meds Ordered and Administered this Visit  Medications - No data to display  BP 130/75 (BP Location: Left Arm)   Pulse 98   Temp 98.2 F (36.8 C) (Oral)   Resp 20   LMP 08/21/2016   SpO2 98%  No data found.   Physical Exam  Constitutional: She is oriented to person, place, and time. She appears well-developed and well-nourished. No distress.  HENT:  Head: Normocephalic and atraumatic.  Oropharynx is deeply erythematous with bright white  streaky patches that extend into the posterior pharynx.    Eyes: EOM are normal.  Neck: Normal range of motion. Neck supple.  Cardiovascular: Normal rate.   Pulmonary/Chest: Effort normal.  Musculoskeletal: Normal range of motion. She exhibits no edema.  Lymphadenopathy:    She has no cervical adenopathy.  Neurological: She is alert and oriented to person, place, and time.  Skin: Skin is warm and dry. Capillary refill takes less than 2 seconds. Rash noted.  Combination rash in which there or  several raised erythematous papules, several sandpaperlike lesions, macular papular lesions extending across the entire abdomen the bilateral flanks. No drainage, no purulence or signs of infection.  Psychiatric: She has a normal mood and affect.  Nursing note and vitals reviewed.   Urgent Care Course   Clinical Course     Procedures (including critical care time)  Labs Review Labs Reviewed - No data to display  Imaging Review No results found.   Visual Acuity Review  Right Eye Distance:   Left Eye Distance:   Bilateral Distance:    Right Eye Near:   Left Eye Near:    Bilateral Near:         MDM   1. Rash and nonspecific skin eruption   2. Allergic reaction, initial encounter   3. Thrush, oral    Apply the cream to your abdomen twice a day. Take the nystatin suspension by mouth 4 times a day, gargle and swallow. Follow-up with primary care doctor as needed. Do not take any more easily azithromycin although I am not certain that this has anything to do with the rash. Meds ordered this encounter  Medications  . clotrimazole-betamethasone (LOTRISONE) cream    Sig: Apply to affected area 2 times daily prn    Dispense:  45 g    Refill:  0    Order Specific Question:   Supervising Provider    Answer:   Charm Rings [id number]  . nystatin (MYCOSTATIN) 100000 UNIT/ML suspension    Sig: Take 5 mLs (500,000 Units total) by mouth 4 (four) times daily.    Dispense:  120 mL    Refill:  0    Order Specific Question:   Supervising Provider    Answer:   Charm Rings [[id number]]   Diflucan 150 daily for 7 d.    Hayden Rasmussen, NP 09/10/16 1324    Hayden Rasmussen, NP 09/10/16 [phone]

## 2016-09-10 NOTE — Discharge Instructions (Signed)
Apply the cream to your abdomen twice a day. Diflucan tabs daily. Take the nystatin suspension by mouth 4 times a day, gargle and swallow. Follow-up with primary care doctor as needed. Do not take any more easily azithromycin although I am not certain that this has anything to do with the rash.

## 2016-09-20 ENCOUNTER — Encounter: Payer: Self-pay | Admitting: Infectious Disease

## 2016-09-29 ENCOUNTER — Other Ambulatory Visit: Payer: Self-pay | Admitting: Infectious Disease

## 2016-10-22 ENCOUNTER — Encounter (HOSPITAL_COMMUNITY): Payer: Self-pay | Admitting: Emergency Medicine

## 2016-10-22 ENCOUNTER — Telehealth: Payer: Self-pay | Admitting: *Deleted

## 2016-10-22 ENCOUNTER — Ambulatory Visit (HOSPITAL_COMMUNITY)
Admission: EM | Admit: 2016-10-22 | Discharge: 2016-10-22 | Disposition: A | Discharge status: Home / Self Care | Payer: BLUE CROSS/BLUE SHIELD | Attending: Family Medicine | Admitting: Family Medicine

## 2016-10-22 ENCOUNTER — Other Ambulatory Visit: Payer: BLUE CROSS/BLUE SHIELD

## 2016-10-22 DIAGNOSIS — J020 Streptococcal pharyngitis: Secondary | ICD-10-CM

## 2016-10-22 DIAGNOSIS — B20 Human immunodeficiency virus [HIV] disease: Secondary | ICD-10-CM

## 2016-10-22 LAB — COMPLETE METABOLIC PANEL WITH GFR
ALBUMIN: 3.7 g/dL (ref 3.6–5.1)
ALK PHOS: 65 U/L (ref 33–115)
ALT: 13 U/L (ref 6–29)
AST: 12 U/L (ref 10–35)
BUN: 11 mg/dL (ref 7–25)
CO2: 20 mmol/L (ref 20–31)
CREATININE: 0.68 mg/dL (ref 0.50–1.10)
Calcium: 8.6 mg/dL (ref 8.6–10.2)
Chloride: 107 mmol/L (ref 98–110)
GFR, Est African American: >89 mL/min (ref >=60)
GFR, Est Non African American: >89 mL/min (ref >=60)
GLUCOSE: 82 mg/dL (ref 65–99)
Potassium: 4.1 mmol/L (ref 3.5–5.3)
SODIUM: 133 mmol/L — AB (ref 135–146)
Total Bilirubin: 0.4 mg/dL (ref 0.2–1.2)
Total Protein: 7.1 g/dL (ref 6.1–8.1)

## 2016-10-22 LAB — CBC WITH DIFFERENTIAL/PLATELET
BASOS PCT: 0 %
Basophils Absolute: 0 cells/uL (ref 0–200)
EOS PCT: 0 %
Eosinophils Absolute: 0 cells/uL — ABNORMAL LOW (ref 15–500)
HCT: 35.4 % (ref 35.0–45.0)
HEMOGLOBIN: 11.7 g/dL (ref 11.7–15.5)
LYMPHS ABS: 2682 {cells}/uL (ref 850–3900)
Lymphocytes Relative: 18 %
MCH: 27.6 pg (ref 27.0–33.0)
MCHC: 33.1 g/dL (ref 32.0–36.0)
MCV: 83.5 fL (ref 80.0–100.0)
MONO ABS: 1490 {cells}/uL — AB (ref 200–950)
MPV: 10 fL (ref 7.5–12.5)
Monocytes Relative: 10 %
NEUTROS ABS: 10728 {cells}/uL — AB (ref 1500–7800)
Neutrophils Relative %: 72 %
Platelets: 261 10*3/uL (ref 140–400)
RBC: 4.24 MIL/uL (ref 3.80–5.10)
RDW: 17.9 % — ABNORMAL HIGH (ref 11.0–15.0)
WBC: 14.9 10*3/uL — AB (ref 3.8–10.8)

## 2016-10-22 LAB — POCT RAPID STREP A: Streptococcus, Group A Screen (Direct): POSITIVE — AB

## 2016-10-22 MED ORDER — LIDOCAINE VISCOUS 2 % MT SOLN: 20.0000 mL | OROMUCOSAL | 0 refills | Status: DC | PRN

## 2016-10-22 MED ORDER — AMOXICILLIN 875 MG PO TABS: 875.0000 mg | ORAL_TABLET | Freq: Two times a day (BID) | ORAL | 0 refills | Status: DC

## 2016-10-22 NOTE — Telephone Encounter (Signed)
Walk-In to [facility], Chills, throat sore, aching x 1 day.  [facility]  Patient does have [facility].  Patient advised to go to Urgent Care for evaluation.  She did have Flu Vaccine this past fall.

## 2016-10-22 NOTE — ED Provider Notes (Signed)
CSN: [id number]     Arrival date & time 10/22/16  1001 History   First MD Initiated Contact with Patient 10/22/16 1024     Chief Complaint  Patient presents with  . Sore Throat   (Consider location/radiation/quality/duration/timing/severity/associated sxs/prior Treatment) Patient c/o fever and sore throat starting yesterday.   The history is provided by the patient.  Sore Throat  This is a new problem. The current episode started yesterday. The problem occurs constantly. The problem has not changed since onset.Nothing aggravates the symptoms.    Past Medical History:  Diagnosis Date  . HIV (human immunodeficiency virus infection) (HCC)   . Hypertension   . 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 Lower extremity edema 01/12/2015  . Lung abscess (HCC)   . Overweight 01/12/2015  . Pneumonia   . TB (tuberculosis)   . Tonsillitis    Past Surgical History:  Procedure Laterality Date  . CESAREAN SECTION    . VIDEO BRONCHOSCOPY Bilateral 04/13/2013   Procedure: VIDEO BRONCHOSCOPY WITHOUT FLUORO;  Surgeon: Kalman ShanMurali Ramaswamy, MD;  [facility];  Service: Cardiopulmonary;  Laterality: Bilateral;   Family History  Problem Relation Age of Onset  . Diabetes Sister   . Hypertension Sister    Social History  Substance Use Topics  . Smoking status: Never Smoker  . Smokeless tobacco: Never Used  . Alcohol use No   OB History    No data available     Review of Systems  Constitutional: Positive for fatigue.  HENT: Positive for sore throat.   Eyes: Negative.   Respiratory: Negative.   Cardiovascular: Negative.   Gastrointestinal: Negative.   Endocrine: Negative.   Genitourinary: Negative.   Musculoskeletal: Negative.   Allergic/Immunologic: Negative.   Neurological: Negative.   Hematological: Negative.   Psychiatric/Behavioral: Negative.     Allergies  Patient has no known allergies.  Home Medications   Prior to Admission  medications   Medication Sig Start Date End Date Taking? Authorizing Provider  amLODipine (NORVASC) 10 MG tablet TAKE 1 TABLET(10 MG) BY MOUTH DAILY 09/03/16  Yes Randall Hissornelius N Van Dam, MD  hydrochlorothiazide (HYDRODIURIL) 25 MG tablet Take 1 tablet (25 mg total) by mouth daily. 09/03/16  Yes Randall Hissornelius N Van Dam, MD  TRIUMEQ 600-50-300 MG tablet TAKE 1 TABLET BY MOUTH DAILY 03/19/16  Yes Judyann Munsonynthia Snider, MD  amoxicillin (AMOXIL) 875 MG tablet Take 1 tablet (875 mg total) by mouth 2 (two) times daily. 10/22/16   Deatra CanterWilliam J Oxford, FNP  lidocaine (XYLOCAINE) 2 % solution Use as directed 20 mLs in the mouth or throat as needed for mouth pain. 10/22/16   Deatra CanterWilliam J Oxford, FNP   Meds Ordered and Administered this Visit  Medications - No data to display  BP 128/89 (BP Location: Right Arm)   Pulse 95   Temp 99.2 F (37.3 C) (Oral)   LMP 09/21/2016 (Exact Date)   SpO2 97%  No data found.   Physical Exam  Constitutional: She appears well-developed and well-nourished.  HENT:  Head: Normocephalic and atraumatic.  Right Ear: External ear normal.  Left Ear: External ear normal.  OPX erythematous and injected  Eyes: Conjunctivae and EOM are normal. Pupils are equal, round, and reactive to light.  Neck: Normal range of motion. Neck supple.  Cardiovascular: Normal rate, regular rhythm and normal heart sounds.   Pulmonary/Chest: Effort normal and breath sounds normal.  Abdominal: Soft. Bowel sounds are normal.  Musculoskeletal: Normal range of motion.  Nursing note and  vitals reviewed.   Urgent Care Course     Procedures (including critical care time)  Labs Review Labs Reviewed  POCT RAPID STREP A - Abnormal; Notable for the following:       Result Value   Streptococcus, Group A Screen (Direct) POSITIVE (*)    All other components within normal limits    Imaging Review No results found.   Visual Acuity Review  Right Eye Distance:   Left Eye Distance:   Bilateral Distance:    Right Eye  Near:   Left Eye Near:    Bilateral Near:         MDM   1. Strep throat    Amoxicillin 875mg  one po bid x 7 days #14 Lidocaine viscous 20ml po qid prn #113ml  Push po fluids, rest, tylenol and motrin otc prn as directed for fever, arthralgias, and myalgias.  Follow up prn if sx's continue or persist.    Deatra Canter, FNP 10/22/16 1114

## 2016-10-22 NOTE — ED Triage Notes (Signed)
Pt stated sore throat with aching and chills started yesterday morning.

## 2016-10-23 ENCOUNTER — Encounter (HOSPITAL_COMMUNITY): Payer: Self-pay | Admitting: Emergency Medicine

## 2016-10-23 ENCOUNTER — Emergency Department (HOSPITAL_COMMUNITY)
Admission: EM | Admit: 2016-10-23 | Discharge: 2016-10-23 | Disposition: A | Discharge status: Home / Self Care | Payer: BLUE CROSS/BLUE SHIELD | Attending: Emergency Medicine | Admitting: Emergency Medicine

## 2016-10-23 DIAGNOSIS — Z79899 Other long term (current) drug therapy: Secondary | ICD-10-CM | POA: Insufficient documentation

## 2016-10-23 DIAGNOSIS — J029 Acute pharyngitis, unspecified: Secondary | ICD-10-CM | POA: Insufficient documentation

## 2016-10-23 DIAGNOSIS — I10 Essential (primary) hypertension: Secondary | ICD-10-CM | POA: Diagnosis not present

## 2016-10-23 DIAGNOSIS — H9202 Otalgia, left ear: Secondary | ICD-10-CM | POA: Insufficient documentation

## 2016-10-23 LAB — MICROALBUMIN / CREATININE URINE RATIO
Creatinine, Urine: 73 mg/dL (ref 20–320)
Microalb Creat Ratio: 11 mcg/mg creat (ref <30)
Microalb, Ur: 0.8 mg/dL

## 2016-10-23 LAB — RPR

## 2016-10-23 NOTE — Discharge Instructions (Signed)
Continue taking your amoxicillin and completely finish course. Use Tylenol or ibuprofen as needed for pain or fevers.  Drink plenty of fluids throughout the day. Use your Viscous Lidocaine as needed.  Please follow-up with your primary care provider regarding today's visit in 1 week as needed.  Contact a health care provider if: The glands in your neck continue to get bigger. You develop a rash, cough, or earache. You cough up a thick liquid that is green, yellow-brown, or bloody. You have pain or discomfort that does not get better with medicine. Your problems seem to be getting worse rather than better. You have a fever. Get help right away if: You have new symptoms, such as vomiting, severe headache, stiff or painful neck, chest pain, or shortness of breath. You have severe throat pain, drooling, or changes in your voice. You have swelling of the neck, or the skin on the neck becomes red and tender. You have signs of dehydration, such as fatigue, dry mouth, and decreased urination. You become increasingly sleepy, or you cannot wake up completely. Your joints become red or painful.

## 2016-10-23 NOTE — ED Provider Notes (Signed)
[facility] Provider Note   CSN: [id number] Arrival date & time: 10/23/16  1331   By signing my name below, I, Monique [name], attest that this documentation has been prepared under the direction and in the presence of Monique [name], [place]. Electronically Signed: Freida Busmaniana [name], Scribe. 10/23/2016. 2:52 PM.  History   Chief Complaint Chief Complaint  Patient presents with  . Otalgia  . Sore Throat    The history is provided by the patient and medical records. No language interpreter was used.    HPI Comments:  Monique [name] is a 46 y.o. female with a history of HIV, who presents to the Emergency Department complaining of worsening left ear pain since last night. Pt reports associated sore throat x 3 days. She went to Urgent Care yesterday for the sore throat. She was diagnosed with strep throat and discharged home with amoxicillin and lidocaine. She states her pain on right side of her throat has greatly improved but left is still painful and the pain radiates up to left ear. Pt denies fever, CP, SOB, nausea, vomiting, and diarrhea. She denies hearing loss. No sick contacts at home. CD4 count greater than 500 as of October 2017.  She has been taking all of her medications as prescribed.    Past Medical History:  Diagnosis Date  . HIV (human immunodeficiency virus infection) (HCC)   . Hypertension   . 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 Lower extremity edema 01/12/2015  . Lung abscess (HCC)   . Overweight 01/12/2015  . Pneumonia   . TB (tuberculosis)   . Tonsillitis     Patient Active Problem List   Diagnosis Date Noted  . Obese 08/10/2015  . Essential hypertension 01/12/2015  . Lower extremity edema 01/12/2015  . Overweight 01/12/2015  . 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 DOE (dyspnea on exertion) 09/21/2014  . AIDS (HCC) 09/21/2014  . Ganglion cyst of flexor tendon  sheath of finger of left hand 08/17/2014  . Normocytic anemia 05/24/2013  . Human immunodeficiency virus (HIV) disease (HCC) 04/10/2013  . Acute respiratory failure with hypoxia (HCC) 04/10/2013  . Dehydration with hyponatremia 04/10/2013  . Sepsis (HCC) 04/09/2013  . Hypokalemia 04/09/2013  . Hyponatremia 04/09/2013  . Acute kidney injury (HCC) 04/09/2013    Past Surgical History:  Procedure Laterality Date  . CESAREAN SECTION    . VIDEO BRONCHOSCOPY Bilateral 04/13/2013   Procedure: VIDEO BRONCHOSCOPY WITHOUT FLUORO;  Surgeon: Kalman ShanMurali Ramaswamy, MD;  [facility];  Service: Cardiopulmonary;  Laterality: Bilateral;    OB History    No data available       Home Medications    Prior to Admission medications   Medication Sig Start Date End Date Taking? Authorizing Provider  amLODipine (NORVASC) 10 MG tablet TAKE 1 TABLET(10 MG) BY MOUTH DAILY 09/03/16   Randall Hissornelius N Van Dam, MD  amoxicillin (AMOXIL) 875 MG tablet Take 1 tablet (875 mg total) by mouth 2 (two) times daily. 10/22/16   Deatra CanterWilliam J Oxford, FNP  hydrochlorothiazide (HYDRODIURIL) 25 MG tablet Take 1 tablet (25 mg total) by mouth daily. 09/03/16   Randall Hissornelius N Van Dam, MD  lidocaine (XYLOCAINE) 2 % solution Use as directed 20 mLs in the mouth or throat as needed for mouth pain. 10/22/16   Deatra CanterWilliam J Oxford, FNP  TRIUMEQ 600-50-300 MG tablet TAKE 1 TABLET BY MOUTH DAILY 03/19/16   Judyann Munsonynthia Snider, MD    Family History Family  History  Problem Relation Age of Onset  . Diabetes Sister   . Hypertension Sister     Social History Social History  Substance Use Topics  . Smoking status: Never Smoker  . Smokeless tobacco: Never Used  . Alcohol use No     Allergies   Patient has no known allergies.   Review of Systems Review of Systems  Constitutional: Negative for chills and fever.  HENT: Positive for ear pain and sore throat. Negative for ear discharge.   Respiratory: Negative for shortness of breath.     Cardiovascular: Negative for chest pain.  Gastrointestinal: Negative for diarrhea, nausea and vomiting.     Physical Exam Updated Vital Signs BP 135/93 (BP Location: Right Arm)   Pulse 103   Temp 98.9 F (37.2 C) (Oral)   Resp 16   Ht 5\' 2"  (1.575 m)   Wt 104.3 kg   LMP 09/25/2016 (Approximate)   SpO2 100%   BMI 42.07 kg/m   Physical Exam  Constitutional: She is oriented to person, place, and time. She appears well-developed and well-nourished. No distress.  Well appearing  HENT:  Head: Normocephalic and atraumatic.  Right Ear: Hearing, tympanic membrane and external ear normal.  Left Ear: Hearing, tympanic membrane and external ear normal.  Nose: Nose normal.  Mouth/Throat: Oropharyngeal exudate, posterior oropharyngeal edema and posterior oropharyngeal erythema present.   Redness, swelling, and exudates   Eyes: Conjunctivae and EOM are normal. Pupils are equal, round, and reactive to light.  Neck: Normal range of motion.  Normal ROM. No nuchal rigidity.   Cardiovascular: Normal rate, regular rhythm and normal heart sounds.   Pulmonary/Chest: Effort normal and breath sounds normal. No respiratory distress. She has no wheezes. She has no rales.  Lungs CTA. No wheezing. No rales. No stridor. Normal work of breathing  Abdominal: Soft. She exhibits no distension. There is no tenderness. There is no rebound and no guarding.  Soft and nontender. No rebound. No guarding. Negative murphy's sign. No focal tenderness at McBurney's point. No CVA tenderness. No evidence of hernia  Lymphadenopathy:    She has cervical adenopathy.  Neurological: She is alert and oriented to person, place, and time.  Skin: Skin is warm and dry.  Psychiatric: She has a normal mood and affect. Her behavior is normal.  Nursing note and vitals reviewed.    ED Treatments / Results  DIAGNOSTIC STUDIES:  Oxygen Saturation is 100% on RA, normal by my interpretation.    COORDINATION OF CARE:  2:50 PM  Discussed treatment plan with pt at bedside and pt agreed to plan.  Labs (all labs ordered are listed, but only abnormal results are displayed) Labs Reviewed - No data to display  EKG  EKG Interpretation None       Radiology No results found.  Procedures Procedures (including critical care time)  Medications Ordered in ED Medications - No data to display   Initial Impression / Assessment and Plan / ED Course  I have reviewed the triage vital signs and the nursing notes.  Pertinent labs & imaging results that were available during my care of the patient were reviewed by me and considered in my medical decision making (see chart for details).     Pt rapid strep test positive at Urgent Care on 10/22/2016. Pt is tolerating secretions. Pt with no difficulty breathing.  Presentation not concerning for peritonsillar abscess or spread of infection to deep spaces of the throat; patent airway. TMs are normal. Ear pain appears to  be radiation of pain from throat. Ears do no appear infectious.  Pt will be discharged with instructions to complete course of Amoxicillin prescribed by Urgent care yesterday.  Specific return precautions discussed. Recommended PCP follow up. Pt appears safe for discharge.    Final Clinical Impressions(s) / ED Diagnoses   Final diagnoses:  Sore throat  Left ear pain    New Prescriptions New Prescriptions   No medications on file   I personally performed the services described in this documentation, which was scribed in my presence. The recorded information has been reviewed and is accurate.    [address] [place], [place] 10/23/16 [address] [place], [place] 10/23/16 1506    Gerhard Munch, MD 10/23/16 2030

## 2016-10-23 NOTE — ED Triage Notes (Signed)
Pt from home with c/o worsening left ear pain starting today.  Pt was dx with strep throat yesterday at the UC.  Taking ABX as prescribed.  NAD, A&O.

## 2016-10-24 LAB — T-HELPER CELL (CD4) - (RCID CLINIC ONLY)
CD4 % Helper T Cell: 26 % — ABNORMAL LOW (ref 33–55)
CD4 T Cell Abs: 700 /uL (ref 400–2700)

## 2016-10-26 LAB — HIV RNA, RTPCR W/R GT (RTI, PI,INT)
HIV-1 RNA, QN PCR: 1.72 {Log_copies}/mL — AB
HIV-1 RNA, QN PCR: 53 {copies}/mL — AB

## 2016-10-31 ENCOUNTER — Other Ambulatory Visit: Payer: Self-pay | Admitting: Internal Medicine

## 2016-11-05 ENCOUNTER — Ambulatory Visit (INDEPENDENT_AMBULATORY_CARE_PROVIDER_SITE_OTHER): Payer: BLUE CROSS/BLUE SHIELD | Admitting: Infectious Disease

## 2016-11-05 ENCOUNTER — Encounter: Payer: Self-pay | Admitting: Infectious Disease

## 2016-11-05 VITALS — BP 130/86 | HR 87 | Temp 98.4°F | Ht 63.0 in | Wt 220.0 lb

## 2016-11-05 DIAGNOSIS — E663 Overweight: Secondary | ICD-10-CM | POA: Diagnosis not present

## 2016-11-05 DIAGNOSIS — I10 Essential (primary) hypertension: Secondary | ICD-10-CM

## 2016-11-05 DIAGNOSIS — B20 Human immunodeficiency virus [HIV] disease: Secondary | ICD-10-CM

## 2016-11-05 DIAGNOSIS — Z23 Encounter for immunization: Secondary | ICD-10-CM | POA: Diagnosis not present

## 2016-11-05 MED ORDER — ABACAVIR-DOLUTEGRAVIR-LAMIVUD 600-50-300 MG PO TABS: 1.0000 | ORAL_TABLET | Freq: Every day | ORAL | 5 refills | Status: DC

## 2016-11-05 NOTE — Progress Notes (Signed)
HPI: Monique [name] is a 46 y.o. female who presents to the [facility] clinic today to follow-up with Dr. Daiva EvesVan Dam for her HIV infection.   Allergies: No Known Allergies  Past Medical History: Past Medical History:  Diagnosis Date  . HIV (human immunodeficiency virus infection) (HCC)   . Hypertension   . 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 Lower extremity edema 01/12/2015  . Lung abscess (HCC)   . Overweight 01/12/2015  . Pneumonia   . TB (tuberculosis)   . Tonsillitis     Social History: Social History   Social History  . Marital status: Single    Spouse name: N/A  . Number of children: N/A  . Years of education: N/A   Social History Main Topics  . Smoking status: Never Smoker  . Smokeless tobacco: Never Used  . Alcohol use No  . Drug use: No  . Sexual activity: Not Asked   Other Topics Concern  . None   Social History Narrative  . None    Current Regimen: Triumeq  Labs: HIV 1 RNA Quant (copies/mL)  Date Value  08/16/2016 265 (H)  02/29/2016 <20  07/07/2015 <20   CD4 T Cell Abs (/uL)  Date Value  10/22/2016 700  08/16/2016 850  02/29/2016 580   Hepatitis B Surface Ag (no units)  Date Value  04/10/2013 NEGATIVE   HCV Ab (no units)  Date Value  04/10/2013 NEGATIVE    CrCl: Estimated Creatinine Clearance: 99 mL/min (by C-G formula based on SCr of 0.68 mg/dL).  Lipids:    Component Value Date/Time   CHOL 177 02/29/2016 1142   TRIG 136 02/29/2016 1142   HDL 36 (L) 02/29/2016 1142   CHOLHDL 4.9 02/29/2016 1142   VLDL 27 02/29/2016 1142   LDLCALC 114 02/29/2016 1142    Assessment: Monique [name] is here today for HIV follow-up.  She has been on Triumeq but states she gets significant nausea when taking it. She had interests in switching to [facility], but when I [facility] it [facility] to [facility] [place] (we could not fill because she has [employer]), they told me that [employer] does not have that on their formulary yet.   So I sent her Triumeq to [facility].  Will have her follow back up with me in 1 month to see if she can have Biktarvy at that point.    Plans: - Triumeq 1 tablet PO daily - Sent to [facility] - F/u with me 4/9 at 9am  Tiras Bianchini L. Akeira Lahm, PharmD, CPP Infectious Diseases Clinical Pharmacist [facility] 11/05/2016, 10:24 AM

## 2016-11-05 NOTE — Patient Instructions (Signed)
wE WILL SEE IF WE CAN CHANGE YOU TO BIKTARVY  IF SO I WOULD LIKE YOU TO MEET WITH Monique [name] IN NEXT MONTH FOR RECHECK OF YOUR LABS

## 2016-11-05 NOTE — Progress Notes (Signed)
Chief complaint is followup for HIV on meds, still complaining of nausea when taking those medications. Subjective:    Patient ID: Monique [name], female    DOB: Feb 20, 1971, 46 y.o.   MRN: [id number]  ++    46 year old African American lady with HIV AIDS ( August 2014) also with lung abscess . She was initially treated for both lung abscess and possibility of pulmonary tuberculosis. See prior notes for workup ultimately all data was negative for pulmonary tuberculosis and she did finish a course of therapy for latent tuberculosis.  She also had protracted Augmentin .   I  simplified her [facility]iretroviral medications to [facility] and she had been withan undetectable viral load since healthy CD4 count. However at hervisit  In July 2015 her viral load was suddenly 28,124 copies. Her genotype showed no resistance and no increased resistance.We got her renewed on ADAP and she was again nicely suppressed ever since then.  She had been doing relatively well but her viral load has ticked up to 265 when checked in December on questioning her she admitted that she had missed at least 4 doses before her labs were taught taken and she may have missed even more since then. She stated  that the [facility] does make her feel a bit nauseous this does improve with time but it is a problem for her particular when she restarts the medications. She told me that she physically felt fine but I had to explain once again that HIV does not cause symptoms in total late in "the game" when it destroys the immune system. I also emphasized that HIV needed to be suppressed not only for own immune health but from a standpoint of preventing transmission to others.  Her repeat VL came down to 53.   Lab Results  Component Value Date   HIV1RNAQUANT 265 (H) 08/16/2016   HIV1RNAQUANT <20 02/29/2016   HIV1RNAQUANT <20 07/07/2015      Lab Results  Component Value Date   CD4TABS 700 10/22/2016   CD4TABS 850 08/16/2016   CD4TABS  580 02/29/2016   I had wanted to switch her to [facility] today but her insurance that she has acquired will not cover so we are sending Rx to [facility] in [place] for TRIUMEQ until we can make this switch  Past Medical History:  Diagnosis Date  . HIV (human immunodeficiency virus infection) (HCC)   . Hypertension   . 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 Lower extremity edema 01/12/2015  . Lung abscess (HCC)   . Overweight 01/12/2015  . Pneumonia   . TB (tuberculosis)   . Tonsillitis     Past Surgical History:  Procedure Laterality Date  . CESAREAN SECTION    . VIDEO BRONCHOSCOPY Bilateral 04/13/2013   Procedure: VIDEO BRONCHOSCOPY WITHOUT FLUORO;  Surgeon: Kalman ShanMurali Ramaswamy, MD;  [facility];  Service: Cardiopulmonary;  Laterality: Bilateral;    Family History  Problem Relation Age of Onset  . Diabetes Sister   . Hypertension Sister       Social History   Social History  . Marital status: Single    Spouse name: N/A  . Number of children: N/A  . Years of education: N/A   Social History Main Topics  . Smoking status: Never Smoker  . Smokeless tobacco: Never Used  . Alcohol use No  . Drug use: No  . Sexual activity: Not Asked   Other Topics Concern  . None   Social History Narrative  .  None    No Known Allergies   Current Outpatient Prescriptions:  .  amLODipine (NORVASC) 10 MG tablet, TAKE 1 TABLET(10 MG) BY MOUTH DAILY, Disp: 30 tablet, Rfl: 11 .  hydrochlorothiazide (HYDRODIURIL) 25 MG tablet, Take 1 tablet (25 mg total) by mouth daily., Disp: 30 tablet, Rfl: 11 .  TRIUMEQ 600-50-300 MG tablet, TAKE 1 TABLET BY MOUTH DAILY, Disp: 30 tablet, Rfl: 5 .  amoxicillin (AMOXIL) 875 MG tablet, Take 1 tablet (875 mg total) by mouth 2 (two) times daily. (Patient not taking: Reported on 11/05/2016), Disp: 14 tablet, Rfl: 0 .  lidocaine (XYLOCAINE) 2 % solution, Use as directed 20 mLs in the mouth or throat as needed for  mouth pain. (Patient not taking: Reported on 11/05/2016), Disp: 100 mL, Rfl: 0   Review of Systems  Constitutional: Negative for activity change, appetite change, chills, diaphoresis, fatigue and unexpected weight change.  HENT: Negative for facial swelling, rhinorrhea, sinus pressure, sneezing and trouble swallowing.   Eyes: Negative for photophobia and visual disturbance.  Respiratory: Negative for chest tightness and stridor.   Cardiovascular: Negative for palpitations and leg swelling.  Gastrointestinal: Positive for nausea. Negative for abdominal distention, anal bleeding, blood in stool and constipation.  Genitourinary: Negative for difficulty urinating, dysuria, flank pain and hematuria.  Musculoskeletal: Negative for arthralgias, back pain, gait problem, joint swelling and myalgias.  Skin: Negative for color change, pallor and wound.  Neurological: Negative for dizziness, tremors, weakness, light-headedness and headaches.  Hematological: Negative for adenopathy. Does not bruise/bleed easily.  Psychiatric/Behavioral: Negative for agitation, behavioral problems, confusion, decreased concentration, dysphoric mood and sleep disturbance.       Objective:   Physical Exam  Constitutional: She is oriented to person, place, and time. She appears well-developed and well-nourished. No distress.  HENT:  Head: Normocephalic and atraumatic.  Mouth/Throat: Oropharynx is clear and moist.  Eyes: Conjunctivae and EOM are normal. Pupils are equal, round, and reactive to light. No scleral icterus.  Neck: Normal range of motion. Neck supple.  Cardiovascular: Normal rate, regular rhythm and normal heart sounds.  Exam reveals no gallop and no friction rub.   No murmur heard. Pulmonary/Chest: Effort normal and breath sounds normal. No respiratory distress. She has no wheezes. She has no rales. She exhibits no tenderness.  Abdominal: Soft. She exhibits no distension.  Musculoskeletal: She exhibits no  edema or tenderness.  Neurological: She is alert and oriented to person, place, and time. She exhibits normal muscle tone. Coordination normal.  Skin: Skin is warm. She is not diaphoretic.  Psychiatric: She has a normal mood and affect. Her speech is slurred.       Assessment & Plan:   HIV/AIDS: -Continue TRIUMEQ for now, switch to [facility] when able  Hypertension: BP better today Vitals:   11/05/16 0908  BP: 130/86  Pulse: 87  Temp: 98.4 F (36.9 C)   Nausea: Could be due to abacavir again would like to make that switch from abacavir to tenofovir see above discussion.  Overweight: try lowcarbohydrate diet and exercise    I spent greater than 25 minutes with the patient including greater than 50% of time in face to face counsel of the patient re her HIV, HTN, her nausea and obesity and in coordination of her care.

## 2016-11-05 NOTE — Addendum Note (Signed)
Addended by: Linnell FullingBRANNON, LATOYA N on: 11/05/2016 11:34 AM   Modules accepted: Orders

## 2016-11-16 ENCOUNTER — Ambulatory Visit: Payer: BLUE CROSS/BLUE SHIELD

## 2016-12-03 ENCOUNTER — Ambulatory Visit: Payer: BLUE CROSS/BLUE SHIELD

## 2016-12-06 ENCOUNTER — Telehealth (HOSPITAL_COMMUNITY): Payer: Self-pay | Admitting: Emergency Medicine

## 2016-12-06 ENCOUNTER — Other Ambulatory Visit: Payer: Self-pay

## 2016-12-06 ENCOUNTER — Ambulatory Visit (HOSPITAL_COMMUNITY)
Admission: EM | Admit: 2016-12-06 | Discharge: 2016-12-06 | Disposition: A | Discharge status: Home / Self Care | Payer: BLUE CROSS/BLUE SHIELD | Attending: Family Medicine | Admitting: Family Medicine

## 2016-12-06 ENCOUNTER — Encounter (HOSPITAL_COMMUNITY): Payer: Self-pay | Admitting: Emergency Medicine

## 2016-12-06 ENCOUNTER — Other Ambulatory Visit (HOSPITAL_COMMUNITY): Payer: Self-pay

## 2016-12-06 DIAGNOSIS — R6889 Other general symptoms and signs: Secondary | ICD-10-CM | POA: Diagnosis not present

## 2016-12-06 MED ORDER — OSELTAMIVIR PHOSPHATE 75 MG PO CAPS: 75.0000 mg | ORAL_CAPSULE | Freq: Two times a day (BID) | ORAL | 0 refills | Status: DC

## 2016-12-06 MED ORDER — LOPERAMIDE HCL 2 MG PO CAPS: 2.0000 mg | ORAL_CAPSULE | Freq: Four times a day (QID) | ORAL | 0 refills | Status: DC | PRN

## 2016-12-06 NOTE — ED Triage Notes (Signed)
Pt here for intermittent abd pain onset yest associated w/vomiting, diarrhea, fevers  Denies urinary sx, new foods, new meds  A&O x4... NAD

## 2016-12-06 NOTE — Telephone Encounter (Signed)
Pt is here in the office..wants [place] to call Rx's in to [facility]

## 2016-12-06 NOTE — Discharge Instructions (Signed)
Stay on liquids for the next couple days. Usually soup and crackers will help settle the stomach.

## 2016-12-06 NOTE — ED Provider Notes (Signed)
[facility]    CSN: [id number] Arrival date & time: 12/06/16  1501     History   Chief Complaint Chief Complaint  Patient presents with  . Abdominal Pain    HPI Monique [name] is a 46 y.o. female.   Pt here for intermittent abd pain onset yest associated w/vomiting, diarrhea, fevers. She's had congestion as well. Patient has not had a recent CD4 count, but she's been careful about staying on her medicine and the last time it was checked, she was normal.  Denies urinary sx, new foods, new meds  Patient's grandchildren and coworker have all been sick with the flu recently.      Past Medical History:  Diagnosis Date  . HIV (human immunodeficiency virus infection) (HCC)   . Hypertension   . 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 Lower extremity edema 01/12/2015  . Lung abscess (HCC)   . Overweight 01/12/2015  . Pneumonia   . TB (tuberculosis)   . Tonsillitis     Patient Active Problem List   Diagnosis Date Noted  . Obese 08/10/2015  . Essential hypertension 01/12/2015  . Lower extremity edema 01/12/2015  . Overweight 01/12/2015  . 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 DOE (dyspnea on exertion) 09/21/2014  . AIDS (HCC) 09/21/2014  . Ganglion cyst of flexor tendon sheath of finger of left hand 08/17/2014  . Normocytic anemia 05/24/2013  . Human immunodeficiency virus (HIV) disease (HCC) 04/10/2013  . Acute respiratory failure with hypoxia (HCC) 04/10/2013  . Dehydration with hyponatremia 04/10/2013  . Sepsis (HCC) 04/09/2013  . Hypokalemia 04/09/2013  . Hyponatremia 04/09/2013  . Acute kidney injury (HCC) 04/09/2013    Past Surgical History:  Procedure Laterality Date  . CESAREAN SECTION    . VIDEO BRONCHOSCOPY Bilateral 04/13/2013   Procedure: VIDEO BRONCHOSCOPY WITHOUT FLUORO;  Surgeon: Kalman Shan, MD;  Location[facility];  Service: Cardiopulmonary;   Laterality: Bilateral;    OB History    No data available       Home Medications    Prior to Admission medications   Medication Sig Start Date End Date Taking? Authorizing Provider  abacavir-dolutegravir-lamiVUDine (TRIUMEQ) 600-50-300 MG tablet Take 1 tablet by mouth daily. 11/05/16  Yes Randall Hiss, MD  amLODipine (NORVASC) 10 MG tablet TAKE 1 TABLET(10 MG) BY MOUTH DAILY 09/03/16  Yes Randall Hiss, MD  hydrochlorothiazide (HYDRODIURIL) 25 MG tablet Take 1 tablet (25 mg total) by mouth daily. 09/03/16  Yes Randall Hiss, MD  loperamide (IMODIUM) 2 MG capsule Take 1 capsule (2 mg total) by mouth 4 (four) times daily as needed for diarrhea or loose stools. 12/06/16   Elvina Sidle, MD  oseltamivir (TAMIFLU) 75 MG capsule Take 1 capsule (75 mg total) by mouth every 12 (twelve) hours. 12/06/16   Elvina Sidle, MD    Family History Family History  Problem Relation Age of Onset  . Diabetes Sister   . Hypertension Sister     Social History Social History  Substance Use Topics  . Smoking status: Never Smoker  . Smokeless tobacco: Never Used  . Alcohol use No     Allergies   Patient has no known allergies.   Review of Systems Review of Systems  Constitutional: Positive for activity change, appetite change and fever.  HENT: Positive for congestion and sinus pressure.   Gastrointestinal: Positive for diarrhea and nausea. Negative for abdominal pain and vomiting.  Genitourinary: Negative.   Neurological: Positive for headaches.     Physical Exam Triage Vital Signs ED Triage Vitals  Enc Vitals Group     BP 12/06/16 1513 123/86     Pulse Rate 12/06/16 1513 98     Resp 12/06/16 1513 16     Temp 12/06/16 1513 98.7 F (37.1 C)     Temp Source 12/06/16 1513 Oral     SpO2 12/06/16 1513 100 %     Weight --      Height --      Head Circumference --      Peak Flow --      Pain Score 12/06/16 1518 8     Pain Loc --      Pain Edu? --      Excl. in GC?  --    No data found.   Updated Vital Signs BP 123/86 (BP Location: Left Arm)   Pulse 98   Temp 98.7 F (37.1 C) (Oral)   Resp 16   SpO2 100%    Physical Exam  Constitutional: She is oriented to person, place, and time. She appears well-developed and well-nourished.  HENT:  Head: Normocephalic.  Right Ear: External ear normal.  Left Ear: External ear normal.  Mouth/Throat: Oropharynx is clear and moist.  Eyes: Conjunctivae and EOM are normal. Pupils are equal, round, and reactive to light.  Neck: Normal range of motion. Neck supple.  Pulmonary/Chest: Effort normal and breath sounds normal.  Abdominal: Soft. There is no tenderness.  Musculoskeletal: Normal range of motion.  Neurological: She is alert and oriented to person, place, and time.  Skin: Skin is warm and dry.  Nursing note and vitals reviewed.    UC Treatments / Results  Labs (all labs ordered are listed, but only abnormal results are displayed) Labs Reviewed - No data to display  EKG  EKG Interpretation None       Radiology No results found.  Procedures Procedures (including critical care time)  Medications Ordered in UC Medications - No data to display   Initial Impression / Assessment and Plan / UC Course  I have reviewed the triage vital signs and the nursing notes.  Pertinent labs & imaging results that were available during my care of the patient were reviewed by me and considered in my medical decision making (see chart for details).     Final Clinical Impressions(s) / UC Diagnoses   Final diagnoses:  Flu-like symptoms    New Prescriptions New Prescriptions   LOPERAMIDE (IMODIUM) 2 MG CAPSULE    Take 1 capsule (2 mg total) by mouth 4 (four) times daily as needed for diarrhea or loose stools.   OSELTAMIVIR (TAMIFLU) 75 MG CAPSULE    Take 1 capsule (75 mg total) by mouth every 12 (twelve) hours.     Elvina Sidle, MD 12/06/16 [phone]

## 2016-12-21 ENCOUNTER — Ambulatory Visit (HOSPITAL_COMMUNITY)
Admission: EM | Admit: 2016-12-21 | Discharge: 2016-12-21 | Disposition: A | Discharge status: Home / Self Care | Payer: BLUE CROSS/BLUE SHIELD

## 2017-02-06 ENCOUNTER — Encounter: Payer: Self-pay | Admitting: Infectious Disease

## 2017-02-06 ENCOUNTER — Ambulatory Visit (INDEPENDENT_AMBULATORY_CARE_PROVIDER_SITE_OTHER): Payer: Self-pay | Admitting: Infectious Disease

## 2017-02-06 VITALS — BP 136/100 | HR 91 | Temp 99.2°F | Ht 66.0 in | Wt 226.0 lb

## 2017-02-06 DIAGNOSIS — R531 Weakness: Secondary | ICD-10-CM

## 2017-02-06 DIAGNOSIS — R110 Nausea: Secondary | ICD-10-CM

## 2017-02-06 DIAGNOSIS — B20 Human immunodeficiency virus [HIV] disease: Secondary | ICD-10-CM

## 2017-02-06 DIAGNOSIS — G44219 Episodic tension-type headache, not intractable: Secondary | ICD-10-CM

## 2017-02-06 DIAGNOSIS — R519 Headache, unspecified: Secondary | ICD-10-CM

## 2017-02-06 DIAGNOSIS — R51 Headache: Secondary | ICD-10-CM

## 2017-02-06 LAB — CBC WITH DIFFERENTIAL/PLATELET
BASOS ABS: 0 {cells}/uL (ref 0–200)
BASOS PCT: 0 %
EOS ABS: 116 {cells}/uL (ref 15–500)
Eosinophils Relative: 1 %
HEMATOCRIT: 37.3 % (ref 35.0–45.0)
Hemoglobin: 12.3 g/dL (ref 11.7–15.5)
LYMPHS ABS: 2320 {cells}/uL (ref 850–3900)
Lymphocytes Relative: 20 %
MCH: 27.8 pg (ref 27.0–33.0)
MCHC: 33 g/dL (ref 32.0–36.0)
MCV: 84.4 fL (ref 80.0–100.0)
MONO ABS: 928 {cells}/uL (ref 200–950)
MPV: 9.5 fL (ref 7.5–12.5)
Monocytes Relative: 8 %
Neutro Abs: 8236 cells/uL — ABNORMAL HIGH (ref 1500–7800)
Neutrophils Relative %: 71 %
PLATELETS: 308 10*3/uL (ref 140–400)
RBC: 4.42 MIL/uL (ref 3.80–5.10)
RDW: 16.3 % — ABNORMAL HIGH (ref 11.0–15.0)
WBC: 11.6 10*3/uL — ABNORMAL HIGH (ref 3.8–10.8)

## 2017-02-06 LAB — COMPLETE METABOLIC PANEL WITH GFR
ALT: 29 U/L (ref 6–29)
AST: 23 U/L (ref 10–35)
Albumin: 3.8 g/dL (ref 3.6–5.1)
Alkaline Phosphatase: 56 U/L (ref 33–115)
BUN: 14 mg/dL (ref 7–25)
CHLORIDE: 108 mmol/L (ref 98–110)
CO2: 22 mmol/L (ref 20–31)
Calcium: 8.9 mg/dL (ref 8.6–10.2)
Creat: 0.72 mg/dL (ref 0.50–1.10)
GLUCOSE: 87 mg/dL (ref 65–99)
Potassium: 4 mmol/L (ref 3.5–5.3)
Sodium: 139 mmol/L (ref 135–146)
Total Bilirubin: 0.4 mg/dL (ref 0.2–1.2)
Total Protein: 7.4 g/dL (ref 6.1–8.1)

## 2017-02-06 LAB — LIPID PANEL
CHOL/HDL RATIO: 4.8 ratio (ref <5.0)
Cholesterol: 163 mg/dL (ref <200)
HDL: 34 mg/dL — ABNORMAL LOW (ref >50)
LDL CALC: 93 mg/dL (ref <100)
Triglycerides: 178 mg/dL — ABNORMAL HIGH (ref <150)
VLDL: 36 mg/dL — AB (ref <30)

## 2017-02-06 MED ORDER — BICTEGRAVIR-EMTRICITAB-TENOFOV 50-200-25 MG PO TABS: 1.0000 | ORAL_TABLET | Freq: Every day | ORAL | 11 refills | Status: DC

## 2017-02-06 MED ORDER — ONDANSETRON 4 MG PO TBDP: 4.0000 mg | ORAL_TABLET | Freq: Three times a day (TID) | ORAL | 0 refills | Status: DC | PRN

## 2017-02-06 NOTE — Progress Notes (Signed)
HPI: Monique [name] is a 46 y.o. female who presents to the [facility] clinic for follow-up with Dr. Daiva EvesVan Dam.  Allergies: No Known Allergies  Past Medical History: Past Medical History:  Diagnosis Date  . Headache 02/06/2017  . HIV (human immunodeficiency virus infection) (HCC)   . Hypertension   . 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 Lower extremity edema 01/12/2015  . Lung abscess (HCC)   . Nausea 02/06/2017  . Overweight 01/12/2015  . Pneumonia   . TB (tuberculosis)   . Tonsillitis     Social History: Social History   Social History  . Marital status: Single    Spouse name: N/A  . Number of children: N/A  . Years of education: N/A   Social History Main Topics  . Smoking status: Never Smoker  . Smokeless tobacco: Never Used  . Alcohol use No  . Drug use: No  . Sexual activity: Not on file   Other Topics Concern  . Not on file   Social History Narrative  . No narrative on file    Current Regimen: Triumeq  Labs: HIV 1 RNA Quant (copies/mL)  Date Value  08/16/2016 265 (H)  02/29/2016 <20  07/07/2015 <20   CD4 T Cell Abs (/uL)  Date Value  10/22/2016 700  08/16/2016 850  02/29/2016 580   Hepatitis B Surface Ag (no units)  Date Value  04/10/2013 NEGATIVE   HCV Ab (no units)  Date Value  04/10/2013 NEGATIVE    CrCl: CrCl cannot be calculated (Patient's most recent lab result is older than the maximum 21 days allowed.).  Lipids:    Component Value Date/Time   CHOL 177 02/29/2016 1142   TRIG 136 02/29/2016 1142   HDL 36 (L) 02/29/2016 1142   CHOLHDL 4.9 02/29/2016 1142   VLDL 27 02/29/2016 1142   LDLCALC 114 02/29/2016 1142    Assessment: Monique PortsValerie is here today to follow-up with Dr. Daiva EvesVan Dam for her HIV infection.  She is currently on Triumeq but having issues with nausea.  She has been suffering from nausea for quite some time.  We will [facility] to [facility] to see if that helps.  I counseled  her on Biktarvy - one pill once daily with or without food.  Advised her to take it with food if she develops nausea to see if that helps.  Advised her not to miss any doses.  I will send in some Zofran ODT for her to pick up if she needs it. She will follow-up with me in ~1 month for labs and adherence/side effect monitoring.  Plans: - Stop Triumeq - Start Biktarvy 1 pill PO once daily - F/u with me 7/9 at 10am  Monique [name] L. Lynnsey Barbara, PharmD, CPP Infectious Diseases Clinical Pharmacist [facility] 02/06/2017, 2:09 PM

## 2017-02-06 NOTE — Patient Instructions (Signed)
[facility] to [facility]  We will get labs today  I want yo to come back and see one of our ID pharmacists in ONE Month to recheck labs  You were also supposed to be on HCTZ for your BP  WE finally need to renew ADAP in July

## 2017-02-06 NOTE — Progress Notes (Signed)
Chief complaint is followup for HIV on meds, still complaining of nausea when taking TRIUMEQ Subjective:    Patient ID: Monique [name], female    DOB: 1971-06-19, 46 y.o.   MRN: [id number]  ++    46 year old African American lady with HIV AIDS ( August 2014) also with lung abscess . She was initially treated for both lung abscess and possibility of pulmonary tuberculosis. See prior notes for workup ultimately all data was negative for pulmonary tuberculosis and she did finish a course of therapy for latent tuberculosis.  She also had protracted Augmentin .   I  simplified her antiretroviral medications to [facility] and she had been withan undetectable viral load since healthy CD4 count. However at hervisit  In July 2015 her viral load was suddenly 28,124 copies. Her genotype showed no resistance and no increased resistance.We got her renewed on ADAP and she was again nicely suppressed ever since then.  She had been doing relatively well but her viral load has ticked up to 265 when checked in December on questioning her she admitted that she had missed at least 4 doses before her labs were taught taken and she may have missed even more since then. She stated  that the [facility] does make her feel a bit nauseous this does improve with time but it is a problem for her particular when she restarts the medications. She told me that she physically felt fine but I had to explain once again that HIV does not cause symptoms in total late in "the game" when it destroys the immune system. I also emphasized that HIV needed to be suppressed not only for own immune health but from a standpoint of preventing transmission to others.  Her repeat VL came down to 53.    Lab Results  Component Value Date   HIV1RNAQUANT 265 (H) 08/16/2016   HIV1RNAQUANT <20 02/29/2016   HIV1RNAQUANT <20 07/07/2015      Lab Results  Component Value Date   CD4TABS 700 10/22/2016   CD4TABS 850 08/16/2016   CD4TABS 580  02/29/2016   She had insurance but lost this again and now on ADAP  She continues to suffer from nausea which as been particularly worse recently along with headaches. She is not taking HCTZ with her amlodipine.     Past Medical History:  Diagnosis Date  . HIV (human immunodeficiency virus infection) (HCC)   . Hypertension   . 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 Lower extremity edema 01/12/2015  . Lung abscess (HCC)   . Overweight 01/12/2015  . Pneumonia   . TB (tuberculosis)   . Tonsillitis     Past Surgical History:  Procedure Laterality Date  . CESAREAN SECTION    . VIDEO BRONCHOSCOPY Bilateral 04/13/2013   Procedure: VIDEO BRONCHOSCOPY WITHOUT FLUORO;  Surgeon: Kalman Shan, MD;  Location: [facility] ENDOSCOPY;  Service: Cardiopulmonary;  Laterality: Bilateral;    Family History  Problem Relation Age of Onset  . Diabetes Sister   . Hypertension Sister       Social History   Social History  . Marital status: Single    Spouse name: N/A  . Number of children: N/A  . Years of education: N/A   Social History Main Topics  . Smoking status: Never Smoker  . Smokeless tobacco: Never Used  . Alcohol use No  . Drug use: No  . Sexual activity: Not on file   Other Topics Concern  . Not on file  Social History Narrative  . No narrative on file    No Known Allergies   Current Outpatient Prescriptions:  .  abacavir-dolutegravir-lamiVUDine (TRIUMEQ) 600-50-300 MG tablet, Take 1 tablet by mouth daily., Disp: 30 tablet, Rfl: 5 .  amLODipine (NORVASC) 10 MG tablet, TAKE 1 TABLET(10 MG) BY MOUTH DAILY, Disp: 30 tablet, Rfl: 11 .  hydrochlorothiazide (HYDRODIURIL) 25 MG tablet, Take 1 tablet (25 mg total) by mouth daily. (Patient not taking: Reported on 02/06/2017), Disp: 30 tablet, Rfl: 11 .  loperamide (IMODIUM) 2 MG capsule, Take 1 capsule (2 mg total) by mouth 4 (four) times daily as needed for diarrhea or loose stools. (Patient  not taking: Reported on 02/06/2017), Disp: 12 capsule, Rfl: 0 .  oseltamivir (TAMIFLU) 75 MG capsule, Take 1 capsule (75 mg total) by mouth every 12 (twelve) hours. (Patient not taking: Reported on 02/06/2017), Disp: 10 capsule, Rfl: 0   Review of Systems  Constitutional: Negative for activity change, appetite change, chills, diaphoresis, fatigue and unexpected weight change.  HENT: Negative for facial swelling, rhinorrhea, sinus pressure, sneezing and trouble swallowing.   Eyes: Negative for photophobia and visual disturbance.  Respiratory: Negative for chest tightness and stridor.   Cardiovascular: Negative for palpitations and leg swelling.  Gastrointestinal: Positive for nausea. Negative for abdominal distention, anal bleeding, blood in stool and constipation.  Genitourinary: Negative for difficulty urinating, dysuria, flank pain and hematuria.  Musculoskeletal: Negative for arthralgias, back pain, gait problem, joint swelling and myalgias.  Skin: Negative for color change, pallor and wound.  Neurological: Positive for weakness, light-headedness and headaches. Negative for dizziness and tremors.  Hematological: Negative for adenopathy. Does not bruise/bleed easily.  Psychiatric/Behavioral: Negative for agitation, behavioral problems, confusion, decreased concentration, dysphoric mood and sleep disturbance.       Objective:   Physical Exam  Constitutional: She is oriented to person, place, and time. She appears well-developed and well-nourished. No distress.  HENT:  Head: Normocephalic and atraumatic.  Mouth/Throat: Oropharynx is clear and moist.  Eyes: Conjunctivae and EOM are normal. Pupils are equal, round, and reactive to light. No scleral icterus.  Neck: Normal range of motion. Neck supple.  Cardiovascular: Normal rate, regular rhythm and normal heart sounds.  Exam reveals no gallop and no friction rub.   No murmur heard. Pulmonary/Chest: Effort normal and breath sounds normal. No  respiratory distress. She has no wheezes. She has no rales. She exhibits no tenderness.  Abdominal: Soft. She exhibits no distension.  Musculoskeletal: She exhibits no edema or tenderness.  Neurological: She is alert and oriented to person, place, and time. She exhibits normal muscle tone. Coordination normal.  Skin: Skin is warm. She is not diaphoretic.  Psychiatric: She has a normal mood and affect. Her speech is slurred.       Assessment & Plan:   Nausea, malaise, weakness: check labs today, agree with zofran for nausea. Change to BIKTARVY to see if getting ABC out of this regimen will help  HA: could be due to her HTN: Will ask to add back HCTZ when feeling better  HIV/AIDS: -check labs today and then , switch to BIKTARVY and see Cassie in one month. RENEW ADAP  Hypertension: BP not too bad but she is supposed to be on 2 drugs  There were no vitals filed for this visit.  Overweight: try lowcarbohydrate diet and exercise    I spent greater than 25 minutes with the patient including greater than 50% of time in face to face counsel of the patient re her  HIV, HTN, her nausea, headaches, weakness  and obesity and in coordination of her care.

## 2017-02-07 LAB — T-HELPER CELL (CD4) - (RCID CLINIC ONLY)
CD4 % Helper T Cell: 37 % (ref 33–55)
CD4 T CELL ABS: 890 /uL (ref 400–2700)

## 2017-02-07 LAB — URINE CYTOLOGY ANCILLARY ONLY
CHLAMYDIA, DNA PROBE: NEGATIVE
NEISSERIA GONORRHEA: NEGATIVE

## 2017-02-07 LAB — RPR

## 2017-02-08 ENCOUNTER — Telehealth: Payer: Self-pay | Admitting: Pharmacist Clinician (PhC)/ Clinical Pharmacy Specialist

## 2017-02-08 NOTE — Telephone Encounter (Signed)
Monique [name] called to say that she has been really tired and would like to call out of work today. Her therapy was recently changed from Triumeq to GalvaBiktarvy. Explained to her that the side effects for fatigue is only 2-3% and to give it more time and it'll pass by. She would like a note so she could be excuse from work.

## 2017-02-10 NOTE — Telephone Encounter (Signed)
I had thought we had already written a note excusing her from work. I am find if she needs to miss further work but these ssx predated cahnge to [employer]

## 2017-02-12 ENCOUNTER — Telehealth: Payer: Self-pay | Admitting: *Deleted

## 2017-02-12 NOTE — Telephone Encounter (Signed)
Chills, lower back pain x 2 days.  Cough, History of lung abscess, Acute resp failure, 2014.  Requesting to be seen.  No PCP on file.  Offered and accepted appointment with NP for 02/13/17.

## 2017-02-13 ENCOUNTER — Encounter: Payer: Self-pay | Admitting: Infectious Diseases

## 2017-02-13 ENCOUNTER — Ambulatory Visit (INDEPENDENT_AMBULATORY_CARE_PROVIDER_SITE_OTHER): Payer: Self-pay | Admitting: Infectious Diseases

## 2017-02-13 VITALS — BP 132/89 | HR 96 | Temp 98.1°F | Wt 220.0 lb

## 2017-02-13 DIAGNOSIS — M545 Low back pain, unspecified: Secondary | ICD-10-CM

## 2017-02-13 DIAGNOSIS — R0981 Nasal congestion: Secondary | ICD-10-CM

## 2017-02-13 DIAGNOSIS — J0100 Acute maxillary sinusitis, unspecified: Secondary | ICD-10-CM

## 2017-02-13 DIAGNOSIS — R8290 Unspecified abnormal findings in urine: Secondary | ICD-10-CM

## 2017-02-13 DIAGNOSIS — N3000 Acute cystitis without hematuria: Secondary | ICD-10-CM

## 2017-02-13 LAB — HIV-1 RNA QUANT-NO REFLEX-BLD
HIV 1 RNA Quant: <20 copies/mL
HIV-1 RNA QUANT, LOG: NOT DETECTED {Log_copies}/mL

## 2017-02-13 NOTE — Assessment & Plan Note (Signed)
Suspect this is due to viral cause at this point. Encouraged rest, PO fluids, ibuprofen should she develop pain/headaches, Coricidin and mucinex for supportive care. Minimal sinus pressure to right maxillary and middle ear effusion to right noted. Otherwise exam benign. Exam not concerning for pneumonia. She requested additional 2 days out of work - letter provided. Should she develop fevers, worsening/prolonged symptoms would consider antibiotic. Should she develop any lower respiratory symptoms or chest pain low threshold to check CXR with her history of PNA/Abscesses.

## 2017-02-13 NOTE — Assessment & Plan Note (Signed)
Suspect UTI with no mechanism of injury, chills and history of diarrhea, although she denies typical urinary symptoms. Will check urinalysis today with reflex to culture. Encouraged to increase fluid intake incase a component of her symptoms include dehydration from diarrhea. Ibuprofen for back pain.

## 2017-02-13 NOTE — Progress Notes (Signed)
Subjective:    Patient ID: Monique [name], female    DOB: Mar 17, 1971, 46 y.o.   MRN: [id number]  Presents today with low back pain, diarrhea, nasal congestion and cough.  Back Pain = This is a new problem for Monique [name]. Symptoms started 3 days ago. Describes the pain to be sharp and intermittent diffusely in lower back. Worsened by walking. Associated symptoms include mild chills. She has not taken anything for the pain. Describes the course to be about the same.   Nasal Congestion = This is a new problem for Monique [name]. Symptoms started yesterday when she woke up and found to have difficulty breathing through her nose. Associated symptoms include mild chills, fatigue, sore throat, cough and drainage. Has not tried anything to help symptoms.   Diarrhea = Started last week since transitioning to [place]iktarvy from [place]q. Overall reports improvement in nausea on new med but now with diarrhea. Reports stools are loose but not liquid and she is having 2 a day. Denies abdominal pain/cramping. Not much of an appetite currently. She is taking Zofran SL to assist with nausea if she experiences it.    Review of Systems  Constitutional: Positive for appetite change and fatigue.  HENT: Positive for congestion, postnasal drip, sinus pressure and sore throat. Negative for ear pain, facial swelling and sneezing.   Eyes: Negative for redness and itching.  Respiratory: Negative for chest tightness, shortness of breath and wheezing.   Cardiovascular: Negative for chest pain.  Gastrointestinal: Positive for nausea. Negative for abdominal distention.  Genitourinary: Negative for decreased urine volume, dysuria, frequency and urgency.  Musculoskeletal: Positive for back pain.  Skin: Negative for rash.   Past Medical History:  Diagnosis Date  . Headache 02/06/2017  . HIV (human immunodeficiency virus infection) (HCC)   . Hypertension   . 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 Lower extremity edema 01/12/2015  . Lung abscess (HCC)   . Nausea 02/06/2017  . Overweight 01/12/2015  . Pneumonia   . TB (tuberculosis)   . Tonsillitis     Recently in clinic with Dr. Daiva EvesVan Dam last week. Labs reviewed at that time and noted slightly elevated WBC count which was improved from labs 4819m prior. No complaints at these visits.   Objective:   Physical Exam  Constitutional: She is oriented to person, place, and time. She appears well-developed and well-nourished. No distress.  Appears fatigued today.  HENT:  Right Ear: No tenderness. Tympanic membrane is not erythematous and not bulging. A middle ear effusion is present.  Left Ear: No tenderness. Tympanic membrane is not erythematous and not bulging.  No middle ear effusion.  Nose: Mucosal edema present. Right sinus exhibits maxillary sinus tenderness. Right sinus exhibits no frontal sinus tenderness. Left sinus exhibits no maxillary sinus tenderness and no frontal sinus tenderness.  Mouth/Throat: Posterior oropharyngeal erythema present. No oropharyngeal exudate.  Eyes: Right eye exhibits no discharge. Left eye exhibits no discharge.  Cardiovascular: Normal rate, regular rhythm and normal heart sounds.   Pulmonary/Chest: Effort normal and breath sounds normal. No respiratory distress. She has no wheezes. She has no rales.  Abdominal: Soft. She exhibits no distension. There is no tenderness.  Musculoskeletal:  No visual deformity. Negative CVA tenderness bilaterally. Non-tender with palpation   Lymphadenopathy:    She has no cervical adenopathy.  Neurological: She is alert and oriented to person, place, and time.  Skin: Skin is dry. No rash noted.       Assessment &  Plan:   Nasal Congestion =  Suspect this is due to viral cause at this point. Encouraged rest, PO fluids, ibuprofen should she develop pain/headaches, Coricidin and mucinex for supportive care. Minimal sinus pressure to right maxillary and middle ear  effusion to right noted. Otherwise exam benign. Exam not concerning for pneumonia. She requested additional 2 days out of work - letter provided. Should she develop fevers, worsening/prolonged symptoms would consider antibiotic. Should she develop any lower respiratory symptoms or chest pain low threshold to check CXR with her history of PNA/Abscesses.   Acute Low-Back Pain =  Suspect UTI with no mechanism of injury, chills and history of diarrhea, although she denies typical urinary symptoms. Will check urinalysis today with reflex to culture. Encouraged to increase fluid intake incase a component of her symptoms include dehydration from diarrhea. Ibuprofen for back pain.  Diarrhea =  A/W Biktarvy. Encouraged her to increase PO fluid intake and can try OTC generic Imodium  She has follow up appt with Pharmacist on July 9th to follow with medication change / diarrhea.    FU with [facility] Provider as needed until next scheduled appt in Sept with Dr. Daiva Eves  Rexene Alberts, MSN, NP-C [facility]  02/13/17 10:34 AM

## 2017-02-13 NOTE — Patient Instructions (Signed)
For your nasal congestion and cough:  - Take Coricidin HBP (this is over the counter) - This is a decongestant  - Mucinex will help you cough up and thin drainage - Increase you fluid intake - this will help thin secretions also and get rid of some of that drainage  We will check your urine today for possible bladder infection   Can take Ibuprofen for your back pain   If you find that you are getting high fevers or worsening with how you feel call us back.

## 2017-02-14 LAB — URINALYSIS, ROUTINE W REFLEX MICROSCOPIC
Bilirubin Urine: NEGATIVE
GLUCOSE, UA: NEGATIVE
Hgb urine dipstick: NEGATIVE
Ketones, ur: NEGATIVE
NITRITE: NEGATIVE
PROTEIN: NEGATIVE
Specific Gravity, Urine: 1.016 (ref 1.001–1.035)
pH: 5.5 (ref 5.0–8.0)

## 2017-02-14 LAB — URINALYSIS, MICROSCOPIC ONLY
Casts: NONE SEEN [LPF]
Crystals: NONE SEEN [HPF]
YEAST: NONE SEEN [HPF]

## 2017-02-14 MED ORDER — AMOXICILLIN-POT CLAVULANATE 875-125 MG PO TABS: 1.0000 | ORAL_TABLET | Freq: Two times a day (BID) | ORAL | 0 refills | Status: AC

## 2017-02-14 NOTE — Progress Notes (Signed)
Patient with large pyuria and few bacteria on urine microscopy. Upon further review she was feeling ill, congested to lesser extent and with headaches last week when she saw Dr. Daiva EvesVan Dam. Symptoms seem to have suddenly worsened 2 days ago not suddenly appeared in discussing with her further today. Will Tx with Augmentin 875/125mg  PO BID x 7d for dual coverage against bacterial sinusitis and urinary tract infection likely d/t ecoli in setting of diarrhea. Will attempt to get urine culture added to sample yesterday. Advised if symptoms do not improve she should be re-evaluated at Urgent Care. She can return to work as previously discussed.

## 2017-02-14 NOTE — Addendum Note (Signed)
Addended by: Blanchard KelchIXON, Grae Leathers N on: 02/14/2017 11:21 AM   Modules accepted: Orders

## 2017-03-04 ENCOUNTER — Ambulatory Visit (INDEPENDENT_AMBULATORY_CARE_PROVIDER_SITE_OTHER): Payer: Self-pay | Admitting: Pharmacist

## 2017-03-04 ENCOUNTER — Ambulatory Visit: Payer: Self-pay

## 2017-03-04 DIAGNOSIS — B20 Human immunodeficiency virus [HIV] disease: Secondary | ICD-10-CM

## 2017-03-04 NOTE — Progress Notes (Signed)
HPI: Monique [name] is a 46 y.o. female who presents to the [facility] pharmacy clinic for HIV follow-up after recently switching her medications.   Allergies: No Known Allergies  Past Medical History: Past Medical History:  Diagnosis Date  . Headache 02/06/2017  . HIV (human immunodeficiency virus infection) (HCC)   . Hypertension   . 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 Lower extremity edema 01/12/2015  . Lung abscess (HCC)   . Nausea 02/06/2017  . Overweight 01/12/2015  . Pneumonia   . TB (tuberculosis)   . Tonsillitis     Social History: Social History   Social History  . Marital status: Single    Spouse name: N/A  . Number of children: N/A  . Years of education: N/A   Social History Main Topics  . Smoking status: Never Smoker  . Smokeless tobacco: Never Used  . Alcohol use No  . Drug use: No  . Sexual activity: Not on file   Other Topics Concern  . Not on file   Social History Narrative  . No narrative on file    Current Regimen: Biktary  Labs: HIV 1 RNA Quant (copies/mL)  Date Value  02/06/2017 <20 NOT DETECTED  08/16/2016 265 (H)  02/29/2016 <20   CD4 T Cell Abs (/uL)  Date Value  02/06/2017 890  10/22/2016 700  08/16/2016 850   Hepatitis B Surface Ag (no units)  Date Value  04/10/2013 NEGATIVE   HCV Ab (no units)  Date Value  04/10/2013 NEGATIVE    CrCl: CrCl cannot be calculated (Patient's most recent lab result is older than the maximum 21 days allowed.).  Lipids:    Component Value Date/Time   CHOL 163 02/06/2017 0949   TRIG 178 (H) 02/06/2017 0949   HDL 34 (L) 02/06/2017 0949   CHOLHDL 4.8 02/06/2017 0949   VLDL 36 (H) 02/06/2017 0949   LDLCALC 93 02/06/2017 0949    Assessment: Monique [name] is here today to follow-up with pharmacy for her HIV infection.  She recently switched from Triumeq to [place]Biktarvy.  She was having some issues at first and saw our ID NP, Stephanie, ~2.5 weeks ago.  Since  then, she completed the antibiotic. She is still complaining of nasal congestion, but no headaches, tenderness, or other issues.  She thinks it may be due to sleeping under the air conditioner unit in her bedroom.  I also told her to try some allergy medicine as well (generic Allegra or Claritin). Her back pain is better but still hurts her sometimes, told her to continue taking ibuprofen for that. Her diarrhea is much better and not an issue anymore.  She states she likes the [place]iktarvy better and has missed no doses.  Her nausea has also resolved since switching.  Will check labs today.    Plans: - Continue Biktarvy - HIV VL, CD4 today - F/u with Dr. Daiva EvesVan Dam 9/17 at 3pm  Maryah Marinaro L. Delayza Lungren, PharmD, CPP Infectious Diseases Clinical Pharmacist [facility] 03/04/2017, 10:19 AM

## 2017-03-05 LAB — T-HELPER CELL (CD4) - (RCID CLINIC ONLY)
CD4 % Helper T Cell: 30 % — ABNORMAL LOW (ref 33–55)
CD4 T CELL ABS: 780 /uL (ref 400–2700)

## 2017-03-06 LAB — HIV-1 RNA QUANT-NO REFLEX-BLD
HIV 1 RNA Quant: <20 copies/mL
HIV-1 RNA QUANT, LOG: NOT DETECTED {Log_copies}/mL

## 2017-03-18 ENCOUNTER — Telehealth: Payer: Self-pay | Admitting: *Deleted

## 2017-03-18 NOTE — Telephone Encounter (Signed)
Tired, back hurting, UTI symptoms improved. feels like "lung" infection.  Requesting appt.  Patient was seen by S. Durwin Noraixon, NP in June 2018.  Scheduled patient with S. Dixon for these problems.

## 2017-03-19 ENCOUNTER — Telehealth: Payer: Self-pay | Admitting: *Deleted

## 2017-03-19 ENCOUNTER — Encounter: Payer: Self-pay | Admitting: Infectious Diseases

## 2017-03-19 ENCOUNTER — Ambulatory Visit (INDEPENDENT_AMBULATORY_CARE_PROVIDER_SITE_OTHER): Payer: Self-pay | Admitting: Infectious Diseases

## 2017-03-19 VITALS — Ht 62.0 in | Wt 220.0 lb

## 2017-03-19 DIAGNOSIS — M545 Low back pain, unspecified: Secondary | ICD-10-CM

## 2017-03-19 MED ORDER — PREDNISONE 20 MG PO TABS: 40.0000 mg | ORAL_TABLET | Freq: Every day | ORAL | 0 refills | Status: AC

## 2017-03-19 NOTE — Patient Instructions (Addendum)
You have one medication available for pick up at pharmacy called prednisone. This will hopefully help inflammation in your back.   Extra Strength Tylenol 2 tablets up to 3 times a day for pain control.   I would look into getting a back brace for work and well-fitting supported shoes.   Try moist heat to your sore back multiple times a day for 15 minute intervals to help.   Don't just stay in bed - gentle stretching will be helpful in the long run

## 2017-03-19 NOTE — Telephone Encounter (Signed)
Wants to talk with provider from the morning, S. Durwin Noraixon, NP.  Has a follow-up question for S. Dixon.

## 2017-03-19 NOTE — Progress Notes (Signed)
Patient Name: Monique [name] MRN: [id number] DOB: [birth year]  Reason for Visit: back pain   SUBJECTIVE:  HPI: Monique [name] is a 46 y.o. female that presents today for evaluation of acute back pain. Started yesterday suddenly without known potential injury. She has not attempted any medications for relief. Bending over makes pain worse. 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 Did not go to work yesterday due to pain and "difficulty getting out of bed." Reports the pain is tight/pulling and stabbing at times with particular movement localized to the lower right back above iliac crest.   Of note no further issues with urinary tract symptoms as she was having one month prior. Described some vaginal itching/burning during course of antibiotic that resolved with vagisil. Denies any breathing concerns today.   Patient Active Problem List   Diagnosis Date Noted  . Acute bilateral low back pain 02/13/2017  . Nasal congestion 02/13/2017  . Nausea 02/06/2017  . Headache 02/06/2017  . Obese 08/10/2015  . Essential hypertension 01/12/2015  . Lower extremity edema 01/12/2015  . Overweight 01/12/2015  . 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 DOE (dyspnea on exertion) 09/21/2014  . Ganglion cyst of flexor tendon sheath of finger of left hand 08/17/2014  . Normocytic anemia 05/24/2013  . Human immunodeficiency virus (HIV) disease (HCC) 04/10/2013  . Acute respiratory failure with hypoxia (HCC) 04/10/2013  . Sepsis (HCC) 04/09/2013  . Acute kidney injury (HCC) 04/09/2013    Patient's Medications  New Prescriptions   PREDNISONE (DELTASONE) 20 MG TABLET    Take 2 tablets (40 mg total) by mouth daily with breakfast.  Previous Medications   AMLODIPINE (NORVASC) 10 MG TABLET    TAKE 1 TABLET(10 MG) BY MOUTH DAILY   BICTEGRAVIR-EMTRICITABINE-TENOFOVIR AF (BIKTARVY) 50-200-25 MG TABS TABLET    Take  1 tablet by mouth daily.   HYDROCHLOROTHIAZIDE (HYDRODIURIL) 25 MG TABLET    Take 1 tablet (25 mg total) by mouth daily.   LOPERAMIDE (IMODIUM) 2 MG CAPSULE    Take 1 capsule (2 mg total) by mouth 4 (four) times daily as needed for diarrhea or loose stools.   ONDANSETRON (ZOFRAN ODT) 4 MG DISINTEGRATING TABLET    Take 1 tablet (4 mg total) by mouth every 8 (eight) hours as needed for nausea or vomiting.  Modified Medications   No medications on file  Discontinued Medications   No medications on file    Review of Systems  Constitutional: Negative for fever.  Respiratory: Negative for cough, sputum production and shortness of breath.   Cardiovascular: Negative for chest pain and orthopnea.  Gastrointestinal: Negative for abdominal pain, constipation and diarrhea.  Genitourinary: Negative for dysuria.  Musculoskeletal: Positive for back pain. Negative for falls.  Neurological: Negative for dizziness, weakness and headaches.      Past Medical History:  Diagnosis Date  . Headache 02/06/2017  . HIV (human immunodeficiency virus infection) (HCC)   . Hypertension   . 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 Lower extremity edema 01/12/2015  . Lung abscess (HCC)   . Nausea 02/06/2017  . Overweight 01/12/2015  . Pneumonia   . TB (tuberculosis)   . Tonsillitis     Social History  Substance Use Topics  . Smoking status: Never Smoker  . Smokeless tobacco: Never Used  . Alcohol use No    Family History  Problem  Relation Age of Onset  . Diabetes Sister   . Hypertension Sister      No Known Allergies   OBJECTIVE: Vitals:   03/19/17 1112  Weight: 220 lb (99.8 kg)  Height: 5\' 2"  (1.575 m)   Body mass index is 40.24 kg/m.  Physical Exam  Constitutional: She is oriented to person, place, and time and well-developed, well-nourished, and in no distress.  Neck: Normal range of motion.  Cardiovascular: Normal rate, regular rhythm and normal  heart sounds.   Pulmonary/Chest: Effort normal and breath sounds normal.  Abdominal: Soft. There is no tenderness.  Musculoskeletal:       Lumbar back: She exhibits tenderness and pain. She exhibits no bony tenderness, no swelling and no deformity.       Back:  Neurological: She is alert and oriented to person, place, and time.  Skin: Skin is warm and dry. No rash noted.  Psychiatric: Mood and affect normal.     ASSESSMENT & PLAN:  Problem List Items Addressed This Visit      Other   Acute bilateral low back pain    Likely acute lumbar strain to lower back. No concerning symptoms that would suggest severe injury, no indications at this point for imaging as it is more flank and muscular in nature. No bony tenderness over spine Would like to use short course Mobic for her for acute pain/inflammation, however this will not be covered by ADAP. Will try 5d burst steroids to see if this offers her relief from any inflammation. Advised OTC tylenol for pain in addition to moist heat applied to area multiple times a day. 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 Will return if symptoms do not improve.       Relevant Medications   predniSONE (DELTASONE) 20 MG tablet    Other Visit Diagnoses    Acute left-sided low back pain without sciatica    -  Primary   Relevant Medications   predniSONE (DELTASONE) 20 MG tablet      Rexene AlbertsStephanie Graciano Batson, MSN, [facility]NP[facility]  03/19/2017  12:31 PM

## 2017-03-19 NOTE — Telephone Encounter (Signed)
Patient showed up to clinic wanting to discuss work note to cover for yesterday. She called into work yesterday AM but did not call clinic for appointment until yesterday afternoon and first appt offered was with me today. I told her that this is the second time I am discussing this matter with her regarding extending note coverage back to the day of the phone call and this is not something that is reasonable especially when I do not see reason for her to be out of work for current complaint. I informed her that in the future our clinic availability is tight and we will not always be able to see her same day and Urgent Care may be more appropriate for her. She understood. I addended the note to include she called requesting an appointment late yesterday afternoon and was seen mid-morning today but would not write to excuse her - it is up to her employer to make that determination.

## 2017-03-19 NOTE — Assessment & Plan Note (Signed)
Likely acute lumbar strain to lower back. No concerning symptoms that would suggest severe injury, no indications at this point for imaging as it is more flank and muscular in nature. No bony tenderness over spine Would like to use short course Mobic for her for acute pain/inflammation, however this will not be covered by ADAP. Will try 5d burst steroids to see if this offers her relief from any inflammation. Advised OTC tylenol for pain in addition to moist heat applied to area multiple times a day. 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 Will return if symptoms do not improve.

## 2017-05-13 ENCOUNTER — Other Ambulatory Visit: Payer: Self-pay

## 2017-05-13 ENCOUNTER — Ambulatory Visit (HOSPITAL_COMMUNITY)
Admission: RE | Admit: 2017-05-13 | Discharge: 2017-05-13 | Disposition: A | Discharge status: Home / Self Care | Payer: Self-pay | Source: Ambulatory Visit | Attending: Infectious Disease | Admitting: Infectious Disease

## 2017-05-13 ENCOUNTER — Ambulatory Visit (INDEPENDENT_AMBULATORY_CARE_PROVIDER_SITE_OTHER): Payer: Self-pay | Admitting: Infectious Disease

## 2017-05-13 ENCOUNTER — Encounter: Payer: Self-pay | Admitting: Infectious Disease

## 2017-05-13 VITALS — BP 135/92 | HR 83 | Temp 98.3°F | Ht 63.0 in | Wt 223.0 lb

## 2017-05-13 DIAGNOSIS — Z23 Encounter for immunization: Secondary | ICD-10-CM

## 2017-05-13 DIAGNOSIS — R0789 Other chest pain: Secondary | ICD-10-CM | POA: Insufficient documentation

## 2017-05-13 DIAGNOSIS — Z113 Encounter for screening for infections with a predominantly sexual mode of transmission: Secondary | ICD-10-CM

## 2017-05-13 DIAGNOSIS — R0609 Other forms of dyspnea: Secondary | ICD-10-CM

## 2017-05-13 DIAGNOSIS — R079 Chest pain, unspecified: Secondary | ICD-10-CM

## 2017-05-13 DIAGNOSIS — B20 Human immunodeficiency virus [HIV] disease: Secondary | ICD-10-CM

## 2017-05-13 DIAGNOSIS — Z79899 Other long term (current) drug therapy: Secondary | ICD-10-CM

## 2017-05-13 DIAGNOSIS — I10 Essential (primary) hypertension: Secondary | ICD-10-CM

## 2017-05-13 HISTORY — DX: Chest pain, unspecified: R07.9

## 2017-05-13 MED ORDER — ASPIRIN 81 MG PO TABS: 81.0000 mg | ORAL_TABLET | Freq: Once | ORAL | Status: DC

## 2017-05-13 MED ORDER — HYDROCHLOROTHIAZIDE 25 MG PO TABS: 25.0000 mg | ORAL_TABLET | Freq: Every day | ORAL | 11 refills | Status: DC

## 2017-05-13 NOTE — Progress Notes (Signed)
Chief complaint is followup for HIV on meds feels fatigued, DOE, and endorsed several episodes of chest discomfort with exertion a week ago   Subjective:    Patient ID: Monique [name], female    DOB: [birth year], 46 y.o.   MRN: [id number]  HPI  46 year old African American lady with HIV AIDS ( August 2014) also with lung abscess . She was initially treated for both lung abscess and possibility of pulmonary tuberculosis. See prior notes for workup ultimately all data was negative for pulmonary tuberculosis and she did finish a course of therapy for latent tuberculosis.  She also had protracted Augmentin .   I  simplified her antiretroviral medications to [facility] and she had been withan undetectable viral load since healthy CD4 count. However at hervisit  In July 2015 her viral load was suddenly 28,124 copies. Her genotype showed no resistance and no increased resistance.We got her renewed on ADAP and she was again nicely suppressed ever since then.  She had been doing relatively well but her viral load has ticked up to 265 when checked in December on questioning her she admitted that she had missed at least 4 doses before her labs were taught taken and she may have missed even more since then. She stated  that the [facility] does make her feel a bit nauseous this does improve with time but it is a problem for her particular when she restarts the medications.  We had therefore changed her to [facility] and she remains suppressed. She has improvement in nausea but c/o weight gain.  She had abdominal pain and back -pain and was seen by Rexene Alberts NP and there was concern for UTI. Pt says she felt better after she took a laxative that resolved her abdominal pain and back pain.  When I was explaining how the brain and body are not precisely connected in terms of sensation processing from viscera to brain and gave example of angina she then endorsed that withi n the past 10 days she has had several  episodes of chest discomfort left mid chest with radiation and numbness down the left side of her arm. These episodes have occurred with exertion and have lasted a few minutes responding to rest. Severity is 6/10 dull discomfort. No diaphoresis or nausea or vomiting.   She is not a smoker    Lab Results  Component Value Date   HIV1RNAQUANT <20 NOT DETECTED 03/04/2017   HIV1RNAQUANT <20 NOT DETECTED 02/06/2017   HIV1RNAQUANT 265 (H) 08/16/2016      Lab Results  Component Value Date   CD4TABS 780 03/04/2017   CD4TABS 890 02/06/2017   CD4TABS 700 10/22/2016   She had insurance but lost this again and now on ADAP  She continues to suffer from nausea which as been particularly worse recently along with headaches. She is not taking HCTZ with her amlodipine.     Past Medical History:  Diagnosis Date  . Headache 02/06/2017  . HIV (human immunodeficiency virus infection) (HCC)   . Hypertension   . 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 Lower extremity edema 01/12/2015  . Lung abscess (HCC)   . Nausea 02/06/2017  . Overweight 01/12/2015  . Pneumonia   . TB (tuberculosis)   . Tonsillitis     Past Surgical History:  Procedure Laterality Date  . CESAREAN SECTION    . VIDEO BRONCHOSCOPY Bilateral 04/13/2013   Procedure: VIDEO BRONCHOSCOPY WITHOUT FLUORO;  Surgeon: Kalman Shan, MD;  Location:  [facility] ENDOSCOPY;  Service: Cardiopulmonary;  Laterality: Bilateral;    Family History  Problem Relation Age of Onset  . Diabetes Sister   . Hypertension Sister       Social History   Social History  . Marital status: Single    Spouse name: N/A  . Number of children: N/A  . Years of education: N/A   Social History Main Topics  . Smoking status: Never Smoker  . Smokeless tobacco: Never Used  . Alcohol use No  . Drug use: No  . Sexual activity: Yes    Partners: Male   Other Topics Concern  . None   Social History Narrative  . None     No Known Allergies   Current Outpatient Prescriptions:  .  amLODipine (NORVASC) 10 MG tablet, TAKE 1 TABLET(10 MG) BY MOUTH DAILY, Disp: 30 tablet, Rfl: 11 .  bictegravir-emtricitabine-tenofovir AF (BIKTARVY) 50-200-25 MG TABS tablet, Take 1 tablet by mouth daily., Disp: 30 tablet, Rfl: 11 .  hydrochlorothiazide (HYDRODIURIL) 25 MG tablet, Take 1 tablet (25 mg total) by mouth daily. (Patient not taking: Reported on 05/13/2017), Disp: 30 tablet, Rfl: 11   Review of Systems  Constitutional: Negative for activity change, appetite change, chills, diaphoresis, fatigue and unexpected weight change.  HENT: Negative for facial swelling, rhinorrhea, sinus pressure, sneezing and trouble swallowing.   Eyes: Negative for photophobia and visual disturbance.  Respiratory: Negative for chest tightness and stridor.   Cardiovascular: Negative for palpitations and leg swelling.  Gastrointestinal: Positive for nausea. Negative for abdominal distention, anal bleeding, blood in stool and constipation.  Genitourinary: Negative for difficulty urinating, dysuria, flank pain and hematuria.  Musculoskeletal: Negative for arthralgias, back pain, gait problem, joint swelling and myalgias.  Skin: Negative for color change, pallor and wound.  Neurological: Positive for weakness, light-headedness and headaches. Negative for dizziness and tremors.  Hematological: Negative for adenopathy. Does not bruise/bleed easily.  Psychiatric/Behavioral: Negative for agitation, behavioral problems, confusion, decreased concentration, dysphoric mood and sleep disturbance.       Objective:   Physical Exam  Constitutional: She is oriented to person, place, and time. She appears well-developed and well-nourished. No distress.  HENT:  Head: Normocephalic and atraumatic.  Mouth/Throat: Oropharynx is clear and moist.  Eyes: Pupils are equal, round, and reactive to light. Conjunctivae and EOM are normal. No scleral icterus.  Neck:  Normal range of motion. Neck supple.  Cardiovascular: Normal rate, regular rhythm and normal heart sounds.  Exam reveals no gallop and no friction rub.   No murmur heard. Pulmonary/Chest: Effort normal and breath sounds normal. No respiratory distress. She has no wheezes. She has no rales. She exhibits no tenderness.  Abdominal: Soft. She exhibits no distension.  Musculoskeletal: She exhibits no edema or tenderness.  Neurological: She is alert and oriented to person, place, and time. She exhibits normal muscle tone. Coordination normal.  Skin: Skin is warm. She is not diaphoretic.  Psychiatric: She has a normal mood and affect. Her speech is slurred.       Assessment & Plan:   Exertional chest pain: this is concerning for angina (which she does not have NOW)  I will check EKG, troponin BNP and would like her seen by Cardiology if this can be done (she only has ADAP)   Her EKG was NSR, she had ? Q in III and AVF which were NOT new  I wiill have her start 81 mg Asa. I would LIKE Her to see Cardiology. p erhaps she can uses ADAP  program to purchase plan in [place] to allow [place] to get her seen by Cardiology. Certainly if CP returns and worsens she should go to ED  HIV/AIDS: Continue BIKTARVY  RENEW ADAP and will need advancing access  Hypertension: bp not terrible but again only on one drug  Vitals:   05/13/17 1518  BP: (!) 135/92  Pulse: 83  Temp: 98.3 F (36.8 C)  SpO2: 98%    Overweight: try lowcarbohydrate diet and exercise    I spent greater than 40 minutes with the patient including greater than 50% of time in face to face counsel of the patient re her exertional chest pain, cardiac risk factors, interpretation of her EKG, re her HIV, and in coordination of her care.

## 2017-05-14 LAB — TROPONIN I

## 2017-05-14 LAB — BRAIN NATRIURETIC PEPTIDE: Brain Natriuretic Peptide: 10 pg/mL (ref <100)

## 2017-05-16 ENCOUNTER — Encounter: Payer: Self-pay | Admitting: Infectious Disease

## 2017-08-01 ENCOUNTER — Ambulatory Visit: Payer: Self-pay | Admitting: Infectious Disease

## 2017-09-02 ENCOUNTER — Other Ambulatory Visit: Payer: Self-pay

## 2017-09-02 DIAGNOSIS — R0609 Other forms of dyspnea: Secondary | ICD-10-CM

## 2017-09-02 DIAGNOSIS — B20 Human immunodeficiency virus [HIV] disease: Secondary | ICD-10-CM

## 2017-09-02 DIAGNOSIS — Z23 Encounter for immunization: Secondary | ICD-10-CM

## 2017-09-02 DIAGNOSIS — Z79899 Other long term (current) drug therapy: Secondary | ICD-10-CM

## 2017-09-02 DIAGNOSIS — R079 Chest pain, unspecified: Secondary | ICD-10-CM

## 2017-09-02 DIAGNOSIS — Z113 Encounter for screening for infections with a predominantly sexual mode of transmission: Secondary | ICD-10-CM

## 2017-09-03 LAB — CBC WITH DIFFERENTIAL/PLATELET
Basophils Absolute: 63 cells/uL (ref 0–200)
Basophils Relative: 0.6 %
EOS ABS: 116 {cells}/uL (ref 15–500)
Eosinophils Relative: 1.1 %
HCT: 40.3 % (ref 35.0–45.0)
Hemoglobin: 13.5 g/dL (ref 11.7–15.5)
Lymphs Abs: 2583 cells/uL (ref 850–3900)
MCH: 27.6 pg (ref 27.0–33.0)
MCHC: 33.5 g/dL (ref 32.0–36.0)
MCV: 82.4 fL (ref 80.0–100.0)
MPV: 11 fL (ref 7.5–12.5)
Monocytes Relative: 9.5 %
Neutro Abs: 6741 cells/uL (ref 1500–7800)
Neutrophils Relative %: 64.2 %
Platelets: 335 10*3/uL (ref 140–400)
RBC: 4.89 10*6/uL (ref 3.80–5.10)
RDW: 15 % (ref 11.0–15.0)
TOTAL LYMPHOCYTE: 24.6 %
WBC: 10.5 10*3/uL (ref 3.8–10.8)
WBCMIX: 998 {cells}/uL — AB (ref 200–950)

## 2017-09-03 LAB — COMPLETE METABOLIC PANEL WITH GFR
AG RATIO: 1 (calc) (ref 1.0–2.5)
ALBUMIN MSPROF: 4.5 g/dL (ref 3.6–5.1)
ALKALINE PHOSPHATASE (APISO): 69 U/L (ref 33–115)
ALT: 19 U/L (ref 6–29)
AST: 19 U/L (ref 10–35)
BUN / CREAT RATIO: 26 (calc) — AB (ref 6–22)
BUN: 32 mg/dL — ABNORMAL HIGH (ref 7–25)
CO2: 26 mmol/L (ref 20–32)
CREATININE: 1.21 mg/dL — AB (ref 0.50–1.10)
Calcium: 10.4 mg/dL — ABNORMAL HIGH (ref 8.6–10.2)
Chloride: 102 mmol/L (ref 98–110)
GFR, EST NON AFRICAN AMERICAN: 53 mL/min/{1.73_m2} — AB (ref >=60)
GFR, Est African American: 62 mL/min/{1.73_m2} (ref >=60)
Globulin: 4.3 g/dL (calc) — ABNORMAL HIGH (ref 1.9–3.7)
Glucose, Bld: 90 mg/dL (ref 65–99)
POTASSIUM: 4.2 mmol/L (ref 3.5–5.3)
Sodium: 135 mmol/L (ref 135–146)
Total Bilirubin: 0.4 mg/dL (ref 0.2–1.2)
Total Protein: 8.8 g/dL — ABNORMAL HIGH (ref 6.1–8.1)

## 2017-09-03 LAB — RPR: RPR Ser Ql: NONREACTIVE

## 2017-09-03 LAB — LIPID PANEL
CHOLESTEROL: 203 mg/dL — AB (ref <200)
HDL: 49 mg/dL — AB (ref >50)
LDL CHOLESTEROL (CALC): 129 mg/dL — AB
Non-HDL Cholesterol (Calc): 154 mg/dL (calc) — ABNORMAL HIGH (ref <130)
TRIGLYCERIDES: 142 mg/dL (ref <150)
Total CHOL/HDL Ratio: 4.1 (calc) (ref <5.0)

## 2017-09-03 LAB — T-HELPER CELL (CD4) - (RCID CLINIC ONLY)
CD4 % Helper T Cell: 24 % — ABNORMAL LOW (ref 33–55)
CD4 T Cell Abs: 770 /uL (ref 400–2700)

## 2017-09-03 LAB — URINE CYTOLOGY ANCILLARY ONLY
Chlamydia: NEGATIVE
Neisseria Gonorrhea: NEGATIVE

## 2017-09-04 LAB — HIV-1 RNA QUANT-NO REFLEX-BLD
HIV 1 RNA QUANT: NOT DETECTED {copies}/mL
HIV-1 RNA Quant, Log: <1.3 Log copies/mL

## 2017-09-16 ENCOUNTER — Other Ambulatory Visit: Payer: Self-pay | Admitting: Infectious Disease

## 2017-09-16 ENCOUNTER — Ambulatory Visit (INDEPENDENT_AMBULATORY_CARE_PROVIDER_SITE_OTHER): Payer: Self-pay | Admitting: Infectious Disease

## 2017-09-16 ENCOUNTER — Other Ambulatory Visit: Payer: Self-pay

## 2017-09-16 ENCOUNTER — Ambulatory Visit (HOSPITAL_COMMUNITY)
Admission: RE | Admit: 2017-09-16 | Discharge: 2017-09-16 | Disposition: A | Discharge status: Home / Self Care | Payer: Self-pay | Source: Ambulatory Visit | Attending: Infectious Disease | Admitting: Infectious Disease

## 2017-09-16 ENCOUNTER — Ambulatory Visit: Payer: Self-pay | Admitting: Infectious Disease

## 2017-09-16 ENCOUNTER — Encounter: Payer: Self-pay | Admitting: Infectious Disease

## 2017-09-16 VITALS — BP 157/109 | HR 98 | Temp 98.5°F | Resp 22 | Ht 62.0 in | Wt 229.0 lb

## 2017-09-16 DIAGNOSIS — I10 Essential (primary) hypertension: Secondary | ICD-10-CM

## 2017-09-16 DIAGNOSIS — J069 Acute upper respiratory infection, unspecified: Secondary | ICD-10-CM

## 2017-09-16 DIAGNOSIS — Z79899 Other long term (current) drug therapy: Secondary | ICD-10-CM

## 2017-09-16 DIAGNOSIS — I517 Cardiomegaly: Secondary | ICD-10-CM | POA: Insufficient documentation

## 2017-09-16 DIAGNOSIS — B20 Human immunodeficiency virus [HIV] disease: Secondary | ICD-10-CM

## 2017-09-16 DIAGNOSIS — R9431 Abnormal electrocardiogram [ECG] [EKG]: Secondary | ICD-10-CM | POA: Insufficient documentation

## 2017-09-16 DIAGNOSIS — R0789 Other chest pain: Secondary | ICD-10-CM | POA: Insufficient documentation

## 2017-09-16 DIAGNOSIS — R079 Chest pain, unspecified: Secondary | ICD-10-CM

## 2017-09-16 DIAGNOSIS — Z113 Encounter for screening for infections with a predominantly sexual mode of transmission: Secondary | ICD-10-CM

## 2017-09-16 DIAGNOSIS — R0981 Nasal congestion: Secondary | ICD-10-CM

## 2017-09-16 HISTORY — DX: Acute upper respiratory infection, unspecified: J06.9

## 2017-09-16 LAB — BASIC METABOLIC PANEL WITH GFR
BUN: 18 mg/dL (ref 7–25)
CO2: 24 mmol/L (ref 20–32)
CREATININE: 0.91 mg/dL (ref 0.50–1.10)
Calcium: 9.6 mg/dL (ref 8.6–10.2)
Chloride: 104 mmol/L (ref 98–110)
GFR, EST NON AFRICAN AMERICAN: 75 mL/min/{1.73_m2} (ref >=60)
GFR, Est African American: 87 mL/min/{1.73_m2} (ref >=60)
Glucose, Bld: 95 mg/dL (ref 65–99)
Potassium: 3.9 mmol/L (ref 3.5–5.3)
Sodium: 137 mmol/L (ref 135–146)

## 2017-09-16 NOTE — Progress Notes (Signed)
Chief complaint is followup for HIV on meds who is yet again complaining of left sided chest pain  Subjective:    Patient ID: Monique [name], female    DOB: 03-May-1971, 47 y.o.   MRN: [id number]  HPI  47 year old African American lady with HIV AIDS ( August 2014) also with lung abscess . She was initially treated for both lung abscess and possibility of pulmonary tuberculosis. See prior notes for workup ultimately all data was negative for pulmonary tuberculosis and she did finish a course of therapy for latent tuberculosis.  She also had protracted Augmentin .   I  [facility] her [facility] and she had been withan undetectable viral load since healthy CD4 count. However at hervisit  In July 2015 her viral load was suddenly 28,124 copies. Her genotype showed no resistance and no increased resistance.We got her renewed on ADAP and she was again nicely suppressed ever since then.  She had been doing relatively well but her viral load has ticked up to 265 when checked in December on questioning her she admitted that she had missed at least 4 doses before her labs were taught taken and she may have missed even more since then. She stated  that the [facility] does make her feel a bit nauseous this does improve with time but it is a problem for her particular when she restarts the medications.  We had therefore changed her to [facility] and she remained suppressed. S \  At her last visit she had expressed concern re several episodes of angina These episodes had occurred with exertion and have lasted a few minutes responding to rest. Severity is 6/10 dull discomfort. No diaphoresis or nausea or vomiting.   She is smoking again. Today she came in c/o sinus congestion and difficulty breathing through her nose but also telling me that she is having chest pressure that developed on Friday when she exerted herself and only lasted a few minutes. It was on the left side without  radiation, diaphoresis, nausea. It resolved with rest. She also was awakened from sleep on Saturday with difficulty breathing and left sided chest pain that again lasted a few minutes. She again had similar pain today. Each episode has had accompanying tingling in her left hand and been 2/10 pressure like feeling  I ordered and reviewed  12 lead EKG which does show Q in III, AVF that is unchanged vs EKG in September, non pathogenic qs in II, LAE. There were NO changes and no ST or T wave changes.    Lab Results  Component Value Date   HIV1RNAQUANT <20 NOT DETECTED 09/02/2017   HIV1RNAQUANT <20 NOT DETECTED 03/04/2017   HIV1RNAQUANT <20 NOT DETECTED 02/06/2017      Lab Results  Component Value Date   CD4TABS 770 09/02/2017   CD4TABS 780 03/04/2017   CD4TABS 890 02/06/2017   She had insurance but lost this again and now on ADAP  She continues to suffer from nausea which as been particularly worse recently along with headaches. She is not taking HCTZ with her amlodipine.     Past Medical History:  Diagnosis Date  . Chest pressure 09/16/2017  . Exertional chest pain 05/13/2017  . Headache 02/06/2017  . HIV (human immunodeficiency virus infection) (HCC)   . Hypertension   . 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 Lower extremity edema 01/12/2015  . Lung abscess (HCC)   . Nausea 02/06/2017  . Overweight 01/12/2015  .  Pneumonia   . TB (tuberculosis)   . Tonsillitis     Past Surgical History:  Procedure Laterality Date  . CESAREAN SECTION    . VIDEO BRONCHOSCOPY Bilateral 04/13/2013   Procedure: VIDEO BRONCHOSCOPY WITHOUT FLUORO;  Surgeon: Kalman Shan, MD;  Location: [facility] ENDOSCOPY;  Service: Cardiopulmonary;  Laterality: Bilateral;    Family History  Problem Relation Age of Onset  . Diabetes Sister   . Hypertension Sister       Social History   Socioeconomic History  . Marital status: Single    Spouse name: None  . Number of  children: None  . Years of education: None  . Highest education level: None  Social Needs  . Financial resource strain: None  . Food insecurity - worry: None  . Food insecurity - inability: None  . Transportation needs - medical: None  . Transportation needs - non-medical: None  Occupational History  . None  Tobacco Use  . Smoking status: Never Smoker  . Smokeless tobacco: Never Used  Substance and Sexual Activity  . Alcohol use: No    Alcohol/week: 0.0 oz  . Drug use: No  . Sexual activity: Yes    Partners: Male  Other Topics Concern  . None  Social History Narrative  . None    No Known Allergies   Current Outpatient Medications:  .  amLODipine (NORVASC) 10 MG tablet, TAKE 1 TABLET(10 MG) BY MOUTH DAILY, Disp: 30 tablet, Rfl: 11 .  bictegravir-emtricitabine-tenofovir AF (BIKTARVY) 50-200-25 MG TABS tablet, Take 1 tablet by mouth daily., Disp: 30 tablet, Rfl: 11 .  hydrochlorothiazide (HYDRODIURIL) 25 MG tablet, Take 1 tablet (25 mg total) by mouth daily., Disp: 30 tablet, Rfl: 11  Current Facility-Administered Medications:  .  aspirin tablet 81 mg, 81 mg, Oral, Once, Daiva Eves, Lisette Grinder, MD   Review of Systems  Constitutional: Negative for activity change, appetite change, chills, diaphoresis, fatigue and unexpected weight change.  HENT: Positive for rhinorrhea and sinus pressure. Negative for facial swelling, sneezing and trouble swallowing.   Eyes: Negative for photophobia and visual disturbance.  Respiratory: Positive for cough, chest tightness and shortness of breath. Negative for stridor.   Cardiovascular: Positive for chest pain and palpitations. Negative for leg swelling.  Gastrointestinal: Positive for nausea. Negative for abdominal distention, anal bleeding, blood in stool and constipation.  Genitourinary: Negative for difficulty urinating, dysuria, flank pain and hematuria.  Musculoskeletal: Positive for myalgias. Negative for arthralgias, back pain, gait  problem and joint swelling.  Skin: Negative for color change, pallor and wound.  Neurological: Positive for weakness. Negative for dizziness, tremors, light-headedness and headaches.  Hematological: Negative for adenopathy. Does not bruise/bleed easily.  Psychiatric/Behavioral: Negative for agitation, behavioral problems, confusion, decreased concentration, dysphoric mood and sleep disturbance.       Objective:   Physical Exam  Constitutional: She is oriented to person, place, and time. She appears well-developed and well-nourished. No distress.  HENT:  Head: Normocephalic and atraumatic.  Mouth/Throat: Oropharynx is clear and moist.  Eyes: Conjunctivae and EOM are normal. Pupils are equal, round, and reactive to light. No scleral icterus.  Neck: Normal range of motion. Neck supple.  Cardiovascular: Normal rate, regular rhythm and normal heart sounds. Exam reveals no gallop and no friction rub.  No murmur heard. Pulmonary/Chest: Effort normal and breath sounds normal. No respiratory distress. She has no wheezes. She has no rales. She exhibits no tenderness.  Abdominal: Soft. Bowel sounds are normal. She exhibits no distension.  Musculoskeletal: She exhibits no  edema or tenderness.  Neurological: She is alert and oriented to person, place, and time. She exhibits normal muscle tone. Coordination normal.  Skin: Skin is warm. She is not diaphoretic.  Psychiatric: She has a normal mood and affect. Her behavior is normal. Judgment and thought content normal. Her speech is not slurred. Cognition and memory are normal.       Assessment & Plan:   Recurrent chest pain: it was esp exertional in September and now seems associated with her URI  I reviewed EKG as above. I also ordered a Troponin. I put in referral to Cardiology. Hopefully they can see her given that she lacks insurance.    HIV/AIDS: well controlled Continue BIKTARVY  RENEW ADAP and have   Hypertension: BP not optimal at all.  I will likely add a beta blocker though I really want her to be engaged with PCP as well  Vitals:   09/16/17 1526  BP: (!) 157/109  Pulse: 98  Resp: (!) 22  Temp: 98.5 F (36.9 C)  SpO2: 99%   URI: try afrin and flonase and avoid drugs taht would elevate BP  Overweight: try lowcarbohydrate diet when possible  Smoker: needs   I spent greater than 40 minutes with the patient including greater than 50% of time in face to face counsel of the patient re her chest pain, cardiac workup, HIV ARV and in coordination of her care.

## 2017-09-17 LAB — TROPONIN I: Troponin I: <0.01 ng/mL (ref <=0.0)

## 2017-10-10 ENCOUNTER — Other Ambulatory Visit: Payer: Self-pay | Admitting: Infectious Disease

## 2017-10-10 DIAGNOSIS — I10 Essential (primary) hypertension: Secondary | ICD-10-CM

## 2017-10-16 ENCOUNTER — Encounter: Payer: Self-pay | Admitting: Infectious Disease

## 2017-11-20 ENCOUNTER — Encounter: Payer: Self-pay | Admitting: Internal Medicine

## 2017-11-26 ENCOUNTER — Telehealth: Payer: Self-pay | Admitting: *Deleted

## 2017-11-26 NOTE — Telephone Encounter (Signed)
Patient called to report that she has had a chest cold for 5 days with fever, chills and general aches. Advised she has taken delsum and other OTC but nothing makes her feel better. She has now developed a wheeze and would like to be seen. Advised her if she is wheezing or having a hard time catching her breath she should go to an Urgent care or ED. She advised she doesn't have insurance and can not afford that so she wants to be seen here. Advised nothing today but we have appt on Friday with NP. She accepted the appt but advised her to please go to the ED if she starts to have shortness of breath or chest pain.

## 2017-11-29 ENCOUNTER — Encounter: Payer: Self-pay | Admitting: Infectious Diseases

## 2017-11-29 ENCOUNTER — Ambulatory Visit (INDEPENDENT_AMBULATORY_CARE_PROVIDER_SITE_OTHER): Payer: Self-pay | Admitting: Infectious Diseases

## 2017-11-29 DIAGNOSIS — R059 Cough, unspecified: Secondary | ICD-10-CM | POA: Insufficient documentation

## 2017-11-29 DIAGNOSIS — R05 Cough: Secondary | ICD-10-CM

## 2017-11-29 MED ORDER — AMOXICILLIN-POT CLAVULANATE 875-125 MG PO TABS: 1.0000 | ORAL_TABLET | Freq: Two times a day (BID) | ORAL | 0 refills | Status: AC

## 2017-11-29 MED ORDER — PREDNISONE 10 MG (21) PO TBPK: ORAL_TABLET | ORAL | 0 refills | Status: DC

## 2017-11-29 MED ORDER — ALBUTEROL SULFATE HFA 108 (90 BASE) MCG/ACT IN AERS: 2.0000 | INHALATION_SPRAY | Freq: Four times a day (QID) | RESPIRATORY_TRACT | 2 refills | Status: DC | PRN

## 2017-11-29 NOTE — Progress Notes (Signed)
Name: Monique [name]  DOB: May 03, 1971 MRN: [id number] PCP: Patient, No Pcp Per   Chief Complaint  Patient presents with  . Follow-up    wheezing, congestion, productive cough, "greenish" colored Mucus     Patient Active Problem List   Diagnosis Date Noted  . Cough and wheezing 11/29/2017  . Chest pressure 09/16/2017  . Viral URI 09/16/2017  . Exertional chest pain 05/13/2017  . Acute bilateral low back pain 02/13/2017  . Nasal congestion 02/13/2017  . Nausea 02/06/2017  . Headache 02/06/2017  . Obese 08/10/2015  . Essential hypertension 01/12/2015  . Lower extremity edema 01/12/2015  . Overweight 01/12/2015  . 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 DOE (dyspnea on exertion) 09/21/2014  . Ganglion cyst of flexor tendon sheath of finger of left hand 08/17/2014  . Normocytic anemia 05/24/2013  . Human immunodeficiency virus (HIV) disease (HCC) 04/10/2013  . Acute respiratory failure with hypoxia (HCC) 04/10/2013  . Acute kidney injury (HCC) 04/09/2013     Subjective:   Monique [name] is here today for a sick visit. She is a patient of Dr. Zenaida Niece Dam's with well controlled HIV maintained on BIktarvy.   She tells me that since she saw mea few months ago she has noticed some occasional wheezing. Over the last 10 days she developed some flu-like symptoms including fatigue, body aches and cough. At the time she continued to work but it wiped her out and she went straight to bed when she came home. She had some improvement with taking Delsym cough suppression but now since Thursday of last week she has had worsened cough productive of green sputum, sternal chest pain, wheezing, nasal congestion. She does have a history of asthma as a child but has not required inhalers since adult hood. She overall felt that she was improving but now feels she is getting worse again. Denies significant shortness of breath, orthopnea, fevers presently but did have  some chills over the last 2 days.   Review of Systems  Constitutional: Positive for chills and malaise/fatigue. Negative for fever.  HENT: Positive for congestion. Negative for sore throat and tinnitus.   Eyes: Negative for blurred vision and photophobia.  Respiratory: Positive for cough, sputum production and wheezing. Negative for shortness of breath.   Cardiovascular: Positive for chest pain. Negative for orthopnea.  Gastrointestinal: Negative for diarrhea, nausea and vomiting.  Genitourinary: Negative for dysuria.  Skin: Negative for rash.  Neurological: Negative for headaches.    Past Medical History:  Diagnosis Date  . Chest pressure 09/16/2017  . Exertional chest pain 05/13/2017  . Headache 02/06/2017  . HIV (human immunodeficiency virus infection) (HCC)   . Hypertension   . 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 Lower extremity edema 01/12/2015  . Lung abscess (HCC)   . Nausea 02/06/2017  . Overweight 01/12/2015  . Pneumonia   . TB (tuberculosis)   . Tonsillitis   . Viral URI 09/16/2017    Outpatient Medications Prior to Visit  Medication Sig Dispense Refill  . amLODipine (NORVASC) 10 MG tablet TAKE 1 TABLET(10 MG) BY MOUTH DAILY 30 tablet 3  . bictegravir-emtricitabine-tenofovir AF (BIKTARVY) 50-200-25 MG TABS tablet Take 1 tablet by mouth daily. 30 tablet 11  . hydrochlorothiazide (HYDRODIURIL) 25 MG tablet Take 1 tablet (25 mg total) by mouth daily. (Patient not taking: Reported on 11/29/2017) 30 tablet 11   Facility-Administered Medications Prior to Visit  Medication Dose Route Frequency Provider Last  Rate Last Dose  . aspirin tablet 81 mg  81 mg Oral Once Daiva EvesVan Dam, Lisette Grinderornelius N, MD         No Known Allergies  Social History   Tobacco Use  . Smoking status: Former Smoker    Last attempt to quit: 12/22/2013    Years since quitting: 3.9  . Smokeless tobacco: Never Used  Substance Use Topics  . Alcohol use: No     Alcohol/week: 0.0 oz  . Drug use: No    Family History  Problem Relation Age of Onset  . Diabetes Sister   . Hypertension Sister     Social History   Substance and Sexual Activity  Sexual Activity Yes  . Partners: Male     Objective:   Vitals:   11/29/17 1022  BP: 130/87  Pulse: 89  Resp: (!) 22  Temp: 98.2 F (36.8 C)  TempSrc: Oral  SpO2: 95%  Weight: 228 lb 12 oz (103.8 kg)  Height: 5\' 1"  (1.549 m)   Body mass index is 43.22 kg/m.  Physical Exam  Constitutional: She is oriented to person, place, and time.  Seated comfortably in chair, appears ill today and coughs during the entire visit.   HENT:  Right Ear: External ear normal.  Left Ear: External ear normal.  Mouth/Throat: Oropharynx is clear and moist.  Eyes: Pupils are equal, round, and reactive to light. Right eye exhibits no discharge. Left eye exhibits no discharge.  Cardiovascular: Normal rate, regular rhythm and normal heart sounds.  No murmur heard. Pulmonary/Chest: Effort normal. No accessory muscle usage. Tachypnea noted. No respiratory distress. She has wheezes. She has rales (left lower lobe).  Abdominal: Soft. She exhibits no distension.  Lymphadenopathy:    She has no cervical adenopathy.  Neurological: She is alert and oriented to person, place, and time.  Skin: Skin is warm and dry.  Psychiatric: Memory and affect normal.  Vitals reviewed.   Lab Results  HIV 1 RNA Quant (copies/mL)  Date Value  09/02/2017 <20 NOT DETECTED  03/04/2017 <20 NOT DETECTED  02/06/2017 <20 NOT DETECTED   CD4 T Cell Abs (/uL)  Date Value  09/02/2017 770  03/04/2017 780  02/06/2017 890    Assessment & Plan:   Problem List Items Addressed This Visit      Other   Cough and wheezing    Patient with significant bilateral wheezing, productive cough and rales heard left lower lobe concerning for pneumonia following viral illness. She is not hypoxic and has stable vital signs --> Will treat with  Augmentin.  Defer chest x-ray today as I would prefer her to pay for albuterol inhaler considering her significant wheezing today and not able to afford both. She likely needs ongoing rx for albuterol rescue inhaler considering h/o asthma as a child and mentioning she has had intermittent wheezing since she saw me a few months ago - will include refills.  Will also send in rx for prednisone taper.  Discussed further over the counter remedies for symptom control.  Advised to call back Monday if she has no improvement in symptoms or present to urgent care if shortness of breath despite the above measures.          Rexene AlbertsStephanie Yvonda Fouty, MSN, NP-C [facility] Pager: [phone] Office: [phone]  11/29/17  12:07 PM

## 2017-11-29 NOTE — Patient Instructions (Addendum)
Would try some over the counter sudafed (12-hour kind) and take this twice a day to help with nasal congestion.   Continue your Delsym-DM.   OK to take some ibuprofen or Aleve if tylenol does not help you pain.   Will send in an antibiotic called Augmentin.   Will send in prednisone for you - take 6 pills today, 5 pills tomorrow, 4-3-2-1   Will also send in an inhaler for you to use as needed to help with wheezing.   Please call us back Monday if you are not feeling better.

## 2017-11-29 NOTE — Assessment & Plan Note (Addendum)
Patient with significant bilateral wheezing, productive cough and rales heard left lower lobe concerning for pneumonia following viral illness. She is not hypoxic and has stable vital signs --> Will treat with Augmentin.  Defer chest x-ray today as I would prefer her to pay for albuterol inhaler considering her significant wheezing today and not able to afford both. She likely needs ongoing rx for albuterol rescue inhaler considering h/o asthma as a child and mentioning she has had intermittent wheezing since she saw me a few months ago - will include refills.  Will also send in rx for prednisone taper.  Discussed further over the counter remedies for symptom control.  Advised to call back Monday if she has no improvement in symptoms or present to urgent care if shortness of breath despite the above measures.

## 2017-12-02 ENCOUNTER — Ambulatory Visit: Payer: Self-pay | Admitting: Internal Medicine

## 2017-12-02 NOTE — Progress Notes (Deleted)
New Outpatient Visit Date: 12/02/2017  Referring Provider: Daiva Eves, Lisette Grinder, MD [address] [place], [place] [address]  Chief Complaint: ***  HPI:  Monique [name] is a 47 y.o. female who is being seen today for the evaluation of chest pain at the request of Dr. Daiva Eves. She has a history of HIV, hypertension, pulmonary abscess, and latent tuberculosis. ***  --------------------------------------------------------------------------------------------------  Cardiovascular History & Procedures: Cardiovascular Problems:  Chest pain  Risk Factors:  Hypertension, HIV, and morbid obesity  Cath/PCI:  None  CV Surgery:  None  EP Procedures and Devices:  None  Non-Invasive Evaluation(s):  Bilateral lower extremity venous duplex (04/28/14): No DVT or Baker's cyst.  Recent CV Pertinent Labs: Lab Results  Component Value Date   CHOL 203 (H) 09/02/2017   HDL 49 (L) 09/02/2017   LDLCALC 129 (H) 09/02/2017   TRIG 142 09/02/2017   CHOLHDL 4.1 09/02/2017   INR 1.29 04/17/2013   BNP 10 05/13/2017   K 3.9 09/16/2017   MG 1.6 04/09/2013   BUN 18 09/16/2017   CREATININE 0.91 09/16/2017    --------------------------------------------------------------------------------------------------  Past Medical History:  Diagnosis Date  . Chest pressure 09/16/2017  . Exertional chest pain 05/13/2017  . Headache 02/06/2017  . HIV (human immunodeficiency virus infection) (HCC)   . Hypertension   . 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 Lower extremity edema 01/12/2015  . Lung abscess (HCC)   . Nausea 02/06/2017  . Overweight 01/12/2015  . Pneumonia   . TB (tuberculosis)   . Tonsillitis   . Viral URI 09/16/2017    Past Surgical History:  Procedure Laterality Date  . CESAREAN SECTION    . VIDEO BRONCHOSCOPY Bilateral 04/13/2013   Procedure: VIDEO BRONCHOSCOPY WITHOUT FLUORO;  Surgeon: Kalman Shan, MD;  Location: [facility] ENDOSCOPY;   Service: Cardiopulmonary;  Laterality: Bilateral;    No outpatient medications have been marked as taking for the 12/02/17 encounter (Appointment) with Bostyn Kunkler, Cristal Deer, MD.   Current Facility-Administered Medications for the 12/02/17 encounter (Appointment) with Romello Hoehn, Cristal Deer, MD  Medication  . aspirin tablet 81 mg    Allergies: Patient has no known allergies.  Social History   Socioeconomic History  . Marital status: Single    Spouse name: Not on file  . Number of children: Not on file  . Years of education: Not on file  . Highest education level: Not on file  Occupational History  . Not on file  Social Needs  . Financial resource strain: Not on file  . Food insecurity:    Worry: Not on file    Inability: Not on file  . Transportation needs:    Medical: Not on file    Non-medical: Not on file  Tobacco Use  . Smoking status: Former Smoker    Last attempt to quit: 12/22/2013    Years since quitting: 3.9  . Smokeless tobacco: Never Used  Substance and Sexual Activity  . Alcohol use: No    Alcohol/week: 0.0 oz  . Drug use: No  . Sexual activity: Yes    Partners: Male  Lifestyle  . Physical activity:    Days per week: Not on file    Minutes per session: Not on file  . Stress: Not on file  Relationships  . Social connections:    Talks on phone: Not on file    Gets together: Not on file    Attends religious service: Not on file    Active member of  club or organization: Not on file    Attends meetings of clubs or organizations: Not on file    Relationship status: Not on file  . Intimate partner violence:    Fear of current or ex partner: Not on file    Emotionally abused: Not on file    Physically abused: Not on file    Forced sexual activity: Not on file  Other Topics Concern  . Not on file  Social History Narrative  . Not on file    Family History  Problem Relation Age of Onset  . Diabetes Sister   . Hypertension Sister     Review of Systems: A  12-system review of systems was performed and was negative except as noted in the HPI.  --------------------------------------------------------------------------------------------------  Physical Exam: LMP 11/21/2017   General:  *** HEENT: No conjunctival pallor or scleral icterus. Moist mucous membranes. OP clear. Neck: Supple without lymphadenopathy, thyromegaly, JVD, or HJR. No carotid bruit. Lungs: Normal work of breathing. Clear to auscultation bilaterally without wheezes or crackles. Heart: Regular rate and rhythm without murmurs, rubs, or gallops. Non-displaced PMI. Abd: Bowel sounds present. Soft, NT/ND without hepatosplenomegaly Ext: No lower extremity edema. Radial, PT, and DP pulses are 2+ bilaterally Skin: Warm and dry without rash. Neuro: CNIII-XII intact. Strength and fine-touch sensation intact in upper and lower extremities bilaterally. Psych: Normal mood and affect.  EKG:  ***  Lab Results  Component Value Date   WBC 10.5 09/02/2017   HGB 13.5 09/02/2017   HCT 40.3 09/02/2017   MCV 82.4 09/02/2017   PLT 335 09/02/2017    Lab Results  Component Value Date   NA 137 09/16/2017   K 3.9 09/16/2017   CL 104 09/16/2017   CO2 24 09/16/2017   BUN 18 09/16/2017   CREATININE 0.91 09/16/2017   GLUCOSE 95 09/16/2017   ALT 19 09/02/2017    Lab Results  Component Value Date   CHOL 203 (H) 09/02/2017   HDL 49 (L) 09/02/2017   LDLCALC 129 (H) 09/02/2017   TRIG 142 09/02/2017   CHOLHDL 4.1 09/02/2017     --------------------------------------------------------------------------------------------------  ASSESSMENT AND PLAN: Cristal Deer***  Yamaris Cummings, MD 12/02/2017 7:52 AM

## 2017-12-03 ENCOUNTER — Encounter: Payer: Self-pay | Admitting: Internal Medicine

## 2018-02-05 ENCOUNTER — Other Ambulatory Visit: Payer: Self-pay

## 2018-02-05 ENCOUNTER — Ambulatory Visit (INDEPENDENT_AMBULATORY_CARE_PROVIDER_SITE_OTHER): Payer: Self-pay

## 2018-02-05 ENCOUNTER — Telehealth: Payer: Self-pay

## 2018-02-05 ENCOUNTER — Ambulatory Visit (HOSPITAL_COMMUNITY)
Admission: EM | Admit: 2018-02-05 | Discharge: 2018-02-05 | Disposition: A | Discharge status: Home / Self Care | Payer: Self-pay | Attending: Family Medicine | Admitting: Family Medicine

## 2018-02-05 ENCOUNTER — Encounter (HOSPITAL_COMMUNITY): Payer: Self-pay | Admitting: Emergency Medicine

## 2018-02-05 DIAGNOSIS — R0682 Tachypnea, not elsewhere classified: Secondary | ICD-10-CM

## 2018-02-05 DIAGNOSIS — R070 Pain in throat: Secondary | ICD-10-CM

## 2018-02-05 DIAGNOSIS — I10 Essential (primary) hypertension: Secondary | ICD-10-CM | POA: Insufficient documentation

## 2018-02-05 DIAGNOSIS — R509 Fever, unspecified: Secondary | ICD-10-CM

## 2018-02-05 DIAGNOSIS — Z21 Asymptomatic human immunodeficiency virus [HIV] infection status: Secondary | ICD-10-CM | POA: Insufficient documentation

## 2018-02-05 DIAGNOSIS — B20 Human immunodeficiency virus [HIV] disease: Secondary | ICD-10-CM

## 2018-02-05 DIAGNOSIS — Z79899 Other long term (current) drug therapy: Secondary | ICD-10-CM | POA: Insufficient documentation

## 2018-02-05 DIAGNOSIS — J029 Acute pharyngitis, unspecified: Secondary | ICD-10-CM | POA: Insufficient documentation

## 2018-02-05 DIAGNOSIS — J4520 Mild intermittent asthma, uncomplicated: Secondary | ICD-10-CM | POA: Insufficient documentation

## 2018-02-05 DIAGNOSIS — R05 Cough: Secondary | ICD-10-CM

## 2018-02-05 DIAGNOSIS — Z7982 Long term (current) use of aspirin: Secondary | ICD-10-CM | POA: Insufficient documentation

## 2018-02-05 LAB — POCT RAPID STREP A: STREPTOCOCCUS, GROUP A SCREEN (DIRECT): NEGATIVE

## 2018-02-05 MED ORDER — PENICILLIN G BENZATHINE 1200000 UNIT/2ML IM SUSP
INTRAMUSCULAR | Status: AC
  Filled 2018-02-05: qty 2

## 2018-02-05 MED ORDER — PENICILLIN G BENZATHINE 1200000 UNIT/2ML IM SUSP
1.2000 10*6.[IU] | Freq: Once | INTRAMUSCULAR | Status: AC
  Administered 2018-02-05: 1.2 10*6.[IU] via INTRAMUSCULAR

## 2018-02-05 MED ORDER — ACETAMINOPHEN 160 MG/5ML PO SOLN
ORAL | Status: AC
  Filled 2018-02-05: qty 20.3

## 2018-02-05 MED ORDER — HYDROCODONE-HOMATROPINE 5-1.5 MG/5ML PO SYRP: 5.0000 mL | ORAL_SOLUTION | Freq: Every evening | ORAL | 0 refills | Status: DC | PRN

## 2018-02-05 MED ORDER — CETIRIZINE HCL 10 MG PO TABS: 10.0000 mg | ORAL_TABLET | Freq: Every day | ORAL | 0 refills | Status: DC

## 2018-02-05 MED ORDER — ACETAMINOPHEN 325 MG PO TABS
650.0000 mg | ORAL_TABLET | Freq: Once | ORAL | Status: AC
  Administered 2018-02-05: 650 mg via ORAL

## 2018-02-05 NOTE — Telephone Encounter (Signed)
PT left a vm for triage stating she needed to be seen soon at our office. Stated she has been having chills. Pt did not leave any other complaints or symptoms. Requested a call back. Attempted to call pt there was no answer; unable to leave vm due to vm not being set up. Lorenso CourierJose L Maldonado, [place]

## 2018-02-05 NOTE — ED Triage Notes (Signed)
Sore throat, chills, fever-all started last night

## 2018-02-05 NOTE — ED Provider Notes (Addendum)
MRN: [id number] DOB: Jun 13, 1971  Subjective:   Monique [name] is a 47 y.o. female presenting for 1 day history of progressively worsening sore throat, difficulty swallowing, sinus congestion, chills, left sided abdominal pain. Belly pain is intermittent, mild-moderate, symptoms started after lifting really heavy item at work, weight approximately 50 pounds.  States that she feels like she pulled something, she does not have active belly pain. Has tried Advil and Robitussin with minimal relief. Denies sinus pain, ear pain, cough, chest pain, shob, wheezing, n/v, rashes. Denies smoking cigarettes. Denies history of chest PE, dvt, MI. Has a history of acute respiratory failure with hypoxia in 2014. Has not had a recurrence since then.  Reports history of asthma.  She had an asthma flare approximately 1 month ago, had very similar symptoms and was given a steroid course that helped resolve her symptoms.  Patient is HIV positive and is on Biktarvy for this.  Her last CD4 counts were in September 15, 2017, the absolute count was at 770, the percent helper T-cell was at 24% down from 30% 11 months ago.   Current Facility-Administered Medications:  .  aspirin tablet 81 mg, 81 mg, Oral, Once, Daiva EvesVan Dam, Lisette Grinderornelius N, MD  Current Outpatient Medications:  .  albuterol (PROVENTIL HFA;VENTOLIN HFA) 108 (90 Base) MCG/ACT inhaler, Inhale 2 puffs into the lungs every 6 (six) hours as needed for wheezing or shortness of breath., Disp: 1 Inhaler, Rfl: 2 .  amLODipine (NORVASC) 10 MG tablet, TAKE 1 TABLET(10 MG) BY MOUTH DAILY, Disp: 30 tablet, Rfl: 3 .  bictegravir-emtricitabine-tenofovir AF (BIKTARVY) 50-200-25 MG TABS tablet, Take 1 tablet by mouth daily., Disp: 30 tablet, Rfl: 11 .  hydrochlorothiazide (HYDRODIURIL) 25 MG tablet, Take 1 tablet (25 mg total) by mouth daily., Disp: 30 tablet, Rfl: 11    No Known Allergies   Past Medical History:  Diagnosis Date  . Chest pressure 09/16/2017  . Exertional chest  pain 05/13/2017  . Headache 02/06/2017  . HIV (human immunodeficiency virus infection) (HCC)   . Hypertension   . 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 Lower extremity edema 01/12/2015  . Lung abscess (HCC)   . Nausea 02/06/2017  . Overweight 01/12/2015  . Pneumonia   . TB (tuberculosis)   . Tonsillitis   . Viral URI 09/16/2017     Past Surgical History:  Procedure Laterality Date  . CESAREAN SECTION    . VIDEO BRONCHOSCOPY Bilateral 04/13/2013   Procedure: VIDEO BRONCHOSCOPY WITHOUT FLUORO;  Surgeon: Kalman ShanMurali Ramaswamy, MD;  [facility];  Service: Cardiopulmonary;  Laterality: Bilateral;    Objective:   Vitals: BP 117/67 (BP Location: Right Arm)   Pulse (!) 121   Temp (!) 103 F (39.4 C) (Oral)   Resp (!) 28   LMP 01/22/2018 (Exact Date)   SpO2 96%   Respirations 24 on recheck by PA-Jeanae Whitmill at 18:11.  Pulse was 121, pulse oximetry at 97% on recheck by RN Hutchins.  Physical Exam  Constitutional: She is oriented to person, place, and time. She appears well-developed and well-nourished.  HENT:  Mouth/Throat: Oropharynx is clear and moist.  Eyes: Right eye exhibits no discharge. Left eye exhibits no discharge. No scleral icterus.  Neck: Normal range of motion. Neck supple.  Bilateral cervical lymph node pain without adenopathy. Also has submandibular lymph node pain, right worse than left.   Cardiovascular: Normal rate, regular rhythm and intact distal pulses. Exam reveals no gallop and no friction rub.  No murmur  heard. Pulmonary/Chest: No stridor. No respiratory distress. She has no wheezes. She has no rales.  Lymphadenopathy:    She has no cervical adenopathy.  Neurological: She is alert and oriented to person, place, and time.  Skin: Skin is warm and dry.  Psychiatric: She has a normal mood and affect.    Dg Chest 2 View  Result Date: 02/05/2018 CLINICAL DATA:  Cough and fever beginning yesterday. Kidney a. Asthma.  EXAM: CHEST - 2 VIEW COMPARISON:  09/21/2014 FINDINGS: The heart size and mediastinal contours are within normal limits. Pleural-parenchymal scarring in the left upper lobe is stable. No evidence of acute pulmonary infiltrate or edema. No evidence of pleural effusion. The visualized skeletal structures are unremarkable. IMPRESSION: Stable left upper lobe scarring.  No active cardiopulmonary disease. Electronically Signed   By: Myles Rosenthal M.D.   On: 02/05/2018 18:03    Results for orders placed or performed during the hospital encounter of 02/05/18 (from the past 24 hour(s))  POCT rapid strep A [facility])     Status: None   Collection Time: 02/05/18  5:33 PM  Result Value Ref Range   Streptococcus, Group A Screen (Direct) NEGATIVE NEGATIVE    Assessment and Plan :   Acute pharyngitis, unspecified etiology  Throat pain  HIV disease (HCC)  Mild intermittent asthma without complication  Fever, unspecified  Will use injection of penicillin today to address pharyngitis.  Patient denies any respiratory or chest symptoms.  Her x-ray is very reassuring.  I recommend that she schedule albuterol inhaler to see if that helps with the tachypnea.  She is also to schedule Tylenol and ibuprofen at home.  Offered patient Hycodan syrup to help with her throat pain and difficulty swallowing so the patient can hydrate and eat well.  Patient is to use Zyrtec and Sudafed to address her nasal congestion.  Patient will follow-up tomorrow. Counseled patient on potential for adverse effects with medications prescribed today, patient verbalized understanding.      Wallis Bamberg, [place] 02/05/18 1610

## 2018-02-05 NOTE — Discharge Instructions (Addendum)
You may take 500mg  Tylenol with ibuprofen 600mg  every 6 hours for pain and inflammation. Schedule your inhaler twice daily at least 6 hours apart. Hydrate well with at least 2 liters (1 gallon) of water daily. Take Zyrtec to address the nasal congestion.

## 2018-02-05 NOTE — ED Notes (Signed)
Sample in lab 

## 2018-02-06 ENCOUNTER — Ambulatory Visit (HOSPITAL_COMMUNITY)
Admission: EM | Admit: 2018-02-06 | Discharge: 2018-02-06 | Disposition: A | Discharge status: Home / Self Care | Payer: Self-pay | Attending: Family Medicine | Admitting: Family Medicine

## 2018-02-06 ENCOUNTER — Encounter (HOSPITAL_COMMUNITY): Payer: Self-pay

## 2018-02-06 DIAGNOSIS — J029 Acute pharyngitis, unspecified: Secondary | ICD-10-CM

## 2018-02-06 DIAGNOSIS — R6883 Chills (without fever): Secondary | ICD-10-CM

## 2018-02-06 DIAGNOSIS — J039 Acute tonsillitis, unspecified: Secondary | ICD-10-CM

## 2018-02-06 LAB — POCT INFECTIOUS MONO SCREEN: Mono Screen: NEGATIVE

## 2018-02-06 MED ORDER — ACETAMINOPHEN 500 MG PO TABS: 1000.0000 mg | ORAL_TABLET | Freq: Three times a day (TID) | ORAL | 0 refills | Status: DC | PRN

## 2018-02-06 MED ORDER — ACETAMINOPHEN 325 MG PO TABS
975.0000 mg | ORAL_TABLET | Freq: Once | ORAL | Status: AC
  Administered 2018-02-06: 975 mg via ORAL

## 2018-02-06 MED ORDER — ACETAMINOPHEN 325 MG PO TABS
ORAL_TABLET | ORAL | Status: AC
  Filled 2018-02-06: qty 3

## 2018-02-06 NOTE — Discharge Instructions (Signed)
It is reassuring that you are feeling better after treatment provided yesterday. Continue to push fluids. Rest. Tylenol or ibuprofen for pain control. Inhaler as needed.  If worsening please go to Er.  If no improvement in the next 3-5 days return here or follow with your primary care provider.

## 2018-02-06 NOTE — ED Provider Notes (Signed)
[facility]    CSN: [id number] Arrival date & time: 02/06/18  1413     History   Chief Complaint Chief Complaint  Patient presents with  . Sore Throat Appt 1422    HPI CELES DEDIC is a 47 y.o. female.   Monique [name] presents for recheck. Was seen in clinic yesterday with fever, sore throat, cough, congestion, difficulty swallowing. She was febrile and tachycardic. Chest xray was obtained without acute findings. Negative rapid strep. Was given pen g 1.73million units im. Patient states her symptoms had started two days ago. Today she reports that she feels much improved. She is able to take fluids now. Used provided cough syrup which helped her sleep. She has a productive cough of mucus. Denies any shortness of breath . Has not taken any antipyretic today. Has not used albuterol. Denies nausea or vomiting. No known ill contacts. Hx of hiv, tn, pnea, tb, tonsilitis.    ROS per HPI.      Past Medical History:  Diagnosis Date  . Chest pressure 09/16/2017  . Exertional chest pain 05/13/2017  . Headache 02/06/2017  . HIV (human immunodeficiency virus infection) (HCC)   . Hypertension   . 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 Lower extremity edema 01/12/2015  . Lung abscess (HCC)   . Nausea 02/06/2017  . Overweight 01/12/2015  . Pneumonia   . TB (tuberculosis)   . Tonsillitis   . Viral URI 09/16/2017    Patient Active Problem List   Diagnosis Date Noted  . Cough and wheezing 11/29/2017  . Chest pressure 09/16/2017  . Viral URI 09/16/2017  . Exertional chest pain 05/13/2017  . Acute bilateral low back pain 02/13/2017  . Nasal congestion 02/13/2017  . Nausea 02/06/2017  . Headache 02/06/2017  . Obese 08/10/2015  . Essential hypertension 01/12/2015  . Lower extremity edema 01/12/2015  . Overweight 01/12/2015  . 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 DOE (dyspnea on exertion)  09/21/2014  . Ganglion cyst of flexor tendon sheath of finger of left hand 08/17/2014  . Normocytic anemia 05/24/2013  . Human immunodeficiency virus (HIV) disease (HCC) 04/10/2013  . Acute respiratory failure with hypoxia (HCC) 04/10/2013  . Acute kidney injury (HCC) 04/09/2013    Past Surgical History:  Procedure Laterality Date  . CESAREAN SECTION    . VIDEO BRONCHOSCOPY Bilateral 04/13/2013   Procedure: VIDEO BRONCHOSCOPY WITHOUT FLUORO;  Surgeon: Kalman Shan, MD;  Location: [facility];  Service: Cardiopulmonary;  Laterality: Bilateral;    OB History   None      Home Medications    Prior to Admission medications   Medication Sig Start Date End Date Taking? Authorizing Provider  albuterol (PROVENTIL HFA;VENTOLIN HFA) 108 (90 Base) MCG/ACT inhaler Inhale 2 puffs into the lungs every 6 (six) hours as needed for wheezing or shortness of breath. 11/29/17  Yes Blanchard Kelch, NP  amLODipine (NORVASC) 10 MG tablet TAKE 1 TABLET(10 MG) BY MOUTH DAILY 10/10/17  Yes Daiva Eves, Lisette Grinder, MD  bictegravir-emtricitabine-tenofovir AF (BIKTARVY) 50-200-25 MG TABS tablet Take 1 tablet by mouth daily. 02/06/17  Yes Daiva Eves, Lisette Grinder, MD  cetirizine (ZYRTEC ALLERGY) 10 MG tablet Take 1 tablet (10 mg total) by mouth daily. 02/05/18  Yes Wallis Bamberg, PA-C  hydrochlorothiazide (HYDRODIURIL) 25 MG tablet Take 1 tablet (25 mg total) by mouth daily. 05/13/17  Yes Daiva Eves, Lisette Grinder, MD  HYDROcodone-homatropine [facility]) 5-1.5 MG/5ML syrup Take 5  mLs by mouth at bedtime as needed. 02/05/18  Yes Wallis BambergMani, Mario, PA-C  acetaminophen (TYLENOL) 500 MG tablet Take 2 tablets (1,000 mg total) by mouth every 8 (eight) hours as needed for mild pain or fever. 02/06/18   Georgetta HaberBurky, Harrell Niehoff B, NP    Family History Family History  Problem Relation Age of Onset  . Diabetes Sister   . Hypertension Sister     Social History Social History   Tobacco Use  . Smoking status: Former Smoker    Last attempt to  quit: 12/22/2013    Years since quitting: 4.1  . Smokeless tobacco: Never Used  Substance Use Topics  . Alcohol use: No    Alcohol/week: 0.0 oz  . Drug use: No     Allergies   Patient has no known allergies.   Review of Systems Review of Systems   Physical Exam Triage Vital Signs ED Triage Vitals  Enc Vitals Group     BP 02/06/18 1424 (!) 167/84     Pulse Rate 02/06/18 1424 (!) 116     Resp 02/06/18 1424 18     Temp 02/06/18 1424 (!) 102.7 F (39.3 C)     Temp src --      SpO2 02/06/18 1424 95 %     Weight --      Height --      Head Circumference --      Peak Flow --      Pain Score 02/06/18 1425 6     Pain Loc --      Pain Edu? --      Excl. in GC? --    No data found.  Updated Vital Signs BP 109/69   Pulse (!) 116   Temp (!) 100.4 F (38 C) (Oral)   Resp 19   LMP 01/22/2018 (Exact Date)   SpO2 95%    Physical Exam  Constitutional: She is oriented to person, place, and time. She appears well-developed and well-nourished. No distress.  HENT:  Head: Normocephalic and atraumatic.  Right Ear: Tympanic membrane, external ear and ear canal normal.  Left Ear: Tympanic membrane, external ear and ear canal normal.  Nose: Nose normal.  Mouth/Throat: Uvula is midline, oropharynx is clear and moist and mucous membranes are normal. Tonsils are 2+ on the right. Tonsils are 2+ on the left. Tonsillar exudate.  Eyes: Pupils are equal, round, and reactive to light. Conjunctivae and EOM are normal.  Cardiovascular: Normal rate, regular rhythm and normal heart sounds.  Pulmonary/Chest: Effort normal and breath sounds normal.  Coarse lung sounds which improve with cough; productive of green sputum   Lymphadenopathy:    She has cervical adenopathy.  Neurological: She is alert and oriented to person, place, and time.  Skin: Skin is warm and dry.     UC Treatments / Results  Labs (all labs ordered are listed, but only abnormal results are displayed) Labs Reviewed    POCT INFECTIOUS MONO SCREEN    EKG None  Radiology Dg Chest 2 View  Result Date: 02/05/2018 CLINICAL DATA:  Cough and fever beginning yesterday. Kidney a. Asthma. EXAM: CHEST - 2 VIEW COMPARISON:  09/21/2014 FINDINGS: The heart size and mediastinal contours are within normal limits. Pleural-parenchymal scarring in the left upper lobe is stable. No evidence of acute pulmonary infiltrate or edema. No evidence of pleural effusion. The visualized skeletal structures are unremarkable. IMPRESSION: Stable left upper lobe scarring.  No active cardiopulmonary disease. Electronically Signed   By: Myles RosenthalJohn  Stahl  M.D.   On: 02/05/2018 18:03    Procedures Procedures (including critical care time)  Medications Ordered in UC Medications  acetaminophen (TYLENOL) tablet 975 mg (975 mg Oral Given 02/06/18 1441)    Initial Impression / Assessment and Plan / UC Course  I have reviewed the triage vital signs and the nursing notes.  Pertinent labs & imaging results that were available during my care of the patient were reviewed by me and considered in my medical decision making (see chart for details).     Patient states she feels much improved since yesterday. Still with fever and tachycardia, had not taken any antipyretic. Per nursing staff who saw patient yesterday at visit patient appears much improved. Still with quite significant tonsils. Received Pen g yesterday. Negative mono, strep culture still pending. Tylenol given. Improvement in temp and tachycardia. HR 102 at time of discharge, patient continues to state she feels much improved. Appears likely to have been illness susceptible to Penicillin. Continue with supportive cares. Return precautions provided. Patient verbalized understanding and agreeable to plan.  Ambulatory out of clinic without difficulty.    Final Clinical Impressions(s) / UC Diagnoses   Final diagnoses:  Acute tonsillitis, unspecified etiology     Discharge Instructions      It is reassuring that you are feeling better after treatment provided yesterday. Continue to push fluids. Rest. Tylenol or ibuprofen for pain control. Inhaler as needed.  If worsening please go to Er.  If no improvement in the next 3-5 days return here or follow with your primary care provider.     ED Prescriptions    Medication Sig Dispense Auth. Provider   acetaminophen (TYLENOL) 500 MG tablet Take 2 tablets (1,000 mg total) by mouth every 8 (eight) hours as needed for mild pain or fever. 30 tablet Georgetta Haber, NP     Controlled Substance Prescriptions [place] Controlled Substance Registry consulted? Not Applicable   Georgetta Haber, NP 02/06/18 1545

## 2018-02-06 NOTE — ED Triage Notes (Signed)
Pt presents with sore throat and fever x 2 days. 

## 2018-02-06 NOTE — ED Notes (Signed)
Bed: UC01 Expected date:  Expected time:  Means of arrival:  Comments: 

## 2018-02-07 LAB — CULTURE, GROUP A STREP (THRC)

## 2018-03-01 ENCOUNTER — Other Ambulatory Visit: Payer: Self-pay | Admitting: Infectious Disease

## 2018-03-04 ENCOUNTER — Ambulatory Visit: Payer: Self-pay

## 2018-03-04 ENCOUNTER — Other Ambulatory Visit: Payer: Self-pay

## 2018-03-04 DIAGNOSIS — B20 Human immunodeficiency virus [HIV] disease: Secondary | ICD-10-CM

## 2018-03-04 DIAGNOSIS — R0981 Nasal congestion: Secondary | ICD-10-CM

## 2018-03-04 DIAGNOSIS — I10 Essential (primary) hypertension: Secondary | ICD-10-CM

## 2018-03-04 DIAGNOSIS — Z113 Encounter for screening for infections with a predominantly sexual mode of transmission: Secondary | ICD-10-CM

## 2018-03-04 DIAGNOSIS — R079 Chest pain, unspecified: Secondary | ICD-10-CM

## 2018-03-04 DIAGNOSIS — Z79899 Other long term (current) drug therapy: Secondary | ICD-10-CM

## 2018-03-05 LAB — LIPID PANEL
Cholesterol: 166 mg/dL (ref <200)
HDL: 43 mg/dL — AB (ref >50)
LDL CHOLESTEROL (CALC): 97 mg/dL
NON-HDL CHOLESTEROL (CALC): 123 mg/dL (ref <130)
TRIGLYCERIDES: 166 mg/dL — AB (ref <150)
Total CHOL/HDL Ratio: 3.9 (calc) (ref <5.0)

## 2018-03-05 LAB — COMPLETE METABOLIC PANEL WITH GFR
AG RATIO: 1.1 (calc) (ref 1.0–2.5)
ALKALINE PHOSPHATASE (APISO): 71 U/L (ref 33–115)
ALT: 31 U/L — AB (ref 6–29)
AST: 21 U/L (ref 10–35)
Albumin: 4 g/dL (ref 3.6–5.1)
BUN: 17 mg/dL (ref 7–25)
CO2: 23 mmol/L (ref 20–32)
Calcium: 9.6 mg/dL (ref 8.6–10.2)
Chloride: 107 mmol/L (ref 98–110)
Creat: 0.89 mg/dL (ref 0.50–1.10)
GFR, Est African American: 89 mL/min/{1.73_m2} (ref >=60)
GFR, Est Non African American: 77 mL/min/{1.73_m2} (ref >=60)
GLOBULIN: 3.6 g/dL (ref 1.9–3.7)
Glucose, Bld: 102 mg/dL — ABNORMAL HIGH (ref 65–99)
POTASSIUM: 4.2 mmol/L (ref 3.5–5.3)
SODIUM: 137 mmol/L (ref 135–146)
Total Bilirubin: 0.3 mg/dL (ref 0.2–1.2)
Total Protein: 7.6 g/dL (ref 6.1–8.1)

## 2018-03-05 LAB — CBC WITH DIFFERENTIAL/PLATELET
BASOS ABS: 34 {cells}/uL (ref 0–200)
Basophils Relative: 0.4 %
EOS PCT: 3.2 %
Eosinophils Absolute: 272 cells/uL (ref 15–500)
HEMATOCRIT: 37.8 % (ref 35.0–45.0)
Hemoglobin: 12.5 g/dL (ref 11.7–15.5)
LYMPHS ABS: 2482 {cells}/uL (ref 850–3900)
MCH: 28.1 pg (ref 27.0–33.0)
MCHC: 33.1 g/dL (ref 32.0–36.0)
MCV: 84.9 fL (ref 80.0–100.0)
MPV: 10.2 fL (ref 7.5–12.5)
Monocytes Relative: 9.3 %
NEUTROS PCT: 57.9 %
Neutro Abs: 4922 cells/uL (ref 1500–7800)
PLATELETS: 347 10*3/uL (ref 140–400)
RBC: 4.45 10*6/uL (ref 3.80–5.10)
RDW: 16 % — AB (ref 11.0–15.0)
TOTAL LYMPHOCYTE: 29.2 %
WBC mixed population: 791 cells/uL (ref 200–950)
WBC: 8.5 10*3/uL (ref 3.8–10.8)

## 2018-03-05 LAB — T-HELPER CELL (CD4) - (RCID CLINIC ONLY)
CD4 T CELL HELPER: 30 % — AB (ref 33–55)
CD4 T Cell Abs: 740 /uL (ref 400–2700)

## 2018-03-05 LAB — RPR: RPR: NONREACTIVE

## 2018-03-06 LAB — URINE CYTOLOGY ANCILLARY ONLY
CHLAMYDIA, DNA PROBE: NEGATIVE
NEISSERIA GONORRHEA: NEGATIVE

## 2018-03-06 LAB — HIV-1 RNA QUANT-NO REFLEX-BLD
HIV 1 RNA Quant: <20 copies/mL — AB
HIV-1 RNA Quant, Log: <1.3 Log copies/mL — AB

## 2018-03-18 ENCOUNTER — Encounter: Payer: Self-pay | Admitting: Behavioral Health

## 2018-03-18 ENCOUNTER — Encounter: Payer: Self-pay | Admitting: Infectious Disease

## 2018-03-18 ENCOUNTER — Ambulatory Visit (INDEPENDENT_AMBULATORY_CARE_PROVIDER_SITE_OTHER): Payer: Self-pay | Admitting: Infectious Disease

## 2018-03-18 VITALS — BP 156/93 | HR 89 | Temp 97.4°F | Ht 62.0 in | Wt 231.0 lb

## 2018-03-18 DIAGNOSIS — Z23 Encounter for immunization: Secondary | ICD-10-CM

## 2018-03-18 DIAGNOSIS — I10 Essential (primary) hypertension: Secondary | ICD-10-CM

## 2018-03-18 DIAGNOSIS — B20 Human immunodeficiency virus [HIV] disease: Secondary | ICD-10-CM

## 2018-03-18 MED ORDER — LISINOPRIL 10 MG PO TABS: 10.0000 mg | ORAL_TABLET | Freq: Every day | ORAL | 11 refills | Status: DC

## 2018-03-18 NOTE — Addendum Note (Signed)
Addended by: Valarie ConesSTALEY, Hobie Kohles on: 03/18/2018 04:33 PM   Modules accepted: Orders

## 2018-03-18 NOTE — Addendum Note (Signed)
Addended by: Valarie ConesSTALEY, Meilin Brosh on: 03/18/2018 04:32 PM   Modules accepted: Orders

## 2018-03-18 NOTE — Progress Notes (Signed)
Chief complaint : headache Subjective:    Patient ID: Monique [name], female    DOB: 1971/07/19, 47 y.o.   MRN: [id number]  HPI   47 year old African American lady with HIV AIDS ( August 2014) also with lung abscess . She was initially treated for both lung abscess and possibility of pulmonary tuberculosis. See prior notes for workup ultimately all data was negative for pulmonary tuberculosis and she did finish a course of therapy for latent tuberculosis.  She also had protracted Augmentin .  Her HIV has been well controlled most recently on BIKTARVY.  She has suffered from headaches the past 2 days including time where she was sent for isometric exercises by her employer. She comes with form seeking "clearnace" to perform these exercises. She says she is very stressed out from working 2 jobs.     Past Medical History:  Diagnosis Date  . Chest pressure 09/16/2017  . Exertional chest pain 05/13/2017  . Headache 02/06/2017  . HIV (human immunodeficiency virus infection) (HCC)   . Hypertension   . 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 Lower extremity edema 01/12/2015  . Lung abscess (HCC)   . Nausea 02/06/2017  . Overweight 01/12/2015  . Pneumonia   . TB (tuberculosis)   . Tonsillitis   . Viral URI 09/16/2017    Past Surgical History:  Procedure Laterality Date  . CESAREAN SECTION    . VIDEO BRONCHOSCOPY Bilateral 04/13/2013   Procedure: VIDEO BRONCHOSCOPY WITHOUT FLUORO;  Surgeon: Kalman ShanMurali Ramaswamy, MD;  Location: [facility];  Service: Cardiopulmonary;  Laterality: Bilateral;    Family History  Problem Relation Age of Onset  . Diabetes Sister   . Hypertension Sister       Social History   Socioeconomic History  . Marital status: Single    Spouse name: Not on file  . Number of children: Not on file  . Years of education: Not on file  . Highest education level: Not on file  Occupational History  . Not on file  Social Needs   . Financial resource strain: Not on file  . Food insecurity:    Worry: Not on file    Inability: Not on file  . Transportation needs:    Medical: Not on file    Non-medical: Not on file  Tobacco Use  . Smoking status: Former Smoker    Last attempt to quit: 12/22/2013    Years since quitting: 4.2  . Smokeless tobacco: Never Used  Substance and Sexual Activity  . Alcohol use: No    Alcohol/week: 0.0 oz  . Drug use: No  . Sexual activity: Yes    Partners: Male  Lifestyle  . Physical activity:    Days per week: Not on file    Minutes per session: Not on file  . Stress: Not on file  Relationships  . Social connections:    Talks on phone: Not on file    Gets together: Not on file    Attends religious service: Not on file    Active member of club or organization: Not on file    Attends meetings of clubs or organizations: Not on file    Relationship status: Not on file  Other Topics Concern  . Not on file  Social History Narrative  . Not on file    No Known Allergies   Current Outpatient Medications:  .  acetaminophen (TYLENOL) 500 MG tablet, Take 2 tablets (1,000 mg total) by mouth every  8 (eight) hours as needed for mild pain or fever., Disp: 30 tablet, Rfl: 0 .  albuterol (PROVENTIL HFA;VENTOLIN HFA) 108 (90 Base) MCG/ACT inhaler, Inhale 2 puffs into the lungs every 6 (six) hours as needed for wheezing or shortness of breath., Disp: 1 Inhaler, Rfl: 2 .  amLODipine (NORVASC) 10 MG tablet, TAKE 1 TABLET(10 MG) BY MOUTH DAILY, Disp: 30 tablet, Rfl: 3 .  BIKTARVY 50-200-25 MG TABS tablet, TAKE 1 TABLET BY MOUTH DAILY, Disp: 30 tablet, Rfl: 0 .  cetirizine (ZYRTEC ALLERGY) 10 MG tablet, Take 1 tablet (10 mg total) by mouth daily., Disp: 30 tablet, Rfl: 0 .  hydrochlorothiazide (HYDRODIURIL) 25 MG tablet, Take 1 tablet (25 mg total) by mouth daily., Disp: 30 tablet, Rfl: 11 .  HYDROcodone-homatropine (HYCODAN) 5-1.5 MG/5ML syrup, Take 5 mLs by mouth at bedtime as needed. (Patient  not taking: Reported on 03/18/2018), Disp: 100 mL, Rfl: 0  Current Facility-Administered Medications:  .  aspirin tablet 81 mg, 81 mg, Oral, Once, Daiva Eves, Lisette Grinder, MD   Review of Systems  Constitutional: Negative for activity change, appetite change, chills, diaphoresis, fatigue and unexpected weight change.  HENT: Negative for facial swelling, rhinorrhea, sinus pressure, sneezing and trouble swallowing.   Eyes: Negative for photophobia and visual disturbance.  Respiratory: Positive for cough. Negative for chest tightness, shortness of breath and stridor.   Cardiovascular: Negative for chest pain, palpitations and leg swelling.  Gastrointestinal: Negative for abdominal distention, anal bleeding, blood in stool, constipation and nausea.  Genitourinary: Negative for difficulty urinating, dysuria, flank pain and hematuria.  Musculoskeletal: Negative for arthralgias, back pain, gait problem, joint swelling and myalgias.  Skin: Negative for color change, pallor and wound.  Neurological: Positive for headaches. Negative for dizziness, tremors, weakness and light-headedness.  Hematological: Negative for adenopathy. Does not bruise/bleed easily.  Psychiatric/Behavioral: Negative for agitation, behavioral problems, confusion, decreased concentration, dysphoric mood and sleep disturbance.       Objective:   Physical Exam  Constitutional: She is oriented to person, place, and time. She appears well-developed and well-nourished. No distress.  HENT:  Head: Normocephalic and atraumatic.  Mouth/Throat: Oropharynx is clear and moist.  Eyes: Pupils are equal, round, and reactive to light. Conjunctivae and EOM are normal. No scleral icterus.  Neck: Normal range of motion. Neck supple.  Cardiovascular: Normal rate, regular rhythm and normal heart sounds. Exam reveals no gallop and no friction rub.  No murmur heard. Pulmonary/Chest: Effort normal and breath sounds normal. No respiratory distress. She  has no wheezes. She has no rales. She exhibits no tenderness.  Abdominal: Soft. Bowel sounds are normal. She exhibits no distension.  Musculoskeletal: She exhibits no edema or tenderness.  Neurological: She is alert and oriented to person, place, and time. She exhibits normal muscle tone. Coordination normal.  Skin: Skin is warm. She is not diaphoretic.  Psychiatric: Her behavior is normal. Judgment and thought content normal. Her mood appears anxious. Her speech is not slurred. Cognition and memory are normal.       Assessment & Plan:    HIV/AIDS: well controlled Continue BIKTARVY  HMAP has been renewed  Hypertension: BI am adding ACEI and rechecking BP this Friday along with BMP. Hopefully it is in better range and then we dont need to worry about her needing clearance for isometric testing with high BP and HA  Vitals:   03/18/18 1538  BP: (!) 156/93  Pulse: 89  Temp: (!) 97.4 F (36.3 C)

## 2018-03-21 ENCOUNTER — Other Ambulatory Visit: Payer: Self-pay

## 2018-03-24 ENCOUNTER — Other Ambulatory Visit: Payer: Self-pay

## 2018-03-25 ENCOUNTER — Other Ambulatory Visit: Payer: Self-pay | Admitting: Infectious Diseases

## 2018-03-25 ENCOUNTER — Encounter: Payer: Self-pay | Admitting: Behavioral Health

## 2018-03-25 ENCOUNTER — Ambulatory Visit: Payer: Self-pay | Admitting: Behavioral Health

## 2018-03-25 ENCOUNTER — Other Ambulatory Visit: Payer: Self-pay

## 2018-03-25 DIAGNOSIS — B20 Human immunodeficiency virus [HIV] disease: Secondary | ICD-10-CM

## 2018-03-25 MED ORDER — LISINOPRIL-HYDROCHLOROTHIAZIDE 20-25 MG PO TABS: 1.0000 | ORAL_TABLET | Freq: Every day | ORAL | 0 refills | Status: DC

## 2018-03-25 NOTE — Patient Instructions (Signed)
Stop taking your HCTZ and Lisinopril --> Sending in a new pill that combines these for you and increases your Lisinopril dose.   Continue taking your amlodipine.   If you smoke cigarettes please consider reducing or stopping as this can make your blood pressure very high as well.   If you can check your blood pressure at home or at pharmacy to spot check this would be helpful. Would like to see you values less than 150/90 - you are not too far away from this goal so hopefully this last dose increase will to it.   Back Monday for a BP check.

## 2018-03-26 LAB — CBC WITH DIFFERENTIAL/PLATELET
BASOS ABS: 42 {cells}/uL (ref 0–200)
Basophils Relative: 0.5 %
EOS ABS: 210 {cells}/uL (ref 15–500)
Eosinophils Relative: 2.5 %
HEMATOCRIT: 34.6 % — AB (ref 35.0–45.0)
Hemoglobin: 11.8 g/dL (ref 11.7–15.5)
LYMPHS ABS: 2570 {cells}/uL (ref 850–3900)
MCH: 28.3 pg (ref 27.0–33.0)
MCHC: 34.1 g/dL (ref 32.0–36.0)
MCV: 83 fL (ref 80.0–100.0)
MPV: 11 fL (ref 7.5–12.5)
Monocytes Relative: 10.4 %
NEUTROS PCT: 56 %
Neutro Abs: 4704 cells/uL (ref 1500–7800)
Platelets: 326 10*3/uL (ref 140–400)
RBC: 4.17 10*6/uL (ref 3.80–5.10)
RDW: 15.7 % — ABNORMAL HIGH (ref 11.0–15.0)
Total Lymphocyte: 30.6 %
WBC: 8.4 10*3/uL (ref 3.8–10.8)
WBCMIX: 874 {cells}/uL (ref 200–950)

## 2018-03-26 LAB — RPR: RPR: NONREACTIVE

## 2018-03-26 LAB — COMPLETE METABOLIC PANEL WITH GFR
AG Ratio: 1.3 (calc) (ref 1.0–2.5)
ALKALINE PHOSPHATASE (APISO): 61 U/L (ref 33–115)
ALT: 24 U/L (ref 6–29)
AST: 17 U/L (ref 10–35)
Albumin: 4.2 g/dL (ref 3.6–5.1)
BILIRUBIN TOTAL: 0.3 mg/dL (ref 0.2–1.2)
BUN: 13 mg/dL (ref 7–25)
CHLORIDE: 107 mmol/L (ref 98–110)
CO2: 22 mmol/L (ref 20–32)
CREATININE: 0.82 mg/dL (ref 0.50–1.10)
Calcium: 9.3 mg/dL (ref 8.6–10.2)
GFR, Est African American: 99 mL/min/{1.73_m2} (ref >=60)
GFR, Est Non African American: 85 mL/min/{1.73_m2} (ref >=60)
Globulin: 3.2 g/dL (calc) (ref 1.9–3.7)
Glucose, Bld: 103 mg/dL — ABNORMAL HIGH (ref 65–99)
Potassium: 4.1 mmol/L (ref 3.5–5.3)
Sodium: 138 mmol/L (ref 135–146)
Total Protein: 7.4 g/dL (ref 6.1–8.1)

## 2018-03-26 NOTE — Progress Notes (Signed)
Patient came in today for a b/p recheck.  B/P was taken on Dinamap machine and it was 134/94, it then was taken manually about 15 minutes later NP made aware.  New med orders placed and patient to come back next week for a repeat blood pressure check per Judeth CornfieldStephanie. Angeline SlimAshley Hill RN

## 2018-03-27 LAB — T-HELPER CELL (CD4) - (RCID CLINIC ONLY)
CD4 % Helper T Cell: 34 % (ref 33–55)
CD4 T Cell Abs: 870 /uL (ref 400–2700)

## 2018-03-28 ENCOUNTER — Telehealth: Payer: Self-pay | Admitting: Behavioral Health

## 2018-03-28 LAB — HIV-1 RNA QUANT-NO REFLEX-BLD
HIV 1 RNA QUANT: DETECTED {copies}/mL — AB
HIV-1 RNA QUANT, LOG: DETECTED {Log_copies}/mL — AB

## 2018-03-28 LAB — URINE CYTOLOGY ANCILLARY ONLY
Chlamydia: NEGATIVE
NEISSERIA GONORRHEA: NEGATIVE

## 2018-03-28 NOTE — Telephone Encounter (Signed)
Patient's FMLA paperwork received via Fax 03/27/2018.  [name] made aware that paperwork was received.  Paperwork placed in SYSCOStephanie Dixon's box.  A copy was made and placed in  Triage folder [facility] Dough[facility] RN

## 2018-03-30 ENCOUNTER — Other Ambulatory Visit: Payer: Self-pay | Admitting: Infectious Disease

## 2018-03-30 DIAGNOSIS — I10 Essential (primary) hypertension: Secondary | ICD-10-CM

## 2018-03-31 ENCOUNTER — Ambulatory Visit: Payer: Self-pay | Admitting: *Deleted

## 2018-03-31 DIAGNOSIS — I10 Essential (primary) hypertension: Secondary | ICD-10-CM

## 2018-03-31 NOTE — Progress Notes (Signed)
144/92, pulse 97. Patient here for nurse visit, blood pressure check.  She states she has been taking her blood pressure medication since the new combo was prescribed, states she took it this morning.  She would like to know the status of her referral to Internal medicine for primary care. RN entered it in [employer]. Andree CossHowell, Haidyn Kilburg M, RN

## 2018-04-01 LAB — BASIC METABOLIC PANEL WITH GFR
BUN: 14 mg/dL (ref 7–25)
CHLORIDE: 107 mmol/L (ref 98–110)
CO2: 23 mmol/L (ref 20–32)
Calcium: 9.2 mg/dL (ref 8.6–10.2)
Creat: 0.83 mg/dL (ref 0.50–1.10)
GFR, EST AFRICAN AMERICAN: 97 mL/min/{1.73_m2} (ref >=60)
GFR, EST NON AFRICAN AMERICAN: 84 mL/min/{1.73_m2} (ref >=60)
Glucose, Bld: 99 mg/dL (ref 65–99)
POTASSIUM: 4 mmol/L (ref 3.5–5.3)
Sodium: 137 mmol/L (ref 135–146)

## 2018-04-02 ENCOUNTER — Telehealth: Payer: Self-pay

## 2018-04-02 NOTE — Telephone Encounter (Signed)
PT called today for an update on FMLA paperwork that was dropped off. Spoke with RN who last spoke with pt regarding these forms stated [name], NP had them in her box and was just waiting to have them filled out. Will call pt back once forms are signed. Monique CourierJose L Lemonte [name], [place]

## 2018-04-04 ENCOUNTER — Telehealth: Payer: Self-pay | Admitting: *Deleted

## 2018-04-04 NOTE — Telephone Encounter (Signed)
Patient in on 03/31/18 to have a blood pressure check at that time she asked about her FMLA paperwork. Advised her Judeth CornfieldStephanie could give her a letter for the recent days she has missed but we would have to have Dr Daiva EvesVan Dam do her long term FMLA paperwork as he is her provider and most familiar with her case. She advised she understood. Also told her he is out of the office until September. However she has called everyday since to see if they are done. When I try to return her call her phone does not have a voice mail set up and she does not answer the line. Will try again later.  Paperwork is in Dr Daiva EvesVan Dam box.

## 2018-04-14 ENCOUNTER — Telehealth: Payer: Self-pay

## 2018-04-14 ENCOUNTER — Other Ambulatory Visit: Payer: Self-pay

## 2018-04-14 NOTE — Telephone Encounter (Signed)
Patient is calling to see if FMLA form has been completed.

## 2018-04-18 NOTE — Telephone Encounter (Signed)
FMLA forms completed by Dr Drue SecondSnider for Dr Daiva EvesVan Dam . [facility] to [facility] , Attn: Pollie MeyerMichelle Lindstrom [phone] on 04-18-18. Copy at front desk for patient.  Copy sent for scanning .   Laurell Josephsammy K Ruqaya Strauss, RN   Called patient . No answer or voicemail.

## 2018-05-05 ENCOUNTER — Telehealth: Payer: Self-pay | Admitting: *Deleted

## 2018-05-05 DIAGNOSIS — B20 Human immunodeficiency virus [HIV] disease: Secondary | ICD-10-CM

## 2018-05-05 MED ORDER — LISINOPRIL-HYDROCHLOROTHIAZIDE 20-25 MG PO TABS
1.0000 | ORAL_TABLET | Freq: Every day | ORAL | 2 refills | Status: DC
Start: 2018-05-05 — End: 2018-05-19

## 2018-05-05 NOTE — Telephone Encounter (Signed)
Sent in refills, left message at [facility] to cancel previous prescriptions of lisinopril/hctz.  Patient will pick up today, knows this combination pill replaces her stand-alone lisinopril and hydrochlorothiazide.

## 2018-05-05 NOTE — Addendum Note (Signed)
Addended by: Andree Coss on: 05/05/2018 12:39 PM   Modules accepted: Orders

## 2018-05-05 NOTE — Telephone Encounter (Signed)
She possibly has a headache because she is taking a lower dose of lisinopril and her blood pressure is high - Please refill her lisinopril 20 mg + HCTZ 25 mg combination pill for 3 months. Once she picks this up please have her discontinue taking the separate pills. Hopefully this will bridge her until a primary care team appointment.  Thank you, Marcelino Duster.

## 2018-05-05 NOTE — Telephone Encounter (Signed)
Patient called to see why her blood pressure medications were switched back to the old regimen (lisinopril 10 mg and hydrochlorothiazide 25 mg is 2 different pills again instead of combo of lisinopril 20 mg/hctz 25 mg).  She has had a headache for the last week. It appears she only had a 30 day supply of her new dosing. Please advise on refills. Andree Coss, RN

## 2018-05-06 ENCOUNTER — Encounter: Payer: Self-pay | Admitting: Family

## 2018-05-06 ENCOUNTER — Ambulatory Visit (INDEPENDENT_AMBULATORY_CARE_PROVIDER_SITE_OTHER): Payer: Self-pay | Admitting: Family

## 2018-05-06 VITALS — BP 143/100 | HR 93 | Temp 99.1°F | Wt 229.0 lb

## 2018-05-06 DIAGNOSIS — I10 Essential (primary) hypertension: Secondary | ICD-10-CM

## 2018-05-06 DIAGNOSIS — R0981 Nasal congestion: Secondary | ICD-10-CM

## 2018-05-06 MED ORDER — PREDNISONE 5 MG PO TABS: 5.0000 mg | ORAL_TABLET | Freq: Every day | ORAL | 0 refills | Status: DC

## 2018-05-06 NOTE — Assessment & Plan Note (Signed)
Monique [name] has new onset sinus pain/pressure and congestion in the past 24 hours. No evidence of current bacterial infection at present and will treat symptomatically with low dose prednisone and OTC medications as needed for symptom relief and supportive care. Advised to return if symptoms worsen, do not improve, or improve and then worsen. OTC medication guide provided in AVS.

## 2018-05-06 NOTE — Patient Instructions (Signed)
Nice to meet you.  Please continue to take your medications as prescribed.  Monitor your blood pressure at home at different times throughout the day.  A prescription for prednisone was sent to your pharmacy.   Other over the counter medications are listed below.   Follow up in 3 weeks or sooner if needed.   Please let us know if your symptoms worsen or do not improve.  General Recommendations:    Please drink plenty of fluids.  Get plenty of rest   Sleep in humidified air  Use saline nasal sprays  Netti pot   OTC Medications:  Decongestants - helps relieve congestion   Flonase (generic fluticasone) or Nasacort (generic triamcinolone) - please make sure to use the "cross-over" technique at a 45 degree angle towards the opposite eye as opposed to straight up the nasal passageway.   If you have HIGH BLOOD PRESSURE - Coricidin HBP; AVOID any product that is -D as this contains pseudoephedrine which may increase your blood pressure.  Allergies - helps relieve runny nose, itchy eyes and sneezing   Claritin (generic loratidine), Allegra (fexofenidine), or Zyrtec (generic cyrterizine) for runny nose. These medications should not cause drowsiness.  Note - Benadryl (generic diphenhydramine) may be used however may cause drowsiness  Cough -   Delsym or Robitussin (generic dextromethorphan)  Expectorants - helps loosen mucus to ease removal   Mucinex (generic guaifenesin) as directed on the package.  Headaches / General Aches   Tylenol (generic acetaminophen) - DO NOT EXCEED 3 grams (3,000 mg) in a 24 hour time period  Advil/Motrin (generic ibuprofen)   Sore Throat -   Salt water gargle   Chloraseptic (generic benzocaine) spray or lozenges / Sucrets (generic dyclonine)

## 2018-05-06 NOTE — Assessment & Plan Note (Signed)
Blood pressure remains labile with changes in medications making steady state of current medications less likely. Continue current dose of lisinopril-hctz and amlodipine. Monitor blood pressure at home at differing times throughout the day. Encouraged change in nutrition and lifestyle to help reduce her blood pressure by decreasing carbohydrate and sugary food intake. Does have family history of hypertension, however may consider additional work up to ensure renal artery stenosis or other causes of hypertension are not present. Follow up in 3 weeks or sooner if needed.

## 2018-05-06 NOTE — Progress Notes (Signed)
Subjective:    Patient ID: Monique [name], female    DOB: 1970-10-30, 47 y.o.   MRN: [id number]  Chief Complaint  Patient presents with  . Sinusitis     HPI:  Monique [name] is a 47 y.o. female who presents today for an acute office visit.   Monique [name] was last seen in the office on 7/23 and noted to have poorly controlled hypertension and was started on lisinopril in addition to her hydrochlorothiazide. During blood pressure checks her blood pressure remained elevated and she was changed to Zestoretic. Within the last 24 hours she began having sinus congestion and headache. May have had a low grade fever but subjective. There are no modifying factors as she was concerned about medications raising her blood pressure.    No Known Allergies    Outpatient Medications Prior to Visit  Medication Sig Dispense Refill  . acetaminophen (TYLENOL) 500 MG tablet Take 2 tablets (1,000 mg total) by mouth every 8 (eight) hours as needed for mild pain or fever. 30 tablet 0  . albuterol (PROVENTIL HFA;VENTOLIN HFA) 108 (90 Base) MCG/ACT inhaler Inhale 2 puffs into the lungs every 6 (six) hours as needed for wheezing or shortness of breath. 1 Inhaler 2  . amLODipine (NORVASC) 10 MG tablet TAKE 1 TABLET(10 MG) BY MOUTH DAILY 30 tablet 5  . BIKTARVY 50-200-25 MG TABS tablet TAKE 1 TABLET BY MOUTH DAILY 30 tablet 5  . cetirizine (ZYRTEC ALLERGY) 10 MG tablet Take 1 tablet (10 mg total) by mouth daily. 30 tablet 0  . HYDROcodone-homatropine (HYCODAN) 5-1.5 MG/5ML syrup Take 5 mLs by mouth at bedtime as needed. 100 mL 0  . lisinopril-hydrochlorothiazide (PRINZIDE,ZESTORETIC) 20-25 MG tablet Take 1 tablet by mouth daily. 30 tablet 2   Facility-Administered Medications Prior to Visit  Medication Dose Route Frequency Provider Last Rate Last Dose  . aspirin tablet 81 mg  81 mg Oral Once Daiva Eves, Lisette Grinder, MD         Past Medical History:  Diagnosis Date  . Chest pressure 09/16/2017  .  Exertional chest pain 05/13/2017  . Headache 02/06/2017  . HIV (human immunodeficiency virus infection) (HCC)   . Hypertension   . 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 Lower extremity edema 01/12/2015  . Lung abscess (HCC)   . Nausea 02/06/2017  . Overweight 01/12/2015  . Pneumonia   . TB (tuberculosis)   . Tonsillitis   . Viral URI 09/16/2017     Past Surgical History:  Procedure Laterality Date  . CESAREAN SECTION    . VIDEO BRONCHOSCOPY Bilateral 04/13/2013   Procedure: VIDEO BRONCHOSCOPY WITHOUT FLUORO;  Surgeon: Kalman Shan, MD;  Location: [facility] ENDOSCOPY;  Service: Cardiopulmonary;  Laterality: Bilateral;       Review of Systems  Constitutional: Negative for chills and fever.  HENT: Positive for congestion, sinus pressure and sinus pain. Negative for ear discharge, ear pain, facial swelling, sneezing and sore throat.   Eyes:       Negative for changes in vision  Respiratory: Negative for cough, chest tightness and wheezing.   Cardiovascular: Negative for chest pain, palpitations and leg swelling.  Neurological: Positive for headaches. Negative for dizziness, weakness and light-headedness.      Objective:    BP (!) 143/100   Pulse 93   Temp 99.1 F (37.3 C)   Wt 229 lb (103.9 kg)   LMP 04/27/2018   BMI 41.88 kg/m  Nursing note and vital  signs reviewed.  Physical Exam  Constitutional: She is oriented to person, place, and time. She appears well-developed and well-nourished.  HENT:  Right Ear: Hearing, tympanic membrane, external ear and ear canal normal.  Left Ear: Hearing, tympanic membrane, external ear and ear canal normal.  Nose: Right sinus exhibits maxillary sinus tenderness and frontal sinus tenderness. Left sinus exhibits maxillary sinus tenderness and frontal sinus tenderness.  Mouth/Throat: Uvula is midline, oropharynx is clear and moist and mucous membranes are normal.  Neck: Neck supple.  Cardiovascular:  Normal rate, regular rhythm, normal heart sounds and intact distal pulses. Exam reveals no gallop and no friction rub.  No murmur heard. Pulmonary/Chest: Effort normal and breath sounds normal. No stridor. No respiratory distress. She has no wheezes. She has no rales. She exhibits no tenderness.  Neurological: She is alert and oriented to person, place, and time.  Skin: Skin is warm and dry.  Psychiatric: She has a normal mood and affect.       Assessment & Plan:   Problem List Items Addressed This Visit      Cardiovascular and Mediastinum   Essential hypertension    Blood pressure remains labile with changes in medications making steady state of current medications less likely. Continue current dose of lisinopril-hctz and amlodipine. Monitor blood pressure at home at differing times throughout the day. Encouraged change in nutrition and lifestyle to help reduce her blood pressure by decreasing carbohydrate and sugary food intake. Does have family history of hypertension, however may consider additional work up to ensure renal artery stenosis or other causes of hypertension are not present. Follow up in 3 weeks or sooner if needed.         Other   Nasal congestion - Primary    Monique [name] has new onset sinus pain/pressure and congestion in the past 24 hours. No evidence of current bacterial infection at present and will treat symptomatically with low dose prednisone and OTC medications as needed for symptom relief and supportive care. Advised to return if symptoms worsen, do not improve, or improve and then worsen. OTC medication guide provided in AVS.           I am having Vikki Ports T. Orne start on predniSONE. I am also having her maintain her albuterol, HYDROcodone-homatropine, cetirizine, acetaminophen, amLODipine, BIKTARVY, and lisinopril-hydrochlorothiazide. We will continue to administer aspirin.   Meds ordered this encounter  Medications  . predniSONE (DELTASONE) 5 MG tablet     Sig: Take 1 tablet (5 mg total) by mouth daily with breakfast.    Dispense:  5 tablet    Refill:  0    Order Specific Question:   Supervising Provider    Answer:   Judyann Munson [[id number]]     Follow-up: Return in about 3 weeks (around 05/27/2018), or if symptoms worsen or fail to improve.   Marcos Eke, MSN, FNP-C Nurse Practitioner [facility] Office phone: [phone] Pager: [phone] [facility] Main number: [phone]

## 2018-05-07 ENCOUNTER — Telehealth: Payer: Self-pay | Admitting: *Deleted

## 2018-05-07 NOTE — Telephone Encounter (Signed)
Patient states she is calling back as instructed by [name] Sours, as she is not feeling better. Andree Coss, RN

## 2018-05-15 ENCOUNTER — Telehealth: Payer: Self-pay | Admitting: Family

## 2018-05-15 NOTE — Telephone Encounter (Signed)
-----   Message from Lurlean Leydenravis F [place], [place] sent at 05/15/2018  4:22 PM EDT ----- Patient states she is waiting for release to go back to work. States you were supposed to call her. Please advise. Feliz Beamravis ----- Message ----- From: Oswaldo Milianeledonio, Johaura Sent: 05/15/2018   4:00 PM EDT To: [facility] Triage Nurse Pool  Patient called requesting to speak to [facility]. Said that she was supposed to get a call back from him on regards to her BP

## 2018-05-15 NOTE — Telephone Encounter (Signed)
Attempted to call patient with no mailbox set up. Ok to write release to go back to work if she is feeling better.

## 2018-05-16 NOTE — Telephone Encounter (Signed)
Attempted to call back several times with no established voicemail.

## 2018-05-19 ENCOUNTER — Encounter: Payer: Self-pay | Admitting: Internal Medicine

## 2018-05-19 ENCOUNTER — Ambulatory Visit (HOSPITAL_COMMUNITY)
Admission: RE | Admit: 2018-05-19 | Discharge: 2018-05-19 | Disposition: A | Discharge status: Home / Self Care | Payer: Self-pay | Source: Ambulatory Visit | Attending: Internal Medicine | Admitting: Internal Medicine

## 2018-05-19 ENCOUNTER — Other Ambulatory Visit: Payer: Self-pay

## 2018-05-19 ENCOUNTER — Ambulatory Visit (INDEPENDENT_AMBULATORY_CARE_PROVIDER_SITE_OTHER): Payer: Self-pay | Admitting: Internal Medicine

## 2018-05-19 VITALS — BP 139/89 | HR 92 | Temp 98.4°F | Wt 230.0 lb

## 2018-05-19 DIAGNOSIS — R0981 Nasal congestion: Secondary | ICD-10-CM

## 2018-05-19 DIAGNOSIS — I10 Essential (primary) hypertension: Secondary | ICD-10-CM

## 2018-05-19 DIAGNOSIS — B20 Human immunodeficiency virus [HIV] disease: Secondary | ICD-10-CM | POA: Insufficient documentation

## 2018-05-19 DIAGNOSIS — Z79899 Other long term (current) drug therapy: Secondary | ICD-10-CM

## 2018-05-19 DIAGNOSIS — Z23 Encounter for immunization: Secondary | ICD-10-CM

## 2018-05-19 DIAGNOSIS — G44219 Episodic tension-type headache, not intractable: Secondary | ICD-10-CM

## 2018-05-19 DIAGNOSIS — Z0000 Encounter for general adult medical examination without abnormal findings: Secondary | ICD-10-CM | POA: Insufficient documentation

## 2018-05-19 DIAGNOSIS — R079 Chest pain, unspecified: Secondary | ICD-10-CM

## 2018-05-19 DIAGNOSIS — R51 Headache: Secondary | ICD-10-CM

## 2018-05-19 MED ORDER — LOSARTAN POTASSIUM 25 MG PO TABS: 25.0000 mg | ORAL_TABLET | Freq: Every day | ORAL | 2 refills | Status: DC

## 2018-05-19 MED ORDER — FLUTICASONE PROPIONATE 50 MCG/ACT NA SUSP: 2.0000 | Freq: Every day | NASAL | 0 refills | Status: DC

## 2018-05-19 NOTE — Assessment & Plan Note (Addendum)
Assessment: Her nasal congestion and headache is likely secondary to allergic rhinitis.  Plan: 1. Scheduled Zyrtec 10 mg QD 2. Prescribed Flonase 2 sprays into both nostrils QD

## 2018-05-19 NOTE — Assessment & Plan Note (Signed)
Assessment: Monique [name] has a 1 month history of intermittent headaches with concomitant congestion and sinus pressure. I believe that her symptoms are most likely secondary to allergic rhinitis given that her symptoms have been relieved with Tylenol and Zyrtec PRN and she has a normal exam today.  Plan: 1. Scheduled Zyrtec 10 mg QD 2. Prescribed Flonase 2 sprays into both nostrils QD

## 2018-05-19 NOTE — Patient Instructions (Addendum)
Please stop taking your Lisinopril/Hydrochlorathiazide. For your blood pressure, I want you to take Amlodipine 10 mg everyday and the new medication Losartan 25 mg tablet every day.  Also for your sinus pressure and headache please take Zyrtec 10 mg tablet everyday.

## 2018-05-19 NOTE — Assessment & Plan Note (Signed)
Assessment:  HIV [name] HIV is well-controlled on Biktarvy.  Lab Results  Component Value Date   HIV1RNAQUANT <20 DETECTED (A) 03/25/2018   HIV1RNAQUANT <20 DETECTED (A) 03/04/2018   HIV1RNAQUANT <20 NOT DETECTED 09/02/2017   Lab Results  Component Value Date   CD4TABS 870 03/25/2018   CD4TABS 740 03/04/2018   CD4TABS 770 09/02/2017    Plan: 1. Continue Biktarvy 2. Follow-up with ID

## 2018-05-19 NOTE — Progress Notes (Signed)
CC: Establish care  HPI:  Monique [name] is a 47 y.o. female with a history of HTN and HIV who presents to establish care.  Hypertension Ms. Severe was seen in the ID clinic on 7/23 and noted to have poorly controlled hypertension while on Amlodipine 10 mg QD and HCTZ. Lisinopril was started but her blood pressure remained elevated. She was then changed to Zestroretic (Hydrochlorothiazide/lisinopril 20-25 mg QD). Her new anti-hypertensive regiment includes Amlodipine 10 mg QD and Zestoretic 20-25 mg QD. Since starting the combination pill she has had nausea everyday. This morning she only took Amlodipine and her blood pressure remains well-controlled (139/89).  HIV Monique [name] viral load remains undetectable on Biktarvy. Last CD4 count 870 on 03/25/18.  Lab Results  Component Value Date   HIV1RNAQUANT <20 DETECTED (A) 03/25/2018   HIV1RNAQUANT <20 DETECTED (A) 03/04/2018   HIV1RNAQUANT <20 NOT DETECTED 09/02/2017   Lab Results  Component Value Date   CD4TABS 870 03/25/2018   CD4TABS 740 03/04/2018   CD4TABS 770 09/02/2017    Headaches Monique [name] had an abrupt onset of intermittent headaches for the past month. She describes them as a diffuse dull pain which will last for several hours. Her last headache was 3 days ago which self-resolved with Tylenol and napping. She has also had intermittent congestion and sinus pressure which is worse in the morning. She denies fevers and SOB. She was seen in the ID clinic on 05/06/18 for these symptoms and was prescribed a 5 day course of prednisone 5 mg. She completed the steroid medication without alleviation of her symptoms. She also takes Zyrtec but states that she only takes it 2-3 times per week when she feels these symptoms. She does state alleviation of symptoms with Zyrtec.  Chest Pain Monique [name] has a 1 year history of intermittent exertional chest pain. She describes it as a left-sided sharp chest pain which can last between  seconds and minutes. She states that the last time she had this chest pain was 1 week ago. She was walking outside with a friend and began to feel "not well." She sat down and started having the sharp chest pain which lasted for 5-10 seconds. She applied a cold towel to her chest and drank ice water which did resolve her symptoms. She denies nausea, vomiting, diaphoresis, SOB. She was previously seen in the ID clinic in January 2019 with these symptoms and had an unremarkable EKG. She was referred to a cardiologist but did not have the funds to go (she is self-pay).   Health Maintenance She would like a flu shot today.  Past Medical History:  Diagnosis Date  . Chest pressure 09/16/2017  . Exertional chest pain 05/13/2017  . Headache 02/06/2017  . HIV (human immunodeficiency virus infection) (HCC)   . Hypertension   . 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 Lower extremity edema 01/12/2015  . Lung abscess (HCC)   . Nausea 02/06/2017  . Overweight 01/12/2015  . Pneumonia   . TB (tuberculosis)   . Tonsillitis   . Viral URI 09/16/2017   Family History: She has a sister who died at 63 years ago of a "massive heart attack." Her mother has cardiovascular disease and has a Visual merchandiser in place.   Social History: She denies tobacco, alcohol, and drug use.   Review of Systems:   Review of Systems  Constitutional: Negative for chills, diaphoresis and fever.  HENT: Positive for congestion. Negative for sore  throat.   Respiratory: Negative for cough, sputum production and shortness of breath.   Cardiovascular: Positive for chest pain. Negative for palpitations and leg swelling.  Gastrointestinal: Negative for abdominal pain, diarrhea, nausea and vomiting.  Genitourinary: Negative for dysuria and hematuria.  Skin: Negative for rash.  Neurological: Positive for headaches. Negative for dizziness and weakness.  All other systems reviewed and are  negative.   Physical Exam:  Vitals:   05/19/18 0940  BP: 139/89  Pulse: 92  Temp: 98.4 F (36.9 C)  TempSrc: Oral  SpO2: 97%  Weight: 230 lb (104.3 kg)   Physical Exam  Constitutional: She appears well-developed and well-nourished.  HENT:  Head: Normocephalic and atraumatic.  Mouth/Throat: Oropharynx is clear and moist. No oropharyngeal exudate.  No tenderness to palpation of scalp, sinuses, and neck.  Eyes: EOM are normal.  Neck: Normal range of motion.  Cardiovascular: Normal rate, regular rhythm and normal heart sounds.  Pulmonary/Chest: Effort normal and breath sounds normal.  Abdominal: Soft. Bowel sounds are normal.  Musculoskeletal:  No LE edema  Neurological: She is alert.  Skin: Skin is warm and dry.  Psychiatric: She has a normal mood and affect. Her behavior is normal.  Nursing note and vitals reviewed.   Assessment & Plan:   See Encounters Tab for problem based charting.  Patient seen with Dr. Heide SparkNarendra

## 2018-05-19 NOTE — Assessment & Plan Note (Signed)
Assessment: Monique [name] has a 1 year history of intermittent exertional chest pain but does not currently have chest pain. Given her cardiovascular disease risk factors (HIV positive, family history, hypertension), I am concerned for stable angina. EKG today showed normal sinus rhythm. She was previously referred to cardiology for a potential stress test but does not have the financial means to go. I will re-refer her to a cardiologist today with the hope that she becomes eligible for the orange card ASAP. I also recommended going to the ED if/when she has another episode of chest pain.  Plan: 1. Referral to cardiology 2. Recommend being seen in the ED if she has another episode of chest pain

## 2018-05-19 NOTE — Assessment & Plan Note (Signed)
Received flu shot today. 

## 2018-05-19 NOTE — Assessment & Plan Note (Signed)
Assessment: Monique [name] hypertension seems to be well-controlled on Amlodipine 10 mg QD and Zestoretic 20-25 mg QD. I am concerned that she is having nausea with the addition of the  Zestoretic 20-25 mg QD (likley caused by the lisinopril component given that she was previously on HCTZ without issue). I will discontinue the Zestoretic today and have her take Losartan 25 QD with her Amlodipine 25 mg QD.   1. Discontinue Zestoretic 20-25 mg QD  2. Prescribed Losartan 25 QD 3. Continue Amlodipine 25 mg QD 4. Follow-up in 2-3 weeks for blood pressure check and BMP to check renal function and electrolytes while on new BP med.

## 2018-05-20 ENCOUNTER — Telehealth: Payer: Self-pay | Admitting: *Deleted

## 2018-05-20 NOTE — Telephone Encounter (Signed)
Pt calls and states she had her flu shot 9/23 and is having chills and achiness since, she denies swelling, redness or heat to area of injection. Has not checked her temp. Denies N&V. States she feels horrible and cannot work today. She was advised to take plenty of po fluids, rest and if any of the above mentioned symptoms start to call back, the call became disconnected, attempted to call her back and got a recording stating cannot lm.

## 2018-05-20 NOTE — Telephone Encounter (Signed)
Please send work note to attn: michelle or steven grice at fax# [phone]. Pt needs it for tonight, she ask it be faxed asap

## 2018-05-20 NOTE — Progress Notes (Signed)
Internal Medicine Clinic Attending  I saw and evaluated the patient.  I personally confirmed the key portions of the history and exam documented by Dr. Prince and I reviewed pertinent patient test results.  The assessment, diagnosis, and plan were formulated together and I agree with the documentation in the resident's note.  

## 2018-05-20 NOTE — Telephone Encounter (Signed)
Pt needs a note for work since she cannot work [employer], she would like it faxed and will call back with the fax#

## 2018-05-20 NOTE — Telephone Encounter (Signed)
Letter faxed to # provided by pt.

## 2018-05-22 ENCOUNTER — Telehealth: Payer: Self-pay | Admitting: *Deleted

## 2018-05-22 MED FILL — LOSARTAN POTASSIUM 25 MG TA: 25 | 30 days supply | Qty: 30 | Fill #0

## 2018-05-22 NOTE — Telephone Encounter (Signed)
Pt could not afford [facility] price of losartan, called to [facility], pt aware

## 2018-06-23 ENCOUNTER — Ambulatory Visit: Payer: Self-pay

## 2018-06-24 ENCOUNTER — Other Ambulatory Visit: Payer: Self-pay | Admitting: Infectious Disease

## 2018-06-24 DIAGNOSIS — I10 Essential (primary) hypertension: Secondary | ICD-10-CM

## 2018-07-01 ENCOUNTER — Encounter: Payer: Self-pay | Admitting: *Deleted

## 2018-07-04 ENCOUNTER — Other Ambulatory Visit: Payer: Self-pay

## 2018-07-04 ENCOUNTER — Ambulatory Visit (INDEPENDENT_AMBULATORY_CARE_PROVIDER_SITE_OTHER): Payer: Self-pay | Admitting: Internal Medicine

## 2018-07-04 ENCOUNTER — Telehealth: Payer: Self-pay | Admitting: *Deleted

## 2018-07-04 VITALS — BP 157/101 | HR 91 | Temp 99.4°F | Ht 63.0 in | Wt 233.8 lb

## 2018-07-04 DIAGNOSIS — Z21 Asymptomatic human immunodeficiency virus [HIV] infection status: Secondary | ICD-10-CM

## 2018-07-04 DIAGNOSIS — J3489 Other specified disorders of nose and nasal sinuses: Secondary | ICD-10-CM

## 2018-07-04 DIAGNOSIS — M791 Myalgia, unspecified site: Secondary | ICD-10-CM | POA: Insufficient documentation

## 2018-07-04 LAB — INFLUENZA PANEL BY PCR (TYPE A & B)
Influenza A By PCR: NEGATIVE
Influenza B By PCR: NEGATIVE

## 2018-07-04 MED ORDER — ACETAMINOPHEN 500 MG PO TABS: 1000.0000 mg | ORAL_TABLET | Freq: Three times a day (TID) | ORAL | 0 refills | Status: DC | PRN

## 2018-07-04 NOTE — Telephone Encounter (Signed)
Call from pt - stated she might have the flu. Having chills and body aches which started early this morning; no fever nor sorethroat. She has not tried any OTC meds. Informed if she does have the flu she needs to come today to be eval and treated as soon as possible- she agreed to 1415 PM appt in [facility].

## 2018-07-04 NOTE — Patient Instructions (Addendum)
It was a pleasure taking care of you today, Ms. Urizar! I hope that you feel better, soon!  You likely have a viral upper respiratory infection.  We have tested you for the flu. That test has not yet resulted. It should later today, and at that point I will call you with the results and treatment plan.   At home you can take tylenol 1,000mg  q8hrs for fevers and pain.  If you have any questions feel free to call our clinic at [phone]. If your symptoms do not improve in 5-7 days, please return to clinic.  Thanks, Dr. Avie Arenas

## 2018-07-04 NOTE — Progress Notes (Signed)
   CC: myalgias and rhinorrhea  HPI:   [name] is a 47 y.o. female with the medical conditions as listed below who presents to the internal medicine clinic for myalgias, chills, and rhinorrhea. Please see problem based charting for the status of the patient's current and chronic medical conditions.   Past Medical History:  Diagnosis Date  . Chest pressure 09/16/2017  . Exertional chest pain 05/13/2017  . Headache 02/06/2017  . HIV (human immunodeficiency virus infection) (HCC)   . Hypertension   . 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 Lower extremity edema 01/12/2015  . Lung abscess (HCC)   . Nausea 02/06/2017  . Overweight 01/12/2015  . Pneumonia   . TB (tuberculosis)   . Tonsillitis   . Viral URI 09/16/2017    Review of Systems:   Pertinent positives mentioned in HPI. Remainder of all ROS negative.   Physical Exam: Vitals:   07/04/18 1534  BP: (!) 157/101  Pulse: 91  Temp: 99.4 F (37.4 C)  TempSrc: Oral  SpO2: 98%  Weight: 233 lb 12.8 oz (106.1 kg)  Height: 5\' 3"  (1.6 m)   Physical Exam  Constitutional: Well-developed, well-nourished, and in no distress.  Eyes: Pupils are equal, round, and reactive to light. EOM are normal.  HENT: Normal oropharynx. No cervical adenopathy. Cardiovascular: Normal rate and regular rhythm. No murmurs, rubs, or gallops. Pulmonary/Chest: Effort normal. Clear to auscultation bilaterally. No wheezes, rales, or rhonchi. Abdominal: Soft, non-distended, non-tender. Skin: Warm and dry. No rashes or wounds.  Assessment & Plan:   See Encounters Tab for problem based charting.  Patient seen with Dr. Cleda Daub

## 2018-07-04 NOTE — Telephone Encounter (Signed)
Patient called, asked if she could be seen today. She reports flu-like symptoms, feels it it just starting.  RN stated that we did not have a provider in the office today to see Azarah or to be able to send in a prescription. Patient stated she could call her PCP. Andree Coss, RN

## 2018-07-04 NOTE — Assessment & Plan Note (Addendum)
Monique [name] reports whole-body myalgias, particularly across her whole back, chills, rhinorrhea, and subjective fever that began this morning. She denies cough or sore throat. She received the flu shot in September. 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 She reports that she gets the flu ever year and that this feels like the flu coming on.   Assessment: The patient's symptoms are most likely due to a viral URI. Patient is HIV positive, but she is not immunosuppressed as viral load was undetectable and CD4 count was 870 in 02/2018. Flu swab collected today.   Plan 1. Supportive treatment with tylenol for fevers and myalgias.  2. F/u flu swab. If positive, will prescribe tamiflu.  ADDENDUM: Flu negative. Patient called with results. She reported she was feeling better after taking tylenol. Continue supportive treatment.

## 2018-07-07 NOTE — Telephone Encounter (Signed)
Pt was seen 11/8 in [facility]

## 2018-07-07 NOTE — Progress Notes (Signed)
Internal Medicine Clinic Attending  I saw and evaluated the patient.  I personally confirmed the key portions of the history and exam documented by Dr. Dorrell and I reviewed pertinent patient test results.  The assessment, diagnosis, and plan were formulated together and I agree with the documentation in the resident's note. 

## 2018-07-08 ENCOUNTER — Other Ambulatory Visit: Payer: Self-pay

## 2018-07-08 ENCOUNTER — Telehealth: Payer: Self-pay | Admitting: Internal Medicine

## 2018-07-08 ENCOUNTER — Other Ambulatory Visit: Payer: Self-pay | Admitting: *Deleted

## 2018-07-08 DIAGNOSIS — I10 Essential (primary) hypertension: Secondary | ICD-10-CM

## 2018-07-08 MED ORDER — AMLODIPINE BESYLATE 10 MG PO TABS: 10.0000 mg | ORAL_TABLET | Freq: Every day | ORAL | 2 refills | Status: DC

## 2018-07-08 MED ORDER — HYDROCHLOROTHIAZIDE 25 MG PO TABS: 25.0000 mg | ORAL_TABLET | Freq: Every day | ORAL | 2 refills | Status: DC

## 2018-07-08 MED FILL — AMLODIPINE BESYLATE 10 MG T: 10 | 30 days supply | Qty: 30 | Fill #0

## 2018-07-08 MED FILL — HYDROCHLOROTHIAZIDE 25 MG T: 25 | 30 days supply | Qty: 30 | Fill #0

## 2018-07-08 NOTE — Telephone Encounter (Signed)
Spoke w/ pt, request sent in new encounter

## 2018-07-08 NOTE — Telephone Encounter (Signed)
I saw Monique [name] during a routine clinic visit on 05/19/18.  She was found to have well-controlled blood pressure on Amlodipine 10 mg QD and Zestoretic 20-25 mg QD (recently switched from hydrochlorothiazide).  She however developed nausea after the change in her antihypertensive (likley caused by the lisinopril component given that she was previously on HCTZ without issue).  At that time I wanted her to discontinue her Zestoretic use and start losartan 25 QD with her Amlodipine 25 mg QD.    I received a refill request today for losartan, amlodipine, and hydrochlorothiazide.  I called Monique [name] to clarify and she states that she became very confused about what blood pressure medications she was supposed to take.  She had only been taking amlodipine 10 mg daily up until 1 week ago when she ran out.  She was seen on 07/04/18 (4 days ago) for upper respiratory symptoms and had an elevated blood pressure of 157/101.    She is currently out of all of her antihypertensive medications.  I will send in a refill for amlodipine 10 mg daily and hydrochlorothiazide 25 mg daily.  She was previously on this antihypertensive regimen without any side effects.  I will have her follow-up in 2 to 3 weeks for blood pressure check and repeat BMP to check electrolytes and renal function.  I encouraged her to bring all of her medications that she is taking with her to clinic. Monique [name] was in agreement with this plan.

## 2018-07-08 NOTE — Telephone Encounter (Signed)
I saw [name] during a routine clinic visit on 05/19/18.  She was found to have well-controlled blood pressure on Amlodipine 10 mg QD and Zestoretic 20-25 mg QD (recently switched from hydrochlorothiazide).  She however developed nausea after the change in her antihypertensive (likley caused by the lisinopril component given that she was previously on HCTZ without issue).  At that time I wanted her to discontinue her Zestoretic use and start losartan 25 QD with her Amlodipine 25 mg QD.    I received a refill request today for losartan, amlodipine, and hydrochlorothiazide.  I called Monique [name] to clarify and she states that she became very confused about what blood pressure medications she was supposed to take.  She had only been taking amlodipine 10 mg daily up until 1 week ago when she ran out.  She was seen on 07/04/18 (4 days ago) for upper respiratory symptoms and had an elevated blood pressure of 151/100.    She is currently out of all of her antihypertensive medications.  I will send in a refill for amlodipine 10 mg daily and hydrochlorothiazide 25 mg daily.  She was previously on this antihypertensive regimen without any side effects.  I will have her follow-up in 2 to 3 weeks for blood pressure check and repeat BMP to check electrolytes and renal function.  Monique [name] was in agreement with this plan.

## 2018-07-08 NOTE — Telephone Encounter (Signed)
Questions about bp med. Please call pt back.  

## 2018-07-21 ENCOUNTER — Encounter: Payer: Self-pay | Admitting: Internal Medicine

## 2018-07-21 ENCOUNTER — Ambulatory Visit: Payer: Self-pay

## 2018-07-25 ENCOUNTER — Ambulatory Visit (HOSPITAL_COMMUNITY)
Admission: EM | Admit: 2018-07-25 | Discharge: 2018-07-25 | Disposition: A | Discharge status: Home / Self Care | Payer: Self-pay | Attending: Family Medicine | Admitting: Family Medicine

## 2018-07-25 ENCOUNTER — Encounter (HOSPITAL_COMMUNITY): Payer: Self-pay

## 2018-07-25 ENCOUNTER — Ambulatory Visit (HOSPITAL_COMMUNITY): Payer: Self-pay

## 2018-07-25 DIAGNOSIS — R062 Wheezing: Secondary | ICD-10-CM

## 2018-07-25 DIAGNOSIS — J069 Acute upper respiratory infection, unspecified: Secondary | ICD-10-CM

## 2018-07-25 DIAGNOSIS — B9789 Other viral agents as the cause of diseases classified elsewhere: Secondary | ICD-10-CM

## 2018-07-25 MED ORDER — HYDROCODONE-HOMATROPINE 5-1.5 MG/5ML PO SYRP: 5.0000 mL | ORAL_SOLUTION | Freq: Four times a day (QID) | ORAL | 0 refills | Status: DC | PRN

## 2018-07-25 MED ORDER — PREDNISONE 10 MG (21) PO TBPK: ORAL_TABLET | Freq: Every day | ORAL | 0 refills | Status: DC

## 2018-07-25 NOTE — ED Triage Notes (Signed)
Pt present coughing, congestion and SOB for two days. Pt has tried OTC medication with no relief

## 2018-07-25 NOTE — ED Provider Notes (Signed)
[facility]   [id number] 07/25/18 Arrival Time: 1548  ASSESSMENT & PLAN:  1. Viral URI with cough   2. Wheezing     Meds ordered this encounter  Medications  . HYDROcodone-homatropine (HYCODAN) 5-1.5 MG/5ML syrup    Sig: Take 5 mLs by mouth every 6 (six) hours as needed for cough.    Dispense:  60 mL    Refill:  0  . predniSONE (STERAPRED UNI-PAK 21 TAB) 10 MG (21) TBPK tablet    Sig: Take by mouth daily. Take as directed.    Dispense:  21 tablet    Refill:  0    Cough medication sedation precautions. Discussed typical duration of symptoms. OTC symptom care as needed. Ensure adequate fluid intake and rest. May f/u with PCP or here as needed.  Reviewed expectations re: course of current medical issues. Questions answered. Outlined signs and symptoms indicating need for more acute intervention. Patient verbalized understanding. After Visit Summary given.   SUBJECTIVE: History from: patient.  Monique [name] is a 47 y.o. female who presents with complaint of nasal congestion, post-nasal drainage, and a persistent dry cough. Onset abrupt, at age 67-3 days ago. Overall with fatigue and with mild body aches. SOB: none. Wheezing: mild to moderate at times, esp with coughing. Fever: no. Overall normal PO intake without n/v. Sick contacts: no. No specific or significant aggravating or alleviating factors reported. OTC treatment: cold medications without much help.  Received flu shot this year: no.  Social History   Tobacco Use  Smoking Status Former Smoker  . Last attempt to quit: 12/22/2013  . Years since quitting: 4.5  Smokeless Tobacco Never Used    ROS: As per HPI.   OBJECTIVE:  Vitals:   07/25/18 1653  BP: (!) 144/91  Pulse: 98  Resp: 20  Temp: 98 F (36.7 C)  TempSrc: Oral  SpO2: 98%     General appearance: alert; appears fatigued HEENT: nasal congestion; clear runny nose; throat irritation secondary to post-nasal drainage Neck: supple  without LAD CV: RRR without murmer Lungs: unlabored respirations, symmetrical air entry mild expiratory wheezing; cough: moderate; able to speak full sentences Skin: warm and dry Psychological: alert and cooperative; normal mood and affect   No Known Allergies  Past Medical History:  Diagnosis Date  . Chest pressure 09/16/2017  . Exertional chest pain 05/13/2017  . Headache 02/06/2017  . HIV (human immunodeficiency virus infection) (HCC)   . Hypertension   . 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 Lower extremity edema 01/12/2015  . Lung abscess (HCC)   . Nausea 02/06/2017  . Overweight 01/12/2015  . Pneumonia   . TB (tuberculosis)   . Tonsillitis   . Viral URI 09/16/2017   Family History  Problem Relation Age of Onset  . Diabetes Sister   . Hypertension Sister    Social History   Socioeconomic History  . Marital status: Single    Spouse name: Not on file  . Number of children: Not on file  . Years of education: Not on file  . Highest education level: Not on file  Occupational History  . Not on file  Social Needs  . Financial resource strain: Not on file  . Food insecurity:    Worry: Not on file    Inability: Not on file  . Transportation needs:    Medical: Not on file    Non-medical: Not on file  Tobacco Use  . Smoking status: Former Smoker  Last attempt to quit: 12/22/2013    Years since quitting: 4.5  . Smokeless tobacco: Never Used  Substance and Sexual Activity  . Alcohol use: No    Alcohol/week: 0.0 standard drinks  . Drug use: No  . Sexual activity: Yes    Partners: Male  Lifestyle  . Physical activity:    Days per week: Not on file    Minutes per session: Not on file  . Stress: Not on file  Relationships  . Social connections:    Talks on phone: Not on file    Gets together: Not on file    Attends religious service: Not on file    Active member of club or organization: Not on file    Attends meetings of  clubs or organizations: Not on file    Relationship status: Not on file  . Intimate partner violence:    Fear of current or ex partner: Not on file    Emotionally abused: Not on file    Physically abused: Not on file    Forced sexual activity: Not on file  Other Topics Concern  . Not on file  Social History Narrative  . Not on file           Mardella Layman, MD 07/25/18 [phone]

## 2018-07-25 NOTE — Discharge Instructions (Signed)
Be aware, your cough medication may cause drowsiness. Please do not drive, operate heavy machinery or make important decisions while on this medication, it can cloud your judgement.  Follow up with your primary care doctor or here if you are not seeing improvement of your symptoms over the next several days, sooner if you feel you are worsening.  Caring for yourself: Get plenty of rest. Drink plenty of fluids, enough so that your urine is light yellow or clear like water. If you have kidney, heart, or liver disease and have to limit fluids, talk with your doctor before you increase the amount of fluids you drink. Take an over-the-counter pain medicine if needed, such as acetaminophen (Tylenol), ibuprofen (Advil, Motrin), or naproxen (Aleve), to relieve fever, headache, and muscle aches. Read and follow all instructions on the label. No one younger than 20 should take aspirin. It has been linked to Reye syndrome, a serious illness. Before you use over the counter cough and cold medicines, check the label. These medicines may not be safe for children younger than age 686 or for people with certain health problems. If the skin around your nose and lips becomes sore, put some petroleum jelly on the area.  Avoid spreading a viral respiratory illness: Wash your hands regularly, and keep your hands away from your face.  Stay home from school, work, and other public places until you are feeling better and your fever has been gone for at least 24 hours. The fever needs to have gone away on its own without the help of medicine.

## 2018-08-11 ENCOUNTER — Ambulatory Visit: Payer: Self-pay | Admitting: Cardiovascular Disease

## 2018-08-13 ENCOUNTER — Encounter: Payer: Self-pay | Admitting: Cardiovascular Disease

## 2018-08-21 ENCOUNTER — Other Ambulatory Visit: Payer: Self-pay | Admitting: Infectious Diseases

## 2018-08-21 DIAGNOSIS — B20 Human immunodeficiency virus [HIV] disease: Secondary | ICD-10-CM

## 2018-08-29 ENCOUNTER — Encounter: Payer: Self-pay | Admitting: Internal Medicine

## 2018-08-29 ENCOUNTER — Ambulatory Visit (INDEPENDENT_AMBULATORY_CARE_PROVIDER_SITE_OTHER): Payer: Self-pay | Admitting: Internal Medicine

## 2018-08-29 ENCOUNTER — Other Ambulatory Visit: Payer: Self-pay

## 2018-08-29 DIAGNOSIS — J0100 Acute maxillary sinusitis, unspecified: Secondary | ICD-10-CM

## 2018-08-29 DIAGNOSIS — J329 Chronic sinusitis, unspecified: Secondary | ICD-10-CM | POA: Insufficient documentation

## 2018-08-29 DIAGNOSIS — J320 Chronic maxillary sinusitis: Secondary | ICD-10-CM

## 2018-08-29 DIAGNOSIS — B20 Human immunodeficiency virus [HIV] disease: Secondary | ICD-10-CM

## 2018-08-29 DIAGNOSIS — J019 Acute sinusitis, unspecified: Secondary | ICD-10-CM | POA: Insufficient documentation

## 2018-08-29 DIAGNOSIS — R3915 Urgency of urination: Secondary | ICD-10-CM

## 2018-08-29 DIAGNOSIS — Z79899 Other long term (current) drug therapy: Secondary | ICD-10-CM

## 2018-08-29 MED ORDER — GUAIFENESIN ER 600 MG PO TB12: 600.0000 mg | ORAL_TABLET | Freq: Two times a day (BID) | ORAL | 1 refills | Status: DC | PRN

## 2018-08-29 MED ORDER — AMOXICILLIN-POT CLAVULANATE 500-125 MG PO TABS: 1.0000 | ORAL_TABLET | Freq: Three times a day (TID) | ORAL | 0 refills | Status: AC

## 2018-08-29 MED ORDER — IBUPROFEN 200 MG PO TABS: 200.0000 mg | ORAL_TABLET | Freq: Four times a day (QID) | ORAL | 0 refills | Status: DC | PRN

## 2018-08-29 MED FILL — AMOX-CLAV 500-125 MG TABLET: 500-125 | 7 days supply | Qty: 21 | Fill #0

## 2018-08-29 NOTE — Progress Notes (Signed)
   CC: Cough  HPI:  [name] is a 48 y.o. with PMHx listed below. She is here in clinic today due to worsening of cough and headache.  She reports Hx of recurrent cough for more than a month. He was seen in clinic and urgent care before and initially improved.(She was seen on urgent care on 11/29 and received  Hydrocodone-Homatropine syrup and Prednison.) How ever since yesterday, her cough got worse, and had some mucus with it. It was also associated with pressure like pain below her eyes as well as headache with intensity of 7/10 on right side of hear head. Headache mostly occures when coughing. She denies any sore throat or eacache. And has No PND. She checked her Tem last night and it was 99.6. No chills. No body ache. She mentions that her grandson has been sick, having cough and cold.    Past Medical History:  Diagnosis Date  . Chest pressure 09/16/2017  . Exertional chest pain 05/13/2017  . Headache 02/06/2017  . HIV (human immunodeficiency virus infection) (HCC)   . Hypertension   . 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 Lower extremity edema 01/12/2015  . Lung abscess (HCC)   . Nausea 02/06/2017  . Overweight 01/12/2015  . Pneumonia   . TB (tuberculosis)   . Tonsillitis   . Viral URI 09/16/2017   Family Hx: HTN   Mother CHF   Mother HTN   Father DM     Sister  Social Hx: Does not smoke No Alcohol No illicit drugs  Review of Systems:   Review of Systems  Constitutional: Negative for chills and fever.  HENT: Positive for congestion and sinus pain. Negative for ear pain and sore throat.   Respiratory: Positive for cough. Negative for hemoptysis and shortness of breath.   Cardiovascular: Negative for chest pain and leg swelling.  Gastrointestinal: Negative for abdominal pain, diarrhea, nausea and vomiting.  Genitourinary: Positive for urgency. Negative for dysuria.  Neurological: Positive for headaches. Negative for  dizziness.    Physical Exam:  There were no vitals filed for this visit. Physical Exam Constitutional:      Appearance: Normal appearance.  HENT:     Head: Normocephalic and atraumatic.     Nose: Congestion present.     Mouth/Throat:     Pharynx: Oropharynx is clear. No posterior oropharyngeal erythema.  Eyes:     Extraocular Movements: Extraocular movements intact.  Cardiovascular:     Rate and Rhythm: Normal rate and regular rhythm.     Pulses: Normal pulses.     Heart sounds: Normal heart sounds. No murmur.  Pulmonary:     Effort: Pulmonary effort is normal. No respiratory distress.     Breath sounds: Normal breath sounds. No wheezing or rales.  Abdominal:     Palpations: Abdomen is soft.     Tenderness: There is no abdominal tenderness.  Musculoskeletal:     Right lower leg: Edema present.     Left lower leg: Edema present.  Skin:    General: Skin is warm and dry.  Neurological:     Mental Status: She is alert and oriented to person, place, and time.  Psychiatric:        Mood and Affect: Mood normal.        Behavior: Behavior normal.     Assessment & Plan:   See Encounters Tab for problem based charting.  Patient discussed with Dr. Rogelia BogaButcher

## 2018-08-29 NOTE — Assessment & Plan Note (Signed)
Patient is on Biktarvy. Last viral load undetectable and CD4 870 on 02/2018. -Continue Biktary and continue F/o with ID

## 2018-08-29 NOTE — Patient Instructions (Addendum)
Thank you for allowing [place] to provide your care today. You came in to the clinic due to cough and headache and sinus pressure. Your symptoms seem to be due to sinusitis. I prescribe the antibiotic and Mucinex and Ibuprofen. Please take them as instructed.    Please return to clinic if no improvement within a week. I also recommend you to follow up with your PCP in next 3-4 months for blood pressure follow up.  Should you have any questions or concerns please call the internal medicine clinic at [phone].    Thank you

## 2018-09-02 NOTE — Assessment & Plan Note (Addendum)
Patient presented with nasal congestion, sinus pressure and headache since yesterday. Reports hx of recurrent cough for past couple of weeks.  Afebrile, Lungs are CTA bilaterally.  Has tenderness on maxillary sinuses. Acute or subacute sinusitis.  -Augmentin 500-125 q8h x 7 days -Mucinex -Ibuprophen

## 2018-09-03 ENCOUNTER — Other Ambulatory Visit: Payer: Self-pay | Admitting: *Deleted

## 2018-09-03 DIAGNOSIS — B20 Human immunodeficiency virus [HIV] disease: Secondary | ICD-10-CM

## 2018-09-03 NOTE — Progress Notes (Signed)
Internal Medicine Clinic Attending  Case discussed with Dr. Masoudi  at the time of the visit.  We reviewed the resident's history and exam and pertinent patient test results.  I agree with the assessment, diagnosis, and plan of care documented in the resident's note.  

## 2018-09-04 ENCOUNTER — Other Ambulatory Visit: Payer: Self-pay

## 2018-09-04 ENCOUNTER — Encounter: Payer: Self-pay | Admitting: Internal Medicine

## 2018-09-04 ENCOUNTER — Telehealth: Payer: Self-pay

## 2018-09-04 NOTE — Telephone Encounter (Signed)
rtc to pt, recording stated vmail not setup

## 2018-09-04 NOTE — Telephone Encounter (Signed)
Needs to speak with a nurse about med and returning to work. Please call pt back.

## 2018-09-04 NOTE — Telephone Encounter (Signed)
Dr Maryla Morrow, pt calls and states she does not feel 100% yet, still feels tired and weak plus coughing, has not finished medicine. 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 Please advise. Can you write her note to stay out til Monday or does she need a f/u appt?

## 2018-09-09 ENCOUNTER — Encounter: Payer: Self-pay | Admitting: Internal Medicine

## 2018-09-18 ENCOUNTER — Encounter: Payer: Self-pay | Admitting: Infectious Disease

## 2018-09-18 ENCOUNTER — Other Ambulatory Visit: Payer: Self-pay

## 2018-09-18 DIAGNOSIS — B20 Human immunodeficiency virus [HIV] disease: Secondary | ICD-10-CM

## 2018-09-19 LAB — T-HELPER CELL (CD4) - (RCID CLINIC ONLY)
CD4 T CELL HELPER: 32 % — AB (ref 33–55)
CD4 T Cell Abs: 680 /uL (ref 400–2700)

## 2018-09-23 ENCOUNTER — Encounter: Payer: Self-pay | Admitting: Internal Medicine

## 2018-09-23 ENCOUNTER — Ambulatory Visit: Payer: Self-pay

## 2018-09-23 LAB — CBC WITH DIFFERENTIAL/PLATELET
ABSOLUTE MONOCYTES: 1003 {cells}/uL — AB (ref 200–950)
Basophils Absolute: 64 cells/uL (ref 0–200)
Basophils Relative: 0.7 %
Eosinophils Absolute: 552 cells/uL — ABNORMAL HIGH (ref 15–500)
Eosinophils Relative: 6 %
HCT: 36.2 % (ref 35.0–45.0)
Hemoglobin: 12 g/dL (ref 11.7–15.5)
Lymphs Abs: 2291 cells/uL (ref 850–3900)
MCH: 27.6 pg (ref 27.0–33.0)
MCHC: 33.1 g/dL (ref 32.0–36.0)
MCV: 83.2 fL (ref 80.0–100.0)
MPV: 10.6 fL (ref 7.5–12.5)
Monocytes Relative: 10.9 %
Neutro Abs: 5290 cells/uL (ref 1500–7800)
Neutrophils Relative %: 57.5 %
Platelets: 289 10*3/uL (ref 140–400)
RBC: 4.35 10*6/uL (ref 3.80–5.10)
RDW: 15 % (ref 11.0–15.0)
Total Lymphocyte: 24.9 %
WBC: 9.2 10*3/uL (ref 3.8–10.8)

## 2018-09-23 LAB — COMPLETE METABOLIC PANEL WITH GFR
AG Ratio: 1.2 (calc) (ref 1.0–2.5)
ALT: 17 U/L (ref 6–29)
AST: 15 U/L (ref 10–35)
Albumin: 4 g/dL (ref 3.6–5.1)
Alkaline phosphatase (APISO): 61 U/L (ref 33–115)
BUN: 13 mg/dL (ref 7–25)
CO2: 27 mmol/L (ref 20–32)
Calcium: 9.5 mg/dL (ref 8.6–10.2)
Chloride: 105 mmol/L (ref 98–110)
Creat: 0.9 mg/dL (ref 0.50–1.10)
GFR, Est African American: 88 mL/min/{1.73_m2} (ref >=60)
GFR, Est Non African American: 76 mL/min/{1.73_m2} (ref >=60)
GLOBULIN: 3.3 g/dL (ref 1.9–3.7)
Glucose, Bld: 91 mg/dL (ref 65–99)
Potassium: 3.8 mmol/L (ref 3.5–5.3)
Sodium: 139 mmol/L (ref 135–146)
Total Bilirubin: 0.3 mg/dL (ref 0.2–1.2)
Total Protein: 7.3 g/dL (ref 6.1–8.1)

## 2018-09-23 LAB — HIV-1 RNA QUANT-NO REFLEX-BLD
HIV 1 RNA Quant: <20 copies/mL
HIV-1 RNA Quant, Log: <1.3 Log copies/mL

## 2018-09-25 ENCOUNTER — Ambulatory Visit (HOSPITAL_COMMUNITY)
Admission: EM | Admit: 2018-09-25 | Discharge: 2018-09-25 | Disposition: A | Discharge status: Home / Self Care | Payer: Self-pay | Attending: Family Medicine | Admitting: Family Medicine

## 2018-09-25 ENCOUNTER — Encounter (HOSPITAL_COMMUNITY): Payer: Self-pay

## 2018-09-25 ENCOUNTER — Ambulatory Visit (INDEPENDENT_AMBULATORY_CARE_PROVIDER_SITE_OTHER): Payer: Self-pay

## 2018-09-25 ENCOUNTER — Other Ambulatory Visit: Payer: Self-pay

## 2018-09-25 DIAGNOSIS — B349 Viral infection, unspecified: Secondary | ICD-10-CM | POA: Insufficient documentation

## 2018-09-25 NOTE — ED Triage Notes (Signed)
Pt cc chills , fever , vomiting, headaches and body aches x 3 days.

## 2018-09-25 NOTE — Discharge Instructions (Addendum)
Your x-ray was negative for any pneumonia This is most likely a viral illness Keep taking the Tylenol and ibuprofen for body aches and fever Follow up as needed for continued or worsening symptoms

## 2018-09-25 NOTE — ED Provider Notes (Signed)
[facility]    CSN: [id number] Arrival date & time: 09/25/18  0881     History   Chief Complaint Chief Complaint  Patient presents with  . Influenza    HPI Monique [name] is a 48 y.o. female.   Patient is a 48 year old female that presents with 3 days of chills, myalgias, fever, vomiting.  Her symptoms have been waxing and waning.  Her biggest complaint is the body aches.  Her fever has subsided.  She has not vomited today.  She denies any associated cough, congestion, rhinorrhea, diarrhea.  Unsure of any recent sick contacts.  Denies any recent traveling.  She has been taking ibuprofen for the fever and body aches with moderate relief of symptoms.  She does not smoke.  ROS per HPI      Past Medical History:  Diagnosis Date  . Chest pressure 09/16/2017  . Exertional chest pain 05/13/2017  . Headache 02/06/2017  . HIV (human immunodeficiency virus infection) (HCC)   . Hypertension   . 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 Lower extremity edema 01/12/2015  . Lung abscess (HCC)   . Nausea 02/06/2017  . Overweight 01/12/2015  . Pneumonia   . TB (tuberculosis)   . Tonsillitis   . Viral URI 09/16/2017    Patient Active Problem List   Diagnosis Date Noted  . Sinusitis 08/29/2018  . Myalgia 07/04/2018  . Healthcare maintenance 05/19/2018  . Viral URI 09/16/2017  . Exertional chest pain 05/13/2017  . Nasal congestion 02/13/2017  . Headache 02/06/2017  . Obese 08/10/2015  . Essential hypertension 01/12/2015  . 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 Human immunodeficiency virus (HIV) disease (HCC) 04/10/2013    Past Surgical History:  Procedure Laterality Date  . CESAREAN SECTION    . VIDEO BRONCHOSCOPY Bilateral 04/13/2013   Procedure: VIDEO BRONCHOSCOPY WITHOUT FLUORO;  Surgeon: Kalman Shan, MD;  Location: [facility] END[facility]COPY;  Service: Cardiopulmonary;  Laterality:  Bilateral;    OB History   No obstetric history on file.      Home Medications    Prior to Admission medications   Medication Sig Start Date End Date Taking? Authorizing Provider  acetaminophen (TYLENOL) 500 MG tablet Take 2 tablets (1,000 mg total) by mouth every 8 (eight) hours as needed for mild pain or fever. 07/04/18   Dorrell, Cathleen Corti, MD  amLODipine (NORVASC) 10 MG tablet Take 1 tablet (10 mg total) by mouth daily. IM PROGRAM 07/08/18   Synetta Shadow, MD  BIKTARVY [id number] MG TABS tablet TAKE 1 TABLET BY MOUTH DAILY 03/31/18   Daiva Eves, Lisette Grinder, MD  cetirizine (ZYRTEC ALLERGY) 10 MG tablet Take 1 tablet (10 mg total) by mouth daily. Patient not taking: Reported on 09/25/2018 02/05/18   Wallis Bamberg, PA-C  fluticasone [facility]) 50 MCG/ACT nasal spray Place 2 sprays into both nostrils daily. 05/19/18 05/19/19  Synetta Shadow, MD  guaiFENesin (MUCINEX) 600 MG 12 hr tablet Take 1 tablet (600 mg total) by mouth 2 (two) times daily as needed for cough. Patient not taking: Reported on 09/25/2018 08/29/18 08/29/19  Chevis Pretty, MD  hydrochlorothiazide (HYDRODIURIL) 25 MG tablet Take 1 tablet (25 mg total) by mouth daily. IM PROGRAM Patient not taking: Reported on 09/25/2018 07/08/18   Synetta Shadow, MD  HYDROcodone-homatropine [facility]) 5-1.5 MG/5ML syrup Take 5 mLs by mouth every 6 (six) hours as needed for cough. 07/25/18   Mardella Layman, MD  ibuprofen (  ADVIL) 200 MG tablet Take 1 tablet (200 mg total) by mouth every 6 (six) hours as needed. 1-2 tablet Po q 6 h PRN Patient not taking: Reported on 09/25/2018 08/29/18 08/29/19  Masoudi, Shawna OrleansElhamalsadat, MD  predniSONE (STERAPRED UNI-PAK 21 TAB) 10 MG (21) TBPK tablet Take by mouth daily. Take as directed. Patient not taking: Reported on 09/25/2018 07/25/18   Mardella LaymanHagler, Brian, MD    Family History Family History  Problem Relation Age of Onset  . Diabetes Sister   . Hypertension Sister     Social History Social History   Tobacco Use    . Smoking status: Former Smoker    Last attempt to quit: 12/22/2013    Years since quitting: 4.7  . Smokeless tobacco: Never Used  Substance Use Topics  . Alcohol use: No    Alcohol/week: 0.0 standard drinks  . Drug use: No     Allergies   Patient has no known allergies.   Review of Systems Review of Systems   Physical Exam Triage Vital Signs ED Triage Vitals  Enc Vitals Group     BP 09/25/18 0858 (!) 153/103     Pulse Rate 09/25/18 0858 83     Resp 09/25/18 0858 18     Temp 09/25/18 0858 98.3 F (36.8 C)     Temp Source 09/25/18 0858 Oral     SpO2 09/25/18 0858 98 %     Weight 09/25/18 0856 220 lb (99.8 kg)     Height --      Head Circumference --      Peak Flow --      Pain Score 09/25/18 0856 8     Pain Loc --      Pain Edu? --      Excl. in GC? --    No data found.  Updated Vital Signs BP (!) 153/103 (BP Location: Right Arm)   Pulse 83   Temp 98.3 F (36.8 C) (Oral)   Resp 18   Wt 220 lb (99.8 kg)   LMP 08/28/2018   SpO2 98%   BMI 41.57 kg/m   Visual Acuity Right Eye Distance:   Left Eye Distance:   Bilateral Distance:    Right Eye Near:   Left Eye Near:    Bilateral Near:     Physical Exam Vitals signs and nursing note reviewed.  Constitutional:      General: She is not in acute distress.    Appearance: She is well-developed.  HENT:     Head: Normocephalic and atraumatic.     Nose: Nose normal.     Mouth/Throat:     Pharynx: Oropharynx is clear.  Eyes:     Conjunctiva/sclera: Conjunctivae normal.  Neck:     Musculoskeletal: Neck supple.  Cardiovascular:     Rate and Rhythm: Normal rate and regular rhythm.     Heart sounds: Normal heart sounds. No murmur.  Pulmonary:     Effort: Pulmonary effort is normal. No respiratory distress.     Breath sounds: Wheezing present.     Comments: Diffuse mild expiratory wheezing.  Abdominal:     Palpations: Abdomen is soft.     Tenderness: There is no abdominal tenderness.  Musculoskeletal:  Normal range of motion.  Skin:    General: Skin is warm and dry.  Neurological:     Mental Status: She is alert.  Psychiatric:        Mood and Affect: Mood normal.      UC Treatments /  Results  Labs (all labs ordered are listed, but only abnormal results are displayed) Labs Reviewed - No data to display  EKG None  Radiology Dg Chest 2 View  Result Date: 09/25/2018 CLINICAL DATA:  Fever and chills. EXAM: CHEST - 2 VIEW COMPARISON:  February 05, 2018 FINDINGS: The heart size and mediastinal contours are within normal limits. There is no focal infiltrate, pulmonary edema, or pleural effusion. Scarring of the left upper lobe is unchanged. The visualized skeletal structures are unremarkable. IMPRESSION: No active cardiopulmonary disease. Stable scarring of the left upper lobe. Electronically Signed   By: Sherian ReinWei-Chen  Lin M.D.   On: 09/25/2018 09:47    Procedures Procedures (including critical care time)  Medications Ordered in UC Medications - No data to display  Initial Impression / Assessment and Plan / UC Course  I have reviewed the triage vital signs and the nursing notes.  Pertinent labs & imaging results that were available during my care of the patient were reviewed by me and considered in my medical decision making (see chart for details).     Chest x ray normal. This is most likely a viral illness She can continue the symptomatic treatment Work note given  Final Clinical Impressions(s) / UC Diagnoses   Final diagnoses:  Viral illness     Discharge Instructions     Your x-ray was negative for any pneumonia This is most likely a viral illness Keep taking the Tylenol and ibuprofen for body aches and fever Follow up as needed for continued or worsening symptoms     ED Prescriptions    None     Controlled Substance Prescriptions [place] Controlled Substance Registry consulted? Not Applicable   Janace ArisBast, Sorina Derrig A, NP 09/25/18 1002

## 2018-09-26 ENCOUNTER — Ambulatory Visit: Payer: Self-pay

## 2018-09-26 ENCOUNTER — Other Ambulatory Visit: Payer: Self-pay | Admitting: Infectious Disease

## 2018-09-26 ENCOUNTER — Encounter: Payer: Self-pay | Admitting: Internal Medicine

## 2018-09-26 DIAGNOSIS — B20 Human immunodeficiency virus [HIV] disease: Secondary | ICD-10-CM

## 2018-09-30 ENCOUNTER — Other Ambulatory Visit: Payer: Self-pay

## 2018-09-30 ENCOUNTER — Ambulatory Visit: Payer: Self-pay | Admitting: Internal Medicine

## 2018-09-30 DIAGNOSIS — I10 Essential (primary) hypertension: Secondary | ICD-10-CM

## 2018-09-30 DIAGNOSIS — Z79899 Other long term (current) drug therapy: Secondary | ICD-10-CM

## 2018-09-30 MED ORDER — AMLODIPINE BESYLATE 10 MG PO TABS: 10.0000 mg | ORAL_TABLET | Freq: Every day | ORAL | 2 refills | Status: DC

## 2018-09-30 MED ORDER — HYDROCHLOROTHIAZIDE 25 MG PO TABS: 25.0000 mg | ORAL_TABLET | Freq: Every day | ORAL | 2 refills | Status: DC

## 2018-09-30 NOTE — Patient Instructions (Addendum)
[name],   Your blood pressure was very high during today's visit.  Therefore we will need to start you on another blood pressure medication.  I sent a prescription for hydrochlorothiazide 25 mg.  You will take 1 tablet of this medication every day.  Please continue taking amlodipine as you usually do.  Make a follow-up appointment with us in 4 weeks to recheck your blood pressure.  Please call us if you have any questions or concerns.  - Dr. Evelene CroonSantos

## 2018-10-01 ENCOUNTER — Encounter: Payer: Self-pay | Admitting: Internal Medicine

## 2018-10-01 NOTE — Progress Notes (Signed)
   CC: HTN follow up  HPI:  [name] is a 48 y.o.   Past Medical History:  Diagnosis Date  . Chest pressure 09/16/2017  . Exertional chest pain 05/13/2017  . Headache 02/06/2017  . HIV (human immunodeficiency virus infection) (HCC)   . Hypertension   . 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 Lower extremity edema 01/12/2015  . Lung abscess (HCC)   . Nausea 02/06/2017  . Overweight 01/12/2015  . Pneumonia   . TB (tuberculosis)   . Tonsillitis   . Viral URI 09/16/2017   Review of Systems:   Review of Systems  Respiratory: Negative for shortness of breath.   Cardiovascular: Negative for chest pain, palpitations and leg swelling.  Neurological: Negative for dizziness and headaches.    Physical Exam:  Vitals:   09/30/18 1509 09/30/18 1524  BP: (!) 166/102 (!) 153/101  Pulse: 88 87  Temp: 99.1 F (37.3 C)   TempSrc: Oral   SpO2: 97%   Weight: 233 lb 12.8 oz (106.1 kg)   Height: 5' 2.5" (1.588 m)    General: Well-appearing female in no acute distress CV:RRR, no mrg  Pulm: CTAB, no wheezes or crackles, normal work of breathing ExT: Warm and well-perfused without pitting edema  Assessment & Plan:   See Encounters Tab for problem based charting.  Patient discussed with Dr. Cleda Daub

## 2018-10-01 NOTE — Assessment & Plan Note (Signed)
Monique [name] presents for hypertension follow-up.  She was previously on Zestoretic and amlodipine.  Per her report, she was experiencing side effects from the lisinopril component of Zestoretic and this was discontinued in 04/2018.  At that time losartan was started.  Patient reports she discontinued Zestoretic but never started taking losartan and has been on amlodipine 10 since then.   There is a note from 06/2018 from Dr. Criss Alvine where patient reported being confused about her antihypertensive regimen and further adjustments were made.  She was continued on amlodipine and started on HCTZ.  Losartan was discontinued than since she never took it.  She presents today stating that she never started HCTZ as she was not aware she was supposed to be on this.  She does not remember filling this medication at her pharmacy.  We will continue amlodipine and I sent a new prescription for HCTZ 25 mg to her pharmacy.  Discussed dosing and side effects with patient.  She verbalized understanding was able to teach back how to take both medications.

## 2018-10-02 NOTE — Progress Notes (Signed)
Internal Medicine Clinic Attending  Case discussed with Dr. Santos-Sanchez at the time of the visit.  We reviewed the resident's history and exam and pertinent patient test results.  I agree with the assessment, diagnosis, and plan of care documented in the resident's note.    

## 2018-10-14 ENCOUNTER — Encounter: Payer: Self-pay | Admitting: Infectious Diseases

## 2018-10-15 ENCOUNTER — Encounter: Payer: Self-pay | Admitting: Infectious Disease

## 2018-10-27 ENCOUNTER — Ambulatory Visit (INDEPENDENT_AMBULATORY_CARE_PROVIDER_SITE_OTHER): Payer: Self-pay | Admitting: Infectious Disease

## 2018-10-27 ENCOUNTER — Encounter: Payer: Self-pay | Admitting: Infectious Disease

## 2018-10-27 VITALS — BP 130/96 | HR 93 | Temp 98.0°F

## 2018-10-27 DIAGNOSIS — I10 Essential (primary) hypertension: Secondary | ICD-10-CM

## 2018-10-27 DIAGNOSIS — Z113 Encounter for screening for infections with a predominantly sexual mode of transmission: Secondary | ICD-10-CM

## 2018-10-27 DIAGNOSIS — Z23 Encounter for immunization: Secondary | ICD-10-CM

## 2018-10-27 DIAGNOSIS — B20 Human immunodeficiency virus [HIV] disease: Secondary | ICD-10-CM

## 2018-10-27 DIAGNOSIS — R635 Abnormal weight gain: Secondary | ICD-10-CM

## 2018-10-27 DIAGNOSIS — Z79899 Other long term (current) drug therapy: Secondary | ICD-10-CM

## 2018-10-27 HISTORY — DX: Abnormal weight gain: R63.5

## 2018-10-27 MED ORDER — HYDROCHLOROTHIAZIDE 25 MG PO TABS: 25.0000 mg | ORAL_TABLET | Freq: Every day | ORAL | 11 refills | Status: DC

## 2018-10-27 MED ORDER — BICTEGRAVIR-EMTRICITAB-TENOFOV 50-200-25 MG PO TABS: 1.0000 | ORAL_TABLET | Freq: Every day | ORAL | 11 refills | Status: DC

## 2018-10-27 MED ORDER — AMLODIPINE BESYLATE 10 MG PO TABS: 10.0000 mg | ORAL_TABLET | Freq: Every day | ORAL | 11 refills | Status: DC

## 2018-10-27 NOTE — Progress Notes (Signed)
Chief complaint : Weight gain. Subjective:    Patient ID: Monique [name], female    DOB: 18-Jan-1971, 48 y.o.   MRN: [id number]  HPI  89 -year-old [place] lady with HIV AIDS ( August 2014) also with lung abscess . She was initially treated for both lung abscess and possibility of pulmonary tuberculosis. See prior notes for workup ultimately all data was negative for pulmonary tuberculosis and she did finish a course of therapy for latent tuberculosis.  She also had protracted Augmentin .  Her HIV has been well controlled most recently on BIKTARVY.  She did experience significant weight gain on antiretroviral therapy with integrates inhibitor initially.  Weight was as low as 150 pounds and in February 2014 but rose to 203 by August 2015 and now in the 230s.  She is upset that she weighs this much.  She has eaten a diet has a fair amount of carbohydrates and including fried chicken and sugar containing sodas.  We discussed the current literature around weight gain on patients with HIV initiated on integrates strand transfer inhibitors.  Option of entering into a clinical trial [facility]  plus Truvada versus DESCOVY.    Past Medical History:  Diagnosis Date  . Chest pressure 09/16/2017  . Exertional chest pain 05/13/2017  . Headache 02/06/2017  . HIV (human immunodeficiency virus infection) (HCC)   . Hypertension   . 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 Lower extremity edema 01/12/2015  . Lung abscess (HCC)   . Nausea 02/06/2017  . Overweight 01/12/2015  . Pneumonia   . TB (tuberculosis)   . Tonsillitis   . Viral URI 09/16/2017    Past Surgical History:  Procedure Laterality Date  . CESAREAN SECTION    . VIDEO BRONCHOSCOPY Bilateral 04/13/2013   Procedure: VIDEO BRONCHOSCOPY WITHOUT FLUORO;  Surgeon: Kalman ShanMurali Ramaswamy, MD;  [facility];  Service: Cardiopulmonary;  Laterality: Bilateral;    Family  History  Problem Relation Age of Onset  . Diabetes Sister   . Hypertension Sister       Social History   Socioeconomic History  . Marital status: Single    Spouse name: Not on file  . Number of children: Not on file  . Years of education: Not on file  . Highest education level: Not on file  Occupational History  . Not on file  Social Needs  . Financial resource strain: Not on file  . Food insecurity:    Worry: Not on file    Inability: Not on file  . Transportation needs:    Medical: Not on file    Non-medical: Not on file  Tobacco Use  . Smoking status: Former Smoker    Last attempt to quit: 12/22/2013    Years since quitting: 4.8  . Smokeless tobacco: Never Used  Substance and Sexual Activity  . Alcohol use: No    Alcohol/week: 0.0 standard drinks  . Drug use: No  . Sexual activity: Yes    Partners: Male  Lifestyle  . Physical activity:    Days per week: Not on file    Minutes per session: Not on file  . Stress: Not on file  Relationships  . Social connections:    Talks on phone: Not on file    Gets together: Not on file    Attends religious service: Not on file    Active member of club or organization: Not on file    Attends meetings  of clubs or organizations: Not on file    Relationship status: Not on file  Other Topics Concern  . Not on file  Social History Narrative  . Not on file    No Known Allergies   Current Outpatient Medications:  .  acetaminophen (TYLENOL) 500 MG tablet, Take 2 tablets (1,000 mg total) by mouth every 8 (eight) hours as needed for mild pain or fever., Disp: 30 tablet, Rfl: 0 .  amLODipine (NORVASC) 10 MG tablet, Take 1 tablet (10 mg total) by mouth daily., Disp: 90 tablet, Rfl: 2 .  BIKTARVY 50-200-25 MG TABS tablet, TAKE 1 TABLET BY MOUTH DAILY, Disp: 30 tablet, Rfl: 3 .  fluticasone (FLONASE) 50 MCG/ACT nasal spray, Place 2 sprays into both nostrils daily., Disp: 1 g, Rfl: 0 .  hydrochlorothiazide (HYDRODIURIL) 25 MG tablet,  Take 1 tablet (25 mg total) by mouth daily., Disp: 30 tablet, Rfl: 2 .  HYDROcodone-homatropine (HYCODAN) 5-1.5 MG/5ML syrup, Take 5 mLs by mouth every 6 (six) hours as needed for cough., Disp: 60 mL, Rfl: 0   Review of Systems  Constitutional: Negative for activity change, appetite change, chills, diaphoresis, fatigue and unexpected weight change.  HENT: Negative for facial swelling, rhinorrhea, sinus pressure, sneezing and trouble swallowing.   Eyes: Negative for photophobia and visual disturbance.  Respiratory: Positive for cough. Negative for chest tightness, shortness of breath and stridor.   Cardiovascular: Negative for chest pain, palpitations and leg swelling.  Gastrointestinal: Negative for abdominal distention, anal bleeding, blood in stool, constipation and nausea.  Genitourinary: Negative for difficulty urinating, dysuria, flank pain and hematuria.  Musculoskeletal: Negative for arthralgias, back pain, gait problem, joint swelling and myalgias.  Skin: Negative for color change, pallor and wound.  Neurological: Negative for dizziness, tremors, weakness, light-headedness and headaches.  Hematological: Negative for adenopathy. Does not bruise/bleed easily.  Psychiatric/Behavioral: Negative for agitation, behavioral problems, confusion, decreased concentration, dysphoric mood and sleep disturbance.       Objective:   Physical Exam Constitutional:      General: She is not in acute distress.    Appearance: She is well-developed. She is not diaphoretic.  HENT:     Head: Normocephalic and atraumatic.  Eyes:     General: No scleral icterus.    Conjunctiva/sclera: Conjunctivae normal.     Pupils: Pupils are equal, round, and reactive to light.  Neck:     Musculoskeletal: Normal range of motion and neck supple.  Cardiovascular:     Rate and Rhythm: Normal rate and regular rhythm.     Heart sounds: Normal heart sounds. No murmur. No friction rub. No gallop.   Pulmonary:      Effort: Pulmonary effort is normal. No respiratory distress.     Breath sounds: Normal breath sounds. No wheezing or rales.  Chest:     Chest wall: No tenderness.  Abdominal:     General: Bowel sounds are normal. There is no distension.     Palpations: Abdomen is soft.  Musculoskeletal:        General: No tenderness.  Skin:    General: Skin is warm.  Neurological:     Mental Status: She is alert and oriented to person, place, and time.     Motor: No abnormal muscle tone.     Coordination: Coordination normal.  Psychiatric:        Mood and Affect: Mood is anxious.        Speech: Speech is not slurred.        Behavior:  Behavior normal.        Thought Content: Thought content normal.        Judgment: Judgment normal.        Assessment & Plan:    HIV/AIDS: well controlled Continue BIKTARVY  HMAP has been renewed  Hypertension: Renewed Scripts  for hypertension There were no vitals filed for this visit.   Weight gain and obesity: She states that diabetes runs in her family.  I have asked her to start a carbohydrate restricted diet.  I also think she should be considered for the ACT G clinical trial I will send a note to Deirdre Evener  I spent greater than 25 minutes with the patient including greater than 50% of time in face to face counsel of the patient adding data on weight gain and integrates strand transfer inhibitors, data on how to lose weight using carbohydrate restricted diets versus "starvation diets and in coordination of her care.

## 2018-10-27 NOTE — Patient Instructions (Signed)
Make appt with Rexene Alberts for PAP smear and pelvic exam  We need to give you a Tetanus shot  Try a LOW CARBOHYDRATE diet such as "Atkins" "Keto" diet. Typically the first parts of these diets involve eating LESS than 20grams of carbohydrate a day  Make sure you take in plenty of fluids and you may need to take a stool softener or fiber to stay hydrated and also to avoid constipation if you try one of these

## 2018-10-31 NOTE — Addendum Note (Signed)
Addended by: Lurlean Leyden on: 10/31/2018 12:49 PM   Modules accepted: Orders

## 2018-11-07 ENCOUNTER — Ambulatory Visit: Payer: Self-pay

## 2018-11-07 ENCOUNTER — Telehealth: Payer: Self-pay | Admitting: Internal Medicine

## 2018-11-07 NOTE — Telephone Encounter (Signed)
Pt missed call yesterday afternoon, pls return call to pt [phone]

## 2018-11-10 ENCOUNTER — Other Ambulatory Visit: Payer: Self-pay | Admitting: Internal Medicine

## 2018-11-10 DIAGNOSIS — J069 Acute upper respiratory infection, unspecified: Secondary | ICD-10-CM

## 2018-11-10 MED ORDER — GUAIFENESIN-CODEINE 100-10 MG/5ML PO SYRP: 5.0000 mL | ORAL_SOLUTION | Freq: Three times a day (TID) | ORAL | 0 refills | Status: DC | PRN

## 2018-11-10 NOTE — Telephone Encounter (Signed)
Thanks! I have sent a cough suppressant RX to [facility] outpatient pharmacy.

## 2018-11-10 NOTE — Telephone Encounter (Signed)
Returned call to patient. States her grandson stayed home 2 days last week due to cough. He was given cough med by his doctor. Now pt with cough x 2 days intermittently productive of green sputum. Neither she nor her grandson have had fever, chills or body aches. Patient has appt in [facility] tomorrow but would like to cancel due to Covid restrictions. She is requesting cough med at [facility] pharmacy. Will send request to PCP. Kinnie Feil, RN, BSN   In the 14 days prior to your symptom onset, did you travel to an area with a high prevalence of COVID? No  In the 14 days prior to your symptom onset, did you have close contact with someone with COVID? No  Do you have a fever? No  Do you have a cough? Yes - productive Do you have shortness of breath more than normal? No Do you have chest pain?No Are you able to eat and drink normally? Yes Have you seen a physician for these symptoms? No  Does patient need flu / RVP testing? No Does patient need COVID testing?  No  I have reviewed the patients PMHx and medications.  Patient's clinical status appears to be Mild   Patient has been instructed to stay well hydrated and get plenty of rest.

## 2018-11-10 NOTE — Telephone Encounter (Signed)
Attempted to make patient aware that med had been sent. No answer and VM box not set up. Kinnie Feil, RN, BSN

## 2018-11-11 ENCOUNTER — Ambulatory Visit: Payer: Self-pay

## 2018-11-25 ENCOUNTER — Ambulatory Visit (HOSPITAL_COMMUNITY)
Admission: EM | Admit: 2018-11-25 | Discharge: 2018-11-25 | Disposition: A | Discharge status: Home / Self Care | Payer: Self-pay | Attending: Family Medicine | Admitting: Family Medicine

## 2018-11-25 ENCOUNTER — Other Ambulatory Visit: Payer: Self-pay

## 2018-11-25 ENCOUNTER — Encounter (HOSPITAL_COMMUNITY): Payer: Self-pay | Admitting: Emergency Medicine

## 2018-11-25 DIAGNOSIS — B9789 Other viral agents as the cause of diseases classified elsewhere: Secondary | ICD-10-CM

## 2018-11-25 DIAGNOSIS — J069 Acute upper respiratory infection, unspecified: Secondary | ICD-10-CM

## 2018-11-25 LAB — POCT RAPID STREP A: Streptococcus, Group A Screen (Direct): NEGATIVE

## 2018-11-25 MED ORDER — BENZONATATE 200 MG PO CAPS: 200.0000 mg | ORAL_CAPSULE | Freq: Three times a day (TID) | ORAL | 0 refills | Status: AC | PRN

## 2018-11-25 MED ORDER — LORATADINE 10 MG PO TABS: 10.0000 mg | ORAL_TABLET | Freq: Every day | ORAL | 0 refills | Status: DC

## 2018-11-25 NOTE — ED Triage Notes (Signed)
Sore throat Chills Cough Unknown fever Symptoms started this morning

## 2018-11-25 NOTE — ED Provider Notes (Signed)
[facility]    CSN: [id number] Arrival date & time: 11/25/18  1446     History   Chief Complaint Chief Complaint  Patient presents with  . Cough    HPI Monique [name] is a 48 y.o. female history of HIV, hypertension, presenting today for evaluation of sore throat and cough.  Patient states that since this morning she has developed a sore throat as well as a mild cough.  She feels swelling in her throat which is the main reason for her visit.  She denies shortness of breath or chest pain.  Denies any known fevers.  She has not taken any medicines for symptoms.  Denies nausea vomiting or diarrhea. Denies any recent travel, denies any known exposure to COVID-19.  Patient does work at [employer] and has continued to work in the recent weeks.   HPI  Past Medical History:  Diagnosis Date  . Chest pressure 09/16/2017  . Exertional chest pain 05/13/2017  . Headache 02/06/2017  . HIV (human immunodeficiency virus infection) (HCC)   . Hypertension   . 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 Lower extremity edema 01/12/2015  . Lung abscess (HCC)   . Nausea 02/06/2017  . Overweight 01/12/2015  . Pneumonia   . TB (tuberculosis)   . Tonsillitis   . Viral URI 09/16/2017  . Weight gain 10/27/2018    Patient Active Problem List   Diagnosis Date Noted  . Weight gain 10/27/2018  . Sinusitis 08/29/2018  . Myalgia 07/04/2018  . Healthcare maintenance 05/19/2018  . Viral URI 09/16/2017  . Exertional chest pain 05/13/2017  . Nasal congestion 02/13/2017  . Headache 02/06/2017  . Obese 08/10/2015  . Essential hypertension 01/12/2015  . 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 Human immunodeficiency virus (HIV) disease (HCC) 04/10/2013    Past Surgical History:  Procedure Laterality Date  . CESAREAN SECTION    . VIDEO BRONCHOSCOPY Bilateral 04/13/2013   Procedure: VIDEO BRONCHOSCOPY WITHOUT FLUORO;   Surgeon: Kalman Shan, MD;  Location: [facility] ENDOSCOPY;  Service: Cardiopulmonary;  Laterality: Bilateral;    OB History   No obstetric history on file.      Home Medications    Prior to Admission medications   Medication Sig Start Date End Date Taking? Authorizing Provider  acetaminophen (TYLENOL) 500 MG tablet Take 2 tablets (1,000 mg total) by mouth every 8 (eight) hours as needed for mild pain or fever. 07/04/18   Dorrell, Cathleen Corti, MD  amLODipine (NORVASC) 10 MG tablet Take 1 tablet (10 mg total) by mouth daily. 10/27/18   Randall Hiss, MD  benzonatate (TESSALON) 200 MG capsule Take 1 capsule (200 mg total) by mouth 3 (three) times daily as needed for up to 7 days for cough. 11/25/18 12/02/18  Sareena Odeh C, PA-C  bictegravir-emtricitabine-tenofovir AF (BIKTARVY) 50-200-25 MG TABS tablet Take 1 tablet by mouth daily. 10/27/18   Randall Hiss, MD  fluticasone [facility]) 50 MCG/ACT nasal spray Place 2 sprays into both nostrils daily. 05/19/18 05/19/19  Synetta Shadow, MD  guaiFENesin-codeine [facility]) 100-10 MG/5ML syrup Take 5 mLs by mouth 3 (three) times daily as needed for cough. 11/10/18   Synetta Shadow, MD  hydrochlorothiazide (HYDRODIURIL) 25 MG tablet Take 1 tablet (25 mg total) by mouth daily. 10/27/18   Randall Hiss, MD  HYDROcodone-homatropine [facility]) 5-1.5 MG/5ML syrup Take 5 mLs by mouth every 6 (six) hours as needed  for cough. 07/25/18   Mardella Layman, MD  loratadine (CLARITIN) 10 MG tablet Take 1 tablet (10 mg total) by mouth daily. 11/25/18   Yevonne Yokum, Junius Creamer, PA-C    Family History Family History  Problem Relation Age of Onset  . Diabetes Sister   . Hypertension Sister     Social History Social History   Tobacco Use  . Smoking status: Former Smoker    Last attempt to quit: 12/22/2013    Years since quitting: 4.9  . Smokeless tobacco: Never Used  Substance Use Topics  . Alcohol use: No    Alcohol/week: 0.0 standard drinks  . Drug  use: No     Allergies   Patient has no known allergies.   Review of Systems Review of Systems  Constitutional: Negative for activity change, appetite change, chills, fatigue and fever.  HENT: Positive for sore throat. Negative for congestion, ear pain, rhinorrhea, sinus pressure and trouble swallowing.   Eyes: Negative for discharge and redness.  Respiratory: Positive for cough. Negative for chest tightness and shortness of breath.   Cardiovascular: Negative for chest pain.  Gastrointestinal: Negative for abdominal pain, diarrhea, nausea and vomiting.  Musculoskeletal: Negative for myalgias.  Skin: Negative for rash.  Neurological: Negative for dizziness, light-headedness and headaches.     Physical Exam Triage Vital Signs ED Triage Vitals  Enc Vitals Group     BP 11/25/18 1503 (!) 159/104     Pulse Rate 11/25/18 1503 87     Resp 11/25/18 1503 20     Temp 11/25/18 1503 97.7 F (36.5 C)     Temp Source 11/25/18 1503 Oral     SpO2 11/25/18 1503 99 %     Weight --      Height --      Head Circumference --      Peak Flow --      Pain Score 11/25/18 1501 6     Pain Loc --      Pain Edu? --      Excl. in GC? --    No data found.  Updated Vital Signs BP (!) 154/85 (BP Location: Right Arm) Comment: large cuff, repositioning of patient  Pulse 87   Temp 97.7 F (36.5 C) (Oral)   Resp 20   SpO2 99%   Visual Acuity Right Eye Distance:   Left Eye Distance:   Bilateral Distance:    Right Eye Near:   Left Eye Near:    Bilateral Near:     Physical Exam Vitals signs and nursing note reviewed.  Constitutional:      General: She is not in acute distress.    Appearance: She is well-developed.  HENT:     Head: Normocephalic and atraumatic.     Ears:     Comments: Bilateral ears without tenderness to palpation of external auricle, tragus and mastoid, EAC's without erythema or swelling, TM's with good bony landmarks and cone of light. Non erythematous.     Nose:      Comments: Slightly erythematous nasal mucosa, nonswollen turbinates    Mouth/Throat:     Comments: Oral mucosa pink and moist, mild tonsillar enlargement with 2 exudate on right. Posterior pharynx patent and nonerythematous, no uvula deviation or swelling. Normal phonation. Eyes:     Conjunctiva/sclera: Conjunctivae normal.  Neck:     Musculoskeletal: Neck supple.  Cardiovascular:     Rate and Rhythm: Normal rate and regular rhythm.     Heart sounds: No murmur.  Pulmonary:  Effort: Pulmonary effort is normal. No respiratory distress.     Breath sounds: Normal breath sounds.     Comments: Breathing comfortably at rest, CTABL, no wheezing, rales or other adventitious sounds auscultated Abdominal:     Palpations: Abdomen is soft.     Tenderness: There is no abdominal tenderness.  Skin:    General: Skin is warm and dry.  Neurological:     Mental Status: She is alert.      UC Treatments / Results  Labs (all labs ordered are listed, but only abnormal results are displayed) Labs Reviewed  CULTURE, GROUP A STREP [facility])  POCT RAPID STREP A    EKG None  Radiology No results found.  Procedures Procedures (including critical care time)  Medications Ordered in UC Medications - No data to display  Initial Impression / Assessment and Plan / UC Course  I have reviewed the triage vital signs and the nursing notes.  Pertinent labs & imaging results that were available during my care of the patient were reviewed by me and considered in my medical decision making (see chart for details).    Strep test negative, vital signs stable, exam relatively nonfocal.  Most likely viral etiology.  Recommend symptomatic and supportive care, provided Claritin and Tessalon.  Advised to stay home until symptoms resolved.  Continue to monitor,Discussed strict return precautions. Patient verbalized understanding and is agreeable with plan.   Final Clinical Impressions(s) / UC Diagnoses   Final  diagnoses:  Viral URI with cough     Discharge Instructions     Sore Throat  Your rapid strep tested Negative today. We will send for a culture and call in about 2 days if results are positive.   May use Tessalon as needed for cough every 8 hours  Please continue Tylenol or Ibuprofen for fever and pain. May try salt water gargles, cepacol lozenges, throat spray, or OTC cold relief medicine for throat discomfort. If you also have congestion take a daily anti-histamine like Zyrtec, Claritin, and a oral decongestant to help with post nasal drip that may be irritating your throat.   Stay hydrated and drink plenty of fluids to keep your throat coated relieve irritation.      ED Prescriptions    Medication Sig Dispense Auth. Provider   benzonatate (TESSALON) 200 MG capsule Take 1 capsule (200 mg total) by mouth 3 (three) times daily as needed for up to 7 days for cough. 28 capsule Nakeisha Greenhouse C, PA-C   loratadine (CLARITIN) 10 MG tablet Take 1 tablet (10 mg total) by mouth daily. 15 tablet Aleksa Catterton, [place] C, PA-C     Controlled Substance Prescriptions [place] Controlled Substance Registry consulted? Not Applicable   Lew Dawes, [place] 11/25/18 1547

## 2018-11-25 NOTE — Discharge Instructions (Addendum)
Sore Throat  Your rapid strep tested Negative today. We will send for a culture and call in about 2 days if results are positive.   May use Tessalon as needed for cough every 8 hours  Please continue Tylenol or Ibuprofen for fever and pain. May try salt water gargles, cepacol lozenges, throat spray, or OTC cold relief medicine for throat discomfort. If you also have congestion take a daily anti-histamine like Zyrtec, Claritin, and a oral decongestant to help with post nasal drip that may be irritating your throat.   Stay hydrated and drink plenty of fluids to keep your throat coated relieve irritation.

## 2018-11-27 ENCOUNTER — Ambulatory Visit: Payer: Self-pay | Admitting: Infectious Diseases

## 2018-11-28 LAB — CULTURE, GROUP A STREP (THRC)

## 2019-01-17 ENCOUNTER — Ambulatory Visit (HOSPITAL_COMMUNITY)
Admission: EM | Admit: 2019-01-17 | Discharge: 2019-01-17 | Disposition: A | Discharge status: Home / Self Care | Payer: Self-pay | Attending: Family Medicine | Admitting: Family Medicine

## 2019-01-17 ENCOUNTER — Encounter (HOSPITAL_COMMUNITY): Payer: Self-pay | Admitting: Family Medicine

## 2019-01-17 DIAGNOSIS — B349 Viral infection, unspecified: Secondary | ICD-10-CM

## 2019-01-17 NOTE — ED Triage Notes (Signed)
Pt cc body aches , chills and cough  flu like symptoms. Pt needs work note. This has been going on for 3 days.

## 2019-01-17 NOTE — Discharge Instructions (Addendum)
Call [phone] to be seen for testing     Person Under Monitoring Name: Monique [name]  Location: [address] [place] [address]   Infection Prevention Recommendations for Individuals Confirmed to have, or Being Evaluated for, 2019 Novel Coronavirus (COVID-19) Infection Who Receive Care at Home  Individuals who are confirmed to have, or are being evaluated for, COVID-19 should follow the prevention steps below until a healthcare provider or local or state health department says they can return to normal activities.  Stay home except to get medical care You should restrict activities outside your home, except for getting medical care. Do not go to work, school, or public areas, and do not use public transportation or taxis.  Call ahead before visiting your doctor Before your medical appointment, call the healthcare provider and tell them that you have, or are being evaluated for, COVID-19 infection. This will help the healthcare providers office take steps to keep other people from getting infected. Ask your healthcare provider to call the local or state health department.  Monitor your symptoms Seek prompt medical attention if your illness is worsening (e.g., difficulty breathing). Before going to your medical appointment, call the healthcare provider and tell them that you have, or are being evaluated for, COVID-19 infection. Ask your healthcare provider to call the local or state health department.  Wear a facemask You should wear a facemask that covers your nose and mouth when you are in the same room with other people and when you visit a healthcare provider. People who live with or visit you should also wear a facemask while they are in the same room with you.  Separate yourself from other people in your home As much as possible, you should stay in a different room from other people in your home. Also, you should use a separate bathroom, if  available.  Avoid sharing household items You should not share dishes, drinking glasses, cups, eating utensils, towels, bedding, or other items with other people in your home. After using these items, you should wash them thoroughly with soap and water.  Cover your coughs and sneezes Cover your mouth and nose with a tissue when you cough or sneeze, or you can cough or sneeze into your sleeve. Throw used tissues in a lined trash can, and immediately wash your hands with soap and water for at least 20 seconds or use an alcohol-based hand rub.  Wash your [employer] your hands often and thoroughly with soap and water for at least 20 seconds. You can use an alcohol-based hand sanitizer if soap and water are not available and if your hands are not visibly dirty. Avoid touching your eyes, nose, and mouth with unwashed hands.   Prevention Steps for Caregivers and Household Members of Individuals Confirmed to have, or Being Evaluated for, COVID-19 Infection Being Cared for in the Home  If you live with, or provide care at home for, a person confirmed to have, or being evaluated for, COVID-19 infection please follow these guidelines to prevent infection:  Follow healthcare providers instructions Make sure that you understand and can help the patient follow any healthcare provider instructions for all care.  Provide for the patients basic needs You should help the patient with basic needs in the home and provide support for getting groceries, prescriptions, and other personal needs.  Monitor the patients symptoms If they are getting sicker, call his or her medical provider and tell them that the patient has, or is being evaluated for,  COVID-19 infection. This will help the healthcare providers office take steps to keep other people from getting infected. Ask the healthcare provider to call the local or state health department.  Limit the number of people who have contact with the  patient If possible, have only one caregiver for the patient. Other household members should stay in another home or place of residence. If this is not possible, they should stay in another room, or be separated from the patient as much as possible. Use a separate bathroom, if available. Restrict visitors who do not have an essential need to be in the home.  Keep older adults, very young children, and other sick people away from the patient Keep older adults, very young children, and those who have compromised immune systems or chronic health conditions away from the patient. This includes people with chronic heart, lung, or kidney conditions, diabetes, and cancer.  Ensure good ventilation Make sure that shared spaces in the home have good air flow, such as from an air conditioner or an opened window, weather permitting.  Wash your hands often Wash your hands often and thoroughly with soap and water for at least 20 seconds. You can use an alcohol based hand sanitizer if soap and water are not available and if your hands are not visibly dirty. Avoid touching your eyes, nose, and mouth with unwashed hands. Use disposable paper towels to dry your hands. If not available, use dedicated cloth towels and replace them when they become wet.  Wear a facemask and gloves Wear a disposable facemask at all times in the room and gloves when you touch or have contact with the patients blood, body fluids, and/or secretions or excretions, such as sweat, saliva, sputum, nasal mucus, vomit, urine, or feces.  Ensure the mask fits over your nose and mouth tightly, and do not touch it during use. Throw out disposable facemasks and gloves after using them. Do not reuse. Wash your hands immediately after removing your facemask and gloves. If your personal clothing becomes contaminated, carefully remove clothing and launder. Wash your hands after handling contaminated clothing. Place all used disposable facemasks,  gloves, and other waste in a lined container before disposing them with other household waste. Remove gloves and wash your hands immediately after handling these items.  Do not share dishes, glasses, or other household items with the patient Avoid sharing household items. You should not share dishes, drinking glasses, cups, eating utensils, towels, bedding, or other items with a patient who is confirmed to have, or being evaluated for, COVID-19 infection. After the person uses these items, you should wash them thoroughly with soap and water.  Wash laundry thoroughly Immediately remove and wash clothes or bedding that have blood, body fluids, and/or secretions or excretions, such as sweat, saliva, sputum, nasal mucus, vomit, urine, or feces, on them. Wear gloves when handling laundry from the patient. Read and follow directions on labels of laundry or clothing items and detergent. In general, wash and dry with the warmest temperatures recommended on the label.  Clean all areas the individual has used often Clean all touchable surfaces, such as counters, tabletops, doorknobs, bathroom fixtures, toilets, phones, keyboards, tablets, and bedside tables, every day. Also, clean any surfaces that may have blood, body fluids, and/or secretions or excretions on them. Wear gloves when cleaning surfaces the patient has come in contact with. Use a diluted bleach solution (e.g., dilute bleach with 1 part bleach and 10 parts water) or a household disinfectant with a label  that says EPA-registered for coronaviruses. To make a bleach solution at home, add 1 tablespoon of bleach to 1 quart (4 cups) of water. For a larger supply, add  cup of bleach to 1 gallon (16 cups) of water. Read labels of cleaning products and follow recommendations provided on product labels. Labels contain instructions for safe and effective use of the cleaning product including precautions you should take when applying the product, such as  wearing gloves or eye protection and making sure you have good ventilation during use of the product. Remove gloves and wash hands immediately after cleaning.  Monitor yourself for signs and symptoms of illness Caregivers and household members are considered close contacts, should monitor their health, and will be asked to limit movement outside of the home to the extent possible. Follow the monitoring steps for close contacts listed on the symptom monitoring form.   ? If you have additional questions, contact your local health department or call the epidemiologist on call at [phone] (available 24/7). ? This guidance is subject to change. For the most up-to-date guidance from [facility], please refer to their website: TripMetro.huhttps://www.cdc.gov/coronavirus/2019-ncov/hcp/guidance-prevent-spread.html

## 2019-01-17 NOTE — ED Provider Notes (Signed)
[facility]    CSN: [id number] Arrival date & time: 01/17/19  1153     History   Chief Complaint Chief Complaint  Patient presents with  . Influenza    HPI GENEE [name] is a 48 y.o. female.   The history is provided by the patient. No language interpreter was used.  Influenza  Presenting symptoms: cough and myalgias   Severity:  Moderate Onset quality:  Gradual Duration:  3 days Progression:  Worsening Chronicity:  New Relieved by:  Nothing Worsened by:  Nothing Associated symptoms: chills   Risk factors: immunocompromised state     Past Medical History:  Diagnosis Date  . Chest pressure 09/16/2017  . Exertional chest pain 05/13/2017  . Headache 02/06/2017  . HIV (human immunodeficiency virus infection) (HCC)   . Hypertension   . 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 Lower extremity edema 01/12/2015  . Lung abscess (HCC)   . Nausea 02/06/2017  . Overweight 01/12/2015  . Pneumonia   . TB (tuberculosis)   . Tonsillitis   . Viral URI 09/16/2017  . Weight gain 10/27/2018    Patient Active Problem List   Diagnosis Date Noted  . Weight gain 10/27/2018  . Sinusitis 08/29/2018  . Myalgia 07/04/2018  . Healthcare maintenance 05/19/2018  . Viral URI 09/16/2017  . Exertional chest pain 05/13/2017  . Nasal congestion 02/13/2017  . Headache 02/06/2017  . Obese 08/10/2015  . Essential hypertension 01/12/2015  . 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 Human immunodeficiency virus (HIV) disease (HCC) 04/10/2013    Past Surgical History:  Procedure Laterality Date  . CESAREAN SECTION    . VIDEO BRONCHOSCOPY Bilateral 04/13/2013   Procedure: VIDEO BRONCHOSCOPY WITHOUT FLUORO;  Surgeon: Kalman Shan, MD;  Location[facility];  Service: Cardiopulmonary;  Laterality: Bilateral;    OB History   No obstetric history on file.      Home Medications    Prior to  Admission medications   Medication Sig Start Date End Date Taking? Authorizing Provider  acetaminophen (TYLENOL) 500 MG tablet Take 2 tablets (1,000 mg total) by mouth every 8 (eight) hours as needed for mild pain or fever. 07/04/18   Dorrell, Cathleen Corti, MD  amLODipine (NORVASC) 10 MG tablet Take 1 tablet (10 mg total) by mouth daily. 10/27/18   Randall Hiss, MD  bictegravir-emtricitabine-tenofovir AF (BIKTARVY) 50-200-25 MG TABS tablet Take 1 tablet by mouth daily. 10/27/18   Randall Hiss, MD  fluticasone [facility]) 50 MCG/ACT nasal spray Place 2 sprays into both nostrils daily. 05/19/18 05/19/19  Synetta Shadow, MD  guaiFENesin-codeine [facility]) 100-10 MG/5ML syrup Take 5 mLs by mouth 3 (three) times daily as needed for cough. 11/10/18   Synetta Shadow, MD  hydrochlorothiazide (HYDRODIURIL) 25 MG tablet Take 1 tablet (25 mg total) by mouth daily. 10/27/18   Randall Hiss, MD  HYDROcodone-homatropine [facility]) 5-1.5 MG/5ML syrup Take 5 mLs by mouth every 6 (six) hours as needed for cough. 07/25/18   Mardella Layman, MD  loratadine (CLARITIN) 10 MG tablet Take 1 tablet (10 mg total) by mouth daily. 11/25/18   Wieters, Junius Creamer, PA-C    Family History Family History  Problem Relation Age of Onset  . Diabetes Sister   . Hypertension Sister     Social History Social History   Tobacco Use  . Smoking status: Former Smoker    Last attempt to quit:  12/22/2013    Years since quitting: 5.0  . Smokeless tobacco: Never Used  Substance Use Topics  . Alcohol use: No    Alcohol/week: 0.0 standard drinks  . Drug use: No     Allergies   Patient has no known allergies.   Review of Systems Review of Systems  Constitutional: Positive for chills.  Respiratory: Positive for cough.   Musculoskeletal: Positive for myalgias.  All other systems reviewed and are negative.    Physical Exam Triage Vital Signs ED Triage Vitals  Enc Vitals Group     BP 01/17/19 1226 (!) 150/94      Pulse Rate 01/17/19 1226 88     Resp 01/17/19 1226 18     Temp 01/17/19 1226 98.2 F (36.8 C)     Temp Source 01/17/19 1226 Oral     SpO2 01/17/19 1226 98 %     Weight 01/17/19 1222 220 lb (99.8 kg)     Height --      Head Circumference --      Peak Flow --      Pain Score 01/17/19 1222 8     Pain Loc --      Pain Edu? --      Excl. in GC? --    No data found.  Updated Vital Signs BP (!) 150/94   Pulse 88   Temp 98.2 F (36.8 C) (Oral)   Resp 18   Wt 99.8 kg   LMP 12/24/2018   SpO2 98%   BMI 39.60 kg/m   Visual Acuity Right Eye Distance:   Left Eye Distance:   Bilateral Distance:    Right Eye Near:   Left Eye Near:    Bilateral Near:     Physical Exam Vitals signs and nursing note reviewed.  Constitutional:      Appearance: She is well-developed.  HENT:     Head: Normocephalic.     Right Ear: Tympanic membrane normal.     Left Ear: Tympanic membrane normal.     Mouth/Throat:     Mouth: Mucous membranes are moist.  Neck:     Musculoskeletal: Normal range of motion.  Cardiovascular:     Rate and Rhythm: Normal rate.  Pulmonary:     Effort: Pulmonary effort is normal.  Abdominal:     General: There is no distension.  Musculoskeletal: Normal range of motion.  Neurological:     Mental Status: She is alert and oriented to person, place, and time.  Psychiatric:        Mood and Affect: Mood normal.      UC Treatments / Results  Labs (all labs ordered are listed, but only abnormal results are displayed) Labs Reviewed - No data to display  EKG None  Radiology No results found.  Procedures Procedures (including critical care time)  Medications Ordered in UC Medications - No data to display  Initial Impression / Assessment and Plan / UC Course  I have reviewed the triage vital signs and the nursing notes.  Pertinent labs & imaging results that were available during my care of the patient were reviewed by me and considered in my medical  decision making (see chart for details).     Pt given information on covid testing.  Pt advised to return if any problems.   Out of work x 1 week with follow up.  Final Clinical Impressions(s) / UC Diagnoses   Final diagnoses:  Viral illness     Discharge Instructions  Call [phone] to be seen for testing     Person Under Monitoring Name: Monique [name]  Location: [address] [place] [address]   Infection Prevention Recommendations for Individuals Confirmed to have, or Being Evaluated for, 2019 Novel Coronavirus (COVID-19) Infection Who Receive Care at Home  Individuals who are confirmed to have, or are being evaluated for, COVID-19 should follow the prevention steps below until a healthcare provider or local or state health department says they can return to normal activities.  Stay home except to get medical care You should restrict activities outside your home, except for getting medical care. Do not go to work, school, or public areas, and do not use public transportation or taxis.  Call ahead before visiting your doctor Before your medical appointment, call the healthcare provider and tell them that you have, or are being evaluated for, COVID-19 infection. This will help the healthcare provider's office take steps to keep other people from getting infected. Ask your healthcare provider to call the local or state health department.  Monitor your symptoms Seek prompt medical attention if your illness is worsening (e.g., difficulty breathing). Before going to your medical appointment, call the healthcare provider and tell them that you have, or are being evaluated for, COVID-19 infection. Ask your healthcare provider to call the local or state health department.  Wear a facemask You should wear a facemask that covers your nose and mouth when you are in the same room with other people and when you visit a healthcare provider. People who live with or  visit you should also wear a facemask while they are in the same room with you.  Separate yourself from other people in your home As much as possible, you should stay in a different room from other people in your home. Also, you should use a separate bathroom, if available.  Avoid sharing household items You should not share dishes, drinking glasses, cups, eating utensils, towels, bedding, or other items with other people in your home. After using these items, you should wash them thoroughly with soap and water.  Cover your coughs and sneezes Cover your mouth and nose with a tissue when you cough or sneeze, or you can cough or sneeze into your sleeve. Throw used tissues in a lined trash can, and immediately wash your hands with soap and water for at least 20 seconds or use an alcohol-based hand rub.  Wash your [employer] your hands often and thoroughly with soap and water for at least 20 seconds. You can use an alcohol-based hand sanitizer if soap and water are not available and if your hands are not visibly dirty. Avoid touching your eyes, nose, and mouth with unwashed hands.   Prevention Steps for Caregivers and Household Members of Individuals Confirmed to have, or Being Evaluated for, COVID-19 Infection Being Cared for in the Home  If you live with, or provide care at home for, a person confirmed to have, or being evaluated for, COVID-19 infection please follow these guidelines to prevent infection:  Follow healthcare provider's instructions Make sure that you understand and can help the patient follow any healthcare provider instructions for all care.  Provide for the patient's basic needs You should help the patient with basic needs in the home and provide support for getting groceries, prescriptions, and other personal needs.  Monitor the patient's symptoms If they are getting sicker, call his or her medical provider and tell them that the patient has, or is being evaluated  for, COVID-19 infection. This will help the healthcare provider's office take steps to keep other people from getting infected. Ask the healthcare provider to call the local or state health department.  Limit the number of people who have contact with the patient  If possible, have only one caregiver for the patient.  Other household members should stay in another home or place of residence. If this is not possible, they should stay  in another room, or be separated from the patient as much as possible. Use a separate bathroom, if available.  Restrict visitors who do not have an essential need to be in the home.  Keep older adults, very young children, and other sick people away from the patient Keep older adults, very young children, and those who have compromised immune systems or chronic health conditions away from the patient. This includes people with chronic heart, lung, or kidney conditions, diabetes, and cancer.  Ensure good ventilation Make sure that shared spaces in the home have good air flow, such as from an air conditioner or an opened window, weather permitting.  Wash your hands often  Wash your hands often and thoroughly with soap and water for at least 20 seconds. You can use an alcohol based hand sanitizer if soap and water are not available and if your hands are not visibly dirty.  Avoid touching your eyes, nose, and mouth with unwashed hands.  Use disposable paper towels to dry your hands. If not available, use dedicated cloth towels and replace them when they become wet.  Wear a facemask and gloves  Wear a disposable facemask at all times in the room and gloves when you touch or have contact with the patient's blood, body fluids, and/or secretions or excretions, such as sweat, saliva, sputum, nasal mucus, vomit, urine, or feces.  Ensure the mask fits over your nose and mouth tightly, and do not touch it during use.  Throw out disposable facemasks and gloves after  using them. Do not reuse.  Wash your hands immediately after removing your facemask and gloves.  If your personal clothing becomes contaminated, carefully remove clothing and launder. Wash your hands after handling contaminated clothing.  Place all used disposable facemasks, gloves, and other waste in a lined container before disposing them with other household waste.  Remove gloves and wash your hands immediately after handling these items.  Do not share dishes, glasses, or other household items with the patient  Avoid sharing household items. You should not share dishes, drinking glasses, cups, eating utensils, towels, bedding, or other items with a patient who is confirmed to have, or being evaluated for, COVID-19 infection.  After the person uses these items, you should wash them thoroughly with soap and water.  Wash laundry thoroughly  Immediately remove and wash clothes or bedding that have blood, body fluids, and/or secretions or excretions, such as sweat, saliva, sputum, nasal mucus, vomit, urine, or feces, on them.  Wear gloves when handling laundry from the patient.  Read and follow directions on labels of laundry or clothing items and detergent. In general, wash and dry with the warmest temperatures recommended on the label.  Clean all areas the individual has used often  Clean all touchable surfaces, such as counters, tabletops, doorknobs, bathroom fixtures, toilets, phones, keyboards, tablets, and bedside tables, every day. Also, clean any surfaces that may have blood, body fluids, and/or secretions or excretions on them.  Wear gloves when cleaning surfaces the patient has come in contact with.  Use a  diluted bleach solution (e.g., dilute bleach with 1 part bleach and 10 parts water) or a household disinfectant with a label that says EPA-registered for coronaviruses. To make a bleach solution at home, add 1 tablespoon of bleach to 1 quart (4 cups) of water. For a larger  supply, add  cup of bleach to 1 gallon (16 cups) of water.  Read labels of cleaning products and follow recommendations provided on product labels. Labels contain instructions for safe and effective use of the cleaning product including precautions you should take when applying the product, such as wearing gloves or eye protection and making sure you have good ventilation during use of the product.  Remove gloves and wash hands immediately after cleaning.  Monitor yourself for signs and symptoms of illness Caregivers and household members are considered close contacts, should monitor their health, and will be asked to limit movement outside of the home to the extent possible. Follow the monitoring steps for close contacts listed on the symptom monitoring form.   ? If you have additional questions, contact your local health department or call the epidemiologist on call at [phone] (available 24/7). ? This guidance is subject to change. For the most up-to-date guidance from [facility], please refer to their website: [email or web address]    ED Prescriptions    None     Controlled Substance Prescriptions [place] Controlled Substance Registry consulted? Not Applicable   [place], [place] 01/17/19 2094

## 2019-01-26 ENCOUNTER — Other Ambulatory Visit: Payer: Self-pay

## 2019-01-26 ENCOUNTER — Telehealth: Payer: Self-pay | Admitting: *Deleted

## 2019-01-26 ENCOUNTER — Ambulatory Visit: Payer: Self-pay | Admitting: Internal Medicine

## 2019-01-26 DIAGNOSIS — Z79899 Other long term (current) drug therapy: Secondary | ICD-10-CM

## 2019-01-26 DIAGNOSIS — J302 Other seasonal allergic rhinitis: Secondary | ICD-10-CM

## 2019-01-26 DIAGNOSIS — L02211 Cutaneous abscess of abdominal wall: Secondary | ICD-10-CM | POA: Insufficient documentation

## 2019-01-26 DIAGNOSIS — I10 Essential (primary) hypertension: Secondary | ICD-10-CM

## 2019-01-26 NOTE — Patient Instructions (Signed)
It was nice seeing you today. Thank you for choosing [facility] for your Primary Care.   Today we talked about:  1) Nasal congestion, cough:  - Claritin (loratadine) 10mg  daily  - Flonase (fluticasone) nasal spray once daily   - if severe nasal congestion, use phenylephrine nasal spray but do not use more than 3 days in a row  - you can also take robitussin or any other cough suppressant   - if you are not feeling better by Thursday, please give us a call   2) Boil on abdomen:   - I think it will continue getting better with time  - It may drain more on its own  - If you notice increased pain, redness, or swelling, come back to see us because you might need an antibiotic and we  may need to drain the abscess in the clinic   3) High blood pressure: please follow up with your regular doctor to adjust your blood pressure medications.    FOLLOW-UP INSTRUCTIONS When: 1 month with PCP For: HTN What to bring: all medications   Please contact the clinic if you have any problems, or need to be seen sooner.    Skin Abscess  A skin abscess is an infected area of your skin that contains pus and other material. An abscess can happen in any part of your body. Some abscesses break open (rupture) on their own. Most continue to get worse unless they are treated. The infection can spread deeper into the body and into your blood, which can make you feel sick. A skin abscess is caused by germs that enter the skin through a cut or scrape. It can also be caused by blocked oil and sweat glands or infected hair follicles. This condition is usually treated by:  Draining the pus.  Taking antibiotic medicines.  Placing a warm, wet washcloth over the abscess. Follow these instructions at home: Medicines   Take over-the-counter and prescription medicines only as told by your doctor.  If you were prescribed an antibiotic medicine, take it as told by your doctor. Do not stop taking the  antibiotic even if you start to feel better. Abscess care   If you have an abscess that has not drained, place a warm, clean, wet washcloth over the abscess several times a day. Do this as told by your doctor.  Follow instructions from your doctor about how to take care of your abscess. Make sure you: ? Cover the abscess with a bandage (dressing). ? Change your bandage or gauze as told by your doctor. ? Wash your hands with soap and water before you change the bandage or gauze. If you cannot use soap and water, use hand sanitizer.  Check your abscess every day for signs that the infection is getting worse. Check for: ? More redness, swelling, or pain. ? More fluid or blood. ? Warmth. ? More pus or a bad smell. General instructions  To avoid spreading the infection: ? Do not share personal care items, towels, or hot tubs with others. ? Avoid making skin-to-skin contact with other people.  Keep all follow-up visits as told by your doctor. This is important. Contact a doctor if:  You have more redness, swelling, or pain around your abscess.  You have more fluid or blood coming from your abscess.  Your abscess feels warm when you touch it.  You have more pus or a bad smell coming from your abscess.  You have a fever.  Your muscles ache.  You have chills.  You feel sick. Get help right away if:  You have very bad (severe) pain.  You see red streaks on your skin spreading away from the abscess. Summary  A skin abscess is an infected area of your skin that contains pus and other material.  The abscess is caused by germs that enter the skin through a cut or scrape. It can also be caused by blocked oil and sweat glands or infected hair follicles.  Follow your doctor's instructions on caring for your abscess, taking medicines, preventing infections, and keeping follow-up visits. This information is not intended to replace advice given to you by your health care provider.  Make sure you discuss any questions you have with your health care provider. Document Released: 01/30/2008 Document Revised: 09/26/2017 Document Reviewed: 09/26/2017 Elsevier Interactive Patient Education  2019 [employer].

## 2019-01-26 NOTE — Assessment & Plan Note (Signed)
Started two weeks ago, she drained it on her own at home. It appears to be healing now. I do not appreciate any significant fluid collection on palpation and it is non-tender. Considered ultrasound but given benign exam I am hopeful it will continue healing on its own.   - instructed patient to return if increased swelling, redness, warmth, pain, drainage, fevers, or chills  

## 2019-01-26 NOTE — Progress Notes (Signed)
   CC: cough, abdominal abscess   HPI:  [name] is a 48 y.o. female with PMHx as listed below who p/w cough.  She reports a 10 day h/o sinus congestion, nonproductive cough, h/a. She was seen in the ED and tested negative for COVID. She has only tried tylenol and increased water intake and symptoms have persisted. She denies fever or sob. No GI symptoms. She reports h/o seasonal allergies but is not currently taking any allergy medication. Denies sick contacts.   Past Medical History:  Diagnosis Date  . Chest pressure 09/16/2017  . Exertional chest pain 05/13/2017  . Headache 02/06/2017  . HIV (human immunodeficiency virus infection) (HCC)   . Hypertension   . 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 Lower extremity edema 01/12/2015  . Lung abscess (HCC)   . Nausea 02/06/2017  . Overweight 01/12/2015  . Pneumonia   . TB (tuberculosis)   . Tonsillitis   . Viral URI 09/16/2017  . Weight gain 10/27/2018   Physical Exam:  Vitals:   01/26/19 1441  BP: (!) 150/97  Pulse: 79  Temp: 98.3 F (36.8 C)  TempSrc: Oral  SpO2: 98%  Weight: 231 lb 3.2 oz (104.9 kg)  Height: 5\' 2"  (1.575 m)   Gen: well appearing, NAD HENT: nasal turbinates erythematous and swollen, oropharynx without exudate, normal TMs, no cervical LAD, nasal sinuses are TTP Cardiac: RRR, no mrg Pulm: CTAB, good air movement throughout Abd: 3x2cm area of hyperpigmented, scar like tissue with healing central punctate lesion. No active drainage, nontender, unable to appreciate underlying fluid collection   Assessment & Plan:   See Encounters Tab for problem based charting.  Patient discussed with Dr. Rogelia Boga

## 2019-01-27 NOTE — Progress Notes (Signed)
Internal Medicine Clinic Attending  Case discussed with Dr. Vogel  at the time of the visit.  We reviewed the resident's history and exam and pertinent patient test results.  I agree with the assessment, diagnosis, and plan of care documented in the resident's note.  

## 2019-02-15 ENCOUNTER — Encounter: Payer: Self-pay | Admitting: *Deleted

## 2019-04-06 ENCOUNTER — Other Ambulatory Visit: Payer: Self-pay

## 2019-04-09 ENCOUNTER — Encounter: Payer: Self-pay | Admitting: Infectious Disease

## 2019-04-09 ENCOUNTER — Ambulatory Visit: Payer: Self-pay

## 2019-04-09 ENCOUNTER — Other Ambulatory Visit: Payer: Self-pay

## 2019-04-09 DIAGNOSIS — Z113 Encounter for screening for infections with a predominantly sexual mode of transmission: Secondary | ICD-10-CM

## 2019-04-09 DIAGNOSIS — Z79899 Other long term (current) drug therapy: Secondary | ICD-10-CM

## 2019-04-09 DIAGNOSIS — I10 Essential (primary) hypertension: Secondary | ICD-10-CM

## 2019-04-09 DIAGNOSIS — R635 Abnormal weight gain: Secondary | ICD-10-CM

## 2019-04-09 DIAGNOSIS — B20 Human immunodeficiency virus [HIV] disease: Secondary | ICD-10-CM

## 2019-04-10 LAB — T-HELPER CELL (CD4) - (RCID CLINIC ONLY)
CD4 % Helper T Cell: 40 % (ref 33–65)
CD4 T Cell Abs: 935 /uL (ref 400–1790)

## 2019-04-11 LAB — COMPLETE METABOLIC PANEL WITH GFR
AG Ratio: 1.2 (calc) (ref 1.0–2.5)
ALT: 24 U/L (ref 6–29)
AST: 21 U/L (ref 10–35)
Albumin: 3.8 g/dL (ref 3.6–5.1)
Alkaline phosphatase (APISO): 68 U/L (ref 31–125)
BUN: 13 mg/dL (ref 7–25)
CO2: 22 mmol/L (ref 20–32)
Calcium: 9.2 mg/dL (ref 8.6–10.2)
Chloride: 106 mmol/L (ref 98–110)
Creat: 0.81 mg/dL (ref 0.50–1.10)
GFR, Est African American: 100 mL/min/{1.73_m2} (ref >=60)
GFR, Est Non African American: 86 mL/min/{1.73_m2} (ref >=60)
Globulin: 3.1 g/dL (calc) (ref 1.9–3.7)
Glucose, Bld: 118 mg/dL — ABNORMAL HIGH (ref 65–99)
Potassium: 3.7 mmol/L (ref 3.5–5.3)
Sodium: 138 mmol/L (ref 135–146)
Total Bilirubin: 0.2 mg/dL (ref 0.2–1.2)
Total Protein: 6.9 g/dL (ref 6.1–8.1)

## 2019-04-11 LAB — CBC WITH DIFFERENTIAL/PLATELET
Absolute Monocytes: 772 cells/uL (ref 200–950)
Basophils Absolute: 42 cells/uL (ref 0–200)
Basophils Relative: 0.5 %
Eosinophils Absolute: 282 cells/uL (ref 15–500)
Eosinophils Relative: 3.4 %
HCT: 37.5 % (ref 35.0–45.0)
Hemoglobin: 12.4 g/dL (ref 11.7–15.5)
Lymphs Abs: 2407 cells/uL (ref 850–3900)
MCH: 28.1 pg (ref 27.0–33.0)
MCHC: 33.1 g/dL (ref 32.0–36.0)
MCV: 85 fL (ref 80.0–100.0)
MPV: 11.1 fL (ref 7.5–12.5)
Monocytes Relative: 9.3 %
Neutro Abs: 4797 cells/uL (ref 1500–7800)
Neutrophils Relative %: 57.8 %
Platelets: 316 10*3/uL (ref 140–400)
RBC: 4.41 10*6/uL (ref 3.80–5.10)
RDW: 15 % (ref 11.0–15.0)
Total Lymphocyte: 29 %
WBC: 8.3 10*3/uL (ref 3.8–10.8)

## 2019-04-11 LAB — LIPID PANEL
Cholesterol: 156 mg/dL (ref <200)
HDL: 37 mg/dL — ABNORMAL LOW (ref >=50)
LDL Cholesterol (Calc): 89 mg/dL (calc)
Non-HDL Cholesterol (Calc): 119 mg/dL (calc) (ref <130)
Total CHOL/HDL Ratio: 4.2 (calc) (ref <5.0)
Triglycerides: 200 mg/dL — ABNORMAL HIGH (ref <150)

## 2019-04-11 LAB — RPR: RPR Ser Ql: NONREACTIVE

## 2019-04-11 LAB — HIV-1 RNA QUANT-NO REFLEX-BLD
HIV 1 RNA Quant: <20 copies/mL
HIV-1 RNA Quant, Log: <1.3 Log copies/mL

## 2019-04-20 ENCOUNTER — Other Ambulatory Visit: Payer: Self-pay

## 2019-04-20 ENCOUNTER — Ambulatory Visit (INDEPENDENT_AMBULATORY_CARE_PROVIDER_SITE_OTHER): Payer: Self-pay | Admitting: Infectious Disease

## 2019-04-20 ENCOUNTER — Encounter: Payer: Self-pay | Admitting: Infectious Disease

## 2019-04-20 DIAGNOSIS — E669 Obesity, unspecified: Secondary | ICD-10-CM

## 2019-04-20 DIAGNOSIS — I10 Essential (primary) hypertension: Secondary | ICD-10-CM

## 2019-04-20 DIAGNOSIS — B20 Human immunodeficiency virus [HIV] disease: Secondary | ICD-10-CM

## 2019-04-20 MED ORDER — AMLODIPINE BESYLATE 10 MG PO TABS: 10.0000 mg | ORAL_TABLET | Freq: Every day | ORAL | 11 refills | Status: DC

## 2019-04-20 MED ORDER — HYDROCHLOROTHIAZIDE 25 MG PO TABS: 25.0000 mg | ORAL_TABLET | Freq: Every day | ORAL | 11 refills | Status: DC

## 2019-04-20 MED ORDER — BIKTARVY 50-200-25 MG PO TABS: 1.0000 | ORAL_TABLET | Freq: Every day | ORAL | 11 refills | Status: DC

## 2019-04-20 NOTE — Progress Notes (Signed)
Virtual Visit via Telephone Note  I connected with Monique [name] on 04/20/19 at  8:45 AM EDT by telephone and verified that I am speaking with the correct person using two identifiers.  Location: Patient: Home Provider: My home   I discussed the limitations, risks, security and privacy concerns of performing an evaluation and management service by telephone and the availability of in person appointments. I also discussed with the patient that there may be a patient responsible charge related to this service. The patient expressed understanding and agreed to proceed.   History of Present Illness:  48 year old African-American woman living with HIV that is well controlled on Biktarvy.  She does have comorbidities of hypertension and obesity.  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  She does say that she and her coworkers today a fair distance from 1 another.  She has renewed her HIV medication assistance program and she has had blood work done.  She continues to maintain excellent virological suppression and is highly adherent to her antiretrovirals.  She states she is also taking her blood pressure medicines   Observations/Objective:  HIV that is well controlled.  Hypertension medically controlled on current medication regimen.    Assessment and Plan:  HIV disease: Well controlled she can come back in January for labs and renewal of HIV medication assistance form program and for visit with me  Hypertension I have sent in her antihypertensive medications.  Need for flu vaccination I asked that she schedule appointment to get a flu shot in September  Follow Up Instructions:    I discussed the assessment and treatment plan with the patient. The patient was provided an opportunity to ask questions and all were answered. The patient agreed with the plan and demonstrated an understanding of the instructions.   The patient was  advised to call back or seek an in-person evaluation if the symptoms worsen or if the condition fails to improve as anticipated.  I provided 22 minutes of non-face-to-face time during this encounter.   Alcide Evener, MD

## 2019-05-07 ENCOUNTER — Ambulatory Visit (INDEPENDENT_AMBULATORY_CARE_PROVIDER_SITE_OTHER): Payer: Self-pay

## 2019-05-07 ENCOUNTER — Other Ambulatory Visit: Payer: Self-pay

## 2019-05-07 DIAGNOSIS — Z23 Encounter for immunization: Secondary | ICD-10-CM

## 2019-05-12 ENCOUNTER — Telehealth: Payer: Self-pay | Admitting: Internal Medicine

## 2019-05-12 NOTE — Telephone Encounter (Signed)
Please call patient back in Reference to picking up information about patient being out of work until she took the  COVID-19 test.

## 2019-05-12 NOTE — Telephone Encounter (Signed)
called pt, she will come in tomorrow and pick up 6/1 letter

## 2019-06-06 ENCOUNTER — Other Ambulatory Visit: Payer: Self-pay

## 2019-06-06 ENCOUNTER — Encounter (HOSPITAL_COMMUNITY): Payer: Self-pay

## 2019-06-06 ENCOUNTER — Ambulatory Visit (HOSPITAL_COMMUNITY)
Admission: EM | Admit: 2019-06-06 | Discharge: 2019-06-06 | Disposition: A | Discharge status: Home / Self Care | Payer: Self-pay | Attending: Family Medicine | Admitting: Family Medicine

## 2019-06-06 DIAGNOSIS — J029 Acute pharyngitis, unspecified: Secondary | ICD-10-CM | POA: Insufficient documentation

## 2019-06-06 DIAGNOSIS — R0981 Nasal congestion: Secondary | ICD-10-CM | POA: Insufficient documentation

## 2019-06-06 LAB — POCT RAPID STREP A: Streptococcus, Group A Screen (Direct): NEGATIVE

## 2019-06-06 MED ORDER — FLUTICASONE PROPIONATE 50 MCG/ACT NA SUSP: 2.0000 | Freq: Every day | NASAL | 0 refills | Status: DC

## 2019-06-06 NOTE — ED Triage Notes (Signed)
Patient presents to Urgent Care with complaints of sore throat, worse on the right side since 2-3 days. Patient reports she has not been febrile but has been taking tylenol for her pain, pt thinks her tonsils are inflamed and she needs abx.

## 2019-06-06 NOTE — ED Provider Notes (Signed)
[facility]    CSN: [id number] Arrival date & time: 06/06/19  1019      History   Chief Complaint Chief Complaint  Patient presents with  . Monique [name]    HPI KEYERRA [name] is a 48 y.o. female.    Monique [name] This is a new problem. The current episode started 2 days ago. The problem occurs constantly. The problem has not changed since onset.Pertinent negatives include no chest pain, no abdominal pain, no headaches and no shortness of breath. The symptoms are aggravated by swallowing. Nothing relieves the symptoms. She has tried acetaminophen for the symptoms. The treatment provided mild relief.    Past Medical History:  Diagnosis Date  . Chest pressure 09/16/2017  . Exertional chest pain 05/13/2017  . Headache 02/06/2017  . HIV (human immunodeficiency virus infection) (HCC)   . Hypertension   . 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 Lower extremity edema 01/12/2015  . Lung abscess (HCC)   . Nausea 02/06/2017  . Overweight 01/12/2015  . Pneumonia   . TB (tuberculosis)   . Tonsillitis   . Viral URI 09/16/2017  . Weight gain 10/27/2018    Patient Active Problem List   Diagnosis Date Noted  . Abscess of skin of abdomen 01/26/2019  . Seasonal allergies 01/26/2019  . Weight gain 10/27/2018  . Myalgia 07/04/2018  . Healthcare maintenance 05/19/2018  . Exertional chest pain 05/13/2017  . Obese 08/10/2015  . Essential hypertension 01/12/2015  . 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 Human immunodeficiency virus (HIV) disease (HCC) 04/10/2013    Past Surgical History:  Procedure Laterality Date  . CESAREAN SECTION    . VIDEO BRONCHOSCOPY Bilateral 04/13/2013   Procedure: VIDEO BRONCHOSCOPY WITHOUT FLUORO;  Surgeon: Kalman Shan, MD;  Location: [facility] ENDOSCOPY;  Service: Cardiopulmonary;  Laterality: Bilateral;    OB History   No obstetric history on file.      Home  Medications    Prior to Admission medications   Medication Sig Start Date End Date Taking? Authorizing Provider  acetaminophen (TYLENOL) 500 MG tablet Take 2 tablets (1,000 mg total) by mouth every 8 (eight) hours as needed for mild pain or fever. 07/04/18   Dorrell, Cathleen Corti, MD  amLODipine (NORVASC) 10 MG tablet Take 1 tablet (10 mg total) by mouth daily. 04/20/19   Randall Hiss, MD  bictegravir-emtricitabine-tenofovir AF (BIKTARVY) 50-200-25 MG TABS tablet Take 1 tablet by mouth daily. 04/20/19   Randall Hiss, MD  fluticasone [facility]) 50 MCG/ACT nasal spray Place 2 sprays into both nostrils daily. 06/06/19 06/05/20  Shakeitha Umbaugh, Gloris Manchester A, NP  guaiFENesin-codeine (ROBITUSSIN AC) 100-10 MG/5ML syrup Take 5 mLs by mouth 3 (three) times daily as needed for cough. 11/10/18   Synetta Shadow, MD  hydrochlorothiazide (HYDRODIURIL) 25 MG tablet Take 1 tablet (25 mg total) by mouth daily. 04/20/19   Randall Hiss, MD  HYDROcodone-homatropine [facility]) 5-1.5 MG/5ML syrup Take 5 mLs by mouth every 6 (six) hours as needed for cough. 07/25/18   Mardella Layman, MD  loratadine (CLARITIN) 10 MG tablet Take 1 tablet (10 mg total) by mouth daily. 11/25/18   Wieters, Junius Creamer, PA-C    Family History Family History  Problem Relation Age of Onset  . Diabetes Sister   . Hypertension Sister   . Hypertension Mother   . Hypertension Father     Social History Social History   Tobacco Use  .  Smoking status: Former Smoker    Quit date: 12/22/2013    Years since quitting: 5.4  . Smokeless tobacco: Never Used  Substance Use Topics  . Alcohol use: No    Alcohol/week: 0.0 standard drinks  . Drug use: No     Allergies   Patient has no known allergies.   Review of Systems Review of Systems  Respiratory: Negative for shortness of breath.   Cardiovascular: Negative for chest pain.  Gastrointestinal: Negative for abdominal pain.  Neurological: Negative for headaches.     Physical Exam  Triage Vital Signs ED Triage Vitals  Enc Vitals Group     BP 06/06/19 1053 (!) 143/92     Pulse Rate 06/06/19 1053 95     Resp 06/06/19 1053 16     Temp 06/06/19 1053 99.7 F (37.6 C)     Temp Source 06/06/19 1053 Oral     SpO2 06/06/19 1053 99 %     Weight --      Height --      Head Circumference --      Peak Flow --      Pain Score 06/06/19 1051 8     Pain Loc --      Pain Edu? --      Excl. in [place]? --    No data found.  Updated Vital Signs BP (!) 143/92 (BP Location: Right Arm)   Pulse 95   Temp 99.7 F (37.6 C) (Oral)   Resp 16   SpO2 99%   Visual Acuity Right Eye Distance:   Left Eye Distance:   Bilateral Distance:    Right Eye Near:   Left Eye Near:    Bilateral Near:     Physical Exam Vitals signs and nursing note reviewed.  Constitutional:      General: She is not in acute distress.    Appearance: She is well-developed. She is not ill-appearing, toxic-appearing or diaphoretic.  HENT:     Head: Normocephalic and atraumatic.     Right Ear: Tympanic membrane and ear canal normal.     Left Ear: Tympanic membrane and ear canal normal.     Mouth/[name]:     Mouth: Mucous membranes are moist.     Pharynx: No posterior oropharyngeal erythema.     Tonsils: No tonsillar exudate or tonsillar abscesses. 1+ on the right. 1+ on the left.     Comments: PND Eyes:     Conjunctiva/sclera: Conjunctivae normal.  Neck:     Musculoskeletal: Normal range of motion.  Pulmonary:     Effort: Pulmonary effort is normal.  Skin:    General: Skin is warm and dry.  Neurological:     Mental Status: She is alert.  Psychiatric:        Mood and Affect: Mood normal.      UC Treatments / Results  Labs (all labs ordered are listed, but only abnormal results are displayed) Labs Reviewed  CULTURE, GROUP A STREP [facility])  POCT RAPID STREP A    EKG   Radiology No results found.  Procedures Procedures (including critical care time)  Medications Ordered in UC  Medications - No data to display  Initial Impression / Assessment and Plan / UC Course  I have reviewed the triage vital signs and the nursing notes.  Pertinent labs & imaging results that were available during my care of the patient were reviewed by me and considered in my medical decision making (see chart for details).  Rapid strep test negative Sending for culture.  Most likely allergy or viral related.  OTC meds for symptoms. Follow up as needed for continued or worsening symptoms  Final Clinical Impressions(s) / UC Diagnoses   Final diagnoses:  Monique [name]     Discharge Instructions     Using Flonase and Zyrtec daily. You can continue the Tylenol for pain I believe this is most likely allergy related  we will call you if your culture is positive    ED Prescriptions    Medication Sig Dispense Auth. Provider   fluticasone (FLONASE) 50 MCG/ACT nasal spray Place 2 sprays into both nostrils daily. 1 g Dahlia ByesBast, Keriana Sarsfield A, NP     PDMP not reviewed this encounter.   Dahlia ByesBast, Milfred Krammes A, NP 06/06/19 1254

## 2019-06-06 NOTE — Discharge Instructions (Addendum)
Using Flonase and Zyrtec daily. You can continue the Tylenol for pain I believe this is most likely allergy related  we will call you if your culture is positive

## 2019-06-09 LAB — CULTURE, GROUP A STREP (THRC)

## 2019-08-27 NOTE — Addendum Note (Signed)
Addended by: Mahathi Pokorney D on: 08/27/2019 08:56 AM   Modules accepted: Orders  

## 2019-08-27 NOTE — Addendum Note (Signed)
Addended by: Kalman Nylen D on: 08/27/2019 08:56 AM   Modules accepted: Orders  

## 2019-08-27 NOTE — Addendum Note (Signed)
Addended by: Dolan Amen D on: 08/27/2019 08:56 AM   Modules accepted: Orders

## 2019-09-03 ENCOUNTER — Other Ambulatory Visit: Payer: Self-pay

## 2019-09-03 DIAGNOSIS — B20 Human immunodeficiency virus [HIV] disease: Secondary | ICD-10-CM

## 2019-09-04 LAB — T-HELPER CELL (CD4) - (RCID CLINIC ONLY)
CD4 % Helper T Cell: 35 % (ref 33–65)
CD4 T Cell Abs: 471 /uL (ref 400–1790)

## 2019-09-09 ENCOUNTER — Ambulatory Visit (INDEPENDENT_AMBULATORY_CARE_PROVIDER_SITE_OTHER): Payer: Self-pay | Admitting: Internal Medicine

## 2019-09-09 ENCOUNTER — Other Ambulatory Visit: Payer: Self-pay

## 2019-09-09 ENCOUNTER — Telehealth: Payer: Self-pay | Admitting: *Deleted

## 2019-09-09 DIAGNOSIS — J019 Acute sinusitis, unspecified: Secondary | ICD-10-CM

## 2019-09-09 DIAGNOSIS — R0981 Nasal congestion: Secondary | ICD-10-CM

## 2019-09-09 MED ORDER — BENZONATATE 100 MG PO CAPS: 100.0000 mg | ORAL_CAPSULE | Freq: Four times a day (QID) | ORAL | 1 refills | Status: AC | PRN

## 2019-09-09 MED ORDER — FLUTICASONE PROPIONATE 50 MCG/ACT NA SUSP: 2.0000 | Freq: Every day | NASAL | 0 refills | Status: DC

## 2019-09-09 NOTE — Patient Instructions (Signed)
Thank you, [name] for allowing [place] to provide your care today. Today we discussed Acute Sinusitis.    I have ordered none labs for you. I will call if any are abnormal.    I have place a referrals.   I have ordered the following tests: none   I have ordered the following medication/changed the following medications:   1. Tessalon Perles  100 mg 2. Flonase - take 1-2 puffs twice daily (steroid spray for congestion) 3.. Mucinex or sudafed as needed for congestion 4. Netty Pot/Nasal Irrigation - use sterile water to irrigate your nose.  Please follow-up in  As needed.    Should you have any questions or concerns please call the internal medicine clinic at [phone].    Dellia Cloud, D.O. [facility]

## 2019-09-09 NOTE — Progress Notes (Signed)
I connected with  Monique [name] on 09/09/19 by a video enabled telemedicine application and verified that I am speaking with the correct person using two identifiers.   I discussed the limitations of evaluation and management by telemedicine. The patient expressed understanding and agreed to proceed.    CC: Sinusitis  This is a telephone encounter between Monique [name] and Monique [name] on 09/09/2019 for sinusitis. The visit was conducted with the patient located at home and Monique [name] at [facility]. The patient's identity was confirmed using their DOB and current address. The patient has consented to being evaluated through a telephone encounter and understands the associated risks (an examination cannot be done and the patient may need to come in for an appointment) / benefits (allows the patient to remain at home, decreasing exposure to coronavirus). I personally spent 10 minutes on medical discussion.   HPI:  [name] is a 49 y.o. with PMH as below.   Sinus infection, head pain, tried sudfed, corcedin, with cough, help it go. deeling with this for about a week. Is having nonproductive cough, no fever,denies shortness of breath,   Please see A&P for assessment of the patient's acute and chronic medical conditions.   Past Medical History:  Diagnosis Date  . Chest pressure 09/16/2017  . Exertional chest pain 05/13/2017  . Headache 02/06/2017  . HIV (human immunodeficiency virus infection) (HCC)   . Hypertension   . 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 Lower extremity edema 01/12/2015  . Lung abscess (HCC)   . Nausea 02/06/2017  . Overweight 01/12/2015  . Pneumonia   . TB (tuberculosis)   . Tonsillitis   . Viral URI 09/16/2017  . Weight gain 10/27/2018   Review of Systems:  Review of Systems  Constitutional: Negative for chills, fever and malaise/fatigue.  HENT: Positive for congestion and sinus pain. Negative for sore throat.     Respiratory: Positive for cough. Negative for sputum production, shortness of breath and stridor.   Cardiovascular: Negative for chest pain and leg swelling.  Gastrointestinal: Negative for abdominal pain.  Genitourinary: Negative for dysuria.  Musculoskeletal: Negative for joint pain and myalgias.  Neurological: Positive for headaches. Negative for sensory change, focal weakness and weakness.      Assessment & Plan:   See Encounters Tab for problem based charting.  Patient discussed with Dr. Oswaldo Done

## 2019-09-09 NOTE — Telephone Encounter (Signed)
Call transferred from [place] office. Patient c/o "sinus problems," nasal congestion without drainage. Pain behind both eyes. Denies fever, cough. States she feels well except for congestion. Only minimal relief with OTC sudafed and coricidin. Placed on today's [facility] schedule for telehealth visit. Kinnie Feil, BSN, RN-BC

## 2019-09-10 ENCOUNTER — Ambulatory Visit: Payer: Self-pay

## 2019-09-10 DIAGNOSIS — J019 Acute sinusitis, unspecified: Secondary | ICD-10-CM | POA: Insufficient documentation

## 2019-09-10 DIAGNOSIS — J329 Chronic sinusitis, unspecified: Secondary | ICD-10-CM | POA: Insufficient documentation

## 2019-09-10 NOTE — Assessment & Plan Note (Signed)
Patient presents for telehealth visit today stating that she has a roughly 1 week history of nasal congestion, cough, sinus pain and pressure.  She states that she is previously had similar symptoms back in March that resolved without complications.  Patient states that she denies any fevers, chills, shortness of breath, myalgias/arthralgias, significant malaise.  She states that she has been using Sudafed with minimal relief.  I counseled her on the importance of using nasal irrigation with sterile water to relieve sinus congestion.  I told her I would prescribe her Flonase for nasal inflammation.  I say that she can use Mucinex or Sudafed as needed for her symptoms.  She states that she previously used [employer] in the past for cough which helped her.  I agreed that I would prescribe cough medicine at this time.  Plan: -Prescribed Tessalon Perles for cough -Prescribed Flonase 1 to 2 puffs twice daily -Counseled her on the importance of nasal irrigation for her symptoms -Told her to follow-up in a week if her symptoms do not improve or worsen.

## 2019-09-11 LAB — CBC WITH DIFFERENTIAL/PLATELET
Absolute Monocytes: 943 cells/uL (ref 200–950)
Basophils Absolute: 39 cells/uL (ref 0–200)
Basophils Relative: 0.6 %
Eosinophils Absolute: 293 cells/uL (ref 15–500)
Eosinophils Relative: 4.5 %
HCT: 36.5 % (ref 35.0–45.0)
Hemoglobin: 11.8 g/dL (ref 11.7–15.5)
Lymphs Abs: 1300 cells/uL (ref 850–3900)
MCH: 26.8 pg — ABNORMAL LOW (ref 27.0–33.0)
MCHC: 32.3 g/dL (ref 32.0–36.0)
MCV: 83 fL (ref 80.0–100.0)
MPV: 10.8 fL (ref 7.5–12.5)
Monocytes Relative: 14.5 %
Neutro Abs: 3926 cells/uL (ref 1500–7800)
Neutrophils Relative %: 60.4 %
Platelets: 301 10*3/uL (ref 140–400)
RBC: 4.4 10*6/uL (ref 3.80–5.10)
RDW: 14.8 % (ref 11.0–15.0)
Total Lymphocyte: 20 %
WBC: 6.5 10*3/uL (ref 3.8–10.8)

## 2019-09-11 LAB — COMPLETE METABOLIC PANEL WITH GFR
AG Ratio: 1.2 (calc) (ref 1.0–2.5)
ALT: 31 U/L — ABNORMAL HIGH (ref 6–29)
AST: 25 U/L (ref 10–35)
Albumin: 3.8 g/dL (ref 3.6–5.1)
Alkaline phosphatase (APISO): 68 U/L (ref 31–125)
BUN: 14 mg/dL (ref 7–25)
CO2: 28 mmol/L (ref 20–32)
Calcium: 9.4 mg/dL (ref 8.6–10.2)
Chloride: 103 mmol/L (ref 98–110)
Creat: 0.86 mg/dL (ref 0.50–1.10)
GFR, Est African American: 92 mL/min/{1.73_m2} (ref >=60)
GFR, Est Non African American: 79 mL/min/{1.73_m2} (ref >=60)
Globulin: 3.1 g/dL (calc) (ref 1.9–3.7)
Glucose, Bld: 89 mg/dL (ref 65–99)
Potassium: 4.3 mmol/L (ref 3.5–5.3)
Sodium: 137 mmol/L (ref 135–146)
Total Bilirubin: 0.3 mg/dL (ref 0.2–1.2)
Total Protein: 6.9 g/dL (ref 6.1–8.1)

## 2019-09-11 LAB — RPR: RPR Ser Ql: NONREACTIVE

## 2019-09-11 LAB — HIV-1 RNA QUANT-NO REFLEX-BLD
HIV 1 RNA Quant: <20 copies/mL
HIV-1 RNA Quant, Log: <1.3 Log copies/mL

## 2019-09-11 NOTE — Progress Notes (Signed)
Internal Medicine Clinic Attending  Case discussed with Dr. Marchia Bond  at the time of the visit.  We reviewed the resident's history and pertinent patient test results.  I agree with the assessment, diagnosis, and plan of care documented in the resident's note.

## 2019-09-11 NOTE — Addendum Note (Signed)
Addended by: Erlinda Hong T on: 09/11/2019 08:14 AM   Modules accepted: Level of Service

## 2019-09-15 ENCOUNTER — Ambulatory Visit: Payer: Self-pay | Admitting: Infectious Disease

## 2019-09-20 ENCOUNTER — Ambulatory Visit (HOSPITAL_COMMUNITY)
Admission: EM | Admit: 2019-09-20 | Discharge: 2019-09-20 | Disposition: A | Discharge status: Home / Self Care | Payer: Self-pay | Attending: Internal Medicine | Admitting: Internal Medicine

## 2019-09-20 ENCOUNTER — Other Ambulatory Visit: Payer: Self-pay

## 2019-09-20 ENCOUNTER — Encounter (HOSPITAL_COMMUNITY): Payer: Self-pay

## 2019-09-20 DIAGNOSIS — R0602 Shortness of breath: Secondary | ICD-10-CM | POA: Insufficient documentation

## 2019-09-20 DIAGNOSIS — I10 Essential (primary) hypertension: Secondary | ICD-10-CM | POA: Insufficient documentation

## 2019-09-20 DIAGNOSIS — Z7952 Long term (current) use of systemic steroids: Secondary | ICD-10-CM | POA: Insufficient documentation

## 2019-09-20 DIAGNOSIS — E669 Obesity, unspecified: Secondary | ICD-10-CM | POA: Insufficient documentation

## 2019-09-20 DIAGNOSIS — R0981 Nasal congestion: Secondary | ICD-10-CM | POA: Insufficient documentation

## 2019-09-20 DIAGNOSIS — Z8249 Family history of ischemic heart disease and other diseases of the circulatory system: Secondary | ICD-10-CM | POA: Insufficient documentation

## 2019-09-20 DIAGNOSIS — Z20822 Contact with and (suspected) exposure to covid-19: Secondary | ICD-10-CM | POA: Insufficient documentation

## 2019-09-20 DIAGNOSIS — J019 Acute sinusitis, unspecified: Secondary | ICD-10-CM

## 2019-09-20 DIAGNOSIS — Z79899 Other long term (current) drug therapy: Secondary | ICD-10-CM | POA: Insufficient documentation

## 2019-09-20 DIAGNOSIS — Z833 Family history of diabetes mellitus: Secondary | ICD-10-CM | POA: Insufficient documentation

## 2019-09-20 DIAGNOSIS — B20 Human immunodeficiency virus [HIV] disease: Secondary | ICD-10-CM | POA: Insufficient documentation

## 2019-09-20 DIAGNOSIS — R300 Dysuria: Secondary | ICD-10-CM

## 2019-09-20 DIAGNOSIS — Z87891 Personal history of nicotine dependence: Secondary | ICD-10-CM | POA: Insufficient documentation

## 2019-09-20 DIAGNOSIS — Z792 Long term (current) use of antibiotics: Secondary | ICD-10-CM | POA: Insufficient documentation

## 2019-09-20 LAB — POCT URINALYSIS DIP (DEVICE)
Bilirubin Urine: NEGATIVE
Glucose, UA: NEGATIVE mg/dL
Ketones, ur: NEGATIVE mg/dL
Nitrite: NEGATIVE
Protein, ur: NEGATIVE mg/dL
Specific Gravity, Urine: 1.025 (ref 1.005–1.030)
Urobilinogen, UA: 0.2 mg/dL (ref 0.0–1.0)
pH: 5.5 (ref 5.0–8.0)

## 2019-09-20 MED ORDER — PREDNISONE 20 MG PO TABS: 40.0000 mg | ORAL_TABLET | Freq: Every day | ORAL | 0 refills | Status: AC

## 2019-09-20 MED ORDER — ALBUTEROL SULFATE HFA 108 (90 BASE) MCG/ACT IN AERS: 1.0000 | INHALATION_SPRAY | Freq: Four times a day (QID) | RESPIRATORY_TRACT | 0 refills | Status: DC | PRN

## 2019-09-20 MED ORDER — AMOXICILLIN-POT CLAVULANATE 875-125 MG PO TABS: 1.0000 | ORAL_TABLET | Freq: Two times a day (BID) | ORAL | 0 refills | Status: AC

## 2019-09-20 NOTE — Discharge Instructions (Addendum)
Begin Augmentin twice daily for 1 week Begin prednisone 40 mg daily for the next 5 days, please take with food Continue Flonase, may pair with over-the-counter Mucinex, Zyrtec Albuterol inhaler as needed for shortness of breath and wheezing, 1 to 2 puffs every 4-6 hours  We will send the urine for culture to check for urinary tract infection, Augmentin should cover this as well, we will call you if we need to switch antibiotics.  Please follow-up if developing any worsening symptoms, fevers, shortness of breath, difficulty breathing or chest pain

## 2019-09-20 NOTE — ED Provider Notes (Signed)
[facility]    CSN: [id number] Arrival date & time: 09/20/19  1640      History   Chief Complaint Chief Complaint  Patient presents with  . Shortness of Breath  . Nasal Congestion    HPI Monique [name] is a 49 y.o. female history of HIV, hypertension, presenting today for evaluation of sinus congestion and pressure.  Patient states that over the past month she has had a lot of sinus pressure and congestion that has come and gone.  Over the past week it has worsened again.  She notes some difficulty breathing out of her nose with exertion.  She denies any chest pain.  Reports occasional wheezing.  Denies prior history of asthma, but no recent issues.  Denies tobacco use.  Denies any Covid exposure.  No significant cough.  Also reports some dysuria for the past couple of days with frequency.  HPI  Past Medical History:  Diagnosis Date  . Chest pressure 09/16/2017  . Exertional chest pain 05/13/2017  . Headache 02/06/2017  . HIV (human immunodeficiency virus infection) ([place])   . Hypertension   . 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 Lower extremity edema 01/12/2015  . Lung abscess ([facility])   . Nausea 02/06/2017  . Overweight 01/12/2015  . Pneumonia   . TB (tuberculosis)   . Tonsillitis   . Viral URI 09/16/2017  . Weight gain 10/27/2018    Patient Active Problem List   Diagnosis Date Noted  . Acute sinusitis 09/10/2019  . Abscess of skin of abdomen 01/26/2019  . Seasonal allergies 01/26/2019  . Weight gain 10/27/2018  . Myalgia 07/04/2018  . Healthcare maintenance 05/19/2018  . Exertional chest pain 05/13/2017  . Obese 08/10/2015  . Essential hypertension 01/12/2015  . 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 Human immunodeficiency virus (HIV) disease ([place]) 04/10/2013    Past Surgical History:  Procedure Laterality Date  . CESAREAN SECTION    . VIDEO BRONCHOSCOPY Bilateral  04/13/2013   Procedure: VIDEO BRONCHOSCOPY WITHOUT FLUORO;  Surgeon: Brand Males, MD;  Location: [facility];  Service: Cardiopulmonary;  Laterality: Bilateral;    OB History   No obstetric history on file.      Home Medications    Prior to Admission medications   Medication Sig Start Date End Date Taking? Authorizing Provider  amLODipine (NORVASC) 10 MG tablet Take 1 tablet (10 mg total) by mouth daily. 04/20/19  Yes Tommy Medal, Lavell Islam, MD  hydrochlorothiazide (HYDRODIURIL) 25 MG tablet Take 1 tablet (25 mg total) by mouth daily. 04/20/19  Yes Tommy Medal, Lavell Islam, MD  loratadine (CLARITIN) 10 MG tablet Take 1 tablet (10 mg total) by mouth daily. 11/25/18  Yes Tehilla Coffel C, PA-C  acetaminophen (TYLENOL) 500 MG tablet Take 2 tablets (1,000 mg total) by mouth every 8 (eight) hours as needed for mild pain or fever. 07/04/18   Dorrell, Andree Elk, MD  albuterol (VENTOLIN HFA) 108 (90 Base) MCG/ACT inhaler Inhale 1-2 puffs into the lungs every 6 (six) hours as needed for wheezing or shortness of breath. 09/20/19   Timmie Dugue C, PA-C  amoxicillin-clavulanate (AUGMENTIN) 875-125 MG tablet Take 1 tablet by mouth every 12 (twelve) hours for 7 days. 09/20/19 09/27/19  Batina Dougan C, PA-C  benzonatate (TESSALON PERLES) 100 MG capsule Take 1 capsule (100 mg total) by mouth every 6 (six) hours as needed for up to 14 days for cough. 09/09/19 09/23/19  Marianna Payment,  Sharlet Salina, MD  bictegravir-emtricitabine-tenofovir AF (BIKTARVY) 50-200-25 MG TABS tablet Take 1 tablet by mouth daily. 04/20/19   Randall Hiss, MD  fluticasone [facility]) 50 MCG/ACT nasal spray Place 2 sprays into both nostrils daily. 09/09/19 09/08/20  Dellia Cloud, MD  predniSONE (DELTASONE) 20 MG tablet Take 2 tablets (40 mg total) by mouth daily with breakfast for 5 days. 09/20/19 09/25/19  Leyanna Bittman, Junius Creamer, PA-C    Family History Family History  Problem Relation Age of Onset  . Diabetes Sister   . Hypertension Sister   .  Hypertension Mother   . Hypertension Father     Social History Social History   Tobacco Use  . Smoking status: Former Smoker    Quit date: 12/22/2013    Years since quitting: 5.7  . Smokeless tobacco: Never Used  Substance Use Topics  . Alcohol use: No    Alcohol/week: 0.0 standard drinks  . Drug use: No     Allergies   Patient has no known allergies.   Review of Systems Review of Systems  Constitutional: Negative for activity change, appetite change, chills, fatigue and fever.  HENT: Positive for congestion, rhinorrhea and sinus pressure. Negative for ear pain, sore throat and trouble swallowing.   Eyes: Negative for discharge and redness.  Respiratory: Positive for shortness of breath and wheezing. Negative for cough and chest tightness.   Cardiovascular: Negative for chest pain.  Gastrointestinal: Negative for abdominal pain, diarrhea, nausea and vomiting.  Musculoskeletal: Negative for myalgias.  Skin: Negative for rash.  Neurological: Negative for dizziness, light-headedness and headaches.     Physical Exam Triage Vital Signs ED Triage Vitals  Enc Vitals Group     BP 09/20/19 1700 (!) 140/99     Pulse Rate 09/20/19 1700 (!) 103     Resp 09/20/19 1700 20     Temp 09/20/19 1700 98.8 F (37.1 C)     Temp Source 09/20/19 1700 Oral     SpO2 09/20/19 1700 97 %     Weight --      Height --      Head Circumference --      Peak Flow --      Pain Score 09/20/19 1704 5     Pain Loc --      Pain Edu? --      Excl. in GC? --    No data found.  Updated Vital Signs BP (!) 140/99 (BP Location: Left Arm)   Pulse (!) 103   Temp 98.8 F (37.1 C) (Oral)   Resp 20   LMP 08/28/2019   SpO2 97%   Visual Acuity Right Eye Distance:   Left Eye Distance:   Bilateral Distance:    Right Eye Near:   Left Eye Near:    Bilateral Near:     Physical Exam Vitals and nursing note reviewed.  Constitutional:      General: She is not in acute distress.    Appearance: She  is well-developed.  HENT:     Head: Normocephalic and atraumatic.     Ears:     Comments: Bilateral ears without tenderness to palpation of external auricle, tragus and mastoid, EAC's without erythema or swelling, TM's with good bony landmarks and cone of light. Non erythematous.     Nose:     Comments: Nasal mucosa erythematous, significantly swollen turbinates    Mouth/Throat:     Comments: Oral mucosa pink and moist, no tonsillar enlargement or exudate. Posterior pharynx patent and nonerythematous,  no uvula deviation or swelling. Normal phonation. Eyes:     Conjunctiva/sclera: Conjunctivae normal.  Cardiovascular:     Rate and Rhythm: Normal rate and regular rhythm.     Heart sounds: No murmur.  Pulmonary:     Effort: Pulmonary effort is normal. No respiratory distress.     Breath sounds: Normal breath sounds.     Comments: Breathing comfortably at rest, CTABL, no wheezing, rales or other adventitious sounds auscultated Abdominal:     Palpations: Abdomen is soft.     Tenderness: There is no abdominal tenderness.  Musculoskeletal:     Cervical back: Neck supple.  Skin:    General: Skin is warm and dry.  Neurological:     Mental Status: She is alert.      UC Treatments / Results  Labs (all labs ordered are listed, but only abnormal results are displayed) Labs Reviewed  POCT URINALYSIS DIP (DEVICE) - Abnormal; Notable for the following components:      Result Value   Hgb urine dipstick TRACE (*)    Leukocytes,Ua SMALL (*)    All other components within normal limits  NOVEL CORONAVIRUS, NAA (HOSP ORDER, SEND-OUT TO REF LAB; TAT 18-24 HRS)  URINE CULTURE    EKG   Radiology No results found.  Procedures Procedures (including critical care time)  Medications Ordered in UC Medications - No data to display  Initial Impression / Assessment and Plan / UC Course  I have reviewed the triage vital signs and the nursing notes.  Pertinent labs & imaging results that  were available during my care of the patient were reviewed by me and considered in my medical decision making (see chart for details).     Small leuks will send for urine culture.  Placing on Augmentin for sinusitis, would expect this to cover UTI as well.  Prednisone x5 days along with continuing Flonase.  Albuterol inhaler as needed for wheezing.  Lungs clear currently.  Covid PCR pending.  Discussed strict return precautions. Patient verbalized understanding and is agreeable with plan.  Final Clinical Impressions(s) / UC Diagnoses   Final diagnoses:  Acute sinusitis with symptoms greater than 10 days  Dysuria     Discharge Instructions     Begin Augmentin twice daily for 1 week Begin prednisone 40 mg daily for the next 5 days, please take with food Continue Flonase, may pair with over-the-counter Mucinex, Zyrtec Albuterol inhaler as needed for shortness of breath and wheezing, 1 to 2 puffs every 4-6 hours  We will send the urine for culture to check for urinary tract infection, Augmentin should cover this as well, we will call you if we need to switch antibiotics.  Please follow-up if developing any worsening symptoms, fevers, shortness of breath, difficulty breathing or chest pain   ED Prescriptions    Medication Sig Dispense Auth. Provider   amoxicillin-clavulanate (AUGMENTIN) 875-125 MG tablet Take 1 tablet by mouth every 12 (twelve) hours for 7 days. 14 tablet Trinetta Alemu C, PA-C   predniSONE (DELTASONE) 20 MG tablet Take 2 tablets (40 mg total) by mouth daily with breakfast for 5 days. 10 tablet Chanette Demo C, PA-C   albuterol (VENTOLIN HFA) 108 (90 Base) MCG/ACT inhaler Inhale 1-2 puffs into the lungs every 6 (six) hours as needed for wheezing or shortness of breath. 18 g Ranny Wiebelhaus, [place] C, PA-C     PDMP not reviewed this encounter.   Lew Dawes, [place] 09/20/19 [phone]

## 2019-09-20 NOTE — ED Triage Notes (Signed)
Pt c/o general malaise/ill, nasal congestion, SOB x 86month. States had call with PMD approx 1 week ago and given rx for cough, congestion.  Using OTC "sinus pills" and flonase w/o improvement symptoms.  Denies CP, HA, abd pain, n/v/d, fever, chills.  Bilat eyelids edematous. Pt states she had false eyelashes placed in mid December, only wore them 1 week and had them removed 2/2 to swelling. Pt states swelling not improved in past month.  C/o burning with urination x2-3 days ago.

## 2019-09-21 LAB — URINE CULTURE

## 2019-09-21 LAB — NOVEL CORONAVIRUS, NAA (HOSP ORDER, SEND-OUT TO REF LAB; TAT 18-24 HRS): SARS-CoV-2, NAA: NOT DETECTED

## 2019-09-22 ENCOUNTER — Other Ambulatory Visit: Payer: Self-pay

## 2019-09-22 ENCOUNTER — Ambulatory Visit: Payer: Self-pay

## 2019-10-27 ENCOUNTER — Ambulatory Visit: Payer: Self-pay | Admitting: Infectious Disease

## 2019-11-05 ENCOUNTER — Encounter: Payer: Self-pay | Admitting: Internal Medicine

## 2019-11-05 ENCOUNTER — Ambulatory Visit (INDEPENDENT_AMBULATORY_CARE_PROVIDER_SITE_OTHER): Payer: Self-pay | Admitting: Internal Medicine

## 2019-11-05 ENCOUNTER — Other Ambulatory Visit: Payer: Self-pay

## 2019-11-05 DIAGNOSIS — J019 Acute sinusitis, unspecified: Secondary | ICD-10-CM

## 2019-11-05 MED ORDER — AMOXICILLIN-POT CLAVULANATE 875-125 MG PO TABS: 1.0000 | ORAL_TABLET | Freq: Two times a day (BID) | ORAL | 0 refills | Status: DC

## 2019-11-05 NOTE — Assessment & Plan Note (Addendum)
Sinusitis: Symptom onset was 2 months of symptmos. There is swelling under and above her eyes and swelling under her ears which resembles lymphadenopathy. She has sinus congestion with green mucus discharge associated with headaches, loss of smell, soft tissue edema of the face, and sinus tenderness.  Denied fever, chills, cough or dyspnea. Denied earache or vision changes. There are no acute focal deficits per her description nor do I appreciate other symptoms requiring an urgent referral  Most recent CD4 count was 471 with undetectable HIV RNA.  Plan: Given the duration of symptoms and the severity we will treat with Augmentin 875/125 BID for 7 days.  The patient was advised to begin sinus rinses with a Neti pot or similar saline rinse system She was advised to use Flonase up to twice daily after the nasal rinses.  She is to notify us if her symptoms improve and then worsen or she develops fever, weakness, vision changes, confusion or other concerning symptoms.

## 2019-11-05 NOTE — Progress Notes (Signed)
  [facility] Internal Medicine Residency Telephone Encounter Continuity Care Appointment  HPI:   This telephone encounter was created for [name] on 11/05/2019 for the following purpose/cc headache.   Past Medical History:  Past Medical History:  Diagnosis Date  . Chest pressure 09/16/2017  . Exertional chest pain 05/13/2017  . Headache 02/06/2017  . HIV (human immunodeficiency virus infection) ([facility])   . Hypertension   . 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 Lower extremity edema 01/12/2015  . Lung abscess ([facility])   . Nausea 02/06/2017  . Overweight 01/12/2015  . Pneumonia   . TB (tuberculosis)   . Tonsillitis   . Viral URI 09/16/2017  . Weight gain 10/27/2018      ROS:   ROS negative except as per HPI.   Assessment / Plan / Recommendations:   Please see A&P under problem oriented charting for assessment of the patient's acute and chronic medical conditions.   As always, pt is advised that if symptoms worsen or new symptoms arise, they should go to an urgent care facility or to to ER for further evaluation.   Consent and Medical Decision Making:   Patient discussed with Dr. Heide Spark  This is a telephone encounter between Monique [name] and Monique [name] on 11/05/2019 for headache. The visit was conducted with the patient located at home and [employer] at [facility]. The patient's identity was confirmed using their DOB and current address. The patient has consented to being evaluated through a telephone encounter and understands the associated risks (an examination cannot be done and the patient may need to come in for an appointment) / benefits (allows the patient to remain at home, decreasing exposure to coronavirus). I personally spent 17 minutes on medical discussion.

## 2019-11-06 NOTE — Progress Notes (Signed)
Internal Medicine Clinic Attending  Case discussed with Dr. Harbrecht at the time of the visit.  We reviewed the resident's history and exam and pertinent patient test results.  I agree with the assessment, diagnosis, and plan of care documented in the resident's note.   

## 2019-11-18 ENCOUNTER — Encounter (HOSPITAL_COMMUNITY): Payer: Self-pay

## 2019-11-18 ENCOUNTER — Ambulatory Visit (HOSPITAL_COMMUNITY)
Admission: EM | Admit: 2019-11-18 | Discharge: 2019-11-18 | Disposition: A | Discharge status: Home / Self Care | Payer: HRSA Program | Attending: Family Medicine | Admitting: Family Medicine

## 2019-11-18 ENCOUNTER — Other Ambulatory Visit: Payer: Self-pay

## 2019-11-18 DIAGNOSIS — R519 Headache, unspecified: Secondary | ICD-10-CM | POA: Diagnosis not present

## 2019-11-18 DIAGNOSIS — U071 COVID-19: Secondary | ICD-10-CM | POA: Diagnosis not present

## 2019-11-18 DIAGNOSIS — I10 Essential (primary) hypertension: Secondary | ICD-10-CM | POA: Insufficient documentation

## 2019-11-18 DIAGNOSIS — R220 Localized swelling, mass and lump, head: Secondary | ICD-10-CM | POA: Diagnosis not present

## 2019-11-18 DIAGNOSIS — Z87891 Personal history of nicotine dependence: Secondary | ICD-10-CM | POA: Diagnosis not present

## 2019-11-18 DIAGNOSIS — R0981 Nasal congestion: Secondary | ICD-10-CM | POA: Diagnosis present

## 2019-11-18 MED ORDER — PREDNISONE 10 MG (21) PO TBPK: ORAL_TABLET | Freq: Every day | ORAL | 0 refills | Status: DC

## 2019-11-18 NOTE — ED Triage Notes (Signed)
Pt presents with sinus congestion & pressure, facial swelling, and ongoing headache for past few days.

## 2019-11-18 NOTE — ED Provider Notes (Signed)
[facility]   [id number] 11/18/19 Arrival Time: 3500  ASSESSMENT & PLAN:  1. Nasal congestion   2. Facial swelling     Begin trial of: Meds ordered this encounter  Medications  . predniSONE (STERAPRED UNI-PAK 21 TAB) 10 MG (21) TBPK tablet    Sig: Take by mouth daily. Take as directed.    Dispense:  21 tablet    Refill:  0    OTC symptom care as needed. Ensure adequate fluid intake and rest.  Follow-up Information    [facility].   Specialty: Urgent Care Why: If worsening or failing to improve as anticipated. Contact information: [place] [address] [place] [phone]          Reviewed expectations re: course of current medical issues. Questions answered. Outlined signs and symptoms indicating need for more acute intervention. Patient verbalized understanding. After Visit Summary given.   SUBJECTIVE: History from: patient.  Monique [name] is a 49 y.o. female who was recently treated for a sinus infection; finished Augmentin; feels facial pressure improved. Over past few days reports return of facial pressure and mild facial swelling; no extremity swelling. Mild frontal headache. Feels eyelids are swollen at times. No specific aggravating or alleviating factors reported. No respiratory symptoms. No OTC treatment.  Social History   Tobacco Use  Smoking Status Former Smoker  . Quit date: 12/22/2013  . Years since quitting: 5.9  Smokeless Tobacco Never Used     OBJECTIVE:  Vitals:   11/18/19 1832  BP: (!) 149/93  Pulse: 96  Resp: 20  Temp: 99.3 F (37.4 C)  TempSrc: Oral  SpO2: 97%     General appearance: alert; no distress HEENT: nasal congestion; throat irritation likely secondary to post-nasal drainage; bilateral maxillary tenderness to palpation; turbinates boggy; upper eyelids do look a little swollen Neck: supple without LAD; trachea midline Lungs: unlabored  respirations, cough: absent; speaks full sentences without  Ext: no hand or feet swelling Skin: warm and dry Psychological: alert and cooperative; normal mood and affect  No Known Allergies  Past Medical History:  Diagnosis Date  . Chest pressure 09/16/2017  . Exertional chest pain 05/13/2017  . Headache 02/06/2017  . HIV (human immunodeficiency virus infection) ([place])   . Hypertension   . 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 Lower extremity edema 01/12/2015  . Lung abscess ([place])   . Nausea 02/06/2017  . Overweight 01/12/2015  . Pneumonia   . TB (tuberculosis)   . Tonsillitis   . Viral URI 09/16/2017  . Weight gain 10/27/2018   Family History  Problem Relation Age of Onset  . Diabetes Sister   . Hypertension Sister   . Hypertension Mother   . Hypertension Father    Social History   Socioeconomic History  . Marital status: Single    Spouse name: Not on file  . Number of children: Not on file  . Years of education: Not on file  . Highest education level: Not on file  Occupational History  . Not on file  Tobacco Use  . Smoking status: Former Smoker    Quit date: 12/22/2013    Years since quitting: 5.9  . Smokeless tobacco: Never Used  Substance and Sexual Activity  . Alcohol use: No    Alcohol/week: 0.0 standard drinks  . Drug use: No  . Sexual activity: Yes    Partners: Male  Other Topics Concern  . Not  on file  Social History Narrative  . Not on file   Social Determinants of Health   Financial Resource Strain:   . Difficulty of Paying Living Expenses:   Food Insecurity:   . Worried About Programme researcher, broadcasting/film/video in the Last Year:   . Barista in the Last Year:   Transportation Needs:   . Freight forwarder (Medical):   Marland Kitchen Lack of Transportation (Non-Medical):   Physical Activity:   . Days of Exercise per Week:   . Minutes of Exercise per Session:   Stress:   . Feeling of Stress :   Social Connections:   .  Frequency of Communication with Friends and Family:   . Frequency of Social Gatherings with Friends and Family:   . Attends Religious Services:   . Active Member of Clubs or Organizations:   . Attends Banker Meetings:   Marland Kitchen Marital Status:   Intimate Partner Violence:   . Fear of Current or Ex-Partner:   . Emotionally Abused:   Marland Kitchen Physically Abused:   . Sexually Abused:             Mardella Layman, MD 11/18/19 [phone]

## 2019-11-18 NOTE — Discharge Instructions (Addendum)
You have been tested for COVID-19 today. °If your test returns positive, you will receive a phone call from [facility] regarding your results. °Negative test results are not called. °Both positive and negative results area always visible on MyChart. °If you do not have a MyChart account, sign up instructions are provided in your discharge papers. °Please do not hesitate to contact us should you have questions or concerns. ° °

## 2019-11-19 ENCOUNTER — Telehealth (HOSPITAL_COMMUNITY): Payer: Self-pay

## 2019-11-19 ENCOUNTER — Ambulatory Visit: Payer: Self-pay

## 2019-11-19 LAB — SARS CORONAVIRUS 2 (TAT 6-24 HRS): SARS Coronavirus 2: POSITIVE — AB

## 2019-11-19 NOTE — Telephone Encounter (Signed)
Positive results reviewed with patient.   Your test for COVID-19 was positive ("detected"), meaning that you were infected with the novel coronavirus and could give the germ to others.   Please continue isolation at home, for at least 10 days since the start of your fever/cough/breathlessness and until you have had 24 hours without fever (without taking a fever reducer) and with any cough/breathlessness improving. Use over-the-counter medications for symptoms. Quarantine period March 20-March 30.    Please continue good preventive care measures, including: frequent hand-washing, avoid touching your face, cover coughs/sneezes, stay out of crowds and keep a 6 foot distance from others. Clean hard surfaces touched frequently with disinfectant cleaning products.   Please check in with your primary care provider about your positive test result. Go to the nearest urgent care or ED for assessment if you have severe breathlessness or severe weakness/fatigue (ex needing new help getting out of bed or to the bathroom).  Members of your household will also need to quarantine for 14 days from the date of your positive test. You may be contacted to discuss possible treatment options, and you may also be contacted by the health department for follow up. Please call [place] at [phone] if you have any questions or concerns.  Work note sent through [employer].   Please call with questions.   Leretha Dykes, RN

## 2019-11-20 ENCOUNTER — Encounter: Payer: Self-pay | Admitting: Physician Assistant

## 2019-11-20 ENCOUNTER — Telehealth: Payer: Self-pay | Admitting: Physician Assistant

## 2019-11-20 DIAGNOSIS — I10 Essential (primary) hypertension: Secondary | ICD-10-CM | POA: Insufficient documentation

## 2019-11-20 NOTE — Telephone Encounter (Signed)
Called to discuss with patient about Covid symptoms and the use of bamlanivimab or casirivimab/imdevimab, a monoclonal antibody infusion for those with mild to moderate Covid symptoms and at a high risk of hospitalization.  Pt is qualified for this infusion at the [facility] infusion center due to BMI>35, HIV, HTN   Not able to leave a VM because full. Sent a Wellsite geologist.   [name] PA-C  [facility]

## 2019-11-22 ENCOUNTER — Encounter: Payer: Self-pay | Admitting: Physician Assistant

## 2019-11-22 ENCOUNTER — Other Ambulatory Visit: Payer: Self-pay | Admitting: Physician Assistant

## 2019-11-22 DIAGNOSIS — B20 Human immunodeficiency virus [HIV] disease: Secondary | ICD-10-CM

## 2019-11-22 DIAGNOSIS — U071 COVID-19: Secondary | ICD-10-CM

## 2019-11-22 MED ORDER — SODIUM CHLORIDE 0.9 % IV SOLN
700.0000 mg | Freq: Once | INTRAVENOUS | Status: AC
  Administered 2019-11-23: 700 mg via INTRAVENOUS
  Filled 2019-11-22: qty 700

## 2019-11-22 NOTE — Progress Notes (Signed)
  I connected by phone with Monique [name] on 11/22/2019 at 3:34 PM to discuss the potential use of an new treatment for mild to moderate COVID-19 viral infection in non-hospitalized patients.  This patient is a 49 y.o. female that meets the FDA criteria for Emergency Use Authorization of bamlanivimab or casirivimab\imdevimab.  Has a (+) direct SARS-CoV-2 viral test result  Has mild or moderate COVID-19   Is ? 50 years of age and weighs ? 40 kg  Is NOT hospitalized due to COVID-19  Is NOT requiring oxygen therapy or requiring an increase in baseline oxygen flow rate due to COVID-19  Is within 10 days of symptom onset  Has at least one of the high risk factor(s) for progression to severe COVID-19 and/or hospitalization as defined in EUA.  Specific high risk criteria : Immunosuppressive Disease   I have spoken and communicated the following to the patient or parent/caregiver:  1. FDA has authorized the emergency use of bamlanivimab and casirivimab\imdevimab for the treatment of mild to moderate COVID-19 in adults and pediatric patients with positive results of direct SARS-CoV-2 viral testing who are 5 years of age and older weighing at least 40 kg, and who are at high risk for progressing to severe COVID-19 and/or hospitalization.  2. The significant known and potential risks and benefits of bamlanivimab and casirivimab\imdevimab, and the extent to which such potential risks and benefits are unknown.  3. Information on available alternative treatments and the risks and benefits of those alternatives, including clinical trials.  4. Patients treated with bamlanivimab and casirivimab\imdevimab should continue to self-isolate and use infection control measures (e.g., wear mask, isolate, social distance, avoid sharing personal items, clean and disinfect "high touch" surfaces, and frequent handwashing) according to CDC guidelines.   5. The patient or parent/caregiver has the option to  accept or refuse bamlanivimab or casirivimab\imdevimab .  After reviewing this information with the patient, The patient agreed to proceed with receiving the bamlanimivab infusion and will be provided a copy of the Fact sheet prior to receiving the infusion.   Pt set up for Mab infusion tomorrow 11/23/19 @ 10:30am. Sx onset 3/21.  [name] 11/22/2019 3:34 PM

## 2019-11-23 ENCOUNTER — Telehealth: Payer: Self-pay

## 2019-11-23 ENCOUNTER — Ambulatory Visit (HOSPITAL_COMMUNITY)
Admission: RE | Admit: 2019-11-23 | Discharge: 2019-11-23 | Disposition: A | Discharge status: Home / Self Care | Payer: HRSA Program | Source: Ambulatory Visit | Attending: Pulmonary Disease | Admitting: Pulmonary Disease

## 2019-11-23 ENCOUNTER — Other Ambulatory Visit: Payer: Self-pay

## 2019-11-23 ENCOUNTER — Ambulatory Visit (INDEPENDENT_AMBULATORY_CARE_PROVIDER_SITE_OTHER): Payer: HRSA Program | Admitting: Infectious Disease

## 2019-11-23 ENCOUNTER — Encounter: Payer: Self-pay | Admitting: Infectious Disease

## 2019-11-23 DIAGNOSIS — J019 Acute sinusitis, unspecified: Secondary | ICD-10-CM

## 2019-11-23 DIAGNOSIS — J45909 Unspecified asthma, uncomplicated: Secondary | ICD-10-CM

## 2019-11-23 DIAGNOSIS — B20 Human immunodeficiency virus [HIV] disease: Secondary | ICD-10-CM

## 2019-11-23 DIAGNOSIS — I10 Essential (primary) hypertension: Secondary | ICD-10-CM | POA: Diagnosis not present

## 2019-11-23 DIAGNOSIS — R0981 Nasal congestion: Secondary | ICD-10-CM

## 2019-11-23 DIAGNOSIS — U071 COVID-19: Secondary | ICD-10-CM | POA: Diagnosis present

## 2019-11-23 DIAGNOSIS — E6609 Other obesity due to excess calories: Secondary | ICD-10-CM

## 2019-11-23 DIAGNOSIS — Z7951 Long term (current) use of inhaled steroids: Secondary | ICD-10-CM

## 2019-11-23 HISTORY — DX: COVID-19: U07.1

## 2019-11-23 MED ORDER — AMLODIPINE BESYLATE 10 MG PO TABS: 10.0000 mg | ORAL_TABLET | Freq: Every day | ORAL | 11 refills | Status: DC

## 2019-11-23 MED ORDER — HYDROCHLOROTHIAZIDE 25 MG PO TABS: 25.0000 mg | ORAL_TABLET | Freq: Every day | ORAL | 11 refills | Status: DC

## 2019-11-23 MED ORDER — METHYLPREDNISOLONE SODIUM SUCC 125 MG IJ SOLR: 125.0000 mg | Freq: Once | INTRAMUSCULAR | Status: DC | PRN

## 2019-11-23 MED ORDER — ALBUTEROL SULFATE HFA 108 (90 BASE) MCG/ACT IN AERS: 1.0000 | INHALATION_SPRAY | Freq: Four times a day (QID) | RESPIRATORY_TRACT | 5 refills | Status: DC | PRN

## 2019-11-23 MED ORDER — ALBUTEROL SULFATE HFA 108 (90 BASE) MCG/ACT IN AERS: 2.0000 | INHALATION_SPRAY | Freq: Once | RESPIRATORY_TRACT | Status: DC | PRN

## 2019-11-23 MED ORDER — DIPHENHYDRAMINE HCL 50 MG/ML IJ SOLN: 50.0000 mg | Freq: Once | INTRAMUSCULAR | Status: DC | PRN

## 2019-11-23 MED ORDER — FAMOTIDINE IN NACL 20-0.9 MG/50ML-% IV SOLN: 20.0000 mg | Freq: Once | INTRAVENOUS | Status: DC | PRN

## 2019-11-23 MED ORDER — FLUTICASONE PROPIONATE 50 MCG/ACT NA SUSP: 2.0000 | Freq: Every day | NASAL | 5 refills | Status: DC

## 2019-11-23 MED ORDER — BIKTARVY 50-200-25 MG PO TABS: 1.0000 | ORAL_TABLET | Freq: Every day | ORAL | 11 refills | Status: DC

## 2019-11-23 MED ORDER — EPINEPHRINE 0.3 MG/0.3ML IJ SOAJ: 0.3000 mg | Freq: Once | INTRAMUSCULAR | Status: DC | PRN

## 2019-11-23 MED ORDER — SODIUM CHLORIDE 0.9 % IV SOLN: INTRAVENOUS | Status: DC | PRN

## 2019-11-23 NOTE — Progress Notes (Signed)
  Diagnosis: COVID-19  Physician: Dr. Delford Field  Procedure: Covid Infusion Clinic Med: bamlanivimab infusion - Provided patient with bamlanimivab fact sheet for patients, parents and caregivers prior to infusion.  Complications: No immediate complications noted.  Discharge: Discharged home   Essie Hart 11/23/2019

## 2019-11-23 NOTE — Progress Notes (Signed)
Virtual Visit via Telephone Note  I connected with [name] on 11/23/19 at  9:15 AM EDT by telephone and verified that I am speaking with the correct person using two identifiers.  Location: Patient: Home Provider: [facility]   I discussed the limitations, risks, security and privacy concerns of performing an evaluation and management service by telephone and the availability of in person appointments. I also discussed with the patient that there may be a patient responsible charge related to this service. The patient expressed understanding and agreed to proceed.   History of Present Illness:  Monique [name] is a 49 year old African-American lady living with HIV that is been perfectly controlled recently on Biktarvy.  She does have comorbid hypertension and obesity.  She is followed in internal medicine clinic.  She is recently diagnosed with COVID-19 infection with positive test on March 24.  She had symptoms of sinus congestion and a severe headache also with loss of taste and smell.  She has been given steroids which have helped with her breathing.  She has been without fevers but is taking Tylenol for body aches.  She is set up for an infusion at the [facility] today of monoclonal antibody.  Encourage her to follow through and go to that appointment to get the infusion.  She was initially reluctant to do so.  She did have contact symptoms earlier actually in March and seen internal medicine clinic and given Augmentin for sinusitis.  At that time she had vaginally and had symptoms for 2 months prior to being seen there.  Review of systems as above otherwise 12 point review systems negative   Past Medical History:  Diagnosis Date  . COVID-19 virus infection 11/23/2019  . Exertional chest pain 05/13/2017  . HIV (human immunodeficiency virus infection) (HCC)   . Hypertension   . 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 Lower extremity  edema 01/12/2015  . Lung abscess (HCC)   . Morbid obesity (HCC)   . Pneumonia   . TB (tuberculosis)   . Tonsillitis   . Viral URI 09/16/2017  . Weight gain 10/27/2018    Past Surgical History:  Procedure Laterality Date  . CESAREAN SECTION    . VIDEO BRONCHOSCOPY Bilateral 04/13/2013   Procedure: VIDEO BRONCHOSCOPY WITHOUT FLUORO;  Surgeon: Kalman Shan, MD;  Location: [facility] ENDOSCOPY;  Service: Cardiopulmonary;  Laterality: Bilateral;    Family History  Problem Relation Age of Onset  . Diabetes Sister   . Hypertension Sister   . Hypertension Mother   . Hypertension Father       Social History   Socioeconomic History  . Marital status: Single    Spouse name: Not on file  . Number of children: Not on file  . Years of education: Not on file  . Highest education level: Not on file  Occupational History  . Not on file  Tobacco Use  . Smoking status: Former Smoker    Quit date: 12/22/2013    Years since quitting: 5.9  . Smokeless tobacco: Never Used  Substance and Sexual Activity  . Alcohol use: No    Alcohol/week: 0.0 standard drinks  . Drug use: No  . Sexual activity: Yes    Partners: Male  Other Topics Concern  . Not on file  Social History Narrative  . Not on file   Social Determinants of Health   Financial Resource Strain:   . Difficulty of Paying Living Expenses:   Food Insecurity:   .  Worried About Charity fundraiser in the Last Year:   . Arboriculturist in the Last Year:   Transportation Needs:   . Film/video editor (Medical):   Marland Kitchen Lack of Transportation (Non-Medical):   Physical Activity:   . Days of Exercise per Week:   . Minutes of Exercise per Session:   Stress:   . Feeling of Stress :   Social Connections:   . Frequency of Communication with Friends and Family:   . Frequency of Social Gatherings with Friends and Family:   . Attends Religious Services:   . Active Member of Clubs or Organizations:   . Attends Archivist Meetings:    Marland Kitchen Marital Status:     No Known Allergies   Current Outpatient Medications:  .  acetaminophen (TYLENOL) 500 MG tablet, Take 2 tablets (1,000 mg total) by mouth every 8 (eight) hours as needed for mild pain or fever., Disp: 30 tablet, Rfl: 0 .  albuterol (VENTOLIN HFA) 108 (90 Base) MCG/ACT inhaler, Inhale 1-2 puffs into the lungs every 6 (six) hours as needed for wheezing or shortness of breath., Disp: 18 g, Rfl: 5 .  amLODipine (NORVASC) 10 MG tablet, Take 1 tablet (10 mg total) by mouth daily., Disp: 30 tablet, Rfl: 11 .  bictegravir-emtricitabine-tenofovir AF (BIKTARVY) 50-200-25 MG TABS tablet, Take 1 tablet by mouth daily., Disp: 30 tablet, Rfl: 11 .  fluticasone (FLONASE) 50 MCG/ACT nasal spray, Place 2 sprays into both nostrils daily., Disp: 1 g, Rfl: 5 .  hydrochlorothiazide (HYDRODIURIL) 25 MG tablet, Take 1 tablet (25 mg total) by mouth daily., Disp: 30 tablet, Rfl: 11 .  loratadine (CLARITIN) 10 MG tablet, Take 1 tablet (10 mg total) by mouth daily., Disp: 15 tablet, Rfl: 0 .  predniSONE (STERAPRED UNI-PAK 21 TAB) 10 MG (21) TBPK tablet, Take by mouth daily. Take as directed., Disp: 21 tablet, Rfl: 0 No current facility-administered medications for this visit.  Facility-Administered Medications Ordered in Other Visits:  .  bamlanivimab 700 mg in sodium chloride 0.9 % 120 mL IVPB, 700 mg, Intravenous, Once, Eileen Stanford, PA-C    Observations/Objective:  Shantele appears to be improving in terms of her symptoms of COVID-19 infection I do think she should get a monoclonal antibody.  She has been highly adherent to her antiretrovirals  Assessment and Plan:  COVID-19 infection: Would have her go to the infusion center today and get her monoclonal antibiotic infusion  HIV disease continue Biktarvy and refill sent to [facility] on [address] she has renewed her HMA P program would be helpful if she could use that to get an ACA plan  Hypertension: To new amlodipine and  thiazide diuretic.  Obesity continue to try to restrict carbohydrates lose weight.  Asthma: Continue inhaled corticosteroid and albuterol again this would be a reason for her to get an ACA plans of these medications would be covered.   Follow Up Instructions:    I discussed the assessment and treatment plan with the patient. The patient was provided an opportunity to ask questions and all were answered. The patient agreed with the plan and demonstrated an understanding of the instructions.   The patient was advised to call back or seek an in-person evaluation if the symptoms worsen or if the condition fails to improve as anticipated.  I provided 15 minutes of non-face-to-face time during this encounter.   Alcide Evener, MD

## 2019-11-23 NOTE — Telephone Encounter (Signed)
-----   Message from Randall Hiss, MD sent at 11/22/2019  5:52 PM EDT ----- Regarding: FW: visit tomorrow Thx so much and she should def be virtual ----- Message ----- From: Janetta Hora, PA-C Sent: 11/22/2019   3:24 PM EDT To: Randall Hiss, MD Subject: visit tomorrow                                 Pt covid positive. Has appt with you tomorrow at 9:15am. I am going to get her set up for monoclonal antibody infusion. Just wanted to give you a heads up as I told her you would probably want to switch to virtual or reschedule.    [name] PA-C

## 2019-11-23 NOTE — Discharge Instructions (Signed)

## 2019-11-23 NOTE — Telephone Encounter (Signed)
Called patient to offer evisit for today's visit. Patient is okay with having visit converted.  Lorenso Courier, [place]

## 2019-12-24 ENCOUNTER — Encounter: Payer: Self-pay | Admitting: Internal Medicine

## 2019-12-24 ENCOUNTER — Ambulatory Visit: Payer: Self-pay | Admitting: Internal Medicine

## 2019-12-24 VITALS — BP 163/99 | HR 98 | Temp 98.7°F | Ht 62.0 in | Wt 229.0 lb

## 2019-12-24 DIAGNOSIS — J320 Chronic maxillary sinusitis: Secondary | ICD-10-CM | POA: Insufficient documentation

## 2019-12-24 MED ORDER — CETIRIZINE HCL 10 MG PO TABS: 10.0000 mg | ORAL_TABLET | Freq: Every day | ORAL | 2 refills | Status: AC

## 2019-12-24 MED ORDER — DOXYCYCLINE HYCLATE 100 MG PO TABS: 100.0000 mg | ORAL_TABLET | Freq: Two times a day (BID) | ORAL | 0 refills | Status: AC

## 2019-12-24 NOTE — Patient Instructions (Signed)
Monique [name], It was nice meeting you!  Today we discussed your nasal congestion: - I'd like you to start taking Zyrtec once daily - Continue using the neti pot and flonase  - We will see if you get any improvement with a different type of antibiotic which you will take for 7 days  - I am also placing a referral for you to see an allergist   Hope you feel better! Dr. Chesley Mires

## 2019-12-24 NOTE — Assessment & Plan Note (Signed)
Patient present with several month history of nasal congestion and sinus pressure, particularly on the left side. She was initially prescribed Augmentin with no improvement. Was placed on short course of Prednisone which gave her temporarily relief but had immediate return of symptoms. She was then diagnosed with COVID-19 and underwent treatment as an outpatient with infusion on 3/29. She denies any fevers, chills, ear pain, cough, sore throat, shortness of breath. She has been using neti pot and flonase with mild relief.  Suspect she has allergic rhinitis and developed superimposing viral or bacterial infection. Given her persistent symptoms despite recovering from COVID-19, will try a different course of antibiotic for 7 days to see if she gets any relief. Also instructed her to start a daily antihistamine and will refer her to an allergist for further work-up and treatment.

## 2019-12-24 NOTE — Progress Notes (Signed)
Acute Office Visit  Subjective:    Patient ID: Monique [name], female    DOB: April 14, 1971, 49 y.o.   MRN: [id number]  Chief Complaint  Patient presents with  . Follow-up    sinusitis    HPI Patient is in today for nasal congestion. Please see problem based charting for further details.   Past Medical History:  Diagnosis Date  . COVID-19 virus infection 11/23/2019  . Exertional chest pain 05/13/2017  . HIV (human immunodeficiency virus infection) ([place])   . Hypertension   . 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 Lower extremity edema 01/12/2015  . Lung abscess ([place])   . Morbid obesity ([place])   . Pneumonia   . TB (tuberculosis)   . Tonsillitis   . Viral URI 09/16/2017  . Weight gain 10/27/2018    Past Surgical History:  Procedure Laterality Date  . CESAREAN SECTION    . VIDEO BRONCHOSCOPY Bilateral 04/13/2013   Procedure: VIDEO BRONCHOSCOPY WITHOUT FLUORO;  Surgeon: Brand Males, MD;  Location: [facility];  Service: Cardiopulmonary;  Laterality: Bilateral;    Family History  Problem Relation Age of Onset  . Diabetes Sister   . Hypertension Sister   . Hypertension Mother   . Hypertension Father     Social History   Socioeconomic History  . Marital status: Single    Spouse name: Not on file  . Number of children: Not on file  . Years of education: Not on file  . Highest education level: Not on file  Occupational History  . Not on file  Tobacco Use  . Smoking status: Former Smoker    Quit date: 12/22/2013    Years since quitting: 6.0  . Smokeless tobacco: Never Used  Substance and Sexual Activity  . Alcohol use: No    Alcohol/week: 0.0 standard drinks  . Drug use: No  . Sexual activity: Yes    Partners: Male  Other Topics Concern  . Not on file  Social History Narrative  . Not on file   Social Determinants of Health   Financial Resource Strain:   . Difficulty of Paying Living Expenses:   Food  Insecurity:   . Worried About Charity fundraiser in the Last Year:   . Arboriculturist in the Last Year:   Transportation Needs:   . Film/video editor (Medical):   Marland Kitchen Lack of Transportation (Non-Medical):   Physical Activity:   . Days of Exercise per Week:   . Minutes of Exercise per Session:   Stress:   . Feeling of Stress :   Social Connections:   . Frequency of Communication with Friends and Family:   . Frequency of Social Gatherings with Friends and Family:   . Attends Religious Services:   . Active Member of Clubs or Organizations:   . Attends Archivist Meetings:   Marland Kitchen Marital Status:   Intimate Partner Violence:   . Fear of Current or Ex-Partner:   . Emotionally Abused:   Marland Kitchen Physically Abused:   . Sexually Abused:     Outpatient Medications Prior to Visit  Medication Sig Dispense Refill  . acetaminophen (TYLENOL) 500 MG tablet Take 2 tablets (1,000 mg total) by mouth every 8 (eight) hours as needed for mild pain or fever. 30 tablet 0  . albuterol (VENTOLIN HFA) 108 (90 Base) MCG/ACT inhaler Inhale 1-2 puffs into the lungs every 6 (six) hours as needed for wheezing or shortness of breath.  18 g 5  . amLODipine (NORVASC) 10 MG tablet Take 1 tablet (10 mg total) by mouth daily. 30 tablet 11  . bictegravir-emtricitabine-tenofovir AF (BIKTARVY) 50-200-25 MG TABS tablet Take 1 tablet by mouth daily. 30 tablet 11  . fluticasone (FLONASE) 50 MCG/ACT nasal spray Place 2 sprays into both nostrils daily. 1 g 5  . hydrochlorothiazide (HYDRODIURIL) 25 MG tablet Take 1 tablet (25 mg total) by mouth daily. 30 tablet 11  . loratadine (CLARITIN) 10 MG tablet Take 1 tablet (10 mg total) by mouth daily. 15 tablet 0  . predniSONE (STERAPRED UNI-PAK 21 TAB) 10 MG (21) TBPK tablet Take by mouth daily. Take as directed. 21 tablet 0   No facility-administered medications prior to visit.    No Known Allergies  Review of Systems  Constitutional: Negative for chills and fever.    HENT: Positive for facial swelling and sinus pain. Negative for ear discharge, ear pain, hearing loss, sore throat and tinnitus.   Respiratory: Negative for cough and shortness of breath.   Neurological: Negative for dizziness and headaches.       Objective:    Physical Exam Constitutional:      General: She is not in acute distress.    Appearance: Normal appearance.  HENT:     Right Ear: Hearing, tympanic membrane, ear canal and external ear normal.     Left Ear: Hearing, tympanic membrane, ear canal and external ear normal.     Nose: Mucosal edema and congestion present.     Right Turbinates: Swollen.     Left Turbinates: Swollen.     Right Sinus: Maxillary sinus tenderness present. No frontal sinus tenderness.     Left Sinus: Maxillary sinus tenderness present. No frontal sinus tenderness.     Mouth/Throat:     Mouth: Mucous membranes are moist.     Pharynx: Oropharynx is clear.  Pulmonary:     Effort: Pulmonary effort is normal.  Musculoskeletal:     Cervical back: Neck supple.  Neurological:     General: No focal deficit present.     Mental Status: She is alert and oriented to person, place, and time.  Psychiatric:        Mood and Affect: Mood normal.        Behavior: Behavior normal.     BP (!) 163/99 (BP Location: Right Arm, Patient Position: Sitting, Cuff Size: Small)   Pulse 98   Temp 98.7 F (37.1 C) (Oral)   Ht 5\' 2"  (1.575 m)   Wt 229 lb (103.9 kg)   SpO2 96%   BMI 41.88 kg/m  Wt Readings from Last 3 Encounters:  12/24/19 229 lb (103.9 kg)  01/26/19 231 lb 3.2 oz (104.9 kg)  01/17/19 220 lb (99.8 kg)    Health Maintenance Due  Topic Date Due  . COVID-19 Vaccine (1) Never done  . PAP SMEAR-Modifier  04/23/2017    There are no preventive care reminders to display for this patient.   No results found for: TSH Lab Results  Component Value Date   WBC 6.5 09/03/2019   HGB 11.8 09/03/2019   HCT 36.5 09/03/2019   MCV 83.0 09/03/2019   PLT 301  09/03/2019   Lab Results  Component Value Date   NA 137 09/03/2019   K 4.3 09/03/2019   CO2 28 09/03/2019   GLUCOSE 89 09/03/2019   BUN 14 09/03/2019   CREATININE 0.86 09/03/2019   BILITOT 0.3 09/03/2019   ALKPHOS 56 02/06/2017   AST 25  09/03/2019   ALT 31 (H) 09/03/2019   PROT 6.9 09/03/2019   ALBUMIN 3.8 02/06/2017   CALCIUM 9.4 09/03/2019   ANIONGAP 9 09/21/2014   Lab Results  Component Value Date   CHOL 156 04/09/2019   Lab Results  Component Value Date   HDL 37 (L) 04/09/2019   Lab Results  Component Value Date   LDLCALC 89 04/09/2019   Lab Results  Component Value Date   TRIG 200 (H) 04/09/2019   Lab Results  Component Value Date   CHOLHDL 4.2 04/09/2019   Lab Results  Component Value Date   HGBA1C 5.7 (H) 03/16/2014       Assessment & Plan:   Problem List Items Addressed This Visit      Respiratory   Chronic maxillary sinusitis - Primary    Patient present with several month history of nasal congestion and sinus pressure, particularly on the left side. She was initially prescribed Augmentin with no improvement. Was placed on short course of Prednisone which gave her temporarily relief but had immediate return of symptoms. She was then diagnosed with COVID-19 and underwent treatment as an outpatient with infusion on 3/29. She denies any fevers, chills, ear pain, cough, sore throat, shortness of breath. She has been using neti pot and flonase with mild relief.  Suspect she has allergic rhinitis and developed superimposing viral or bacterial infection. Given her persistent symptoms despite recovering from COVID-19, will try a different course of antibiotic for 7 days to see if she gets any relief. Also instructed her to start a daily antihistamine and will refer her to an allergist for further work-up and treatment.       Relevant Medications   cetirizine (ZYRTEC ALLERGY) 10 MG tablet   doxycycline (VIBRA-TABS) 100 MG tablet   Other Relevant Orders    Ambulatory referral to Allergy       Meds ordered this encounter  Medications  . cetirizine (ZYRTEC ALLERGY) 10 MG tablet    Sig: Take 1 tablet (10 mg total) by mouth daily.    Dispense:  30 tablet    Refill:  2  . doxycycline (VIBRA-TABS) 100 MG tablet    Sig: Take 1 tablet (100 mg total) by mouth 2 (two) times daily for 7 days.    Dispense:  14 tablet    Refill:  0     Rim Thatch D Aseneth Hack, DO

## 2019-12-24 NOTE — Progress Notes (Signed)
Internal Medicine Clinic Attending  Case discussed with Dr. Bloomfield at the time of the visit.  We reviewed the resident's history and exam and pertinent patient test results.  I agree with the assessment, diagnosis, and plan of care documented in the resident's note.  

## 2020-01-20 ENCOUNTER — Encounter: Payer: Self-pay | Admitting: Infectious Disease

## 2020-02-01 ENCOUNTER — Ambulatory Visit: Payer: Self-pay | Admitting: Allergy and Immunology

## 2020-02-01 NOTE — Addendum Note (Signed)
Addended by: Neomia Dear on: 02/01/2020 05:53 PM   Modules accepted: Orders

## 2020-03-03 ENCOUNTER — Other Ambulatory Visit: Payer: Self-pay

## 2020-03-03 DIAGNOSIS — U071 COVID-19: Secondary | ICD-10-CM

## 2020-03-03 DIAGNOSIS — I10 Essential (primary) hypertension: Secondary | ICD-10-CM

## 2020-03-03 DIAGNOSIS — J019 Acute sinusitis, unspecified: Secondary | ICD-10-CM

## 2020-03-03 DIAGNOSIS — B20 Human immunodeficiency virus [HIV] disease: Secondary | ICD-10-CM

## 2020-03-04 LAB — T-HELPER CELL (CD4) - (RCID CLINIC ONLY)
CD4 % Helper T Cell: 29 % — ABNORMAL LOW (ref 33–65)
CD4 T Cell Abs: 340 /uL — ABNORMAL LOW (ref 400–1790)

## 2020-03-06 LAB — CBC WITH DIFFERENTIAL/PLATELET
Absolute Monocytes: 907 cells/uL (ref 200–950)
Basophils Absolute: 38 cells/uL (ref 0–200)
Basophils Relative: 0.6 %
Eosinophils Absolute: 271 cells/uL (ref 15–500)
Eosinophils Relative: 4.3 %
HCT: 35.2 % (ref 35.0–45.0)
Hemoglobin: 11.6 g/dL — ABNORMAL LOW (ref 11.7–15.5)
Lymphs Abs: 1197 cells/uL (ref 850–3900)
MCH: 27.1 pg (ref 27.0–33.0)
MCHC: 33 g/dL (ref 32.0–36.0)
MCV: 82.2 fL (ref 80.0–100.0)
MPV: 11 fL (ref 7.5–12.5)
Monocytes Relative: 14.4 %
Neutro Abs: 3887 cells/uL (ref 1500–7800)
Neutrophils Relative %: 61.7 %
Platelets: 286 10*3/uL (ref 140–400)
RBC: 4.28 10*6/uL (ref 3.80–5.10)
RDW: 15.7 % — ABNORMAL HIGH (ref 11.0–15.0)
Total Lymphocyte: 19 %
WBC: 6.3 10*3/uL (ref 3.8–10.8)

## 2020-03-06 LAB — COMPLETE METABOLIC PANEL WITH GFR
AG Ratio: 1 (calc) (ref 1.0–2.5)
ALT: 23 U/L (ref 6–29)
AST: 24 U/L (ref 10–35)
Albumin: 3.8 g/dL (ref 3.6–5.1)
Alkaline phosphatase (APISO): 88 U/L (ref 31–125)
BUN: 16 mg/dL (ref 7–25)
CO2: 27 mmol/L (ref 20–32)
Calcium: 9.3 mg/dL (ref 8.6–10.2)
Chloride: 105 mmol/L (ref 98–110)
Creat: 0.7 mg/dL (ref 0.50–1.10)
GFR, Est African American: 118 mL/min/{1.73_m2} (ref >=60)
GFR, Est Non African American: 102 mL/min/{1.73_m2} (ref >=60)
Globulin: 3.7 g/dL (calc) (ref 1.9–3.7)
Glucose, Bld: 90 mg/dL (ref 65–99)
Potassium: 3.9 mmol/L (ref 3.5–5.3)
Sodium: 137 mmol/L (ref 135–146)
Total Bilirubin: 0.3 mg/dL (ref 0.2–1.2)
Total Protein: 7.5 g/dL (ref 6.1–8.1)

## 2020-03-06 LAB — LIPID PANEL
Cholesterol: 195 mg/dL (ref <200)
HDL: 37 mg/dL — ABNORMAL LOW (ref >=50)
LDL Cholesterol (Calc): 133 mg/dL (calc) — ABNORMAL HIGH
Non-HDL Cholesterol (Calc): 158 mg/dL (calc) — ABNORMAL HIGH (ref <130)
Total CHOL/HDL Ratio: 5.3 (calc) — ABNORMAL HIGH (ref <5.0)
Triglycerides: 134 mg/dL (ref <150)

## 2020-03-06 LAB — HIV-1 RNA QUANT-NO REFLEX-BLD
HIV 1 RNA Quant: <20 copies/mL
HIV-1 RNA Quant, Log: <1.3 Log copies/mL

## 2020-03-06 LAB — RPR: RPR Ser Ql: NONREACTIVE

## 2020-03-17 ENCOUNTER — Telehealth: Payer: Self-pay

## 2020-03-17 NOTE — Telephone Encounter (Signed)
Patient called to inquire about whether or not she should receive the COVID vaccine. Instructed patient that all of our providers are recommending the vaccine and she should absolutely get it if possible.   Amberli Ruegg Loyola Mast, RN

## 2020-04-05 ENCOUNTER — Other Ambulatory Visit: Payer: Self-pay

## 2020-04-05 ENCOUNTER — Encounter: Payer: Self-pay | Admitting: Infectious Disease

## 2020-04-05 ENCOUNTER — Ambulatory Visit (INDEPENDENT_AMBULATORY_CARE_PROVIDER_SITE_OTHER): Payer: Self-pay | Admitting: Infectious Disease

## 2020-04-05 VITALS — BP 152/99 | HR 96 | Temp 98.1°F | Wt 230.0 lb

## 2020-04-05 DIAGNOSIS — U071 COVID-19: Secondary | ICD-10-CM

## 2020-04-05 DIAGNOSIS — J302 Other seasonal allergic rhinitis: Secondary | ICD-10-CM

## 2020-04-05 DIAGNOSIS — B20 Human immunodeficiency virus [HIV] disease: Secondary | ICD-10-CM

## 2020-04-05 DIAGNOSIS — R635 Abnormal weight gain: Secondary | ICD-10-CM

## 2020-04-05 DIAGNOSIS — I10 Essential (primary) hypertension: Secondary | ICD-10-CM

## 2020-04-05 MED ORDER — AMOXICILLIN-POT CLAVULANATE 875-125 MG PO TABS: 1.0000 | ORAL_TABLET | Freq: Two times a day (BID) | ORAL | 1 refills | Status: DC

## 2020-04-05 NOTE — Progress Notes (Signed)
Subjective:  Chief complaint persistent sinus congestion  Patient ID: Monique [name], female    DOB: [birth year], 49 y.o.   MRN: [id number]    HPI   Monique [name] is a 49 year old African-American lady who I am known for many years now is initially diagnosed with HIV and a CD4 nadir in the 70s.  She rapidly suppressed her virus and restored her T cells above 400.  She did gain quite a bit of weight after initially losing weight with her HIV infection and illness.  She says that she went down to 150 pounds.  Since then she has gained weight well above 200, go up to 204 pounds within the first 6 months of her treatment and now up to 30 pounds.  She was initially treated with Triumeq and then more recently with Biktarvy.  She is a candidate for an ACT G study looking at switching to DOravirine + Truvada vs Doravirine + Descovy vs staying on current regimen that she would be an excellent candidate for and we discussed this study.  Monique [name] did have COVID-19 infection this summer and we arrange for monoclonal antibody infusion.  Fortunately she was not hospitalized she has now had her first dose of Pfizer vaccine this past month and due for second dose in the coming weeks.  She has been suffering from sinusitis since she had the COVID-19 infection.  Of note she has had seasonal allergies in the past.  Apparently her attempts to treat this with over-the-counter medicines including Flonase have been unsuccessful.  I think is reasonable give her a course of antibacterial antibiotics to see if there is a component of bacterial superinfection complicating her sinus congestion.  Past Medical History:  Diagnosis Date  . COVID-19 virus infection 11/23/2019  . Exertional chest pain 05/13/2017  . HIV (human immunodeficiency virus infection) (HCC)   . Hypertension   . 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 Lower extremity edema 01/12/2015  . Lung abscess (HCC)   .  Morbid obesity (HCC)   . Pneumonia   . TB (tuberculosis)   . Tonsillitis   . Viral URI 09/16/2017  . Weight gain 10/27/2018    Past Surgical History:  Procedure Laterality Date  . CESAREAN SECTION    . VIDEO BRONCHOSCOPY Bilateral 04/13/2013   Procedure: VIDEO BRONCHOSCOPY WITHOUT FLUORO;  Surgeon: Kalman Shan, MD;  Location: [facility] ENDOSCOPY;  Service: Cardiopulmonary;  Laterality: Bilateral;    Family History  Problem Relation Age of Onset  . Diabetes Sister   . Hypertension Sister   . Hypertension Mother   . Hypertension Father       Social History   Socioeconomic History  . Marital status: Single    Spouse name: Not on file  . Number of children: Not on file  . Years of education: Not on file  . Highest education level: Not on file  Occupational History  . Not on file  Tobacco Use  . Smoking status: Former Smoker    Quit date: 12/22/2013    Years since quitting: 6.2  . Smokeless tobacco: Never Used  Vaping Use  . Vaping Use: Never used  Substance and Sexual Activity  . Alcohol use: No    Alcohol/week: 0.0 standard drinks  . Drug use: No  . Sexual activity: Not Currently    Partners: Male  Other Topics Concern  . Not on file  Social History Narrative  . Not on file   Social Determinants of Health  Financial Resource Strain:   . Difficulty of Paying Living Expenses:   Food Insecurity:   . Worried About Programme researcher, broadcasting/film/video in the Last Year:   . Barista in the Last Year:   Transportation Needs:   . Freight forwarder (Medical):   Marland Kitchen Lack of Transportation (Non-Medical):   Physical Activity:   . Days of Exercise per Week:   . Minutes of Exercise per Session:   Stress:   . Feeling of Stress :   Social Connections:   . Frequency of Communication with Friends and Family:   . Frequency of Social Gatherings with Friends and Family:   . Attends Religious Services:   . Active Member of Clubs or Organizations:   . Attends Banker  Meetings:   Marland Kitchen Marital Status:     No Known Allergies   Current Outpatient Medications:  .  acetaminophen (TYLENOL) 500 MG tablet, Take 2 tablets (1,000 mg total) by mouth every 8 (eight) hours as needed for mild pain or fever., Disp: 30 tablet, Rfl: 0 .  albuterol (VENTOLIN HFA) 108 (90 Base) MCG/ACT inhaler, Inhale 1-2 puffs into the lungs every 6 (six) hours as needed for wheezing or shortness of breath., Disp: 18 g, Rfl: 5 .  amLODipine (NORVASC) 10 MG tablet, Take 1 tablet (10 mg total) by mouth daily., Disp: 30 tablet, Rfl: 11 .  bictegravir-emtricitabine-tenofovir AF (BIKTARVY) 50-200-25 MG TABS tablet, Take 1 tablet by mouth daily., Disp: 30 tablet, Rfl: 11 .  cetirizine (ZYRTEC ALLERGY) 10 MG tablet, Take 1 tablet (10 mg total) by mouth daily., Disp: 30 tablet, Rfl: 2 .  fluticasone (FLONASE) 50 MCG/ACT nasal spray, Place 2 sprays into both nostrils daily., Disp: 1 g, Rfl: 5 .  hydrochlorothiazide (HYDRODIURIL) 25 MG tablet, Take 1 tablet (25 mg total) by mouth daily., Disp: 30 tablet, Rfl: 11 .  loratadine (CLARITIN) 10 MG tablet, Take 1 tablet (10 mg total) by mouth daily., Disp: 15 tablet, Rfl: 0 .  predniSONE (STERAPRED UNI-PAK 21 TAB) 10 MG (21) TBPK tablet, Take by mouth daily. Take as directed. (Patient not taking: Reported on 04/05/2020), Disp: 21 tablet, Rfl: 0   Review of Systems  Constitutional: Negative for activity change, appetite change, chills, diaphoresis, fatigue, fever and unexpected weight change.  HENT: Positive for congestion and facial swelling. Negative for rhinorrhea, sinus pressure, sneezing, sore throat and trouble swallowing.   Eyes: Negative for photophobia and visual disturbance.  Respiratory: Negative for cough, chest tightness, shortness of breath, wheezing and stridor.   Cardiovascular: Negative for chest pain, palpitations and leg swelling.  Gastrointestinal: Negative for abdominal distention, abdominal pain, anal bleeding, blood in stool,  constipation, diarrhea, nausea and vomiting.  Genitourinary: Negative for difficulty urinating, dysuria, flank pain and hematuria.  Musculoskeletal: Negative for arthralgias, back pain, gait problem, joint swelling and myalgias.  Skin: Negative for color change, pallor, rash and wound.  Neurological: Negative for dizziness, tremors, weakness and light-headedness.  Hematological: Negative for adenopathy. Does not bruise/bleed easily.  Psychiatric/Behavioral: Negative for agitation, behavioral problems, confusion, decreased concentration, dysphoric mood and sleep disturbance.       Objective:   Physical Exam Constitutional:      General: She is not in acute distress.    Appearance: Normal appearance. She is well-developed. She is not ill-appearing or diaphoretic.  HENT:     Head: Normocephalic and atraumatic.      Right Ear: Hearing and external ear normal.     Left Ear: Hearing  and external ear normal.     Nose: No nasal deformity or rhinorrhea.  Eyes:     General: No scleral icterus.    Conjunctiva/sclera: Conjunctivae normal.     Right eye: Right conjunctiva is not injected.     Left eye: Left conjunctiva is not injected.  Neck:     Vascular: No JVD.  Cardiovascular:     Rate and Rhythm: Normal rate and regular rhythm.     Heart sounds: S1 normal and S2 normal.  Pulmonary:     Effort: Pulmonary effort is normal. No respiratory distress.  Abdominal:     General: Bowel sounds are normal. There is no distension.     Palpations: Abdomen is soft.     Tenderness: There is no abdominal tenderness.  Musculoskeletal:        General: Normal range of motion.     Right shoulder: Normal.     Left shoulder: Normal.     Cervical back: Normal range of motion and neck supple.     Right hip: Normal.     Left hip: Normal.     Right knee: Normal.     Left knee: Normal.  Lymphadenopathy:     Head:     Right side of head: No submandibular, preauricular or posterior auricular adenopathy.      Left side of head: No submandibular, preauricular or posterior auricular adenopathy.     Cervical: No cervical adenopathy.     Right cervical: No superficial or deep cervical adenopathy.    Left cervical: No superficial or deep cervical adenopathy.  Skin:    General: Skin is warm and dry.     Coloration: Skin is not pale.     Findings: No abrasion, bruising, ecchymosis, erythema, lesion or rash.     Nails: There is no clubbing.  Neurological:     General: No focal deficit present.     Mental Status: She is alert and oriented to person, place, and time.     Sensory: No sensory deficit.     Coordination: Coordination normal.     Gait: Gait normal.  Psychiatric:        Attention and Perception: She is attentive.        Mood and Affect: Mood normal.        Speech: Speech normal.        Behavior: Behavior normal. Behavior is cooperative.        Thought Content: Thought content normal.        Judgment: Judgment normal.           Assessment & Plan:   Possible acute bacterial sinusitis superimposed on allergic sinusitis: I will give her a 10-day course of Augmentin  Encouraged treatment with nonsedating antihistamines and inhaled fluticasone  HIV disease: Had her meet with Deirdre Evener just to discuss the DO IT study  Has renewed her HIV medication assistance program.  She will need to renew again in January.  Obesity: Continue to try carbohydrate restrictions with perhaps enrollment in the study may show benefit to switching regimens if she is randomized to do so.  Hypertension continue current meds  COVID-19 infection resolved  Need for Pap smear: We are getting her club plugged in with [facility]

## 2020-04-05 NOTE — Patient Instructions (Signed)
Make appt with Rexene Alberts and visit Deirdre Evener today

## 2020-04-19 ENCOUNTER — Other Ambulatory Visit: Payer: Self-pay

## 2020-04-19 ENCOUNTER — Ambulatory Visit (INDEPENDENT_AMBULATORY_CARE_PROVIDER_SITE_OTHER): Payer: Self-pay | Admitting: Infectious Diseases

## 2020-04-19 ENCOUNTER — Encounter: Payer: Self-pay | Admitting: Infectious Diseases

## 2020-04-19 DIAGNOSIS — Z1231 Encounter for screening mammogram for malignant neoplasm of breast: Secondary | ICD-10-CM

## 2020-04-19 DIAGNOSIS — B20 Human immunodeficiency virus [HIV] disease: Secondary | ICD-10-CM

## 2020-04-19 DIAGNOSIS — R87810 Cervical high risk human papillomavirus (HPV) DNA test positive: Secondary | ICD-10-CM

## 2020-04-19 DIAGNOSIS — R87610 Atypical squamous cells of undetermined significance on cytologic smear of cervix (ASC-US): Secondary | ICD-10-CM

## 2020-04-19 DIAGNOSIS — Z124 Encounter for screening for malignant neoplasm of cervix: Secondary | ICD-10-CM

## 2020-04-19 NOTE — Patient Instructions (Signed)
Please call the [facility] to set up your mammogram.  [phone] [address], [address] [place], [place] [address]  We will have you fill out the mammogram scholarship assistance to help with cost

## 2020-04-19 NOTE — Progress Notes (Signed)
      Subjective:    Monique [name] is a 49 y.o. female here for an annual pelvic exam and pap smear.   Review of Systems: Current GYN complaints or concerns: none.  Patient denies any abdominal/pelvic pain, problems with bowel movements, urination, vaginal discharge or intercourse.   Past Medical History:  Diagnosis Date  . COVID-19 virus infection 11/23/2019  . Exertional chest pain 05/13/2017  . HIV (human immunodeficiency virus infection) (HCC)   . Hypertension   . 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 Lower extremity edema 01/12/2015  . Lung abscess (HCC)   . Morbid obesity (HCC)   . Pneumonia   . TB (tuberculosis)   . Tonsillitis   . Viral URI 09/16/2017  . Weight gain 10/27/2018    Gynecologic History: No obstetric history on file.  Patient's last menstrual period was 03/25/2020 (approximate). Contraception: condoms Last Pap: 2015. Results were: normal Anal Intercourse:  Last Mammogram: never Results were:  Objective:  Physical Exam  Constitutional: Well developed, well nourished, no acute distress. She is alert and oriented x3.  Pelvic: External genitalia is normal in appearance. The vagina is normal in appearance. The cervix is bulbous and easily visualized. No CMT, normal expected cervical mucus present. Bimanual exam reveals uterus that is felt to be normal size, shape, and contour. No adnexal masses or tenderness noted. Breasts: symmetrical in contour, shape and texture. No palpable masses/nodules. No nipple discharge.  Psych: She has a normal mood and affect.    Assessment:   Problem List Items Addressed This Visit      Unprioritized   Screening for cervical cancer - Primary    Normal pelvic exam. Normal bimanual exam.  Cervical brushing collected for cytology and HPV.  Discussed recommended screening interval for women living with HIV disease meant to be lifelong and at an interval of Q1-3 years pending results.  Acceptable to space out Q3y with 3 consecutively normal exams. Further recommendations for Monique [name] to follow today's results.  Results will be communicated to the patient via telephone call.        Relevant Orders   Cytology - PAP( [place])   Human immunodeficiency virus (HIV) disease (HCC)    Well controlled on current regimen - follow up with Dr. Daiva Eves as indicated.       Encounter for screening mammogram for malignant neoplasm of breast    She has never had mammogram for screening. No family history. Discussed current recommendations and will place order today and have her complete mammogram scholarship for funding.       Relevant Orders   MM Digital Screening        Rexene Alberts, MSN, NP-C [facility]  [email or web address] Pager: [phone] Office: [phone] [facility] Main Line: [phone]

## 2020-04-24 ENCOUNTER — Encounter: Payer: Self-pay | Admitting: Infectious Diseases

## 2020-04-24 DIAGNOSIS — Z124 Encounter for screening for malignant neoplasm of cervix: Secondary | ICD-10-CM | POA: Insufficient documentation

## 2020-04-24 DIAGNOSIS — Z1231 Encounter for screening mammogram for malignant neoplasm of breast: Secondary | ICD-10-CM | POA: Insufficient documentation

## 2020-04-24 NOTE — Assessment & Plan Note (Signed)
Well controlled on current regimen - follow up with Dr. Daiva Eves as indicated.

## 2020-04-24 NOTE — Assessment & Plan Note (Signed)
She has never had mammogram for screening. No family history. Discussed current recommendations and will place order today and have her complete mammogram scholarship for funding.

## 2020-04-24 NOTE — Assessment & Plan Note (Signed)
Normal pelvic exam. Normal bimanual exam.  Cervical brushing collected for cytology and HPV.  Discussed recommended screening interval for women living with HIV disease meant to be lifelong and at an interval of Q1-3 years pending results. Acceptable to space out Q3y with 3 consecutively normal exams. Further recommendations for Monique [name] to follow today's results.  Results will be communicated to the patient via telephone call.

## 2020-04-25 LAB — CYTOLOGY - PAP
Chlamydia: NEGATIVE
Comment: NEGATIVE
Comment: NEGATIVE
Comment: NEGATIVE
Comment: NORMAL
Diagnosis: UNDETERMINED — AB
HPV 16: NEGATIVE
HPV 18 / 45: NEGATIVE
High risk HPV: POSITIVE — AB
Neisseria Gonorrhea: NEGATIVE

## 2020-04-26 ENCOUNTER — Encounter: Payer: Self-pay | Admitting: Infectious Disease

## 2020-05-05 NOTE — Progress Notes (Signed)
Attempted to call patient with results. Voice box is full and not accepting new messages.

## 2020-05-05 NOTE — Progress Notes (Signed)
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 Many adults become exposed to this virus through sexual contact. To understand what these changes mean, we need to do another test called a colposcopy. I will put a referral in for gynecology and they should be contacting her.

## 2020-05-05 NOTE — Addendum Note (Signed)
Addended by: Blanchard Kelch on: 05/05/2020 12:07 PM   Modules accepted: Orders

## 2020-05-06 ENCOUNTER — Telehealth: Payer: Self-pay

## 2020-05-06 NOTE — Telephone Encounter (Signed)
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 Many adults become exposed to this virus through sexual contact. To understand what these changes mean, we need to do another test called a colposcopy. I will put a referral in for gynecology and they should be contacting her.

## 2020-05-06 NOTE — Telephone Encounter (Signed)
Called patient with results. Is aware of referral for OBGYN.  Monique [name], [place]

## 2020-05-30 ENCOUNTER — Telehealth: Payer: Self-pay

## 2020-05-30 NOTE — Telephone Encounter (Signed)
Patient called [facility] to make sure that this was a correct referral. RN assured her it was, that this is for further testing. She will call [facility] tomorrow to follow up. Andree Coss, RN

## 2020-05-30 NOTE — Telephone Encounter (Signed)
Attempted to contact patient regarding referral to [facility], received message"mailbox full, cannot accept any more messages.

## 2020-06-23 ENCOUNTER — Encounter (HOSPITAL_COMMUNITY): Payer: Self-pay

## 2020-06-23 ENCOUNTER — Ambulatory Visit (HOSPITAL_COMMUNITY)
Admission: EM | Admit: 2020-06-23 | Discharge: 2020-06-23 | Disposition: A | Discharge status: Home / Self Care | Payer: HRSA Program | Attending: Family Medicine | Admitting: Family Medicine

## 2020-06-23 ENCOUNTER — Other Ambulatory Visit: Payer: Self-pay

## 2020-06-23 DIAGNOSIS — Z20822 Contact with and (suspected) exposure to covid-19: Secondary | ICD-10-CM | POA: Insufficient documentation

## 2020-06-23 DIAGNOSIS — Z87891 Personal history of nicotine dependence: Secondary | ICD-10-CM | POA: Insufficient documentation

## 2020-06-23 DIAGNOSIS — Z79899 Other long term (current) drug therapy: Secondary | ICD-10-CM | POA: Insufficient documentation

## 2020-06-23 DIAGNOSIS — Z21 Asymptomatic human immunodeficiency virus [HIV] infection status: Secondary | ICD-10-CM | POA: Diagnosis not present

## 2020-06-23 DIAGNOSIS — Z8616 Personal history of COVID-19: Secondary | ICD-10-CM | POA: Insufficient documentation

## 2020-06-23 DIAGNOSIS — B349 Viral infection, unspecified: Secondary | ICD-10-CM | POA: Insufficient documentation

## 2020-06-23 NOTE — Discharge Instructions (Signed)
This is most likely viral.  You can take over-the-counter medicines as needed.  Tylenol and ibuprofen for body aches and chills. Follow up as needed for continued or worsening symptoms

## 2020-06-23 NOTE — ED Provider Notes (Signed)
[facility]    CSN: [id number] Arrival date & time: 06/23/20  7782      History   Chief Complaint Chief Complaint  Patient presents with  . Generalized Body Aches  . Chills    HPI Monique MAZER is a 49 y.o. female.   Patient is a 49 year old female presents today with generalized body aches, chills that started yesterday.  Symptoms have been constant.  Was exposed to her grandson with viral type symptoms.  Has been previously positive for Covid back in March and has been COVID vaccinated.  Has been using cough syrup to relieve discomfort.  No fever, cough, chest congestion, sore throat, ear pain, rhinorrhea, abdominal pain, nausea, vomiting or diarrhea     Past Medical History:  Diagnosis Date  . COVID-19 virus infection 11/23/2019  . Exertional chest pain 05/13/2017  . HIV (human immunodeficiency virus infection) (HCC)   . Hypertension   . 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 Lower extremity edema 01/12/2015  . Lung abscess (HCC)   . Morbid obesity (HCC)   . Pneumonia   . TB (tuberculosis)   . Tonsillitis   . Viral URI 09/16/2017  . Weight gain 10/27/2018    Patient Active Problem List   Diagnosis Date Noted  . Screening for cervical cancer 04/24/2020  . Encounter for screening mammogram for malignant neoplasm of breast 04/24/2020  . Morbid obesity (HCC)   . Hypertension   . Sinusitis 09/10/2019  . Seasonal allergies 01/26/2019  . Weight gain 10/27/2018  . Myalgia 07/04/2018  . Healthcare maintenance 05/19/2018  . Exertional chest pain 05/13/2017  . Nausea 02/06/2017  . Obese 08/10/2015  . Essential hypertension 01/12/2015  . 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 Human immunodeficiency virus (HIV) disease (HCC) 04/10/2013    Past Surgical History:  Procedure Laterality Date  . CESAREAN SECTION    . VIDEO BRONCHOSCOPY Bilateral 04/13/2013   Procedure: VIDEO  BRONCHOSCOPY WITHOUT FLUORO;  Surgeon: Kalman Shan, MD;  Location: [facility] ENDOSCOPY;  Service: Cardiopulmonary;  Laterality: Bilateral;    OB History   No obstetric history on file.      Home Medications    Prior to Admission medications   Medication Sig Start Date End Date Taking? Authorizing Provider  acetaminophen (TYLENOL) 500 MG tablet Take 2 tablets (1,000 mg total) by mouth every 8 (eight) hours as needed for mild pain or fever. 07/04/18   Dorrell, Cathleen Corti, MD  albuterol (VENTOLIN HFA) 108 (90 Base) MCG/ACT inhaler Inhale 1-2 puffs into the lungs every 6 (six) hours as needed for wheezing or shortness of breath. 11/23/19   Daiva Eves, Lisette Grinder, MD  amLODipine (NORVASC) 10 MG tablet Take 1 tablet (10 mg total) by mouth daily. 11/23/19   Randall Hiss, MD  bictegravir-emtricitabine-tenofovir AF (BIKTARVY) 50-200-25 MG TABS tablet Take 1 tablet by mouth daily. 11/23/19   Randall Hiss, MD  cetirizine (ZYRTEC ALLERGY) 10 MG tablet Take 1 tablet (10 mg total) by mouth daily. 12/24/19 12/23/20  Bloomfield, Carley D, DO  fluticasone (FLONASE) 50 MCG/ACT nasal spray Place 2 sprays into both nostrils daily. 11/23/19 11/22/20  Randall Hiss, MD  hydrochlorothiazide (HYDRODIURIL) 25 MG tablet Take 1 tablet (25 mg total) by mouth daily. 11/23/19   Randall Hiss, MD  loratadine (CLARITIN) 10 MG tablet Take 1 tablet (10 mg total) by mouth daily. 11/25/18   Wieters, Junius Creamer, PA-C  Family History Family History  Problem Relation Age of Onset  . Diabetes Sister   . Hypertension Sister   . Hypertension Mother   . Hypertension Father     Social History Social History   Tobacco Use  . Smoking status: Former Smoker    Quit date: 12/22/2013    Years since quitting: 6.5  . Smokeless tobacco: Never Used  Vaping Use  . Vaping Use: Never used  Substance Use Topics  . Alcohol use: No    Alcohol/week: 0.0 standard drinks  . Drug use: No     Allergies   Patient has  no known allergies.   Review of Systems Review of Systems   Physical Exam Triage Vital Signs ED Triage Vitals  Enc Vitals Group     BP 06/23/20 1027 (!) 163/79     Pulse Rate 06/23/20 1027 99     Resp 06/23/20 1027 (!) 21     Temp 06/23/20 1027 98.9 F (37.2 C)     Temp Source 06/23/20 1027 Oral     SpO2 06/23/20 1027 96 %     Weight --      Height --      Head Circumference --      Peak Flow --      Pain Score 06/23/20 1026 0     Pain Loc --      Pain Edu? --      Excl. in GC? --    No data found.  Updated Vital Signs BP (!) 163/79 (BP Location: Right Arm)   Pulse 99   Temp 98.9 F (37.2 C) (Oral)   Resp (!) 21   LMP 05/30/2020   SpO2 96%   Visual Acuity Right Eye Distance:   Left Eye Distance:   Bilateral Distance:    Right Eye Near:   Left Eye Near:    Bilateral Near:     Physical Exam Vitals and nursing note reviewed.  Constitutional:      General: She is not in acute distress.    Appearance: Normal appearance. She is not ill-appearing, toxic-appearing or diaphoretic.  HENT:     Head: Normocephalic.     Nose: Nose normal.  Eyes:     Conjunctiva/sclera: Conjunctivae normal.  Pulmonary:     Effort: Pulmonary effort is normal.  Musculoskeletal:        General: Normal range of motion.     Cervical back: Normal range of motion.  Skin:    General: Skin is warm and dry.     Findings: No rash.  Neurological:     Mental Status: She is alert.  Psychiatric:        Mood and Affect: Mood normal.      UC Treatments / Results  Labs (all labs ordered are listed, but only abnormal results are displayed) Labs Reviewed  SARS CORONAVIRUS 2 (TAT 6-24 HRS)    EKG   Radiology No results found.  Procedures Procedures (including critical care time)  Medications Ordered in UC Medications - No data to display  Initial Impression / Assessment and Plan / UC Course  I have reviewed the triage vital signs and the nursing notes.  Pertinent labs &  imaging results that were available during my care of the patient were reviewed by me and considered in my medical decision making (see chart for details).     Viral illness Recommend over-the-counter medicines as needed. Covid swab pending Follow up as needed for continued or worsening symptoms  Final Clinical Impressions(s) / UC Diagnoses   Final diagnoses:  Viral illness     Discharge Instructions     This is most likely viral.  You can take over-the-counter medicines as needed.  Tylenol and ibuprofen for body aches and chills. Follow up as needed for continued or worsening symptoms    ED Prescriptions    None     PDMP not reviewed this encounter.   Janace Aris, NP 06/23/20 1139

## 2020-06-23 NOTE — ED Triage Notes (Signed)
Pt is here with body aches and chill that started yesterday, pt has taken cough syrup to relieve discomfort.

## 2020-06-24 LAB — SARS CORONAVIRUS 2 (TAT 6-24 HRS): SARS Coronavirus 2: NEGATIVE

## 2020-07-27 ENCOUNTER — Other Ambulatory Visit: Payer: Self-pay

## 2020-07-27 ENCOUNTER — Ambulatory Visit (HOSPITAL_COMMUNITY)
Admission: EM | Admit: 2020-07-27 | Discharge: 2020-07-27 | Disposition: A | Discharge status: Home / Self Care | Payer: HRSA Program | Attending: Family Medicine | Admitting: Family Medicine

## 2020-07-27 ENCOUNTER — Encounter (HOSPITAL_COMMUNITY): Payer: Self-pay

## 2020-07-27 DIAGNOSIS — Z20822 Contact with and (suspected) exposure to covid-19: Secondary | ICD-10-CM | POA: Diagnosis not present

## 2020-07-27 DIAGNOSIS — I10 Essential (primary) hypertension: Secondary | ICD-10-CM | POA: Diagnosis present

## 2020-07-27 DIAGNOSIS — J069 Acute upper respiratory infection, unspecified: Secondary | ICD-10-CM | POA: Diagnosis not present

## 2020-07-27 LAB — SARS CORONAVIRUS 2 (TAT 6-24 HRS): SARS Coronavirus 2: NEGATIVE

## 2020-07-27 MED ORDER — BENZONATATE 100 MG PO CAPS
ORAL_CAPSULE | ORAL | 0 refills | Status: DC
Start: 2020-07-27 — End: 2021-09-12

## 2020-07-27 NOTE — ED Provider Notes (Signed)
[facility]   [id number] 07/27/20 Arrival Time: 1301  ASSESSMENT & PLAN:  1. Viral URI with cough   2. Elevated blood pressure reading in office with diagnosis of hypertension     COVID-19 testing sent. See letter/work note on file for self-isolation guidelines. OTC symptom care as needed.  Meds ordered this encounter  Medications  . benzonatate (TESSALON) 100 MG capsule    Sig: Take 1 capsule by mouth every 8 (eight) hours for cough.    Dispense:  21 capsule    Refill:  0     Follow-up Information    Elige Radon, MD.   Specialty: Internal Medicine Why: As needed. Contact information: [address] [place] [address] [phone]               Reviewed expectations re: course of current medical issues. Questions answered. Outlined signs and symptoms indicating need for more acute intervention. Understanding verbalized. After Visit Summary given.   SUBJECTIVE: History from: patient. Monique [name] is a 49 y.o. female who reports productive cough, nasal congestion, sore throat, body aches; abrupt onset yesterday. Denies: fever and difficulty breathing. Normal PO intake without n/v/d.    OBJECTIVE:  Vitals:   07/27/20 1429  BP: (!) 148/101  Pulse: 92  Resp: 20  Temp: 98.6 F (37 C)  TempSrc: Oral  SpO2: 99%    General appearance: alert; no distress Eyes: PERRLA; EOMI; conjunctiva normal HENT: [place]; AT; with nasal congestion; throat irritation Neck: supple without LAD Lungs: speaks full sentences without difficulty; unlabored; dry cough Extremities: no edema Skin: warm and dry Neurologic: normal gait Psychological: alert and cooperative; normal mood and affect  Labs:  Labs Reviewed  SARS CORONAVIRUS 2 (TAT 6-24 HRS)     No Known Allergies  Past Medical History:  Diagnosis Date  . COVID-19 virus infection 11/23/2019  . Exertional chest pain 05/13/2017  . HIV (human immunodeficiency virus infection) (HCC)     . Hypertension   . 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 Lower extremity edema 01/12/2015  . Lung abscess (HCC)   . Morbid obesity (HCC)   . Pneumonia   . TB (tuberculosis)   . Tonsillitis   . Viral URI 09/16/2017  . Weight gain 10/27/2018   Social History   Socioeconomic History  . Marital status: Single    Spouse name: Not on file  . Number of children: Not on file  . Years of education: Not on file  . Highest education level: Not on file  Occupational History  . Not on file  Tobacco Use  . Smoking status: Former Smoker    Quit date: 12/22/2013    Years since quitting: 6.6  . Smokeless tobacco: Never Used  Vaping Use  . Vaping Use: Never used  Substance and Sexual Activity  . Alcohol use: No    Alcohol/week: 0.0 standard drinks  . Drug use: No  . Sexual activity: Not Currently    Partners: Male    Birth control/protection: None  Other Topics Concern  . Not on file  Social History Narrative  . Not on file   Social Determinants of Health   Financial Resource Strain:   . Difficulty of Paying Living Expenses: Not on file  Food Insecurity:   . Worried About Programme researcher, broadcasting/film/video in the Last Year: Not on file  . Ran Out of Food in the Last Year: Not on file  Transportation Needs:   .  Lack of Transportation (Medical): Not on file  . Lack of Transportation (Non-Medical): Not on file  Physical Activity:   . Days of Exercise per Week: Not on file  . Minutes of Exercise per Session: Not on file  Stress:   . Feeling of Stress : Not on file  Social Connections:   . Frequency of Communication with Friends and Family: Not on file  . Frequency of Social Gatherings with Friends and Family: Not on file  . Attends Religious Services: Not on file  . Active Member of Clubs or Organizations: Not on file  . Attends Banker Meetings: Not on file  . Marital Status: Not on file  Intimate Partner Violence:   . Fear of Current or  Ex-Partner: Not on file  . Emotionally Abused: Not on file  . Physically Abused: Not on file  . Sexually Abused: Not on file   Family History  Problem Relation Age of Onset  . Diabetes Sister   . Hypertension Sister   . Hypertension Mother   . Hypertension Father    Past Surgical History:  Procedure Laterality Date  . CESAREAN SECTION    . VIDEO BRONCHOSCOPY Bilateral 04/13/2013   Procedure: VIDEO BRONCHOSCOPY WITHOUT FLUORO;  Surgeon: Kalman Shan, MD;  Location: [facility] ENDOSCOPY;  Service: Cardiopulmonary;  Laterality: Bilateral;     Mardella Layman, MD 07/27/20 1610

## 2020-07-27 NOTE — ED Triage Notes (Signed)
Pt presents with productive cough, nasal congestion, sore throat, and generalized body aches since yesterday.

## 2020-07-27 NOTE — Discharge Instructions (Addendum)
You have been tested for COVID-19 today. If your test returns positive, you will receive a phone call from [facility] regarding your results. Negative test results are not called. Both positive and negative results area always visible on MyChart. If you do not have a MyChart account, sign up instructions are provided in your discharge papers. Please do not hesitate to contact us should you have questions or concerns.   Your blood pressure was noted to be elevated during your visit today. If you are currently taking medication for high blood pressure, please ensure you are taking this as directed. If you do not have a history of high blood pressure and your blood pressure remains persistently elevated, you may need to begin taking a medication at some point. You may return here within the next few days to recheck if unable to see your primary care provider or if do not have a one.  BP (!) 148/101 (BP Location: Right Arm)   Pulse 92   Temp 98.6 F (37 C) (Oral)   Resp 20   LMP 07/23/2020   SpO2 99%

## 2020-08-22 ENCOUNTER — Other Ambulatory Visit: Payer: Self-pay

## 2020-09-05 ENCOUNTER — Encounter: Payer: Self-pay | Admitting: Infectious Disease

## 2020-09-06 ENCOUNTER — Encounter: Payer: Self-pay | Admitting: Infectious Disease

## 2020-09-06 ENCOUNTER — Ambulatory Visit: Payer: Self-pay

## 2020-09-06 ENCOUNTER — Ambulatory Visit (INDEPENDENT_AMBULATORY_CARE_PROVIDER_SITE_OTHER): Payer: Self-pay | Admitting: Infectious Disease

## 2020-09-06 ENCOUNTER — Other Ambulatory Visit: Payer: Self-pay

## 2020-09-06 VITALS — BP 155/100 | HR 91 | Temp 98.6°F | Wt 248.0 lb

## 2020-09-06 DIAGNOSIS — R635 Abnormal weight gain: Secondary | ICD-10-CM

## 2020-09-06 DIAGNOSIS — B20 Human immunodeficiency virus [HIV] disease: Secondary | ICD-10-CM

## 2020-09-06 DIAGNOSIS — I10 Essential (primary) hypertension: Secondary | ICD-10-CM

## 2020-09-06 DIAGNOSIS — Z124 Encounter for screening for malignant neoplasm of cervix: Secondary | ICD-10-CM

## 2020-09-06 NOTE — Progress Notes (Signed)
Subjective:  Chief complaint persistent sinus congestion  Patient ID: Monique [name], female    DOB: 1971/05/29, 50 y.o.   MRN: [id number]   Monique [name] is a 50 year old African-American lady who I am known for many years now is initially diagnosed with HIV and a CD4 nadir in the 70s.  She rapidly suppressed her virus and restored her T cells above 400.  She did gain quite a bit of weight after initially losing weight with her HIV infection and illness.  She says that she went down to 150 pounds.  Since then she has gained weight well above 200, go up to 204 pounds within the first 6 months of her treatment and then  now up to 30 pounds.  She was initially treated with Triumeq and then more recently with Biktarvy.  I think she would be a  candidate for an ACT G study looking at switching to DOravirine + Truvada vs Doravirine + Descovy vs staying on current regimen that she would be an excellent candidate for and we discussed this study.  Bob did have COVID-19 infection past summer   Monique [name] has had 2 doses of Pfizer vaccine and needs a booster.  She did well with her first dose but with the second dose felt sick for nearly a week.  I counseled this is not uncommon for the second dose and there is variable reaction to the third dose Senthil having no side effects and some pill having side effects after the dose.  She will go get that vaccine today.  She will also get flu vaccine but she wants to delay it till after she has gotten her booster shot for COVID.  She does need labs today she remains on Biktarvy  She is concerned about her weight gain and she is interested in the ACT G study  Past Medical History:  Diagnosis Date  . COVID-19 virus infection 11/23/2019  . Exertional chest pain 05/13/2017  . HIV (human immunodeficiency virus infection) (HCC)   . Hypertension   . 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 Lower extremity edema 01/12/2015  .  Lung abscess (HCC)   . Morbid obesity (HCC)   . Pneumonia   . TB (tuberculosis)   . Tonsillitis   . Viral URI 09/16/2017  . Weight gain 10/27/2018    Past Surgical History:  Procedure Laterality Date  . CESAREAN SECTION    . VIDEO BRONCHOSCOPY Bilateral 04/13/2013   Procedure: VIDEO BRONCHOSCOPY WITHOUT FLUORO;  Surgeon: Kalman Shan, MD;  Location: [facility] ENDOSCOPY;  Service: Cardiopulmonary;  Laterality: Bilateral;    Family History  Problem Relation Age of Onset  . Diabetes Sister   . Hypertension Sister   . Hypertension Mother   . Hypertension Father       Social History   Socioeconomic History  . Marital status: Single    Spouse name: Not on file  . Number of children: Not on file  . Years of education: Not on file  . Highest education level: Not on file  Occupational History  . Not on file  Tobacco Use  . Smoking status: Former Smoker    Quit date: 12/22/2013    Years since quitting: 6.7  . Smokeless tobacco: Never Used  Vaping Use  . Vaping Use: Never used  Substance and Sexual Activity  . Alcohol use: No    Alcohol/week: 0.0 standard drinks  . Drug use: No  . Sexual activity: Not Currently  Partners: Male    Birth control/protection: None  Other Topics Concern  . Not on file  Social History Narrative  . Not on file   Social Determinants of Health   Financial Resource Strain: Not on file  Food Insecurity: Not on file  Transportation Needs: Not on file  Physical Activity: Not on file  Stress: Not on file  Social Connections: Not on file    No Known Allergies   Current Outpatient Medications:  .  acetaminophen (TYLENOL) 500 MG tablet, Take 2 tablets (1,000 mg total) by mouth every 8 (eight) hours as needed for mild pain or fever., Disp: 30 tablet, Rfl: 0 .  albuterol (VENTOLIN HFA) 108 (90 Base) MCG/ACT inhaler, Inhale 1-2 puffs into the lungs every 6 (six) hours as needed for wheezing or shortness of breath., Disp: 18 g, Rfl: 5 .  amLODipine  (NORVASC) 10 MG tablet, Take 1 tablet (10 mg total) by mouth daily., Disp: 30 tablet, Rfl: 11 .  benzonatate (TESSALON) 100 MG capsule, Take 1 capsule by mouth every 8 (eight) hours for cough., Disp: 21 capsule, Rfl: 0 .  bictegravir-emtricitabine-tenofovir AF (BIKTARVY) 50-200-25 MG TABS tablet, Take 1 tablet by mouth daily., Disp: 30 tablet, Rfl: 11 .  cetirizine (ZYRTEC ALLERGY) 10 MG tablet, Take 1 tablet (10 mg total) by mouth daily., Disp: 30 tablet, Rfl: 2 .  fluticasone (FLONASE) 50 MCG/ACT nasal spray, Place 2 sprays into both nostrils daily., Disp: 1 g, Rfl: 5 .  hydrochlorothiazide (HYDRODIURIL) 25 MG tablet, Take 1 tablet (25 mg total) by mouth daily., Disp: 30 tablet, Rfl: 11 .  loratadine (CLARITIN) 10 MG tablet, Take 1 tablet (10 mg total) by mouth daily., Disp: 15 tablet, Rfl: 0   Review of Systems  Constitutional: Negative for activity change, appetite change, chills, diaphoresis, fatigue, fever and unexpected weight change.  HENT: Negative for congestion, facial swelling, rhinorrhea, sinus pressure, sneezing, sore throat and trouble swallowing.   Eyes: Negative for photophobia and visual disturbance.  Respiratory: Negative for cough, chest tightness, shortness of breath, wheezing and stridor.   Cardiovascular: Negative for chest pain, palpitations and leg swelling.  Gastrointestinal: Negative for abdominal distention, abdominal pain, anal bleeding, blood in stool, constipation, diarrhea, nausea and vomiting.  Genitourinary: Negative for difficulty urinating, dysuria, flank pain and hematuria.  Musculoskeletal: Negative for arthralgias, back pain, gait problem, joint swelling and myalgias.  Skin: Negative for color change, pallor, rash and wound.  Neurological: Negative for dizziness, tremors, weakness and light-headedness.  Hematological: Negative for adenopathy. Does not bruise/bleed easily.  Psychiatric/Behavioral: Negative for agitation, behavioral problems, confusion,  decreased concentration, dysphoric mood and sleep disturbance.       Objective:   Physical Exam Constitutional:      General: She is not in acute distress.    Appearance: Normal appearance. She is well-developed. She is not ill-appearing or diaphoretic.  HENT:     Head: Normocephalic and atraumatic.     Right Ear: Hearing and external ear normal.     Left Ear: Hearing and external ear normal.     Nose: No nasal deformity or rhinorrhea.  Eyes:     General: No scleral icterus.    Conjunctiva/sclera: Conjunctivae normal.     Right eye: Right conjunctiva is not injected.     Left eye: Left conjunctiva is not injected.  Neck:     Vascular: No JVD.  Cardiovascular:     Rate and Rhythm: Normal rate and regular rhythm.     Heart sounds: S1 normal  and S2 normal.  Pulmonary:     Effort: Pulmonary effort is normal. No respiratory distress.  Abdominal:     General: Bowel sounds are normal. There is no distension.     Palpations: Abdomen is soft.     Tenderness: There is no abdominal tenderness.  Musculoskeletal:        General: Normal range of motion.     Right shoulder: Normal.     Left shoulder: Normal.     Cervical back: Normal range of motion and neck supple.     Right hip: Normal.     Left hip: Normal.     Right knee: Normal.     Left knee: Normal.  Lymphadenopathy:     Head:     Right side of head: No submandibular, preauricular or posterior auricular adenopathy.     Left side of head: No submandibular, preauricular or posterior auricular adenopathy.     Cervical: No cervical adenopathy.     Right cervical: No superficial or deep cervical adenopathy.    Left cervical: No superficial or deep cervical adenopathy.  Skin:    General: Skin is warm and dry.     Coloration: Skin is not pale.     Findings: No abrasion, bruising, ecchymosis, erythema, lesion or rash.     Nails: There is no clubbing.  Neurological:     General: No focal deficit present.     Mental Status: She is  alert and oriented to person, place, and time.     Sensory: No sensory deficit.     Coordination: Coordination normal.     Gait: Gait normal.  Psychiatric:        Attention and Perception: She is attentive.        Mood and Affect: Mood normal.        Speech: Speech normal.        Behavior: Behavior normal. Behavior is cooperative.        Thought Content: Thought content normal.        Judgment: Judgment normal.           Assessment & Plan:    HIV disease: Discussed her with Deirdre Evener again re ACTG study. In interim continue BIKTARVY and renew HIV medication assistance program.   Obesity: As mentioned we will see if we can enroll her into the ACT G study in the interim can try dietary interventions..  Hypertension continue current meds  Atypical cells on Pap smear: not sure if she had colposcopy she was referred

## 2020-09-07 LAB — T-HELPER CELL (CD4) - (RCID CLINIC ONLY)
CD4 % Helper T Cell: 36 % (ref 33–65)
CD4 T Cell Abs: 532 /uL (ref 400–1790)

## 2020-09-08 LAB — URINE CYTOLOGY ANCILLARY ONLY
Chlamydia: NEGATIVE
Comment: NEGATIVE
Comment: NORMAL
Neisseria Gonorrhea: NEGATIVE

## 2020-09-12 LAB — CBC WITH DIFFERENTIAL/PLATELET
Absolute Monocytes: 833 cells/uL (ref 200–950)
Basophils Absolute: 43 cells/uL (ref 0–200)
Basophils Relative: 0.5 %
Eosinophils Absolute: 162 cells/uL (ref 15–500)
Eosinophils Relative: 1.9 %
HCT: 38.2 % (ref 35.0–45.0)
Hemoglobin: 12.4 g/dL (ref 11.7–15.5)
Lymphs Abs: 1726 cells/uL (ref 850–3900)
MCH: 27.1 pg (ref 27.0–33.0)
MCHC: 32.5 g/dL (ref 32.0–36.0)
MCV: 83.4 fL (ref 80.0–100.0)
MPV: 11.3 fL (ref 7.5–12.5)
Monocytes Relative: 9.8 %
Neutro Abs: 5738 cells/uL (ref 1500–7800)
Neutrophils Relative %: 67.5 %
Platelets: 285 10*3/uL (ref 140–400)
RBC: 4.58 10*6/uL (ref 3.80–5.10)
RDW: 15.2 % — ABNORMAL HIGH (ref 11.0–15.0)
Total Lymphocyte: 20.3 %
WBC: 8.5 10*3/uL (ref 3.8–10.8)

## 2020-09-12 LAB — COMPLETE METABOLIC PANEL WITH GFR
AG Ratio: 1.2 (calc) (ref 1.0–2.5)
ALT: 23 U/L (ref 6–29)
AST: 19 U/L (ref 10–35)
Albumin: 4 g/dL (ref 3.6–5.1)
Alkaline phosphatase (APISO): 64 U/L (ref 37–153)
BUN: 15 mg/dL (ref 7–25)
CO2: 26 mmol/L (ref 20–32)
Calcium: 9.3 mg/dL (ref 8.6–10.4)
Chloride: 104 mmol/L (ref 98–110)
Creat: 0.79 mg/dL (ref 0.50–1.05)
GFR, Est African American: 101 mL/min/{1.73_m2} (ref >=60)
GFR, Est Non African American: 87 mL/min/{1.73_m2} (ref >=60)
Globulin: 3.3 g/dL (calc) (ref 1.9–3.7)
Glucose, Bld: 92 mg/dL (ref 65–99)
Potassium: 4.1 mmol/L (ref 3.5–5.3)
Sodium: 137 mmol/L (ref 135–146)
Total Bilirubin: 0.3 mg/dL (ref 0.2–1.2)
Total Protein: 7.3 g/dL (ref 6.1–8.1)

## 2020-09-12 LAB — HIV-1 RNA QUANT-NO REFLEX-BLD
HIV 1 RNA Quant: <20 Copies/mL — ABNORMAL HIGH
HIV-1 RNA Quant, Log: <1.3 Log cps/mL — ABNORMAL HIGH

## 2020-09-12 LAB — LIPID PANEL
Cholesterol: 214 mg/dL — ABNORMAL HIGH (ref <200)
HDL: 41 mg/dL — ABNORMAL LOW (ref >=50)
LDL Cholesterol (Calc): 149 mg/dL (calc) — ABNORMAL HIGH
Non-HDL Cholesterol (Calc): 173 mg/dL (calc) — ABNORMAL HIGH (ref <130)
Total CHOL/HDL Ratio: 5.2 (calc) — ABNORMAL HIGH (ref <5.0)
Triglycerides: 119 mg/dL (ref <150)

## 2020-09-12 LAB — RPR: RPR Ser Ql: NONREACTIVE

## 2020-09-21 ENCOUNTER — Encounter: Payer: Self-pay | Admitting: Infectious Disease

## 2020-10-07 ENCOUNTER — Ambulatory Visit (HOSPITAL_COMMUNITY)
Admission: EM | Admit: 2020-10-07 | Discharge: 2020-10-07 | Disposition: A | Discharge status: Home / Self Care | Payer: Self-pay | Attending: Emergency Medicine | Admitting: Emergency Medicine

## 2020-10-07 ENCOUNTER — Encounter (HOSPITAL_COMMUNITY): Payer: Self-pay | Admitting: Emergency Medicine

## 2020-10-07 ENCOUNTER — Other Ambulatory Visit: Payer: Self-pay

## 2020-10-07 DIAGNOSIS — R1030 Lower abdominal pain, unspecified: Secondary | ICD-10-CM

## 2020-10-07 DIAGNOSIS — I10 Essential (primary) hypertension: Secondary | ICD-10-CM | POA: Insufficient documentation

## 2020-10-07 DIAGNOSIS — Z87891 Personal history of nicotine dependence: Secondary | ICD-10-CM | POA: Insufficient documentation

## 2020-10-07 DIAGNOSIS — M545 Low back pain, unspecified: Secondary | ICD-10-CM

## 2020-10-07 DIAGNOSIS — B349 Viral infection, unspecified: Secondary | ICD-10-CM

## 2020-10-07 DIAGNOSIS — Z79899 Other long term (current) drug therapy: Secondary | ICD-10-CM | POA: Insufficient documentation

## 2020-10-07 DIAGNOSIS — B20 Human immunodeficiency virus [HIV] disease: Secondary | ICD-10-CM | POA: Insufficient documentation

## 2020-10-07 DIAGNOSIS — Z8611 Personal history of tuberculosis: Secondary | ICD-10-CM | POA: Insufficient documentation

## 2020-10-07 DIAGNOSIS — R1084 Generalized abdominal pain: Secondary | ICD-10-CM

## 2020-10-07 DIAGNOSIS — Z8616 Personal history of COVID-19: Secondary | ICD-10-CM | POA: Insufficient documentation

## 2020-10-07 DIAGNOSIS — Z20822 Contact with and (suspected) exposure to covid-19: Secondary | ICD-10-CM | POA: Insufficient documentation

## 2020-10-07 DIAGNOSIS — E669 Obesity, unspecified: Secondary | ICD-10-CM | POA: Insufficient documentation

## 2020-10-07 DIAGNOSIS — M25561 Pain in right knee: Secondary | ICD-10-CM | POA: Insufficient documentation

## 2020-10-07 DIAGNOSIS — R030 Elevated blood-pressure reading, without diagnosis of hypertension: Secondary | ICD-10-CM

## 2020-10-07 LAB — POCT URINALYSIS DIPSTICK, ED / UC
Bilirubin Urine: NEGATIVE
Glucose, UA: NEGATIVE mg/dL
Hgb urine dipstick: NEGATIVE
Ketones, ur: NEGATIVE mg/dL
Nitrite: NEGATIVE
Protein, ur: NEGATIVE mg/dL
Specific Gravity, Urine: 1.025 (ref 1.005–1.030)
Urobilinogen, UA: 0.2 mg/dL (ref 0.0–1.0)
pH: 5.5 (ref 5.0–8.0)

## 2020-10-07 NOTE — ED Provider Notes (Signed)
[facility]    CSN: [id number] Arrival date & time: 10/07/20  1044      History   Chief Complaint Chief Complaint  Patient presents with  . Abdominal Pain  . Knee Pain    HPI Monique [name] is a 50 y.o. female.   Patient presents with chills, cough, abdominal pain, low back pain, right knee pain x5 days.  She also would like to be checked for a UTI and COVID.  She denies fever, rash, earache, sore throat, shortness of breath, vomiting, diarrhea, or other symptoms.  No falls or injury to her knee.  No treatments attempted at home.  Her medical history includes HIV, tuberculosis, hypertension, obesity, COVID in March 2021.  The history is provided by the patient and medical records.    Past Medical History:  Diagnosis Date  . COVID-19 virus infection 11/23/2019  . Exertional chest pain 05/13/2017  . HIV (human immunodeficiency virus infection) (HCC)   . Hypertension   . 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 Lower extremity edema 01/12/2015  . Lung abscess (HCC)   . Morbid obesity (HCC)   . Pneumonia   . TB (tuberculosis)   . Tonsillitis   . Viral URI 09/16/2017  . Weight gain 10/27/2018    Patient Active Problem List   Diagnosis Date Noted  . Screening for cervical cancer 04/24/2020  . Encounter for screening mammogram for malignant neoplasm of breast 04/24/2020  . Morbid obesity (HCC)   . Hypertension   . Sinusitis 09/10/2019  . Seasonal allergies 01/26/2019  . Weight gain 10/27/2018  . Myalgia 07/04/2018  . Healthcare maintenance 05/19/2018  . Exertional chest pain 05/13/2017  . Nausea 02/06/2017  . Obese 08/10/2015  . Essential hypertension 01/12/2015  . 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 Human immunodeficiency virus (HIV) disease (HCC) 04/10/2013    Past Surgical History:  Procedure Laterality Date  . CESAREAN SECTION    . VIDEO BRONCHOSCOPY Bilateral  04/13/2013   Procedure: VIDEO BRONCHOSCOPY WITHOUT FLUORO;  Surgeon: Kalman Shan, MD;  Location: [facility] ENDOSCOPY;  Service: Cardiopulmonary;  Laterality: Bilateral;    OB History   No obstetric history on file.      Home Medications    Prior to Admission medications   Medication Sig Start Date End Date Taking? Authorizing Provider  acetaminophen (TYLENOL) 500 MG tablet Take 2 tablets (1,000 mg total) by mouth every 8 (eight) hours as needed for mild pain or fever. 07/04/18   Dorrell, Cathleen Corti, MD  albuterol (VENTOLIN HFA) 108 (90 Base) MCG/ACT inhaler Inhale 1-2 puffs into the lungs every 6 (six) hours as needed for wheezing or shortness of breath. 11/23/19   Daiva Eves, Lisette Grinder, MD  amLODipine (NORVASC) 10 MG tablet Take 1 tablet (10 mg total) by mouth daily. 11/23/19   Randall Hiss, MD  benzonatate (TESSALON) 100 MG capsule Take 1 capsule by mouth every 8 (eight) hours for cough. 07/27/20   Mardella Layman, MD  bictegravir-emtricitabine-tenofovir AF (BIKTARVY) 50-200-25 MG TABS tablet Take 1 tablet by mouth daily. 11/23/19   Randall Hiss, MD  cetirizine (ZYRTEC ALLERGY) 10 MG tablet Take 1 tablet (10 mg total) by mouth daily. 12/24/19 12/23/20  Bloomfield, Carley D, DO  fluticasone (FLONASE) 50 MCG/ACT nasal spray Place 2 sprays into both nostrils daily. 11/23/19 11/22/20  Randall Hiss, MD  hydrochlorothiazide (HYDRODIURIL) 25 MG tablet Take 1 tablet (25 mg total)  by mouth daily. 11/23/19   Randall Hiss, MD  loratadine (CLARITIN) 10 MG tablet Take 1 tablet (10 mg total) by mouth daily. 11/25/18   Wieters, Junius Creamer, PA-C    Family History Family History  Problem Relation Age of Onset  . Diabetes Sister   . Hypertension Sister   . Hypertension Mother   . Hypertension Father     Social History Social History   Tobacco Use  . Smoking status: Former Smoker    Quit date: 12/22/2013    Years since quitting: 6.7  . Smokeless tobacco: Never Used  Vaping Use  .  Vaping Use: Never used  Substance Use Topics  . Alcohol use: No    Alcohol/week: 0.0 standard drinks  . Drug use: No     Allergies   Patient has no known allergies.   Review of Systems Review of Systems  Constitutional: Positive for chills. Negative for fever.  HENT: Negative for ear pain and sore throat.   Eyes: Negative for pain and visual disturbance.  Respiratory: Positive for cough. Negative for shortness of breath.   Cardiovascular: Negative for chest pain and palpitations.  Gastrointestinal: Positive for abdominal pain. Negative for diarrhea, nausea and vomiting.  Genitourinary: Negative for dysuria and hematuria.  Musculoskeletal: Positive for arthralgias and back pain.  Skin: Negative for color change and rash.  Neurological: Negative for seizures and syncope.  All other systems reviewed and are negative.    Physical Exam Triage Vital Signs ED Triage Vitals  Enc Vitals Group     BP 10/07/20 1146 (!) 149/98     Pulse Rate 10/07/20 1146 84     Resp 10/07/20 1146 18     Temp 10/07/20 1146 98.3 F (36.8 C)     Temp Source 10/07/20 1146 Oral     SpO2 10/07/20 1146 98 %     Weight --      Height --      Head Circumference --      Peak Flow --      Pain Score 10/07/20 1145 8     Pain Loc --      Pain Edu? --      Excl. in GC? --    No data found.  Updated Vital Signs BP (!) 149/98 (BP Location: Right Arm)   Pulse 84   Temp 98.3 F (36.8 C) (Oral)   Resp 18   SpO2 98%   Visual Acuity Right Eye Distance:   Left Eye Distance:   Bilateral Distance:    Right Eye Near:   Left Eye Near:    Bilateral Near:     Physical Exam Vitals and nursing note reviewed.  Constitutional:      General: She is not in acute distress.    Appearance: She is well-developed and well-nourished. She is obese. She is not ill-appearing.  HENT:     Head: Normocephalic and atraumatic.     Right Ear: Tympanic membrane normal.     Left Ear: Tympanic membrane normal.     Nose:  Nose normal.     Mouth/Throat:     Mouth: Mucous membranes are moist.     Pharynx: Oropharynx is clear.  Eyes:     Conjunctiva/sclera: Conjunctivae normal.  Cardiovascular:     Rate and Rhythm: Normal rate and regular rhythm.     Heart sounds: Normal heart sounds.  Pulmonary:     Effort: Pulmonary effort is normal. No respiratory distress.     Breath  sounds: Normal breath sounds.  Abdominal:     Palpations: Abdomen is soft.     Tenderness: There is no abdominal tenderness. There is no guarding or rebound.  Musculoskeletal:        General: No swelling, tenderness, deformity or edema. Normal range of motion.     Cervical back: Neck supple.  Skin:    General: Skin is warm and dry.     Findings: No bruising, erythema, lesion or rash.  Neurological:     General: No focal deficit present.     Mental Status: She is alert and oriented to person, place, and time.     Gait: Gait normal.  Psychiatric:        Mood and Affect: Mood and affect and mood normal.        Behavior: Behavior normal.      UC Treatments / Results  Labs (all labs ordered are listed, but only abnormal results are displayed) Labs Reviewed  POCT URINALYSIS DIPSTICK, ED / UC - Abnormal; Notable for the following components:      Result Value   Leukocytes,Ua TRACE (*)    All other components within normal limits  URINE CULTURE  SARS CORONAVIRUS 2 (TAT 6-24 HRS)    EKG   Radiology No results found.  Procedures Procedures (including critical care time)  Medications Ordered in UC Medications - No data to display  Initial Impression / Assessment and Plan / UC Course  I have reviewed the triage vital signs and the nursing notes.  Pertinent labs & imaging results that were available during my care of the patient were reviewed by me and considered in my medical decision making (see chart for details).   Viral illness, Lower abdominal pain, Low back pain, Elevated blood pressure reading.  Urine culture  pending.  COVID pending.  Instructed patient to self quarantine until the test results are back.  Discussed symptomatic treatment including Tylenol, rest, hydration.  Instructed patient to follow up with PCP if her symptoms are not improving.  Discussed with patient that her blood pressure is elevated today needs to be rechecked by her PCP in 2 to 4 weeks.  She agrees to plan of care.    Final Clinical Impressions(s) / UC Diagnoses   Final diagnoses:  Viral illness  Lower abdominal pain  Acute bilateral low back pain without sciatica  Elevated blood pressure reading     Discharge Instructions     The urine culture is pending.  We will call you if it shows the need for treatment.    Your COVID test is pending.  You should self quarantine until the test result is back.  Take Tylenol or ibuprofen as needed for fever or discomfort.  Rest and keep yourself hydrated.    Your blood pressure is elevated today at 149/98.  Please have this rechecked by your primary care provider in 2-4 weeks.         ED Prescriptions    None     I have reviewed the PDMP during this encounter.   Mickie Bail, NP 10/07/20 [phone]

## 2020-10-07 NOTE — Discharge Instructions (Addendum)
The urine culture is pending.  We will call you if it shows the need for treatment.    Your COVID test is pending.  You should self quarantine until the test result is back.  Take Tylenol or ibuprofen as needed for fever or discomfort.  Rest and keep yourself hydrated.    Your blood pressure is elevated today at 149/98.  Please have this rechecked by your primary care provider in 2-4 weeks.

## 2020-10-07 NOTE — ED Triage Notes (Signed)
Pt states that she has lower back pain and right knee pain that started Monday. Pt states that she may have UTI but denies any urinary sx. Pt states that she has minor swelling in her knee and fluid.

## 2020-10-08 LAB — URINE CULTURE

## 2020-10-08 LAB — SARS CORONAVIRUS 2 (TAT 6-24 HRS): SARS Coronavirus 2: NEGATIVE

## 2020-10-28 ENCOUNTER — Other Ambulatory Visit: Payer: Self-pay | Admitting: Infectious Disease

## 2020-10-28 DIAGNOSIS — I10 Essential (primary) hypertension: Secondary | ICD-10-CM

## 2020-11-18 ENCOUNTER — Encounter (HOSPITAL_COMMUNITY): Payer: Self-pay

## 2020-11-18 ENCOUNTER — Other Ambulatory Visit: Payer: Self-pay

## 2020-11-18 ENCOUNTER — Ambulatory Visit (HOSPITAL_COMMUNITY)
Admission: EM | Admit: 2020-11-18 | Discharge: 2020-11-18 | Disposition: A | Discharge status: Home / Self Care | Payer: Self-pay | Attending: Student | Admitting: Student

## 2020-11-18 DIAGNOSIS — R109 Unspecified abdominal pain: Secondary | ICD-10-CM

## 2020-11-18 DIAGNOSIS — K5901 Slow transit constipation: Secondary | ICD-10-CM

## 2020-11-18 DIAGNOSIS — M1711 Unilateral primary osteoarthritis, right knee: Secondary | ICD-10-CM

## 2020-11-18 DIAGNOSIS — Z3202 Encounter for pregnancy test, result negative: Secondary | ICD-10-CM

## 2020-11-18 DIAGNOSIS — I10 Essential (primary) hypertension: Secondary | ICD-10-CM

## 2020-11-18 LAB — POCT URINALYSIS DIPSTICK, ED / UC
Bilirubin Urine: NEGATIVE
Glucose, UA: NEGATIVE mg/dL
Hgb urine dipstick: NEGATIVE
Ketones, ur: NEGATIVE mg/dL
Nitrite: NEGATIVE
Protein, ur: 30 mg/dL — AB
Specific Gravity, Urine: 1.02 (ref 1.005–1.030)
Urobilinogen, UA: 0.2 mg/dL (ref 0.0–1.0)
pH: 5 (ref 5.0–8.0)

## 2020-11-18 LAB — POC URINE PREG, ED: Preg Test, Ur: NEGATIVE

## 2020-11-18 MED ORDER — DOCUSATE SODIUM 100 MG PO CAPS: 100.0000 mg | ORAL_CAPSULE | Freq: Two times a day (BID) | ORAL | 0 refills | Status: DC

## 2020-11-18 NOTE — ED Provider Notes (Signed)
[facility]    CSN: [id number] Arrival date & time: 11/18/20  1702      History   Chief Complaint Chief Complaint  Patient presents with  . Abdominal Cramping    HPI Monique [name] is a 50 y.o. female presenting with abdominal pain since this morning.  States this feels like her period.  Last period ended 5 days ago.  History of HIV, morbid obesity, tuberculosis, constipation.  Endorses long history of constipation, states she has not taken anything for this yet. last bowel movement was 3 days ago and was normal, she is still passing gas.  Describes abdominal pain is generalized and crampy.  Has not identified triggers or mitigating factors.  Denies urinary symptoms, URI symptoms.  Denies nausea, vomiting, diarrhea. Denies hematuria, dysuria, frequency, urgency, back pain, n/v/d, fevers/chills, abdnormal vaginal discharge.   Also endorses right knee pain for 3 weeks now.  Denies trauma but states that she walks a lot at work.  States that she can feel it clicking when she walks.  Endorses some pain with bending it as well.  Denies sensation changes, pain radiating down leg, back pain.  Has not taken her antihypertensives yet today, denies headaches, dizziness, shortness of breath, chest pain, weakness or sensation changes in 1 arm or 1 leg.   HPI  Past Medical History:  Diagnosis Date  . COVID-19 virus infection 11/23/2019  . Exertional chest pain 05/13/2017  . HIV (human immunodeficiency virus infection) (HCC)   . Hypertension   . 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 Lower extremity edema 01/12/2015  . Lung abscess (HCC)   . Morbid obesity (HCC)   . Pneumonia   . TB (tuberculosis)   . Tonsillitis   . Viral URI 09/16/2017  . Weight gain 10/27/2018    Patient Active Problem List   Diagnosis Date Noted  . Screening for cervical cancer 04/24/2020  . Encounter for screening mammogram for malignant neoplasm of breast  04/24/2020  . Morbid obesity (HCC)   . Hypertension   . Sinusitis 09/10/2019  . Seasonal allergies 01/26/2019  . Weight gain 10/27/2018  . Myalgia 07/04/2018  . Healthcare maintenance 05/19/2018  . Exertional chest pain 05/13/2017  . Nausea 02/06/2017  . Obese 08/10/2015  . Essential hypertension 01/12/2015  . 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 Human immunodeficiency virus (HIV) disease (HCC) 04/10/2013    Past Surgical History:  Procedure Laterality Date  . CESAREAN SECTION    . VIDEO BRONCHOSCOPY Bilateral 04/13/2013   Procedure: VIDEO BRONCHOSCOPY WITHOUT FLUORO;  Surgeon: Kalman ShanMurali Ramaswamy, MD;  [facility];  Service: Cardiopulmonary;  Laterality: Bilateral;    OB History   No obstetric history on file.      Home Medications    Prior to Admission medications   Medication Sig Start Date End Date Taking? Authorizing Provider  docusate sodium (COLACE) 100 MG capsule Take 1 capsule (100 mg total) by mouth every 12 (twelve) hours. 1-2 pills daily 11/18/20  Yes Rhys MartiniGraham, Arnetta Odeh E, PA-C  acetaminophen (TYLENOL) 500 MG tablet Take 2 tablets (1,000 mg total) by mouth every 8 (eight) hours as needed for mild pain or fever. 07/04/18   Dorrell, Cathleen Cortieborah N, MD  albuterol (VENTOLIN HFA) 108 (90 Base) MCG/ACT inhaler Inhale 1-2 puffs into the lungs every 6 (six) hours as needed for wheezing or shortness of breath. 11/23/19   Daiva EvesVan Dam, Lisette Grinderornelius N, MD  amLODipine (NORVASC) 10 MG tablet  TAKE 1 TABLET(10 MG) BY MOUTH DAILY 10/28/20   Daiva Eves, Lisette Grinder, MD  benzonatate (TESSALON) 100 MG capsule Take 1 capsule by mouth every 8 (eight) hours for cough. 07/27/20   Mardella Layman, MD  bictegravir-emtricitabine-tenofovir AF (BIKTARVY) 50-200-25 MG TABS tablet Take 1 tablet by mouth daily. 11/23/19   Randall Hiss, MD  cetirizine (ZYRTEC ALLERGY) 10 MG tablet Take 1 tablet (10 mg total) by mouth daily. 12/24/19 12/23/20  Bloomfield, Carley D, DO   fluticasone (FLONASE) 50 MCG/ACT nasal spray Place 2 sprays into both nostrils daily. 11/23/19 11/22/20  Randall Hiss, MD  hydrochlorothiazide (HYDRODIURIL) 25 MG tablet TAKE 1 TABLET(25 MG) BY MOUTH DAILY 10/28/20   Daiva Eves, Lisette Grinder, MD  loratadine (CLARITIN) 10 MG tablet Take 1 tablet (10 mg total) by mouth daily. 11/25/18   Wieters, Junius Creamer, PA-C    Family History Family History  Problem Relation Age of Onset  . Diabetes Sister   . Hypertension Sister   . Hypertension Mother   . Hypertension Father     Social History Social History   Tobacco Use  . Smoking status: Former Smoker    Quit date: 12/22/2013    Years since quitting: 6.9  . Smokeless tobacco: Never Used  Vaping Use  . Vaping Use: Never used  Substance Use Topics  . Alcohol use: No    Alcohol/week: 0.0 standard drinks  . Drug use: No     Allergies   Patient has no known allergies.   Review of Systems Review of Systems  Constitutional: Negative for appetite change, chills, diaphoresis and fever.  Respiratory: Negative for shortness of breath.   Cardiovascular: Negative for chest pain.  Gastrointestinal: Positive for abdominal pain. Negative for blood in stool, constipation, diarrhea, nausea and vomiting.  Genitourinary: Negative for decreased urine volume, difficulty urinating, dysuria, flank pain, frequency, genital sores, hematuria and urgency.  Musculoskeletal: Negative for back pain.       R knee pain  Neurological: Negative for dizziness, weakness and light-headedness.  All other systems reviewed and are negative.    Physical Exam Triage Vital Signs ED Triage Vitals  Enc Vitals Group     BP 11/18/20 1801 (!) 159/112     Pulse Rate 11/18/20 1801 85     Resp 11/18/20 1801 18     Temp 11/18/20 1801 98.4 F (36.9 C)     Temp Source 11/18/20 1801 Oral     SpO2 11/18/20 1801 97 %     Weight --      Height --      Head Circumference --      Peak Flow --      Pain Score 11/18/20 1800 6      Pain Loc --      Pain Edu? --      Excl. in GC? --    No data found.  Updated Vital Signs BP (!) 159/112 (BP Location: Right Arm)   Pulse 85   Temp 98.4 F (36.9 C) (Oral)   Resp 18   LMP 11/11/2020 (Exact Date)   SpO2 97%   Visual Acuity Right Eye Distance:   Left Eye Distance:   Bilateral Distance:    Right Eye Near:   Left Eye Near:    Bilateral Near:     Physical Exam Vitals reviewed.  Constitutional:      General: She is not in acute distress.    Appearance: Normal appearance. She is not ill-appearing.  HENT:  Head: Normocephalic and atraumatic.     Mouth/Throat:     Mouth: Mucous membranes are moist.     Comments: Moist mucous membranes Eyes:     Extraocular Movements: Extraocular movements intact.     Pupils: Pupils are equal, round, and reactive to light.  Cardiovascular:     Rate and Rhythm: Normal rate and regular rhythm.     Heart sounds: Normal heart sounds.  Pulmonary:     Effort: Pulmonary effort is normal.     Breath sounds: Normal breath sounds. No wheezing, rhonchi or rales.  Abdominal:     General: Bowel sounds are normal. There is no distension.     Palpations: Abdomen is soft. There is no mass.     Tenderness: There is generalized abdominal tenderness. There is no right CVA tenderness, left CVA tenderness, guarding or rebound. Negative signs include Murphy's sign, Rovsing's sign and McBurney's sign.  Musculoskeletal:     Comments: Right knee crepitus with flexion and extension.  No joint line tenderness, effusion, laxity.  Sensation intact.  Skin:    General: Skin is warm.     Capillary Refill: Capillary refill takes less than 2 seconds.     Comments: Good skin turgor  Neurological:     General: No focal deficit present.     Mental Status: She is alert and oriented to person, place, and time.  Psychiatric:        Mood and Affect: Mood normal.        Behavior: Behavior normal.        Thought Content: Thought content normal.         Judgment: Judgment normal.      UC Treatments / Results  Labs (all labs ordered are listed, but only abnormal results are displayed) Labs Reviewed  POCT URINALYSIS DIPSTICK, ED / UC - Abnormal; Notable for the following components:      Result Value   Protein, ur 30 (*)    Leukocytes,Ua SMALL (*)    All other components within normal limits  POC URINE PREG, ED    EKG   Radiology No results found.  Procedures Procedures (including critical care time)  Medications Ordered in UC Medications - No data to display  Initial Impression / Assessment and Plan / UC Course  I have reviewed the triage vital signs and the nursing notes.  Pertinent labs & imaging results that were available during my care of the patient were reviewed by me and considered in my medical decision making (see chart for details).     This patient is a 50 year old female presenting with constipation and R knee OA. Today this pt is afebrile nontachycardic nontachypneic, oxygenating well on room air, no wheezes rhonchi or rales.   Urine pregnancy negative.  UA with small leuk, otherwise wnl. Culture sent.   For hypertension, continue amlodipine and hctz.  She has not taken her antihypertensives yet today.  For constipation, Colace sent.  Rec good hydration, cruciferous vegetables.  For R knee OA, knee sleeve provided. F/u with ortho if symptoms persist.  Return precautions discussed.  This chart was dictated using voice recognition software, Dragon. Despite the best efforts of this provider to proofread and correct errors, errors may still occur which can change documentation meaning.  Final Clinical Impressions(s) / UC Diagnoses   Final diagnoses:  Slow transit constipation  Primary osteoarthritis of right knee  Essential hypertension     Discharge Instructions     -For constipation, start the medication-Colace (docusate  sodium).  This is a stool softener that will help you pass a regular  bowel movement.  Take 1 to 2 pills daily until you have regular bowel movements.   -Drink plenty of water and eat leafy green vegetables and fruits. -If your abdominal pain gets worse, you still do not pass a bowel movement, you stop passing gas-seek additional medical attention. -Make sure to take your amlodipine when you get home.  Please check your blood pressure at home or at the pharmacy. If this continues to be >140/90, follow-up with your primary care provider for further blood pressure management/ medication titration. If you develop chest pain, shortness of breath, vision changes, the worst headache of your life- head straight to the ED or call 911. -Keep your knee brace on while working and ambulating for the next 1 to 2 weeks.  Continue to use this if you still have pain.  Follow-up with orthopedist if symptoms worsen or persist; information below.     ED Prescriptions    Medication Sig Dispense Auth. Provider   docusate sodium (COLACE) 100 MG capsule Take 1 capsule (100 mg total) by mouth every 12 (twelve) hours. 1-2 pills daily 60 capsule Rhys Martini, PA-C     PDMP not reviewed this encounter.   Rhys Martini, PA-C 11/18/20 1905

## 2020-11-18 NOTE — Discharge Instructions (Addendum)
-  For constipation, start the medication-Colace (docusate sodium).  This is a stool softener that will help you pass a regular bowel movement.  Take 1 to 2 pills daily until you have regular bowel movements.   -Drink plenty of water and eat leafy green vegetables and fruits. -If your abdominal pain gets worse, you still do not pass a bowel movement, you stop passing gas-seek additional medical attention. -Make sure to take your amlodipine when you get home.  Please check your blood pressure at home or at the pharmacy. If this continues to be >140/90, follow-up with your primary care provider for further blood pressure management/ medication titration. If you develop chest pain, shortness of breath, vision changes, the worst headache of your life- head straight to the ED or call 911. -Keep your knee brace on while working and ambulating for the next 1 to 2 weeks.  Continue to use this if you still have pain.  Follow-up with orthopedist if symptoms worsen or persist; information below.

## 2020-11-18 NOTE — ED Triage Notes (Signed)
Pt c/o stomach pain x this morning. Pt states it feels like menstrual cramps. Pt denies vomiting and nausea.

## 2020-11-21 ENCOUNTER — Encounter: Payer: Self-pay | Admitting: Internal Medicine

## 2020-12-01 ENCOUNTER — Other Ambulatory Visit: Payer: Self-pay | Admitting: Infectious Disease

## 2020-12-01 DIAGNOSIS — B20 Human immunodeficiency virus [HIV] disease: Secondary | ICD-10-CM

## 2021-02-22 ENCOUNTER — Other Ambulatory Visit: Payer: Self-pay | Admitting: Family

## 2021-02-22 DIAGNOSIS — B20 Human immunodeficiency virus [HIV] disease: Secondary | ICD-10-CM

## 2021-02-28 ENCOUNTER — Encounter: Payer: Self-pay | Admitting: *Deleted

## 2021-03-03 ENCOUNTER — Telehealth: Payer: Self-pay

## 2021-03-03 NOTE — Telephone Encounter (Signed)
Patient left voicemail requesting call back regarding appointment. RN returned call, no answer and unable to leave voicemail.   Sandie Ano, RN

## 2021-03-07 ENCOUNTER — Other Ambulatory Visit (HOSPITAL_COMMUNITY): Admission: RE | Admit: 2021-03-07 | Payer: Self-pay | Source: Ambulatory Visit | Admitting: Infectious Disease

## 2021-03-07 ENCOUNTER — Encounter: Payer: Self-pay | Admitting: Infectious Disease

## 2021-03-07 ENCOUNTER — Other Ambulatory Visit: Payer: Self-pay

## 2021-03-07 ENCOUNTER — Ambulatory Visit (INDEPENDENT_AMBULATORY_CARE_PROVIDER_SITE_OTHER): Payer: Self-pay | Admitting: Infectious Disease

## 2021-03-07 VITALS — BP 138/89 | HR 83 | Temp 98.1°F | Wt 228.0 lb

## 2021-03-07 DIAGNOSIS — I10 Essential (primary) hypertension: Secondary | ICD-10-CM

## 2021-03-07 DIAGNOSIS — R1031 Right lower quadrant pain: Secondary | ICD-10-CM

## 2021-03-07 DIAGNOSIS — R1030 Lower abdominal pain, unspecified: Secondary | ICD-10-CM

## 2021-03-07 DIAGNOSIS — R5383 Other fatigue: Secondary | ICD-10-CM

## 2021-03-07 DIAGNOSIS — B20 Human immunodeficiency virus [HIV] disease: Secondary | ICD-10-CM

## 2021-03-07 DIAGNOSIS — R635 Abnormal weight gain: Secondary | ICD-10-CM

## 2021-03-07 HISTORY — DX: Other fatigue: R53.83

## 2021-03-07 HISTORY — DX: Lower abdominal pain, unspecified: R10.30

## 2021-03-07 NOTE — Progress Notes (Signed)
Subjective:  Chief complaint: Monique [name] after 7-day periods of work, still with left-sided groin pain  Patient ID: Monique [name], female    DOB: [birth year], 50 y.o.   MRN: [id number]   Monique [name] is a 50 year old African-American lady who I am known for many years now is initially diagnosed with HIV and a CD4 nadir in the 70s.  She rapidly suppressed her virus and restored her T cells above 400.  She did gain quite a bit of weight after initially losing weight with her HIV infection and illness.  She says that she went down to 150 pounds.  Since then she has gained weight well above 200, go up to 204 pounds within the first 6 months of her treatment and then  now up to 30 pounds.  She was initially treated with Triumeq and then more recently with Biktarvy.  I think she would be a  candidate for an ACT G study looking at switching to DOravirine + Truvada vs Doravirine + Descovy vs staying on current regimen that she would be an excellent candidate for and we discussed this study.again today.  Monique [name] did have COVID-19 infection past summer   Monique [name] has had 2 doses of Pfizer vaccine but not had booster  She was still reluctant to get it today when I talked to her due to how early she felt after her second Pfizer vaccine.  I think she would tolerate this 1 well but maybe she should have it when she has time off from work in case she has a reaction to the third vaccine that is similar to second dose.  She has had concerns about the fact that when she works 7-day shifts she will frequently get profoundly fatigued and this will cause her to miss further work.  Is been going on for several months if not years.  She always has been working in places where she has to physically exert herself.  I wonder if she may have some component of obesity hypoventilation or restrictive airway disease possibly or some other issue causing her to have a softball tolerance to her work when is for a 7-day stretch.  She  also is complaining of some left-sided groin pain and was worried if she might have a urinary tract infection.  She does not have any dysuria fever or flank pain.  She also wondered if the symptom might be related to the [facility] which certainly would not be.    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   Lower extremity edema 01/12/2015   Lung abscess (HCC)    Morbid obesity (HCC)    Pneumonia    TB (tuberculosis)    Tonsillitis    Viral URI 09/16/2017   Weight gain 10/27/2018    Past Surgical History:  Procedure Laterality Date   CESAREAN SECTION     VIDEO BRONCHOSCOPY Bilateral 04/13/2013   Procedure: VIDEO BRONCHOSCOPY WITHOUT FLUORO;  Surgeon: Kalman Shan, MD;  Location: [facility];  Service: Cardiopulmonary;  Laterality: Bilateral;    Family History  Problem Relation Age of Onset   Diabetes Sister    Hypertension Sister    Hypertension Mother    Hypertension Father       Social History  Socioeconomic History   Marital status: Single    Spouse name: Not on file   Number of children: Not on file   Years of education: Not on file   Highest education level: Not on file  Occupational History   Not on file  Tobacco Use   Smoking status: Former    Pack years: 0.00    Types: Cigarettes    Quit date: 12/22/2013    Years since quitting: 7.2   Smokeless tobacco: Never  Vaping Use   Vaping Use: Never used  Substance and Sexual Activity   Alcohol use: No    Alcohol/week: 0.0 standard drinks   Drug use: No   Sexual activity: Not Currently    Partners: Male    Birth control/protection: None  Other Topics Concern   Not on file  Social History Narrative   Not on file   Social Determinants of Health    Financial Resource Strain: Not on file  Food Insecurity: Not on file  Transportation Needs: Not on file  Physical Activity: Not on file  Stress: Not on file  Social Connections: Not on file    No Known Allergies   Current Outpatient Medications:    amLODipine (NORVASC) 10 MG tablet, TAKE 1 TABLET(10 MG) BY MOUTH DAILY, Disp: 30 tablet, Rfl: 11   BIKTARVY 50-200-25 MG TABS tablet, TAKE 1 TABLET BY MOUTH DAILY, Disp: 30 tablet, Rfl: 3   hydrochlorothiazide (HYDRODIURIL) 25 MG tablet, TAKE 1 TABLET(25 MG) BY MOUTH DAILY, Disp: 30 tablet, Rfl: 11   acetaminophen (TYLENOL) 500 MG tablet, Take 2 tablets (1,000 mg total) by mouth every 8 (eight) hours as needed for mild pain or fever. (Patient not taking: Reported on 03/07/2021), Disp: 30 tablet, Rfl: 0   albuterol (VENTOLIN HFA) 108 (90 Base) MCG/ACT inhaler, Inhale 1-2 puffs into the lungs every 6 (six) hours as needed for wheezing or shortness of breath. (Patient not taking: Reported on 03/07/2021), Disp: 18 g, Rfl: 5   benzonatate (TESSALON) 100 MG capsule, Take 1 capsule by mouth every 8 (eight) hours for cough. (Patient not taking: Reported on 03/07/2021), Disp: 21 capsule, Rfl: 0   cetirizine (ZYRTEC ALLERGY) 10 MG tablet, Take 1 tablet (10 mg total) by mouth daily., Disp: 30 tablet, Rfl: 2   docusate sodium (COLACE) 100 MG capsule, Take 1 capsule (100 mg total) by mouth every 12 (twelve) hours. 1-2 pills daily (Patient not taking: Reported on 03/07/2021), Disp: 60 capsule, Rfl: 0   fluticasone (FLONASE) 50 MCG/ACT nasal spray, Place 2 sprays into both nostrils daily., Disp: 1 g, Rfl: 5   loratadine (CLARITIN) 10 MG tablet, Take 1 tablet (10 mg total) by mouth daily. (Patient not taking: Reported on 03/07/2021), Disp: 15 tablet, Rfl: 0   Review of Systems  Constitutional:  Negative for activity change, appetite change, chills, diaphoresis, fatigue, fever and unexpected weight change.  HENT:  Negative for congestion, facial swelling,  rhinorrhea, sinus pressure, sneezing, sore throat and trouble swallowing.   Eyes:  Negative for photophobia and visual disturbance.  Respiratory:  Negative for cough, chest tightness, shortness of breath, wheezing and stridor.   Cardiovascular:  Negative for chest pain, palpitations and leg swelling.  Gastrointestinal:  Negative for abdominal distention, abdominal pain, anal bleeding, blood in stool, constipation, diarrhea, nausea and vomiting.  Genitourinary:  Negative for difficulty urinating, dysuria, flank pain and hematuria.  Musculoskeletal:  Negative for arthralgias, back pain, gait problem, joint swelling and myalgias.  Skin:  Negative for color change, pallor, rash  and wound.  Neurological:  Negative for dizziness, tremors, weakness and light-headedness.  Hematological:  Negative for adenopathy. Does not bruise/bleed easily.  Psychiatric/Behavioral:  Negative for agitation, behavioral problems, confusion, decreased concentration, dysphoric mood and sleep disturbance.       Objective:   Physical Exam Constitutional:      General: She is not in acute distress.    Appearance: Normal appearance. She is well-developed. She is obese. She is not ill-appearing or diaphoretic.  HENT:     Head: Normocephalic and atraumatic.     Right Ear: Hearing and external ear normal.     Left Ear: Hearing and external ear normal.     Nose: No nasal deformity or rhinorrhea.  Eyes:     General: No scleral icterus.    Conjunctiva/sclera: Conjunctivae normal.     Right eye: Right conjunctiva is not injected.     Left eye: Left conjunctiva is not injected.  Neck:     Vascular: No JVD.  Cardiovascular:     Rate and Rhythm: Normal rate and regular rhythm.     Heart sounds: S1 normal and S2 normal.  Pulmonary:     Effort: Pulmonary effort is normal. No respiratory distress.  Abdominal:     General: Bowel sounds are normal. There is no distension.     Palpations: Abdomen is soft. There is no mass.      Tenderness: There is no abdominal tenderness. There is no guarding or rebound.     Hernia: No hernia is present.  Musculoskeletal:        General: Normal range of motion.     Right shoulder: Normal.     Left shoulder: Normal.     Cervical back: Normal range of motion and neck supple.     Right hip: Normal.     Left hip: Normal.     Right knee: Normal.     Left knee: Normal.  Lymphadenopathy:     Head:     Right side of head: No submandibular, preauricular or posterior auricular adenopathy.     Left side of head: No submandibular, preauricular or posterior auricular adenopathy.     Cervical: No cervical adenopathy.     Right cervical: No superficial or deep cervical adenopathy.    Left cervical: No superficial or deep cervical adenopathy.  Skin:    General: Skin is warm and dry.     Coloration: Skin is not pale.     Findings: No abrasion, bruising, ecchymosis, erythema, lesion or rash.     Nails: There is no clubbing.  Neurological:     General: No focal deficit present.     Mental Status: She is alert and oriented to person, place, and time. Mental status is at baseline.     Sensory: No sensory deficit.     Coordination: Coordination normal.     Gait: Gait normal.  Psychiatric:        Attention and Perception: She is attentive.        Mood and Affect: Mood normal.        Speech: Speech normal.        Behavior: Behavior normal. Behavior is cooperative.        Thought Content: Thought content normal.        Judgment: Judgment normal.          Assessment & Plan:    HIV disease: Continue Biktarvy for now but I think she would be a great candidate for the DO-IT study  and I had her meet with Deirdre Evener, renew HMAP but think about PCAP and ACA plan, labs today  Obesity: see #1 try carbohydrate restriction as well  Hypertension continue current meds  ASCUS: she was referred for colposcopy  Sided groin pain: Do not think this is due to a urinary tract infection but will  check a urine analysis.  Fatigue with exertion: Wonder if she has restrictive airway disease due to her obesity, obesity hypoventilation possibly.  I will also check TSH panel  I spent more than with the patient including face to face counseling of the patient personally reviewing radiographs, along with pertinent laboratory microbiological, virological data review of medical records before and during the visit and in coordination of her care.

## 2021-03-08 LAB — URINALYSIS, ROUTINE W REFLEX MICROSCOPIC
Bilirubin Urine: NEGATIVE
Glucose, UA: NEGATIVE
Hgb urine dipstick: NEGATIVE
Ketones, ur: NEGATIVE
Leukocytes,Ua: NEGATIVE
Nitrite: NEGATIVE
Protein, ur: NEGATIVE
Specific Gravity, Urine: 1.017 (ref 1.001–1.035)
pH: 5.5 (ref 5.0–8.0)

## 2021-03-08 LAB — URINE CYTOLOGY ANCILLARY ONLY
Chlamydia: NEGATIVE
Comment: NEGATIVE
Comment: NORMAL
Neisseria Gonorrhea: NEGATIVE

## 2021-03-08 LAB — T-HELPER CELL (CD4) - (RCID CLINIC ONLY)
CD4 % Helper T Cell: 35 % (ref 33–65)
CD4 T Cell Abs: 641 /uL (ref 400–1790)

## 2021-03-08 LAB — URINE CULTURE
MICRO NUMBER:: 12109885
Result:: NO GROWTH
SPECIMEN QUALITY:: ADEQUATE

## 2021-03-09 LAB — CBC WITH DIFFERENTIAL/PLATELET
Absolute Monocytes: 781 cells/uL (ref 200–950)
Basophils Absolute: 47 cells/uL (ref 0–200)
Basophils Relative: 0.5 %
Eosinophils Absolute: 177 cells/uL (ref 15–500)
Eosinophils Relative: 1.9 %
HCT: 39.7 % (ref 35.0–45.0)
Hemoglobin: 13 g/dL (ref 11.7–15.5)
Lymphs Abs: 1934 cells/uL (ref 850–3900)
MCH: 27.5 pg (ref 27.0–33.0)
MCHC: 32.7 g/dL (ref 32.0–36.0)
MCV: 84.1 fL (ref 80.0–100.0)
MPV: 11.2 fL (ref 7.5–12.5)
Monocytes Relative: 8.4 %
Neutro Abs: 6361 cells/uL (ref 1500–7800)
Neutrophils Relative %: 68.4 %
Platelets: 308 10*3/uL (ref 140–400)
RBC: 4.72 10*6/uL (ref 3.80–5.10)
RDW: 15.5 % — ABNORMAL HIGH (ref 11.0–15.0)
Total Lymphocyte: 20.8 %
WBC: 9.3 10*3/uL (ref 3.8–10.8)

## 2021-03-09 LAB — COMPLETE METABOLIC PANEL WITH GFR
AG Ratio: 1.2 (calc) (ref 1.0–2.5)
ALT: 20 U/L (ref 6–29)
AST: 24 U/L (ref 10–35)
Albumin: 3.9 g/dL (ref 3.6–5.1)
Alkaline phosphatase (APISO): 72 U/L (ref 37–153)
BUN: 19 mg/dL (ref 7–25)
CO2: 24 mmol/L (ref 20–32)
Calcium: 9.3 mg/dL (ref 8.6–10.4)
Chloride: 104 mmol/L (ref 98–110)
Creat: 0.93 mg/dL (ref 0.50–1.03)
Globulin: 3.2 g/dL (calc) (ref 1.9–3.7)
Glucose, Bld: 90 mg/dL (ref 65–99)
Potassium: 3.7 mmol/L (ref 3.5–5.3)
Sodium: 136 mmol/L (ref 135–146)
Total Bilirubin: 0.2 mg/dL (ref 0.2–1.2)
Total Protein: 7.1 g/dL (ref 6.1–8.1)
eGFR: 75 mL/min/{1.73_m2} (ref >=60)

## 2021-03-09 LAB — LIPID PANEL
Cholesterol: 174 mg/dL (ref <200)
HDL: 41 mg/dL — ABNORMAL LOW (ref >=50)
LDL Cholesterol (Calc): 105 mg/dL (calc) — ABNORMAL HIGH
Non-HDL Cholesterol (Calc): 133 mg/dL (calc) — ABNORMAL HIGH (ref <130)
Total CHOL/HDL Ratio: 4.2 (calc) (ref <5.0)
Triglycerides: 161 mg/dL — ABNORMAL HIGH (ref <150)

## 2021-03-09 LAB — THYROID PANEL WITH TSH
Free Thyroxine Index: 2.2 (ref 1.4–3.8)
T3 Uptake: 28 % (ref 22–35)
T4, Total: 7.7 ug/dL (ref 5.1–11.9)
TSH: 2.85 mIU/L

## 2021-03-09 LAB — HIV-1 RNA QUANT-NO REFLEX-BLD
HIV 1 RNA Quant: NOT DETECTED Copies/mL
HIV-1 RNA Quant, Log: NOT DETECTED Log cps/mL

## 2021-03-09 LAB — RPR: RPR Ser Ql: NONREACTIVE

## 2021-03-13 ENCOUNTER — Encounter (INDEPENDENT_AMBULATORY_CARE_PROVIDER_SITE_OTHER): Payer: Self-pay | Admitting: *Deleted

## 2021-03-13 ENCOUNTER — Other Ambulatory Visit: Payer: Self-pay

## 2021-03-13 VITALS — BP 132/87 | HR 80 | Temp 98.2°F | Ht 62.5 in | Wt 230.2 lb

## 2021-03-13 DIAGNOSIS — Z006 Encounter for examination for normal comparison and control in clinical research program: Secondary | ICD-10-CM

## 2021-03-13 NOTE — Research (Signed)
Monique [name] was here today to screen for the Do-It study. She had taken the consent home and read over it before coming in today.  She was able to verbalize the purpose of the study and we discussed it in detail and answered all her questions. She understands that she will be randomized to one of three treatment options and is expected to stay on that the course of the study. She was diagnosed in 2014 when she became seriously ill and was hospitalized. She said her baseline weight before that illness was around 150 lbs. And has put on significant weight in the first 3 years after starting treatment. She originally started on tivicay and descovy and was switched to triumeq and has been on biktarvy since 2018. She denies any current problems. She does have hypertension and takes 2 meds for that. Currently she reports not being sexually active and is not trying to be and is not on any birth control. She understands that she will need to take birth control during the study if she does become sexually active.  She had an implant for birth control inserted in the 90s which has never been removed. Entry is planned for 8/5.

## 2021-03-14 ENCOUNTER — Encounter: Payer: Self-pay | Admitting: *Deleted

## 2021-03-15 LAB — COMPREHENSIVE METABOLIC PANEL
AG Ratio: 1.2 (calc) (ref 1.0–2.5)
ALT: 18 U/L (ref 6–29)
AST: 14 U/L (ref 10–35)
Albumin: 3.7 g/dL (ref 3.6–5.1)
Alkaline phosphatase (APISO): 67 U/L (ref 37–153)
BUN: 15 mg/dL (ref 7–25)
CO2: 26 mmol/L (ref 20–32)
Calcium: 9.3 mg/dL (ref 8.6–10.4)
Chloride: 108 mmol/L (ref 98–110)
Creat: 0.63 mg/dL (ref 0.50–1.03)
Globulin: 3.2 g/dL (calc) (ref 1.9–3.7)
Glucose, Bld: 89 mg/dL (ref 65–99)
Potassium: 4.4 mmol/L (ref 3.5–5.3)
Sodium: 139 mmol/L (ref 135–146)
Total Bilirubin: 0.2 mg/dL (ref 0.2–1.2)
Total Protein: 6.9 g/dL (ref 6.1–8.1)

## 2021-03-15 LAB — BILIRUBIN, DIRECT: Bilirubin, Direct: 0 mg/dL (ref 0.0–0.2)

## 2021-03-15 LAB — PHOSPHORUS: Phosphorus: 3.2 mg/dL (ref 2.5–4.5)

## 2021-03-15 LAB — HIV-1 RNA QUANT-NO REFLEX-BLD
HIV 1 RNA Quant: NOT DETECTED Copies/mL
HIV-1 RNA Quant, Log: NOT DETECTED Log cps/mL

## 2021-03-15 LAB — HCG, SERUM, QUALITATIVE: Preg, Serum: NEGATIVE

## 2021-03-23 ENCOUNTER — Other Ambulatory Visit: Payer: Self-pay

## 2021-03-23 ENCOUNTER — Ambulatory Visit: Payer: Self-pay

## 2021-03-28 ENCOUNTER — Other Ambulatory Visit: Payer: Self-pay

## 2021-03-28 ENCOUNTER — Ambulatory Visit: Payer: Self-pay

## 2021-03-28 ENCOUNTER — Encounter: Payer: Self-pay | Admitting: *Deleted

## 2021-03-28 ENCOUNTER — Other Ambulatory Visit: Payer: Self-pay | Admitting: *Deleted

## 2021-03-28 DIAGNOSIS — B20 Human immunodeficiency virus [HIV] disease: Secondary | ICD-10-CM

## 2021-03-28 MED ORDER — BIKTARVY 50-200-25 MG PO TABS
1.0000 | ORAL_TABLET | Freq: Every day | ORAL | 3 refills | Status: DC
Start: 2021-03-28 — End: 2021-07-31

## 2021-03-31 ENCOUNTER — Encounter: Payer: Self-pay | Admitting: *Deleted

## 2021-04-04 ENCOUNTER — Encounter (INDEPENDENT_AMBULATORY_CARE_PROVIDER_SITE_OTHER): Payer: Self-pay | Admitting: *Deleted

## 2021-04-04 ENCOUNTER — Encounter: Payer: Self-pay | Admitting: *Deleted

## 2021-04-04 ENCOUNTER — Other Ambulatory Visit: Payer: Self-pay

## 2021-04-04 VITALS — BP 144/88 | HR 80 | Temp 98.6°F | Wt 226.5 lb

## 2021-04-04 DIAGNOSIS — Z006 Encounter for examination for normal comparison and control in clinical research program: Secondary | ICD-10-CM

## 2021-04-04 LAB — PHOSPHORUS: Phosphorus: 3.3 mg/dL (ref 2.5–4.5)

## 2021-04-04 LAB — LIPID PANEL
Cholesterol: 188 mg/dL (ref <200)
HDL: 38 mg/dL — ABNORMAL LOW (ref >=50)
LDL Cholesterol (Calc): 134 mg/dL (calc) — ABNORMAL HIGH
Non-HDL Cholesterol (Calc): 150 mg/dL (calc) — ABNORMAL HIGH (ref <130)
Total CHOL/HDL Ratio: 4.9 (calc) (ref <5.0)
Triglycerides: 65 mg/dL (ref <150)

## 2021-04-04 LAB — BILIRUBIN, DIRECT: Bilirubin, Direct: 0.1 mg/dL (ref 0.0–0.2)

## 2021-04-04 LAB — COMPREHENSIVE METABOLIC PANEL
AG Ratio: 1.3 (calc) (ref 1.0–2.5)
ALT: 15 U/L (ref 6–29)
AST: 13 U/L (ref 10–35)
Albumin: 4 g/dL (ref 3.6–5.1)
Alkaline phosphatase (APISO): 64 U/L (ref 37–153)
BUN: 11 mg/dL (ref 7–25)
CO2: 26 mmol/L (ref 20–32)
Calcium: 9.2 mg/dL (ref 8.6–10.4)
Chloride: 106 mmol/L (ref 98–110)
Creat: 0.67 mg/dL (ref 0.50–1.03)
Globulin: 3.2 g/dL (calc) (ref 1.9–3.7)
Glucose, Bld: 86 mg/dL (ref 65–99)
Potassium: 4.1 mmol/L (ref 3.5–5.3)
Sodium: 138 mmol/L (ref 135–146)
Total Bilirubin: 0.4 mg/dL (ref 0.2–1.2)
Total Protein: 7.2 g/dL (ref 6.1–8.1)

## 2021-04-04 LAB — POCT URINE PREGNANCY: Preg Test, Ur: NEGATIVE

## 2021-04-04 NOTE — Research (Signed)
Mikaia was here today to enroll on the Do-IT study. We obtained a urine specimen from her first for a POCT pregnancy test which was negative before she was randomized. After eligibility was confirmed, she was randomized to stay on her current regimen of Biktarvy. She was noted to have a rash on her left ankle by Tresa Endo during the PE. She is going toady for the DEXA scan for the study and will be returning in 4 weeks for the next study visit.

## 2021-04-05 LAB — URINALYSIS, ROUTINE W REFLEX MICROSCOPIC
Bacteria, UA: NONE SEEN /HPF
Bilirubin Urine: NEGATIVE
Glucose, UA: NEGATIVE
Hgb urine dipstick: NEGATIVE
Hyaline Cast: NONE SEEN /LPF
Ketones, ur: NEGATIVE
Nitrite: NEGATIVE
Protein, ur: NEGATIVE
RBC / HPF: NONE SEEN /HPF (ref 0–2)
Specific Gravity, Urine: 1.013 (ref 1.001–1.035)
WBC, UA: NONE SEEN /HPF (ref 0–5)
pH: 6 (ref 5.0–8.0)

## 2021-04-05 LAB — MICROSCOPIC MESSAGE

## 2021-04-13 ENCOUNTER — Encounter: Payer: Self-pay | Admitting: *Deleted

## 2021-04-13 LAB — CD4/CD8 (T-HELPER/T-SUPPRESSOR CELL)
CD4 % Helper T Cell: 36
CD4 Count: 504
CD8 % Suppressor T Cell: 33.2
CD8 T Cell Abs: 465

## 2021-04-17 LAB — HIV-1 RNA QUANT-NO REFLEX-BLD: HIV 1 RNA Quant: <40

## 2021-04-19 ENCOUNTER — Encounter: Payer: Self-pay | Admitting: *Deleted

## 2021-05-02 ENCOUNTER — Encounter (INDEPENDENT_AMBULATORY_CARE_PROVIDER_SITE_OTHER): Payer: Self-pay | Admitting: *Deleted

## 2021-05-02 ENCOUNTER — Other Ambulatory Visit: Payer: Self-pay

## 2021-05-02 VITALS — BP 148/79 | HR 92 | Temp 98.6°F | Wt 234.5 lb

## 2021-05-02 DIAGNOSIS — Z006 Encounter for examination for normal comparison and control in clinical research program: Secondary | ICD-10-CM

## 2021-05-02 NOTE — Research (Signed)
Monique [name] was here for her week 4 study visit for A5391. She says her HCTZ has been making her sick on her stomach so she hasn't been taking it the past week or so. She has put on some weight from last visit 8lbs from a month ago, so question whether she is holding some fluid. She is going to reach out to Internal medicine who prescribed the BP meds and get back in to see them. She denies any other problems and will be returning in 8 weeks for the next study visit.

## 2021-05-03 LAB — COMPREHENSIVE METABOLIC PANEL
AG Ratio: 1.2 (calc) (ref 1.0–2.5)
ALT: 13 U/L (ref 6–29)
AST: 18 U/L (ref 10–35)
Albumin: 4.1 g/dL (ref 3.6–5.1)
Alkaline phosphatase (APISO): 72 U/L (ref 37–153)
BUN: 16 mg/dL (ref 7–25)
CO2: 25 mmol/L (ref 20–32)
Calcium: 9.5 mg/dL (ref 8.6–10.4)
Chloride: 105 mmol/L (ref 98–110)
Creat: 0.75 mg/dL (ref 0.50–1.03)
Globulin: 3.3 g/dL (calc) (ref 1.9–3.7)
Glucose, Bld: 89 mg/dL (ref 65–99)
Potassium: 4.2 mmol/L (ref 3.5–5.3)
Sodium: 138 mmol/L (ref 135–146)
Total Bilirubin: 0.3 mg/dL (ref 0.2–1.2)
Total Protein: 7.4 g/dL (ref 6.1–8.1)

## 2021-05-03 LAB — BILIRUBIN, DIRECT: Bilirubin, Direct: 0.1 mg/dL (ref 0.0–0.2)

## 2021-05-03 LAB — PHOSPHORUS: Phosphorus: 3.3 mg/dL (ref 2.5–4.5)

## 2021-05-31 ENCOUNTER — Encounter: Payer: Self-pay | Admitting: Infectious Disease

## 2021-05-31 ENCOUNTER — Encounter: Payer: Self-pay | Admitting: *Deleted

## 2021-05-31 LAB — HIV RNA, QUANTITATIVE, PCR: HIV 1 RNA Quant: <40

## 2021-06-16 ENCOUNTER — Other Ambulatory Visit: Payer: Self-pay

## 2021-06-16 ENCOUNTER — Encounter (HOSPITAL_COMMUNITY): Payer: Self-pay

## 2021-06-16 ENCOUNTER — Ambulatory Visit (INDEPENDENT_AMBULATORY_CARE_PROVIDER_SITE_OTHER): Payer: Self-pay

## 2021-06-16 ENCOUNTER — Ambulatory Visit (HOSPITAL_COMMUNITY)
Admission: EM | Admit: 2021-06-16 | Discharge: 2021-06-16 | Disposition: A | Discharge status: Home / Self Care | Payer: Self-pay | Attending: Physician Assistant | Admitting: Physician Assistant

## 2021-06-16 DIAGNOSIS — M25571 Pain in right ankle and joints of right foot: Secondary | ICD-10-CM

## 2021-06-16 DIAGNOSIS — R059 Cough, unspecified: Secondary | ICD-10-CM

## 2021-06-16 DIAGNOSIS — J069 Acute upper respiratory infection, unspecified: Secondary | ICD-10-CM

## 2021-06-16 LAB — POC INFLUENZA A AND B ANTIGEN (URGENT CARE ONLY)
INFLUENZA A ANTIGEN, POC: NEGATIVE
INFLUENZA B ANTIGEN, POC: NEGATIVE

## 2021-06-16 MED ORDER — ALBUTEROL SULFATE HFA 108 (90 BASE) MCG/ACT IN AERS: 1.0000 | INHALATION_SPRAY | Freq: Four times a day (QID) | RESPIRATORY_TRACT | 0 refills | Status: DC | PRN

## 2021-06-16 NOTE — ED Provider Notes (Signed)
[facility]    CSN: [id number] Arrival date & time: 06/16/21  8638      History   Chief Complaint Chief Complaint  Patient presents with   Cough   Foot Pain    HPI Monique [name] is a 50 y.o. female.   Patient here today for evaluation of cough she has had for the last 3 days. She reports she is aware she has a low immune system due to medication, HIV and states they have currently been caring for her mother at home with pneumonia. She has not had fever that she is aware of. She has had some congestion and wheezing. She denies any vomiting or diarrhea. She has not taken any medication for current symptoms but states she was previously prescribed albuterol inhaler but she no longer has this.   She also reports that she has had some pain in her right ankle. She is not aware of any injury but states she is having pain with any rotation in her lateral right malleolus. She does not report any treatment for symptoms.   The history is provided by the patient.  Cough Associated symptoms: wheezing   Associated symptoms: no chills, no ear pain, no eye discharge, no fever, no shortness of breath and no sore throat   Foot Pain Pertinent negatives include no abdominal pain and no shortness of breath.   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   Lower extremity edema 01/12/2015   Lung abscess (HCC)    Morbid obesity (HCC)    Pneumonia    TB (tuberculosis)    Tonsillitis    Viral URI 09/16/2017   Weight gain 10/27/2018    Patient Active Problem List   Diagnosis Date Noted   Groin pain 03/07/2021   Fatigue 03/07/2021   Screening for cervical cancer 04/24/2020   Encounter for screening mammogram for malignant  neoplasm of breast 04/24/2020   Morbid obesity (HCC)    Hypertension    Sinusitis 09/10/2019   Seasonal allergies 01/26/2019   Weight gain 10/27/2018   Myalgia 07/04/2018   Healthcare maintenance 05/19/2018   Exertional chest pain 05/13/2017   Nausea 02/06/2017   Obese 08/10/2015   Essential hypertension 01/12/2015   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      Home Medications    Prior to Admission medications   Medication Sig Start Date End Date Taking? Authorizing Provider  albuterol (VENTOLIN HFA) 108 (90 Base) MCG/ACT inhaler Inhale 1-2 puffs into the lungs every 6 (six) hours as needed for wheezing or shortness of breath. 06/16/21  Yes Tomi Bamberger, PA-C  acetaminophen (TYLENOL) 500 MG tablet Take 2 tablets (1,000 mg total) by mouth every 8 (eight) hours as needed for mild pain or fever. Patient not taking: Reported on 03/07/2021 07/04/18   Dorrell, Cathleen Corti, MD  amLODipine (NORVASC) 10 MG tablet TAKE 1 TABLET(10 MG) BY MOUTH DAILY 10/28/20   Daiva Eves, Lisette Grinder, MD  benzonatate (TESSALON) 100 MG capsule Take 1 capsule by mouth every 8 (eight) hours for cough. Patient not taking: Reported on 03/07/2021 07/27/20   Mardella Layman, MD  bictegravir-emtricitabine-tenofovir AF (BIKTARVY) 50-200-25 MG TABS tablet Take 1 tablet by mouth daily. 03/28/21   Randall Hiss, MD  cetirizine (ZYRTEC ALLERGY) 10 MG tablet Take 1 tablet (10 mg total) by mouth daily. 12/24/19 12/23/20  [place]omfield, Karma Ganja D, DO  docusate sodium (COLACE) 100 MG capsule Take 1 capsule (100 mg  total) by mouth every 12 (twelve) hours. 1-2 pills daily Patient not taking: Reported on 03/07/2021 11/18/20   Rhys Martini, PA-C  fluticasone [facility]) 50 MCG/ACT nasal spray Place 2 sprays into both nostrils daily. 11/23/19 11/22/20  Randall Hiss, MD  hydrochlorothiazide (HYDRODIURIL) 25 MG tablet TAKE 1 TABLET(25 MG) BY MOUTH DAILY 10/28/20   Daiva Eves, Lisette Grinder, MD  loratadine (CLARITIN) 10 MG tablet Take 1 tablet (10 mg total) by mouth daily. Patient not taking: Reported on 03/07/2021 11/25/18   Lew Dawes, PA-C    Family History Family History  Problem Relation Age of Onset   Diabetes Sister    Hypertension Sister    Hypertension Mother    Hypertension Father     Social History Social History   Tobacco Use   Smoking status: Former    Types: Cigarettes    Quit date: 12/22/2013    Years since quitting: 7.4   Smokeless tobacco: Never  Vaping Use   Vaping Use: Never used  Substance Use Topics   Alcohol use: No    Alcohol/week: 0.0 standard drinks   Drug use: No     Allergies   Patient has no known allergies.   Review of Systems Review of Systems  Constitutional:  Negative for chills and fever.  HENT:  Positive for congestion and sinus pressure. Negative for ear pain and sore throat.   Eyes:  Negative for discharge and redness.  Respiratory:  Positive for cough and wheezing. Negative for shortness of breath.   Gastrointestinal:  Negative for abdominal pain, diarrhea, nausea and vomiting.  Musculoskeletal:  Positive for arthralgias.  Neurological:  Negative for numbness.    Physical Exam Triage Vital Signs ED Triage Vitals  Enc Vitals Group     BP 06/16/21 0907 139/89     Pulse Rate 06/16/21 0907 92     Resp 06/16/21 0907 17     Temp 06/16/21 0907 98.3 F (36.8 C)     Temp Source 06/16/21 0907 Oral     SpO2 06/16/21 0907 96 %     Weight --      Height --      Head Circumference --      Peak Flow --      Pain Score 06/16/21 0906 8     Pain  Loc --      Pain Edu? --      Excl. in GC? --    No data found.  Updated Vital Signs BP 139/89 (BP Location: Right Arm)   Pulse 92   Temp 98.3 F (36.8 C) (Oral)   Resp 17   LMP 06/13/2021 (Exact Date)   SpO2 96%   Physical Exam Vitals and nursing note reviewed.  Constitutional:      General: She is not in acute distress.    Appearance: Normal appearance. She is not  ill-appearing.  HENT:     Head: Normocephalic and atraumatic.     Nose: Congestion present.  Eyes:     Conjunctiva/sclera: Conjunctivae normal.  Cardiovascular:     Rate and Rhythm: Normal rate and regular rhythm.     Heart sounds: Normal heart sounds. No murmur heard. Pulmonary:     Effort: Pulmonary effort is normal. No respiratory distress.     Breath sounds: Wheezing (diffuse) present. No rhonchi or rales.  Musculoskeletal:     Comments: Full ROM of right ankle with some pain with rotation, mild TTP diffusely to lateral malleolus  Skin:    General: Skin is warm and dry.  Neurological:     Mental Status: She is alert.  Psychiatric:        Mood and Affect: Mood normal.        Thought Content: Thought content normal.     UC Treatments / Results  Labs (all labs ordered are listed, but only abnormal results are displayed) Labs Reviewed  POC INFLUENZA A AND B ANTIGEN (URGENT CARE ONLY)    EKG   Radiology DG Chest 2 View  Result Date: 06/16/2021 CLINICAL DATA:  Cough. EXAM: CHEST - 2 VIEW COMPARISON:  September 25, 2018. FINDINGS: Stable cardiomegaly. Stable left upper lobe scarring. No acute pulmonary disease is noted. Bony thorax is unremarkable. IMPRESSION: No active cardiopulmonary disease. Electronically Signed   By: Lupita Raider M.D.   On: 06/16/2021 09:54   DG Ankle Complete Right  Result Date: 06/16/2021 CLINICAL DATA:  Acute ankle pain without known injury. EXAM: RIGHT ANKLE - COMPLETE 3+ VIEW COMPARISON:  None. FINDINGS: There is no evidence of acute fracture, dislocation, or joint  effusion. There is no evidence of arthropathy. Deformity of distal right fibula is noted consistent with old healed fracture. Soft tissues are unremarkable. IMPRESSION: No acute abnormality seen. Electronically Signed   By: Lupita Raider M.D.   On: 06/16/2021 09:55    Procedures Procedures (including critical care time)  Medications Ordered in UC Medications - No data to display  Initial Impression / Assessment and Plan / UC Course  I have reviewed the triage vital signs and the nursing notes.  Pertinent labs & imaging results that were available during my care of the patient were reviewed by me and considered in my medical decision making (see chart for details).  X-rays without concerning findings recommend she use symptomatic treatment if needed, and follow-up with any further concerns.  Albuterol inhaler prescribed given wheezing on exam.  Final Clinical Impressions(s) / UC Diagnoses   Final diagnoses:  Viral upper respiratory tract infection  Acute right ankle pain     Discharge Instructions      Xrays are normal. Recommend symptomatic treatment, and will send in albuterol inhaler. Recommend follow up with any further concerns or if symptoms fail to improve.      ED Prescriptions     Medication Sig Dispense Auth. Provider   albuterol (VENTOLIN HFA) 108 (90 Base) MCG/ACT inhaler Inhale 1-2 puffs into the lungs every 6 (six) hours as needed for wheezing or shortness of breath. 8 g Tomi Bamberger, PA-C      PDMP not reviewed this encounter.   Tomi Bamberger, PA-C 06/16/21 1042

## 2021-06-16 NOTE — Discharge Instructions (Signed)
Xrays are normal. Recommend symptomatic treatment, and will send in albuterol inhaler. Recommend follow up with any further concerns or if symptoms fail to improve.

## 2021-06-16 NOTE — ED Triage Notes (Signed)
Pt presents with a cough and wheezing x 3 days. Pt states she thinks she twisted her R foot. States she has body aches.

## 2021-06-27 ENCOUNTER — Other Ambulatory Visit: Payer: Self-pay

## 2021-06-27 ENCOUNTER — Encounter (INDEPENDENT_AMBULATORY_CARE_PROVIDER_SITE_OTHER): Payer: Self-pay | Admitting: *Deleted

## 2021-06-27 VITALS — BP 142/90 | HR 83 | Temp 98.3°F | Wt 243.3 lb

## 2021-06-27 DIAGNOSIS — Z006 Encounter for examination for normal comparison and control in clinical research program: Secondary | ICD-10-CM

## 2021-06-27 NOTE — Research (Signed)
Monique [name] was here for her week 12 A5391 visit. She said she was seen at urgent care over a week ago for cough and rt ankle pain, which she says have both resolved. She is very sleepy here and says she has been taking care of her sick mother and has to get up frequently at night with her. She also reports chronic fatigue and has gained 9 pounds in 2 months and is concerned about holding fluid. She had stopped her hydrochlorothiazide because she said it made her nauseated. I did tell her to start it back and let IM know about these issues when she sees them next week. Her BP was 142/90 and she said she had not taken her amlodipine today, which she usually takes in the morning. She will be returning for research in 12 weeks.

## 2021-06-28 LAB — COMPREHENSIVE METABOLIC PANEL
AG Ratio: 1.2 (calc) (ref 1.0–2.5)
ALT: 20 U/L (ref 6–29)
AST: 12 U/L (ref 10–35)
Albumin: 4.1 g/dL (ref 3.6–5.1)
Alkaline phosphatase (APISO): 78 U/L (ref 37–153)
BUN: 10 mg/dL (ref 7–25)
CO2: 25 mmol/L (ref 20–32)
Calcium: 9.5 mg/dL (ref 8.6–10.4)
Chloride: 103 mmol/L (ref 98–110)
Creat: 0.66 mg/dL (ref 0.50–1.03)
Globulin: 3.3 g/dL (calc) (ref 1.9–3.7)
Glucose, Bld: 75 mg/dL (ref 65–99)
Potassium: 4 mmol/L (ref 3.5–5.3)
Sodium: 137 mmol/L (ref 135–146)
Total Bilirubin: 0.3 mg/dL (ref 0.2–1.2)
Total Protein: 7.4 g/dL (ref 6.1–8.1)

## 2021-06-28 LAB — PHOSPHORUS: Phosphorus: 3.9 mg/dL (ref 2.5–4.5)

## 2021-06-28 LAB — BILIRUBIN, DIRECT: Bilirubin, Direct: 0 mg/dL (ref 0.0–0.2)

## 2021-07-03 ENCOUNTER — Encounter: Payer: Self-pay | Admitting: Internal Medicine

## 2021-07-06 ENCOUNTER — Ambulatory Visit (HOSPITAL_COMMUNITY)
Admission: RE | Admit: 2021-07-06 | Discharge: 2021-07-06 | Disposition: A | Discharge status: Home / Self Care | Payer: Self-pay | Source: Ambulatory Visit | Attending: Internal Medicine | Admitting: Internal Medicine

## 2021-07-06 ENCOUNTER — Encounter: Payer: Self-pay | Admitting: Internal Medicine

## 2021-07-06 ENCOUNTER — Ambulatory Visit: Payer: Self-pay | Admitting: Internal Medicine

## 2021-07-06 ENCOUNTER — Other Ambulatory Visit: Payer: Self-pay

## 2021-07-06 VITALS — BP 145/84 | HR 87 | Temp 98.4°F | Ht 62.0 in | Wt 246.0 lb

## 2021-07-06 DIAGNOSIS — R200 Anesthesia of skin: Secondary | ICD-10-CM

## 2021-07-06 DIAGNOSIS — I10 Essential (primary) hypertension: Secondary | ICD-10-CM

## 2021-07-06 DIAGNOSIS — J4530 Mild persistent asthma, uncomplicated: Secondary | ICD-10-CM

## 2021-07-06 DIAGNOSIS — R87810 Cervical high risk human papillomavirus (HPV) DNA test positive: Secondary | ICD-10-CM

## 2021-07-06 DIAGNOSIS — E785 Hyperlipidemia, unspecified: Secondary | ICD-10-CM | POA: Insufficient documentation

## 2021-07-06 DIAGNOSIS — Z23 Encounter for immunization: Secondary | ICD-10-CM

## 2021-07-06 DIAGNOSIS — E782 Mixed hyperlipidemia: Secondary | ICD-10-CM

## 2021-07-06 DIAGNOSIS — Z1211 Encounter for screening for malignant neoplasm of colon: Secondary | ICD-10-CM

## 2021-07-06 DIAGNOSIS — R079 Chest pain, unspecified: Secondary | ICD-10-CM

## 2021-07-06 DIAGNOSIS — R0789 Other chest pain: Secondary | ICD-10-CM | POA: Insufficient documentation

## 2021-07-06 DIAGNOSIS — R87610 Atypical squamous cells of undetermined significance on cytologic smear of cervix (ASC-US): Secondary | ICD-10-CM

## 2021-07-06 MED ORDER — ASPIRIN EC 81 MG PO TBEC: 81.0000 mg | DELAYED_RELEASE_TABLET | Freq: Every day | ORAL | 2 refills | Status: DC

## 2021-07-06 MED ORDER — ATORVASTATIN CALCIUM 20 MG PO TABS: 20.0000 mg | ORAL_TABLET | Freq: Every day | ORAL | 2 refills | Status: DC

## 2021-07-06 NOTE — Assessment & Plan Note (Addendum)
The 10-year ASCVD risk score (Arnett DK, et al., 2019) is: 8.9%   Values used to calculate the score:     Age: 50 years     Sex: Female     Is Non-Hispanic African American: Yes     Diabetic: No     Tobacco smoker: No     Systolic Blood Pressure: 145 mmHg     Is BP treated: Yes     HDL Cholesterol: 38 mg/dL     Total Cholesterol: 188 mg/dL   Given ongoing exertional chest pain concerning for stable angina, we will go ahead and start a statin today.  -Start atorvastatin 20 mg daily

## 2021-07-06 NOTE — Patient Instructions (Addendum)
It was nice seeing you today! Thank you for choosing [facility] for your Primary Care.    Today we talked about:   Your blood pressure improved when we re-checked it! Continue with your current medications: Amlodipine and Hydrochlorothiazide   The chest pain you are experiencing is concerning. It may be an early sign of heart disease. We have referred you to the heart doctors. In addition, I recommend starting daily baby Aspirin and cholesterol medication (Atorvastatin). I have sent these to the pharmacy.   I have sent in a referral to OBGYN for the abnormal pap smear from 2021.   I have sent in a referral to the GI doctors for a colonoscopy   If the numbness in your fingers does not improve within 2 weeks or gets worse, please call to make an appointment. We may need to get some blood work in that case.

## 2021-07-06 NOTE — Progress Notes (Addendum)
   CC: HTN  HPI:  Monique [name] is a 50 y.o. with a PMHx as listed below who presents to the clinic for HTN.   Please see the Encounters tab for problem-based Assessment & Plan regarding status of patient's acute and chronic conditions.  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  Respiratory:  Positive for shortness of breath. Negative for cough.   Cardiovascular:  Positive for chest pain. Negative for palpitations and leg swelling.  Gastrointestinal:  Negative for diarrhea, nausea and vomiting.  Musculoskeletal:  Negative for falls and myalgias.  Neurological:  Negative for dizziness and headaches.   Physical Exam:  Vitals:   07/06/21 0900  BP: (!) 145/84  Pulse: 87  Temp: 98.4 F (36.9 C)  TempSrc: Oral  SpO2: 98%  Weight: 246 lb (111.6 kg)  Height: 5\' 2"  (1.575 m)   Physical Exam Vitals and nursing note reviewed.  Constitutional:      General: She is not in acute distress.    Appearance: She is obese.  HENT:     Head: Normocephalic and atraumatic.     Mouth/Throat:     Mouth: Mucous membranes are moist.     Pharynx: Oropharynx is clear.  Eyes:     Extraocular Movements: Extraocular movements intact.     Conjunctiva/sclera: Conjunctivae normal.     Pupils: Pupils are equal, round, and reactive to light.  Cardiovascular:     Rate and Rhythm: Normal rate and regular rhythm.     Heart sounds: No murmur heard. Pulmonary:     Effort:  Pulmonary effort is normal. No respiratory distress.     Breath sounds: Normal breath sounds. No wheezing, rhonchi or rales.  Abdominal:     General: Bowel sounds are normal.     Palpations: Abdomen is soft.  Musculoskeletal:     Right lower leg: No edema.     Left lower leg: No edema.  Neurological:     General: No focal deficit present.     Mental Status: She is alert and oriented to person, place, and time.  Psychiatric:        Mood and Affect: Mood normal.        Behavior: Behavior normal.    Assessment & Plan:   See Encounters Tab for problem based charting.  Patient discussed with Dr. 

## 2021-07-10 DIAGNOSIS — J45909 Unspecified asthma, uncomplicated: Secondary | ICD-10-CM | POA: Insufficient documentation

## 2021-07-10 DIAGNOSIS — R87610 Atypical squamous cells of undetermined significance on cytologic smear of cervix (ASC-US): Secondary | ICD-10-CM | POA: Insufficient documentation

## 2021-07-10 DIAGNOSIS — R200 Anesthesia of skin: Secondary | ICD-10-CM | POA: Insufficient documentation

## 2021-07-10 DIAGNOSIS — R87810 Cervical high risk human papillomavirus (HPV) DNA test positive: Secondary | ICD-10-CM | POA: Insufficient documentation

## 2021-07-10 NOTE — Assessment & Plan Note (Signed)
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  She has not followed up with OB/GYN.  We will send a referral today to OB/GYN for consideration of colposcopy.  -Referral to OB/GYN placed

## 2021-07-10 NOTE — Assessment & Plan Note (Signed)
BP: (!) 145/84  Monique [name] states that she is currently taking the amlodipine HCTZ daily without any difficulty.  Assessment/plan: Blood pressure initially elevated on arrival, however improved on repeat.  No medication changes made today.  - Continue amlodipine and HCTZ daily - If additional blood pressure control is needed in the future, consider therapy

## 2021-07-10 NOTE — Assessment & Plan Note (Signed)
Monique [name] states she has a longtime history of chest pain whenever she is exerting herself, such as walking.  The chest pain does not occur each time she exerts herself but frequently.  Pain relieved with rest.  She denies any dizziness, arm or neck pain at the time.  She does endorse some shortness of breath when walking as well, but states she has a history of asthma in the past.  She has never followed up with cardiology before.  Assessment/plan: EKG obtained today that is sinus rhythm with no concerning ST or T wave changes and does not show any changes compared to prior.  This is a long-term problem for which patient was referred to cardiology in 2019 but never followed up. I am concerned that this is most likely stable angina.  We will start a statin and aspirin today in addition to placing a referral to cardiology.  Emphasized strongly to Monique [name] to follow-up with cardiology as this is concerning for existing heart disease and she expressed understanding.  - Start atorvastatin 20 mg daily - Start aspirin 81 mg daily - Referral to cardiology placed

## 2021-07-10 NOTE — Assessment & Plan Note (Signed)
Monique [name] states she has a longtime history of mild intermittent asthma for which she did not require any type of inhaler use for many years.  She experienced of viral upper respiratory infection in October that seems to have triggered her asthma though.  Since then, she has been requiring intermittent albuterol use whenever exerting herself.  She has not experienced any shortness of breath at rest or wheezing.  Assessment/plan: Consistent with prior mild intermittent asthma now presenting as mild persistent asthma.  No prior PFTs in chart.  - Continue albuterol as needed - Consider maintenance inhaler in the future.  Counseled Monique [name] to contact her clinic once she has insurance established so we can order PFTs and maintenance inhalers at that time.

## 2021-07-10 NOTE — Progress Notes (Signed)
Internal Medicine Clinic Attending  Case discussed with Dr. Basaraba  At the time of the visit.  We reviewed the resident's history and exam and pertinent patient test results.  I agree with the assessment, diagnosis, and plan of care documented in the resident's note.  

## 2021-07-10 NOTE — Assessment & Plan Note (Signed)
Monique [name] states that for several days now, she has been experiencing numbness in her second through fifth digits in her right hand only.  She denies any neck pain, back pain.  She denies any numbness or tingling in her left arm or right arm.  She denies any focal weakness.  She states that the symptoms have occurred in the past that self resolved.  Assessment/plan: Given acuity of symptoms, suspect potentially carpal tunnel syndrome, however unusual that it would involve the fourth and fifth digits.  On examination, there were no focal abnormalities with sensation intact.  Negative Phalen's and Tennille sign.    -Recommended patient follow-up in 2 weeks if symptoms do not improve.  At that point would consider further testing with TSH and B12.

## 2021-07-31 ENCOUNTER — Other Ambulatory Visit: Payer: Self-pay

## 2021-07-31 ENCOUNTER — Telehealth: Payer: Self-pay

## 2021-07-31 DIAGNOSIS — B20 Human immunodeficiency virus [HIV] disease: Secondary | ICD-10-CM

## 2021-07-31 MED ORDER — BIKTARVY 50-200-25 MG PO TABS: 1.0000 | ORAL_TABLET | Freq: Every day | ORAL | 2 refills | Status: DC

## 2021-07-31 NOTE — Telephone Encounter (Signed)
Telephoned patient at home number. Mailbox full, unable to leave a message from [facility]. 

## 2021-08-31 ENCOUNTER — Telehealth: Payer: Self-pay

## 2021-08-31 NOTE — Telephone Encounter (Signed)
Telephoned patient, patient has Friday insurance plan. Patient ineligible for [facility].

## 2021-09-12 ENCOUNTER — Encounter: Payer: Self-pay | Admitting: Internal Medicine

## 2021-09-18 ENCOUNTER — Other Ambulatory Visit: Payer: Self-pay

## 2021-09-18 ENCOUNTER — Encounter (INDEPENDENT_AMBULATORY_CARE_PROVIDER_SITE_OTHER): Payer: Self-pay | Admitting: *Deleted

## 2021-09-18 VITALS — BP 134/89 | HR 77 | Temp 97.8°F | Wt 247.5 lb

## 2021-09-18 DIAGNOSIS — Z006 Encounter for examination for normal comparison and control in clinical research program: Secondary | ICD-10-CM

## 2021-09-18 LAB — LIPID PANEL
Cholesterol: 228 mg/dL — ABNORMAL HIGH (ref <200)
HDL: 39 mg/dL — ABNORMAL LOW (ref >=50)
LDL Cholesterol (Calc): 170 mg/dL (calc) — ABNORMAL HIGH
Non-HDL Cholesterol (Calc): 189 mg/dL (calc) — ABNORMAL HIGH (ref <130)
Total CHOL/HDL Ratio: 5.8 (calc) — ABNORMAL HIGH (ref <5.0)
Triglycerides: 82 mg/dL (ref <150)

## 2021-09-18 LAB — COMPREHENSIVE METABOLIC PANEL
AG Ratio: 1.3 (calc) (ref 1.0–2.5)
ALT: 20 U/L (ref 6–29)
AST: 18 U/L (ref 10–35)
Albumin: 4.4 g/dL (ref 3.6–5.1)
Alkaline phosphatase (APISO): 78 U/L (ref 37–153)
BUN: 10 mg/dL (ref 7–25)
CO2: 32 mmol/L (ref 20–32)
Calcium: 9.7 mg/dL (ref 8.6–10.4)
Chloride: 100 mmol/L (ref 98–110)
Creat: 0.76 mg/dL (ref 0.50–1.03)
Globulin: 3.4 g/dL (calc) (ref 1.9–3.7)
Glucose, Bld: 89 mg/dL (ref 65–99)
Potassium: 3.5 mmol/L (ref 3.5–5.3)
Sodium: 137 mmol/L (ref 135–146)
Total Bilirubin: 0.4 mg/dL (ref 0.2–1.2)
Total Protein: 7.8 g/dL (ref 6.1–8.1)

## 2021-09-18 LAB — BILIRUBIN, DIRECT: Bilirubin, Direct: 0.1 mg/dL (ref 0.0–0.2)

## 2021-09-18 LAB — PHOSPHORUS: Phosphorus: 3.6 mg/dL (ref 2.5–4.5)

## 2021-09-18 NOTE — Research (Signed)
Monique [name] was here for her week 24 visit for A5391. She denies any new medications, but there was a note in her chart by PCP who had prescribed Lipitor and ASA, which she never started. She said she didn't realize that she needed it. She said she will call the pharmacy to fill it and get it tomorrow. She has put on a few more pounds and we did discuss diet and exercise. She says she doesn't walk any but will start. I gave her a healthy heart diet guide.  She does endorse some numbness and tingling in her fingertips rt hand. Continues to have fatigue and SOb with exertion. Denies chest pain and says it hasn't occurred in a while. She will return in 3 months for the next study visit.

## 2021-09-19 LAB — URINALYSIS, ROUTINE W REFLEX MICROSCOPIC
Bacteria, UA: NONE SEEN /HPF
Bilirubin Urine: NEGATIVE
Glucose, UA: NEGATIVE
Hgb urine dipstick: NEGATIVE
Hyaline Cast: NONE SEEN /LPF
Ketones, ur: NEGATIVE
Nitrite: NEGATIVE
Protein, ur: NEGATIVE
RBC / HPF: NONE SEEN /HPF (ref 0–2)
Specific Gravity, Urine: 1.012 (ref 1.001–1.035)
pH: 5.5 (ref 5.0–8.0)

## 2021-09-20 ENCOUNTER — Encounter: Payer: Self-pay | Admitting: Internal Medicine

## 2021-09-25 ENCOUNTER — Encounter: Payer: Self-pay | Admitting: Internal Medicine

## 2021-10-02 ENCOUNTER — Encounter: Payer: Self-pay | Admitting: *Deleted

## 2021-10-02 LAB — CD4/CD8 (T-HELPER/T-SUPPRESSOR CELL)
CD4 % Helper T Cell: 42.1
CD4 Count: 800
CD8 % Suppressor T Cell: 35.2
CD8 T Cell Abs: 669

## 2021-10-17 ENCOUNTER — Encounter: Payer: Self-pay | Admitting: Internal Medicine

## 2021-10-19 ENCOUNTER — Encounter: Payer: Self-pay | Admitting: Internal Medicine

## 2021-10-24 ENCOUNTER — Other Ambulatory Visit: Payer: Self-pay

## 2021-10-24 DIAGNOSIS — B20 Human immunodeficiency virus [HIV] disease: Secondary | ICD-10-CM

## 2021-10-24 MED ORDER — BIKTARVY 50-200-25 MG PO TABS: 1.0000 | ORAL_TABLET | Freq: Every day | ORAL | 2 refills | Status: DC

## 2021-11-11 ENCOUNTER — Encounter (HOSPITAL_COMMUNITY): Payer: Self-pay | Admitting: Emergency Medicine

## 2021-11-11 ENCOUNTER — Ambulatory Visit (HOSPITAL_COMMUNITY)
Admission: EM | Admit: 2021-11-11 | Discharge: 2021-11-11 | Disposition: A | Discharge status: Home / Self Care | Payer: 59 | Attending: Emergency Medicine | Admitting: Emergency Medicine

## 2021-11-11 ENCOUNTER — Ambulatory Visit (INDEPENDENT_AMBULATORY_CARE_PROVIDER_SITE_OTHER): Payer: 59

## 2021-11-11 DIAGNOSIS — J4521 Mild intermittent asthma with (acute) exacerbation: Secondary | ICD-10-CM

## 2021-11-11 DIAGNOSIS — R0602 Shortness of breath: Secondary | ICD-10-CM

## 2021-11-11 DIAGNOSIS — B20 Human immunodeficiency virus [HIV] disease: Secondary | ICD-10-CM

## 2021-11-11 MED ORDER — ALBUTEROL SULFATE HFA 108 (90 BASE) MCG/ACT IN AERS: 1.0000 | INHALATION_SPRAY | Freq: Four times a day (QID) | RESPIRATORY_TRACT | 1 refills | Status: DC | PRN

## 2021-11-11 MED ORDER — PREDNISONE 10 MG PO TABS: 20.0000 mg | ORAL_TABLET | Freq: Every day | ORAL | 0 refills | Status: AC

## 2021-11-11 MED ORDER — AZITHROMYCIN 250 MG PO TABS: 250.0000 mg | ORAL_TABLET | Freq: Every day | ORAL | 0 refills | Status: DC

## 2021-11-11 NOTE — Discharge Instructions (Addendum)
Albuterol was prescribed/take as directed ?Prednisone was prescribed for wheezing ?Azithromycin was prescribed ?Follow-up with PCP ?Return or go to ED if you develop any new or worsening of your symptoms ?

## 2021-11-11 NOTE — ED Triage Notes (Signed)
Pt reports for past couple days having SOB with exertion. Reports legs will feel weak.  ?

## 2021-11-11 NOTE — ED Provider Notes (Signed)
?[facility] ? ? ?[id number] ?11/11/21 Arrival Time: [id number] ? ? ?Chief Complaint  ?Patient presents with  ? Shortness of Breath  ? ? ?SUBJECTIVE: ?History from: patient. ? ?Monique [name] is a 51 y.o. female with history of asthma, HIV  presented to the urgent care for complaint of shortness of breath and weakness for the past few days.  Denies sick exposure to COVID, flu or strep.  Denies recent travel.  Has tried albuterol without relief.  Denies any alleviating or aggravating factors.  Denies previous symptoms in the past.   Denies fever, chills, fatigue, sinus pain, rhinorrhea, sore throat, wheezing, chest pain, nausea, changes in bowel or bladder habits.   ? ? ?ROS: As per HPI.  All other pertinent ROS negative.    ? ?Past Medical History:  ?Diagnosis Date  ? COVID-19 virus infection 11/23/2019  ? Exertional chest pain 05/13/2017  ? Fatigue 03/07/2021  ? Groin pain 03/07/2021  ? HIV (human immunodeficiency virus infection) ([place])   ? Hypertension   ? 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 Lower extremity edema 01/12/2015  ? Lung abscess ([place])   ? Morbid obesity ([place])   ? Pneumonia   ? TB (tuberculosis)   ? Tonsillitis   ? Viral URI 09/16/2017  ? Weight gain 10/27/2018  ? ?Past Surgical History:  ?Procedure Laterality Date  ? CESAREAN SECTION    ? VIDEO BRONCHOSCOPY Bilateral 04/13/2013  ? Procedure: VIDEO BRONCHOSCOPY WITHOUT FLUORO;  Surgeon: Brand Males, MD;  Location: [facility];  Service: Cardiopulmonary;  Laterality: Bilateral;  ? ?No Known Allergies ?No current facility-administered medications on file prior to encounter.  ? ?Current Outpatient Medications on File Prior to Encounter  ?Medication Sig Dispense Refill  ? amLODipine (NORVASC) 10 MG tablet TAKE 1 TABLET(10 MG) BY MOUTH DAILY 30 tablet 11  ? aspirin EC 81 MG tablet Take 1 tablet (81 mg total) by mouth daily. Swallow whole. 30 tablet 2  ? atorvastatin (LIPITOR) 20 MG tablet Take 1 tablet (20 mg  total) by mouth daily. 30 tablet 2  ? bictegravir-emtricitabine-tenofovir AF (BIKTARVY) 50-200-25 MG TABS tablet Take 1 tablet by mouth daily. 30 tablet 2  ? cetirizine (ZYRTEC ALLERGY) 10 MG tablet Take 1 tablet (10 mg total) by mouth daily. 30 tablet 2  ? fluticasone (FLONASE) 50 MCG/ACT nasal spray Place 2 sprays into both nostrils daily. 1 g 5  ? hydrochlorothiazide (HYDRODIURIL) 25 MG tablet TAKE 1 TABLET(25 MG) BY MOUTH DAILY 30 tablet 11  ? ?Social History  ? ?Socioeconomic History  ? Marital status: Single  ?  Spouse name: Not on file  ? Number of children: Not on file  ? Years of education: Not on file  ? Highest education level: Not on file  ?Occupational History  ? Not on file  ?Tobacco Use  ? Smoking status: Former  ?  Types: Cigarettes  ?  Quit date: 12/22/2013  ?  Years since quitting: 7.8  ? Smokeless tobacco: Never  ?Vaping Use  ? Vaping Use: Never used  ?Substance and Sexual Activity  ? Alcohol use: No  ?  Alcohol/week: 0.0 standard drinks  ? Drug use: No  ? Sexual activity: Not Currently  ?  Partners: Male  ?  Birth control/protection: None  ?Other Topics Concern  ? Not on file  ?Social History Narrative  ? Not on file  ? ?Social Determinants of Health  ? ?Financial Resource Strain: Not on file  ?Food Insecurity: Not on  file  ?Transportation Needs: Not on file  ?Physical Activity: Not on file  ?Stress: Not on file  ?Social Connections: Not on file  ?Intimate Partner Violence: Not on file  ? ?Family History  ?Problem Relation Age of Onset  ? Diabetes Sister   ? Hypertension Sister   ? Hypertension Mother   ? Hypertension Father   ? ? ?OBJECTIVE: ? ?Vitals:  ? 11/11/21 1411  ?BP: (!) 164/113  ?Pulse: 87  ?Resp: (!) 22  ?Temp: 97.8 ?F (36.6 ?C)  ?TempSrc: Oral  ?SpO2: 97%  ?  ?Physical Exam ?Vitals and nursing note reviewed.  ?Constitutional:   ?   General: She is not in acute distress. ?   Appearance: Normal appearance. She is normal weight. She is not ill-appearing, toxic-appearing or diaphoretic.   ?HENT:  ?   Head: Normocephalic.  ?   Right Ear: Tympanic membrane, ear canal and external ear normal. There is no impacted cerumen.  ?   Left Ear: Tympanic membrane, ear canal and external ear normal. There is no impacted cerumen.  ?   Nose: Nose normal. No congestion.  ?Cardiovascular:  ?   Rate and Rhythm: Normal rate and regular rhythm.  ?   Pulses: Normal pulses.  ?   Heart sounds: Normal heart sounds. No murmur heard. ?  No friction rub. No gallop.  ?Pulmonary:  ?   Effort: Pulmonary effort is normal. No respiratory distress.  ?   Breath sounds: No stridor. Wheezing present. No rhonchi or rales.  ?   Comments: Tachypnea ? ?Chest:  ?   Chest wall: No tenderness.  ?Neurological:  ?   Mental Status: She is alert and oriented to person, place, and time.  ?  ? ?LABS: ? ?No results found for this or any previous visit (from the past 24 hour(s)).  ? ?RADIOLOGY ? ?DG Chest 2 View ? ?Result Date: 11/11/2021 ?CLINICAL DATA:  Shortness of breath with exertion for the past few days. EXAM: CHEST - 2 VIEW COMPARISON:  Chest x-ray dated June 16, 2021. FINDINGS: Unchanged mild cardiomegaly. Normal pulmonary vascularity. Chronic pleuroparenchymal scarring in the peripheral left upper lobe. No focal consolidation, pleural effusion, or pneumothorax. No acute osseous abnormality. IMPRESSION: No active cardiopulmonary disease. Electronically Signed   By: Titus Dubin M.D.   On: 11/11/2021 14:49    ? ?Chest X-ray is negative  for acute cardiopulmonary disease.  I have reviewed the x-ray myself and the radiologist interpretation.  I am in agreement with the radiologist interpretation. ? ? ? ? ?ASSESSMENT & PLAN: ? ?1. Mild intermittent asthma with acute exacerbation   ? ? ?Meds ordered this encounter  ?Medications  ? albuterol (VENTOLIN HFA) 108 (90 Base) MCG/ACT inhaler  ?  Sig: Inhale 1-2 puffs into the lungs every 6 (six) hours as needed for wheezing or shortness of breath.  ?  Dispense:  18 g  ?  Refill:  1  ? azithromycin  (ZITHROMAX) 250 MG tablet  ?  Sig: Take 1 tablet (250 mg total) by mouth daily. Take first 2 tablets together, then 1 every day until finished.  ?  Dispense:  6 tablet  ?  Refill:  0  ? predniSONE (DELTASONE) 10 MG tablet  ?  Sig: Take 2 tablets (20 mg total) by mouth daily for 7 days.  ?  Dispense:  14 tablet  ?  Refill:  0  ? ? ?Discharge instructions  ? ?Albuterol was prescribed/take as directed ?Prednisone was prescribed for wheezing ?Azithromycin was  prescribed ?Follow-up with PCP ?Return or go to ED if you develop any new or worsening of your symptoms ? ?reviewed expectations re: course of current medical issues. Questions answered. ?Outlined signs and symptoms indicating need for more acute intervention. ?Patient verbalized understanding. ?After Visit Summary given. ? ? ? ? ? ?  ?  ?Emerson Monte, FNP ?11/11/21 1509 ? ?

## 2021-12-14 ENCOUNTER — Encounter: Payer: Self-pay | Admitting: *Deleted

## 2021-12-20 ENCOUNTER — Other Ambulatory Visit: Payer: Self-pay

## 2021-12-20 ENCOUNTER — Encounter: Payer: 59 | Admitting: *Deleted

## 2021-12-20 VITALS — BP 135/88 | HR 77 | Temp 97.5°F | Wt 258.4 lb

## 2021-12-20 DIAGNOSIS — Z006 Encounter for examination for normal comparison and control in clinical research program: Secondary | ICD-10-CM

## 2021-12-20 NOTE — Research (Signed)
Monique [name] seen today for her week 36 visit for [id number]. She verbalized concern regarding weight gain since starting biktarvy. Recently on prednisone for an asthma exacerbation which she states also increased her appetite and feels it caused more weight gain. She is scheduled to see Dr. Daiva Eves in May. Encouraged her to discuss these concerns with him at the visit. She does feel that she is back at her baseline for her asthma. Continues to have mild SOB on exertion. Using her albuterol inhaler as needed. No other complaints. Verbalized excellent adherence with her biktarvy. She will return in July for her next study visit.  ?

## 2021-12-27 ENCOUNTER — Ambulatory Visit (INDEPENDENT_AMBULATORY_CARE_PROVIDER_SITE_OTHER): Payer: Self-pay | Admitting: Internal Medicine

## 2021-12-27 ENCOUNTER — Ambulatory Visit: Payer: 59 | Admitting: Infectious Disease

## 2021-12-27 ENCOUNTER — Other Ambulatory Visit: Payer: Self-pay

## 2021-12-27 DIAGNOSIS — I10 Essential (primary) hypertension: Secondary | ICD-10-CM

## 2021-12-27 MED ORDER — AMLODIPINE BESYLATE 10 MG PO TABS: ORAL_TABLET | ORAL | 0 refills | Status: DC

## 2021-12-28 ENCOUNTER — Encounter: Payer: Self-pay | Admitting: Internal Medicine

## 2021-12-29 NOTE — Progress Notes (Signed)
No show

## 2022-01-01 ENCOUNTER — Ambulatory Visit: Payer: 59 | Admitting: Infectious Disease

## 2022-01-02 ENCOUNTER — Ambulatory Visit: Payer: 59 | Admitting: Infectious Disease

## 2022-01-02 ENCOUNTER — Telehealth: Payer: Self-pay

## 2022-01-02 NOTE — Telephone Encounter (Signed)
Called patient to see if she would be able to make it to today's appointment, no answer. Left HIPAA compliant voicemail requesting callback.   Tanisha Lutes D Amro Winebarger, RN  

## 2022-01-10 ENCOUNTER — Encounter (HOSPITAL_COMMUNITY): Payer: Self-pay

## 2022-01-10 ENCOUNTER — Ambulatory Visit (HOSPITAL_COMMUNITY)
Admission: EM | Admit: 2022-01-10 | Discharge: 2022-01-10 | Disposition: A | Discharge status: Home / Self Care | Payer: 59 | Attending: Internal Medicine | Admitting: Internal Medicine

## 2022-01-10 DIAGNOSIS — R1030 Lower abdominal pain, unspecified: Secondary | ICD-10-CM | POA: Diagnosis not present

## 2022-01-10 LAB — POCT URINALYSIS DIPSTICK, ED / UC
Bilirubin Urine: NEGATIVE
Glucose, UA: NEGATIVE mg/dL
Hgb urine dipstick: NEGATIVE
Ketones, ur: NEGATIVE mg/dL
Leukocytes,Ua: NEGATIVE
Nitrite: NEGATIVE
Protein, ur: 30 mg/dL — AB
Specific Gravity, Urine: 1.015 (ref 1.005–1.030)
Urobilinogen, UA: 0.2 mg/dL (ref 0.0–1.0)
pH: 5.5 (ref 5.0–8.0)

## 2022-01-10 LAB — POC URINE PREG, ED: Preg Test, Ur: NEGATIVE

## 2022-01-10 NOTE — ED Provider Notes (Signed)
?[facility] ? ? ? ?CSN: [id number] ?Arrival date & time: 01/10/22  1021 ? ? ?  ? ?History   ?Chief Complaint ?Chief Complaint  ?Patient presents with  ? Abdominal Pain  ? ? ?HPI ?Monique [name] is a 51 y.o. female.  ? ?Lower Abdominal Pain ?Started last night ?States pain is difficult to describe, but just feels like a discomfort ?She denies any nausea, vomiting ?She has been able to eat and drink normally, but states that she has not been drinking much water and is drinking more sodas ?No increase in pain with urination ?Denies any vaginal discharge ?No abnormal uterine bleeding or blood in her urine ?No fevers or chills ? ? ?Elevated BP ?On HCTZ and Norvasc, but didn't take this morning ?Denies new chest pain, difficulty breathing, numbness and tingling, changes in vision ? ? ?Past Medical History:  ?Diagnosis Date  ? COVID-19 virus infection 11/23/2019  ? Exertional chest pain 05/13/2017  ? Fatigue 03/07/2021  ? Groin pain 03/07/2021  ? HIV (human immunodeficiency virus infection) ([facility])   ? Hypertension   ? 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 Lower extremity edema 01/12/2015  ? Lung abscess ([facility])   ? Morbid obesity ([facility])   ? Pneumonia   ? TB (tuberculosis)   ? Tonsillitis   ? Viral URI 09/16/2017  ? Weight gain 10/27/2018  ? ? ?Patient Active Problem List  ? Diagnosis Date Noted  ? ASCUS with positive high risk HPV cervical 07/10/2021  ? Asthma 07/10/2021  ? Finger numbness 07/10/2021  ? Hyperlipidemia 07/06/2021  ? Screening for cervical cancer 04/24/2020  ? Encounter for screening mammogram for malignant neoplasm of breast 04/24/2020  ? Morbid obesity ([facility])   ? Seasonal allergies 01/26/2019  ? Healthcare maintenance 05/19/2018  ? Exertional chest pain 05/13/2017  ? Obese 08/10/2015  ? Essential hypertension 01/12/2015  ? 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 Human immunodeficiency virus (HIV) disease ([facility]) 04/10/2013   ? ? ?Past Surgical History:  ?Procedure Laterality Date  ? CESAREAN SECTION    ? VIDEO BRONCHOSCOPY Bilateral 04/13/2013  ? Procedure: VIDEO BRONCHOSCOPY WITHOUT FLUORO;  Surgeon: Kalman ShanMurali Ramaswamy, MD;  [facility];  Service: Cardiopulmonary;  Laterality: Bilateral;  ? ? ?OB History   ?No obstetric history on file. ?  ? ? ? ?Home Medications   ? ?Prior to Admission medications   ?Medication Sig Start Date End Date Taking? Authorizing Provider  ?albuterol (VENTOLIN HFA) 108 (90 Base) MCG/ACT inhaler Inhale 1-2 puffs into the lungs every 6 (six) hours as needed for wheezing or shortness of breath. 11/11/21   Avegno, Zachery DakinsKomlanvi S, FNP  ?amLODipine (NORVASC) 10 MG tablet TAKE 1 TABLET(10 MG) BY MOUTH DAILY 12/26/21   Daiva EvesVan Dam, Lisette Grinderornelius N, MD  ?aspirin EC 81 MG tablet Take 1 tablet (81 mg total) by mouth daily. Swallow whole. 07/06/21   Verdene LennertBasaraba, Iulia, MD  ?atorvastatin (LIPITOR) 20 MG tablet Take 1 tablet (20 mg total) by mouth daily. 07/06/21 07/06/22  Verdene LennertBasaraba, Iulia, MD  ?azithromycin (ZITHROMAX) 250 MG tablet Take 1 tablet (250 mg total) by mouth daily. Take first 2 tablets together, then 1 every day until finished. 11/11/21   Durward ParcelAvegno, Komlanvi S, FNP  ?bictegravir-emtricitabine-tenofovir AF (BIKTARVY) 50-200-25 MG TABS tablet Take 1 tablet by mouth daily. 10/24/21   Randall HissVan Dam, Cornelius N, MD  ?cetirizine (ZYRTEC ALLERGY) 10 MG tablet Take 1 tablet (10 mg total) by mouth daily. 12/24/19 12/23/20  [place], Carley D, DO  ?fluticasone (FLONASE) 50 MCG/ACT nasal spray Place 2 sprays into both nostrils daily. 11/23/19 11/22/20  Randall Hiss, MD  ?hydrochlorothiazide (HYDRODIURIL) 25 MG tablet TAKE 1 TABLET(25 MG) BY MOUTH DAILY 10/28/20   Daiva Eves, Lisette Grinder, MD  ? ? ?Family History ?Family History  ?Problem Relation Age of Onset  ? Diabetes Sister   ? Hypertension Sister   ? Hypertension Mother   ? Hypertension Father   ? ? ?Social History ?Social History  ? ?Tobacco Use  ? Smoking status: Former  ?  Types:  Cigarettes  ?  Quit date: 12/22/2013  ?  Years since quitting: 8.0  ? Smokeless tobacco: Never  ?Vaping Use  ? Vaping Use: Never used  ?Substance Use Topics  ? Alcohol use: No  ?  Alcohol/week: 0.0 standard drinks  ? Drug use: No  ? ? ? ?Allergies   ?Patient has no known allergies. ? ? ?Review of Systems ?Review of Systems  ?All other systems reviewed and are negative. ? ?Per HPI ?Physical Exam ?Triage Vital Signs ?ED Triage Vitals [01/10/22 1058]  ?Enc Vitals Group  ?   BP   ?   Pulse   ?   Resp   ?   Temp   ?   Temp src   ?   SpO2   ?   Weight   ?   Height   ?   Head Circumference   ?   Peak Flow   ?   Pain Score 6  ?   Pain Loc   ?   Pain Edu?   ?   Excl. in GC?   ? ?No data found. ? ?Updated Vital Signs ?BP (!) 163/111 (BP Location: Left Wrist)   Pulse 90   Temp 98.4 ?F (36.9 ?C) (Oral)   Resp 18   LMP 12/19/2021 (Approximate)   SpO2 98%  ? ?Visual Acuity ?Right Eye Distance:   ?Left Eye Distance:   ?Bilateral Distance:   ? ?Right Eye Near:   ?Left Eye Near:    ?Bilateral Near:    ? ?Physical Exam ?Constitutional:   ?   General: She is not in acute distress. ?   Appearance: She is well-developed. She is not ill-appearing.  ?HENT:  ?   Head: Normocephalic and atraumatic.  ?Cardiovascular:  ?   Rate and Rhythm: Normal rate and regular rhythm.  ?   Heart sounds: No murmur heard. ?Pulmonary:  ?   Effort: Pulmonary effort is normal. No respiratory distress.  ?   Breath sounds: Normal breath sounds.  ?Abdominal:  ?   General: Bowel sounds are normal.  ?   Palpations: Abdomen is soft.  ?   Tenderness: There is no abdominal tenderness.  ?   Comments: No CVA tenderness  ?Skin: ?   General: Skin is warm and dry.  ?Neurological:  ?   Mental Status: She is alert and oriented to person, place, and time.  ?Psychiatric:     ?   Mood and Affect: Mood normal.     ?   Behavior: Behavior normal.  ? ? ? ?UC Treatments / Results  ?Labs ?(all labs ordered are listed, but only abnormal results are displayed) ?Labs Reviewed  ?POCT  URINALYSIS DIPSTICK, ED / UC - Abnormal; Notable for the following components:  ?    Result Value  ? Protein, ur 30 (*)   ? All other components within normal limits  ?URINE CULTURE  ?POC  URINE PREG, ED  ? ? ?EKG ? ? ?Radiology ?No results found. ? ?Procedures ?Procedures (including critical care time) ? ?Medications Ordered in UC ?Medications - No data to display ? ?Initial Impression / Assessment and Plan / UC Course  ?I have reviewed the triage vital signs and the nursing notes. ? ?Pertinent labs & imaging results that were available during my care of the patient were reviewed by me and considered in my medical decision making (see chart for details). ? ?  ? ?Urine dipstick negative, sent for confirmatory culture.  POC pregnancy test negative.  Abdominal exam benign.  No vaginal concerns.  VSS.  Provided reassurance and return precautions, see AVS. ? ? ?Final Clinical Impressions(s) / UC Diagnoses  ? ?Final diagnoses:  ?Lower abdominal pain  ? ? ? ?Discharge Instructions   ? ?  ?Your urine does not show signs of infection currently, but we will send this for a culture to make sure.  If it does look like it is infected, someone will call you and treat with an antibiotic.  Increase your fluid intake and make sure you are drinking enough water during the day.  If your pain gets significantly worse, especially if you develop difficulty eating, vomiting, diarrhea, you should be seen by medical provider. ? ? ? ? ?ED Prescriptions   ?None ?  ? ?PDMP not reviewed this encounter. ?  ?Unknown Jim, DO ?01/10/22 1138 ? ?

## 2022-01-10 NOTE — ED Triage Notes (Signed)
Pt presents with lower abdominal pain starting yesterday. Wraps around to bilateral flanks. Unable to describe pain as sharp or cramping, says it is just pain. Denies urinary or vaginal changes. Last BM yesterday, was normal. No n/v/d.   ? ?Has had abd pain like this before and increased water for it to go away. Says water intake has been lower lately.  ?

## 2022-01-10 NOTE — Discharge Instructions (Signed)
Your urine does not show signs of infection currently, but we will send this for a culture to make sure.  If it does look like it is infected, someone will call you and treat with an antibiotic.  Increase your fluid intake and make sure you are drinking enough water during the day.  If your pain gets significantly worse, especially if you develop difficulty eating, vomiting, diarrhea, you should be seen by medical provider. ?

## 2022-01-11 LAB — URINE CULTURE: Culture: NO GROWTH

## 2022-01-16 ENCOUNTER — Other Ambulatory Visit: Payer: Self-pay | Admitting: Internal Medicine

## 2022-01-16 ENCOUNTER — Encounter: Payer: Self-pay | Admitting: Internal Medicine

## 2022-01-16 DIAGNOSIS — Z1231 Encounter for screening mammogram for malignant neoplasm of breast: Secondary | ICD-10-CM

## 2022-01-23 ENCOUNTER — Ambulatory Visit (HOSPITAL_COMMUNITY)
Admission: EM | Admit: 2022-01-23 | Discharge: 2022-01-23 | Disposition: A | Discharge status: Home / Self Care | Payer: 59 | Attending: Internal Medicine | Admitting: Internal Medicine

## 2022-01-23 ENCOUNTER — Encounter (HOSPITAL_COMMUNITY): Payer: Self-pay

## 2022-01-23 DIAGNOSIS — J069 Acute upper respiratory infection, unspecified: Secondary | ICD-10-CM

## 2022-01-23 MED ORDER — PROMETHAZINE-DM 6.25-15 MG/5ML PO SYRP: 5.0000 mL | ORAL_SOLUTION | Freq: Four times a day (QID) | ORAL | 0 refills | Status: DC | PRN

## 2022-01-23 MED ORDER — ACETAMINOPHEN 325 MG PO TABS
ORAL_TABLET | ORAL | Status: AC
  Filled 2022-01-23: qty 2

## 2022-01-23 MED ORDER — BENZONATATE 100 MG PO CAPS: 100.0000 mg | ORAL_CAPSULE | Freq: Three times a day (TID) | ORAL | 0 refills | Status: DC

## 2022-01-23 MED ORDER — ALBUTEROL SULFATE HFA 108 (90 BASE) MCG/ACT IN AERS: 1.0000 | INHALATION_SPRAY | Freq: Four times a day (QID) | RESPIRATORY_TRACT | 1 refills | Status: DC | PRN

## 2022-01-23 MED ORDER — PREDNISONE 20 MG PO TABS: 40.0000 mg | ORAL_TABLET | Freq: Every day | ORAL | 0 refills | Status: DC

## 2022-01-23 MED ORDER — ACETAMINOPHEN 325 MG PO TABS
650.0000 mg | ORAL_TABLET | Freq: Once | ORAL | Status: AC
  Administered 2022-01-23: 650 mg via ORAL

## 2022-01-23 NOTE — Discharge Instructions (Signed)
Today you are being treated for inflammation to your upper airways  Begin use of prednisone every morning with food for 5 days to help reduce inflammation   May use albuterol inhaler taking 2 puffs every 4 hours as needed to help calm shortness of breath and wheezing  You may use Tessalon pill every 8 hours as needed to help calm coughing  May use promethazine DM for coughing and additional comfort, be mindful this medication may make you drowsy  For worsening signs of breathing please go to the nearest emergency department for evaluation  In addition:  Maintaining adequate hydration may help to thin secretions and soothe the respiratory mucosa   Warm Liquids- Ingestion of warm liquids may have a soothing effect on the respiratory mucosa, increase the flow of nasal mucus, and loosen respiratory secretions, making them easier to remove  May try honey (2.5 to 5 mL [0.5 to 1 teaspoon]) can be given straight or diluted in liquid (juice). Corn syrup may be substituted if honey is not available.

## 2022-01-23 NOTE — ED Provider Notes (Signed)
[facility]    CSN: [id number] Arrival date & time: 01/23/22  1115      History   Chief Complaint Chief Complaint  Patient presents with   Cough    HPI Monique [name] is a 51 y.o. female.   Patient presents with fever, chills, rhinorrhea, nonproductive cough, shortness of breath with exertion and intermittent wheezing for 1 day.  Tolerating food and liquids.  No known sick contacts.  Has attempted use of Robitussin which has been minimally helpful.  History of asthma.  Denies nasal congestion, ear pain, headaches, sore throat, chest pain or tightness, abdominal pain, nausea, vomiting or diarrhea.   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   Lower extremity edema 01/12/2015   Lung abscess (HCC)    Morbid obesity (HCC)    Pneumonia    TB (tuberculosis)    Tonsillitis    Viral URI 09/16/2017   Weight gain 10/27/2018    Patient Active Problem List   Diagnosis Date Noted   ASCUS with positive high risk HPV cervical 07/10/2021   Asthma 07/10/2021   Finger numbness 07/10/2021   Hyperlipidemia 07/06/2021   Screening for cervical cancer 04/24/2020   Encounter for screening mammogram for malignant neoplasm of breast 04/24/2020   Morbid obesity (HCC)    Seasonal allergies 01/26/2019   Healthcare maintenance 05/19/2018   Exertional chest pain 05/13/2017   Obese 08/10/2015   Essential hypertension 01/12/2015   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      Home Medications    Prior to Admission medications   Medication Sig Start Date End Date Taking? Authorizing Provider  albuterol (VENTOLIN HFA) 108 (90 Base) MCG/ACT inhaler Inhale 1-2 puffs into the lungs every 6 (six) hours as needed for wheezing or shortness of breath. 11/11/21   Durward Parcel, FNP  amLODipine (NORVASC) 10 MG tablet TAKE 1 TABLET(10 MG) BY MOUTH DAILY 12/26/21   Daiva Eves, Lisette Grinder, MD  aspirin EC 81 MG tablet Take 1 tablet (81 mg total) by mouth daily. Swallow whole. 07/06/21   Verdene Lennert, MD  atorvastatin (LIPITOR) 20 MG tablet Take 1 tablet (20 mg total) by mouth daily. 07/06/21 07/06/22  Verdene Lennert, MD  bictegravir-emtricitabine-tenofovir AF (BIKTARVY) 50-200-25 MG TABS tablet Take 1 tablet by mouth daily. 10/24/21   Randall Hiss, MD  cetirizine (ZYRTEC ALLERGY) 10 MG tablet Take 1 tablet (10 mg total) by mouth daily. 12/24/19 12/23/20  Bloomfield, Carley D, DO  fluticasone (FLONASE) 50 MCG/ACT nasal spray Place 2 sprays into both nostrils daily. 11/23/19 11/22/20  Randall Hiss, MD  hydrochlorothiazide (HYDRODIURIL) 25 MG tablet TAKE 1 TABLET(25 MG) BY MOUTH DAILY 10/28/20   Daiva Eves, Lisette Grinder, MD    Family History Family History  Problem Relation Age of Onset   Diabetes Sister  Hypertension Sister    Hypertension Mother    Hypertension Father     Social History Social History   Tobacco Use   Smoking status: Former    Types: Cigarettes    Quit date: 12/22/2013    Years since quitting: 8.0   Smokeless tobacco: Never  Vaping Use   Vaping Use: Never used  Substance Use Topics   Alcohol use: No    Alcohol/week: 0.0 standard drinks   Drug use: No     Allergies   Patient has no known allergies.   Review of Systems Review of Systems  Constitutional:   Positive for chills and fever. Negative for activity change, appetite change, diaphoresis, fatigue and unexpected weight change.  HENT:  Positive for rhinorrhea. Negative for congestion, dental problem, drooling, ear discharge, ear pain, facial swelling, hearing loss, mouth sores, nosebleeds, postnasal drip, sinus pressure, sinus pain, sneezing, sore throat, tinnitus, trouble swallowing and voice change.   Respiratory:  Positive for cough, shortness of breath and wheezing. Negative for apnea, choking, chest tightness and stridor.   Gastrointestinal: Negative.   Skin: Negative.   Neurological: Negative.     Physical Exam Triage Vital Signs ED Triage Vitals [01/23/22 1144]  Enc Vitals Group     BP 138/87     Pulse Rate (!) 109     Resp (!) 22     Temp (!) 100.6 F (38.1 C)     Temp Source Oral     SpO2 96 %     Weight      Height      Head Circumference      Peak Flow      Pain Score 8     Pain Loc      Pain Edu?      Excl. in GC?    No data found.  Updated Vital Signs BP 138/87 (BP Location: Left Arm)   Pulse (!) 109   Temp (!) 100.6 F (38.1 C) (Oral)   Resp (!) 22   LMP 01/16/2022   SpO2 96%   Visual Acuity Right Eye Distance:   Left Eye Distance:   Bilateral Distance:    Right Eye Near:   Left Eye Near:    Bilateral Near:     Physical Exam Constitutional:      Appearance: Normal appearance.  HENT:     Head: Normocephalic.     Right Ear: Tympanic membrane, ear canal and external ear normal.     Left Ear: Tympanic membrane, ear canal and external ear normal.     Nose: Nose normal.     Mouth/Throat:     Mouth: Mucous membranes are moist.     Pharynx: Oropharynx is clear.  Eyes:     Extraocular Movements: Extraocular movements intact.  Cardiovascular:     Rate and Rhythm: Normal rate and regular rhythm.     Pulses: Normal pulses.     Heart sounds: Normal heart sounds.  Pulmonary:     Effort: Pulmonary effort is normal.     Breath sounds: Normal breath  sounds.     Comments: Dry cough witnessed. Musculoskeletal:     Cervical back: Normal range of motion and neck supple.  Skin:    General: Skin is warm and dry.  Neurological:     Mental Status: She is alert and oriented to person, place, and time. Mental status is at baseline.  Psychiatric:        Mood and Affect: Mood normal.  Behavior: Behavior normal.     UC Treatments / Results  Labs (all labs ordered are listed, but only abnormal results are displayed) Labs Reviewed - No data to display  EKG   Radiology No results found.  Procedures Procedures (including critical care time)  Medications Ordered in UC Medications  acetaminophen (TYLENOL) tablet 650 mg (650 mg Oral Given 01/23/22 1152)    Initial Impression / Assessment and Plan / UC Course  I have reviewed the triage vital signs and the nursing notes.  Pertinent labs & imaging results that were available during my care of the patient were reviewed by me and considered in my medical decision making (see chart for details).  Viral URI with cough  Fever of 100.6 with associated tachycardia noted in triage, Tylenol given in office, O2 saturation is 96% on room air and lungs are clear to auscultation, dry hacking cough witnessed during exam, suspicion for pneumonia, bronchitis or pneumothorax, will defer imaging today ,discussed findings with patient, etiology is symptoms are most likely viral with associated asthmatic flare, prednisone 40 mg burst, Tessalon and Promethazine DM prescribed for outpatient management, refill given on albuterol inhaler, may use additional over-the-counter medications as needed for supportive care, may follow-up with urgent care as needed Final Clinical Impressions(s) / UC Diagnoses   Final diagnoses:  None   Discharge Instructions   None    ED Prescriptions   None    PDMP not reviewed this encounter.   Valinda HoarWhite, Kashae Carstens R, NP 01/23/22 1259

## 2022-01-23 NOTE — ED Triage Notes (Signed)
Pt c/o cough, wheezing, chest congestion, and chills since yesterday. Took robitussin at 7am.

## 2022-02-19 ENCOUNTER — Encounter: Payer: 59 | Admitting: *Deleted

## 2022-02-19 ENCOUNTER — Encounter: Payer: Self-pay | Admitting: *Deleted

## 2022-02-21 ENCOUNTER — Other Ambulatory Visit: Payer: Self-pay

## 2022-02-21 DIAGNOSIS — B20 Human immunodeficiency virus [HIV] disease: Secondary | ICD-10-CM

## 2022-02-21 MED ORDER — BIKTARVY 50-200-25 MG PO TABS: 1.0000 | ORAL_TABLET | Freq: Every day | ORAL | 0 refills | Status: DC

## 2022-02-28 ENCOUNTER — Ambulatory Visit: Payer: 59 | Admitting: Family Medicine

## 2022-03-12 ENCOUNTER — Encounter (INDEPENDENT_AMBULATORY_CARE_PROVIDER_SITE_OTHER): Payer: Self-pay | Admitting: *Deleted

## 2022-03-12 ENCOUNTER — Other Ambulatory Visit: Payer: Self-pay

## 2022-03-12 VITALS — BP 162/113 | HR 75 | Temp 97.8°F | Wt 254.4 lb

## 2022-03-12 DIAGNOSIS — Z006 Encounter for examination for normal comparison and control in clinical research program: Secondary | ICD-10-CM

## 2022-03-12 NOTE — Addendum Note (Signed)
Addended by: Osvaldo Shipper on: 03/12/2022 12:35 PM   Modules accepted: Orders

## 2022-03-12 NOTE — Research (Signed)
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 BP elevated today 162/113. States that she has not taken her BP medication today. She did take it yesterday. Denied any headache, visual changes or chest pain. Rechecked BP after an additional 5 minute rest period. Remains elevated, 160/111. Seen/evaluated by Arvilla Meres, PA-C. Instructed patient to call today to schedule an appointment with her PCP and to go to the ED if she develops cardiac symptoms. Agreed and verbalized understanding. She is scheduled to see Dr. Daiva Eves in August.

## 2022-03-14 LAB — COMPREHENSIVE METABOLIC PANEL
AG Ratio: 1.2 (calc) (ref 1.0–2.5)
ALT: 14 U/L (ref 6–29)
AST: 14 U/L (ref 10–35)
Albumin: 4.1 g/dL (ref 3.6–5.1)
Alkaline phosphatase (APISO): 72 U/L (ref 37–153)
BUN: 15 mg/dL (ref 7–25)
CO2: 26 mmol/L (ref 20–32)
Calcium: 9.3 mg/dL (ref 8.6–10.4)
Chloride: 103 mmol/L (ref 98–110)
Creat: 0.74 mg/dL (ref 0.50–1.03)
Globulin: 3.4 g/dL (calc) (ref 1.9–3.7)
Glucose, Bld: 87 mg/dL (ref 65–99)
Potassium: 4.1 mmol/L (ref 3.5–5.3)
Sodium: 138 mmol/L (ref 135–146)
Total Bilirubin: 0.4 mg/dL (ref 0.2–1.2)
Total Protein: 7.5 g/dL (ref 6.1–8.1)

## 2022-03-14 LAB — TEST AUTHORIZATION: TEST CODE:: 8435

## 2022-03-14 LAB — URINALYSIS, COMPLETE
Bacteria, UA: NONE SEEN /HPF
Bilirubin Urine: NEGATIVE
Glucose, UA: NEGATIVE
Hyaline Cast: NONE SEEN /LPF
Ketones, ur: NEGATIVE
Leukocytes,Ua: NEGATIVE
Nitrite: NEGATIVE
Specific Gravity, Urine: 1.015 (ref 1.001–1.035)
Squamous Epithelial / HPF: NONE SEEN /HPF (ref <=5)
pH: 5.5 (ref 5.0–8.0)

## 2022-03-14 LAB — LIPID PANEL
Cholesterol: 195 mg/dL (ref <200)
HDL: 40 mg/dL — ABNORMAL LOW (ref >=50)
LDL Cholesterol (Calc): 132 mg/dL (calc) — ABNORMAL HIGH
Non-HDL Cholesterol (Calc): 155 mg/dL (calc) — ABNORMAL HIGH (ref <130)
Total CHOL/HDL Ratio: 4.9 (calc) (ref <5.0)
Triglycerides: 115 mg/dL (ref <150)

## 2022-03-14 LAB — BILIRUBIN, DIRECT: Bilirubin, Direct: 0.1 mg/dL (ref 0.0–0.2)

## 2022-03-14 LAB — PHOSPHORUS: Phosphorus: 3.2 mg/dL (ref 2.5–4.5)

## 2022-03-14 LAB — HCG, SERUM, QUALITATIVE: Preg, Serum: NEGATIVE

## 2022-03-28 ENCOUNTER — Ambulatory Visit (HOSPITAL_COMMUNITY)
Admission: EM | Admit: 2022-03-28 | Discharge: 2022-03-28 | Disposition: A | Discharge status: Home / Self Care | Payer: Self-pay | Attending: Emergency Medicine | Admitting: Emergency Medicine

## 2022-03-28 ENCOUNTER — Encounter (HOSPITAL_COMMUNITY): Payer: Self-pay

## 2022-03-28 DIAGNOSIS — R109 Unspecified abdominal pain: Secondary | ICD-10-CM | POA: Insufficient documentation

## 2022-03-28 LAB — COMPREHENSIVE METABOLIC PANEL
ALT: 18 U/L (ref 0–44)
AST: 16 U/L (ref 15–41)
Albumin: 3.4 g/dL — ABNORMAL LOW (ref 3.5–5.0)
Alkaline Phosphatase: 67 U/L (ref 38–126)
Anion gap: 6 (ref 5–15)
BUN: 16 mg/dL (ref 6–20)
CO2: 29 mmol/L (ref 22–32)
Calcium: 9.2 mg/dL (ref 8.9–10.3)
Chloride: 102 mmol/L (ref 98–111)
Creatinine, Ser: 1.01 mg/dL — ABNORMAL HIGH (ref 0.44–1.00)
GFR, Estimated: >60 mL/min (ref >60)
Glucose, Bld: 95 mg/dL (ref 70–99)
Potassium: 4.5 mmol/L (ref 3.5–5.1)
Sodium: 137 mmol/L (ref 135–145)
Total Bilirubin: 0.6 mg/dL (ref 0.3–1.2)
Total Protein: 7.5 g/dL (ref 6.5–8.1)

## 2022-03-28 LAB — POC URINE PREG, ED: Preg Test, Ur: NEGATIVE

## 2022-03-28 LAB — POCT URINALYSIS DIPSTICK, ED / UC
Bilirubin Urine: NEGATIVE
Glucose, UA: NEGATIVE mg/dL
Hgb urine dipstick: NEGATIVE
Ketones, ur: NEGATIVE mg/dL
Leukocytes,Ua: NEGATIVE
Nitrite: NEGATIVE
Protein, ur: NEGATIVE mg/dL
Specific Gravity, Urine: 1.02 (ref 1.005–1.030)
Urobilinogen, UA: 0.2 mg/dL (ref 0.0–1.0)
pH: 5.5 (ref 5.0–8.0)

## 2022-03-28 MED ORDER — BACLOFEN 10 MG PO TABS: 5.0000 mg | ORAL_TABLET | Freq: Three times a day (TID) | ORAL | 0 refills | Status: DC | PRN

## 2022-03-28 NOTE — ED Triage Notes (Addendum)
Pt c/o lt flank pain since Monday. Denies urinary sx's. Denies taking any meds.

## 2022-03-28 NOTE — ED Provider Notes (Signed)
[facility]    CSN: [id number] Arrival date & time: 03/28/22  1517      History   Chief Complaint Chief Complaint  Patient presents with   Flank Pain    HPI Monique [name] is a 51 y.o. female.   Patient presents with concerns of bilateral flank pain. She states it started Monday. She reports pain that starts in her low back and wraps around her sides to the front. She is concerned she may have a UTI or similar. She reports urinary frequency but denies dysuria, hematuria, nausea/vomiting, or fever. She took an aspirin without much improvement. She states it gets worse when she stands. 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   The history is provided by the patient.  Flank Pain Associated symptoms include abdominal pain. Pertinent negatives include no shortness of breath.    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   Lower extremity edema 01/12/2015   Lung abscess (HCC)    Morbid obesity (HCC)    Pneumonia    TB (tuberculosis)    Tonsillitis    Viral URI 09/16/2017   Weight gain 10/27/2018    Patient Active Problem List   Diagnosis Date Noted   ASCUS with positive high risk HPV cervical 07/10/2021   Asthma 07/10/2021   Finger numbness 07/10/2021   Hyperlipidemia 07/06/2021   Screening for cervical cancer 04/24/2020   Encounter for screening mammogram for malignant neoplasm of breast 04/24/2020   Morbid obesity (HCC)    Seasonal allergies 01/26/2019   Healthcare maintenance 05/19/2018   Exertional chest pain 05/13/2017   Obese 08/10/2015   Essential hypertension 01/12/2015   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      Home Medications    Prior to Admission medications   Medication Sig Start Date End Date Taking? Authorizing Provider  baclofen (LIORESAL) 10 MG tablet Take 0.5-1 tablets (5-10 mg total) by mouth 3 (three) times daily as needed for muscle spasms. 03/28/22  Yes Chloee Tena L, PA  albuterol (VENTOLIN HFA) 108 (90 Base) MCG/ACT inhaler Inhale 1-2 puffs into the lungs every 6 (six) hours as needed for wheezing or shortness of breath. 01/23/22   Valinda Hoar, NP  amLODipine (NORVASC) 10 MG tablet TAKE 1 TABLET(10 MG) BY MOUTH DAILY 12/26/21   Daiva Eves, Lisette Grinder, MD  aspirin EC 81 MG tablet Take 1 tablet (81 mg total) by mouth daily. Swallow whole. 07/06/21   Verdene Lennert, MD  atorvastatin (LIPITOR) 20 MG tablet Take 1 tablet (20 mg total) by mouth daily. 07/06/21 07/06/22  Verdene Lennert, MD  benzonatate (TESSALON) 100 MG capsule Take 1 capsule (100 mg total) by mouth every 8 (eight) hours. 01/23/22   White, Elita Boone, NP  bictegravir-emtricitabine-tenofovir AF (BIKTARVY) 50-200-25 MG TABS tablet  Take 1 tablet by mouth daily. 02/21/22   Randall Hiss, MD  cetirizine (ZYRTEC ALLERGY) 10 MG tablet Take 1 tablet (10 mg total) by mouth daily. 12/24/19 12/23/20  Bloomfield, Carley D, DO  fluticasone (FLONASE) 50 MCG/ACT nasal spray Place 2 sprays into both nostrils daily. 11/23/19 11/22/20  Randall Hiss, MD  hydrochlorothiazide (HYDRODIURIL) 25 MG tablet TAKE 1 TABLET(25 MG) BY MOUTH DAILY 10/28/20   Daiva Eves, Lisette Grinder, MD    Family History Family History  Problem Relation Age of Onset   Diabetes  Sister    Hypertension Sister    Hypertension Mother    Hypertension Father     Social History Social History   Tobacco Use   Smoking status: Former    Types: Cigarettes    Quit date: 12/22/2013    Years since quitting: 8.2   Smokeless tobacco: Never  Vaping Use   Vaping Use: Never used  Substance Use Topics   Alcohol use: No    Alcohol/week: 0.0 standard drinks of alcohol   Drug use: No     Allergies   Patient has no known allergies.   Review of Systems Review of Systems  Constitutional:  Negative for fatigue and fever.  Respiratory:  Negative for shortness of breath.   Gastrointestinal:  Positive for abdominal pain. Negative for diarrhea, nausea and vomiting.  Genitourinary:  Positive for flank pain and frequency. Negative for dysuria, hematuria and vaginal discharge.  Musculoskeletal:  Positive for back pain.  Skin:  Negative for rash.  Neurological:  Negative for weakness and numbness.     Physical Exam Triage Vital Signs ED Triage Vitals  Enc Vitals Group     BP 03/28/22 1610 (!) 188/111     Pulse Rate 03/28/22 1610 89     Resp 03/28/22 1610 18     Temp 03/28/22 1610 99 F (37.2 C)     Temp Source 03/28/22 1610 Oral     SpO2 03/28/22 1610 97 %     Weight --      Height --      Head Circumference --      Peak Flow --      Pain Score 03/28/22 1611 6     Pain Loc --      Pain Edu? --      Excl. in GC? --    No data found.  Updated Vital Signs BP (!) 166/112 (BP Location: Right Arm)   Pulse 92   Temp 98.8 F (37.1 C) (Oral)   Resp 18   LMP 03/25/2022   SpO2 99%   Visual Acuity Right Eye Distance:   Left Eye Distance:   Bilateral Distance:    Right Eye Near:   Left Eye Near:    Bilateral Near:     Physical Exam Vitals and nursing note reviewed.  Constitutional:      General: She is not in acute distress. Eyes:     Pupils: Pupils are equal, round, and reactive to light.  Cardiovascular:     Rate and Rhythm: Normal rate and regular  rhythm.     Heart sounds: Normal heart sounds.  Pulmonary:     Effort: Pulmonary effort is normal.     Breath sounds: Normal breath sounds.  Abdominal:     Tenderness: There is abdominal tenderness. There is no right CVA tenderness, left CVA tenderness, guarding or rebound.     Comments: Diffuse tenderness across lower abdomen.  Musculoskeletal:  Lumbar back: No tenderness. Normal range of motion. Negative right straight leg raise test and negative left straight leg raise test.     Comments: No lumbar tenderness but some spasm noted  Skin:    Findings: No rash.  Neurological:     Mental Status: She is alert.  Psychiatric:        Mood and Affect: Mood normal.      UC Treatments / Results  Labs (all labs ordered are listed, but only abnormal results are displayed) Labs Reviewed  URINE CULTURE  COMPREHENSIVE METABOLIC PANEL  POCT URINALYSIS DIPSTICK, ED / UC  POC URINE PREG, ED    EKG   Radiology No results found.  Procedures Procedures (including critical care time)  Medications Ordered in UC Medications - No data to display  Initial Impression / Assessment and Plan / UC Course  I have reviewed the triage vital signs and the nursing notes.  Pertinent labs & imaging results that were available during my care of the patient were reviewed by me and considered in my medical decision making (see chart for details).     Bilateral flank pain radiating to abdomen. Ddx includes pyelo, nephrolithiasis, UTI, muscular. Neg UA in clinic and no dysuria - will send out Ucx to confirm. Suspect most likely muscular but given pain, elevated BP, and medical hx will send out CMP to verify no change in kidney function concerning for renal cause of pain. Follow-up and ER precautions discussed w patient.   E/M: 1 acute complicated illness, 4 data (UA, preg, Ucx, CMP), moderate risk due to prescription management  Final Clinical Impressions(s) / UC Diagnoses   Final diagnoses:   Flank pain     Discharge Instructions      No sign of urine infection in clinic but we are sending out for culture to be sure. Given the location of your flank pain we are sending out blood work to check your kidneys as well. Take medication as prescribed as needed to help with muscle tightness. Follow-up with PCP if minimal improvement. Go to the ER if severe pain or new concerning symptoms such as vomiting or inability to urinate.      ED Prescriptions     Medication Sig Dispense Auth. Provider   baclofen (LIORESAL) 10 MG tablet Take 0.5-1 tablets (5-10 mg total) by mouth 3 (three) times daily as needed for muscle spasms. 30 each Vallery Sa, Shemika Robbs L, PA      PDMP not reviewed this encounter.   Estanislado Pandy, [place] 03/28/22 1644

## 2022-03-28 NOTE — Discharge Instructions (Addendum)
No sign of urine infection in clinic but we are sending out for culture to be sure. Given the location of your flank pain we are sending out blood work to check your kidneys as well. Take medication as prescribed as needed to help with muscle tightness. Follow-up with PCP if minimal improvement. Go to the ER if severe pain or new concerning symptoms such as vomiting or inability to urinate.

## 2022-03-29 ENCOUNTER — Encounter: Payer: Self-pay | Admitting: Family Medicine

## 2022-03-29 ENCOUNTER — Telehealth: Payer: Self-pay

## 2022-03-29 ENCOUNTER — Ambulatory Visit: Payer: 59 | Admitting: Family Medicine

## 2022-03-29 LAB — URINE CULTURE

## 2022-03-29 NOTE — Progress Notes (Signed)
Patient did not keep appointment today. She will be called to reschedule.  

## 2022-03-30 NOTE — Telephone Encounter (Signed)
Called pt to follow up on missed appointment yesterday. Mailbox is full, unable to leave VM. Patient has not been active on MyChart since March 2022. Letter mailed.

## 2022-04-03 ENCOUNTER — Ambulatory Visit: Payer: 59 | Admitting: Infectious Disease

## 2022-04-04 ENCOUNTER — Telehealth: Payer: Self-pay

## 2022-04-04 ENCOUNTER — Ambulatory Visit: Payer: Self-pay | Admitting: Infectious Disease

## 2022-04-04 NOTE — Telephone Encounter (Signed)
Called patient to reschedule missed appointment. No answer. Left HIPAA-compliant voicemail requesting call back. Will also send MyChart message.  Charline Hoskinson E Merdis Snodgrass, RN  

## 2022-05-01 ENCOUNTER — Other Ambulatory Visit: Payer: Self-pay

## 2022-05-01 ENCOUNTER — Ambulatory Visit (HOSPITAL_COMMUNITY)
Admission: EM | Admit: 2022-05-01 | Discharge: 2022-05-01 | Disposition: A | Discharge status: Home / Self Care | Payer: Self-pay | Attending: Family Medicine | Admitting: Family Medicine

## 2022-05-01 ENCOUNTER — Encounter (HOSPITAL_COMMUNITY): Payer: Self-pay | Admitting: *Deleted

## 2022-05-01 ENCOUNTER — Ambulatory Visit (INDEPENDENT_AMBULATORY_CARE_PROVIDER_SITE_OTHER): Payer: Self-pay

## 2022-05-01 DIAGNOSIS — Z87891 Personal history of nicotine dependence: Secondary | ICD-10-CM | POA: Insufficient documentation

## 2022-05-01 DIAGNOSIS — R509 Fever, unspecified: Secondary | ICD-10-CM

## 2022-05-01 DIAGNOSIS — R0602 Shortness of breath: Secondary | ICD-10-CM

## 2022-05-01 DIAGNOSIS — Z20822 Contact with and (suspected) exposure to covid-19: Secondary | ICD-10-CM | POA: Insufficient documentation

## 2022-05-01 DIAGNOSIS — J069 Acute upper respiratory infection, unspecified: Secondary | ICD-10-CM

## 2022-05-01 DIAGNOSIS — R062 Wheezing: Secondary | ICD-10-CM

## 2022-05-01 DIAGNOSIS — R6883 Chills (without fever): Secondary | ICD-10-CM

## 2022-05-01 DIAGNOSIS — Z8616 Personal history of COVID-19: Secondary | ICD-10-CM | POA: Insufficient documentation

## 2022-05-01 LAB — RESP PANEL BY RT-PCR (RSV, FLU A&B, COVID)  RVPGX2
Influenza A by PCR: NEGATIVE
Influenza B by PCR: NEGATIVE
Resp Syncytial Virus by PCR: NEGATIVE
SARS Coronavirus 2 by RT PCR: NEGATIVE

## 2022-05-01 MED ORDER — PROMETHAZINE-DM 6.25-15 MG/5ML PO SYRP: 5.0000 mL | ORAL_SOLUTION | Freq: Four times a day (QID) | ORAL | 0 refills | Status: DC | PRN

## 2022-05-01 MED ORDER — PREDNISONE 20 MG PO TABS: 40.0000 mg | ORAL_TABLET | Freq: Every day | ORAL | 0 refills | Status: DC

## 2022-05-01 NOTE — ED Provider Notes (Signed)
[facility]   [id number] 05/01/22 Arrival Time: 1103  ASSESSMENT & PLAN:  1. Viral URI with cough   2. Wheezing   3. Subjective fever    I have personally viewed the imaging studies ordered this visit. No acute changes. No PNA.  Discussed typical duration of viral illnesses. Viral testing pending. OTC symptom care as needed.  Discharge Medication List as of 05/01/2022  1:00 PM     START taking these medications   Details  predniSONE (DELTASONE) 20 MG tablet Take 2 tablets (40 mg total) by mouth daily., Starting Tue 05/01/2022, Normal    promethazine-dextromethorphan (PROMETHAZINE-DM) 6.25-15 MG/5ML syrup Take 5 mLs by mouth 4 (four) times daily as needed for cough., Starting Tue 05/01/2022, Normal          Reviewed expectations re: course of current medical issues. Questions answered. Outlined signs and symptoms indicating need for more acute intervention. Understanding verbalized. After Visit Summary given.   SUBJECTIVE: History from: Patient. Monique [name] is a 51 y.o. female. Reports: cough, chills, HA, fatigue, body aches; abrupt onset; x 3-4 days; feeling SOB today, off/on. No assoc CP/n/v/diaphoresis.  Normal PO intake without n/v/d.  OBJECTIVE:  Vitals:   05/01/22 1159  BP: (!) 158/102  Pulse: 89  Resp: 20  Temp: 98.5 F (36.9 C)  SpO2: 99%    General appearance: alert; no distress Eyes: PERRLA; EOMI; conjunctiva normal HENT: [place]; AT; with nasal congestion Neck: supple  Lungs: speaks full sentences without difficulty; unlabored; mod bilat wheezing Extremities: no edema Skin: warm and dry Neurologic: normal gait Psychological: alert and cooperative; normal mood and affect  Labs:  Labs Reviewed  RESP PANEL BY RT-PCR (RSV, FLU A&B, COVID)  [id number]    Imaging: DG Chest 2 View  Result Date: 05/01/2022 CLINICAL DATA:  Shortness of breath chills and headache since Friday. EXAM: CHEST - 2 VIEW COMPARISON:  Chest x-ray 11/11/2021 and chest CT  09/21/2014 FINDINGS: The cardiac silhouette, mediastinal and hilar contours are upper limits of normal in stable. Chronic left upper lobe scarring changes and chronic interstitial prominence. No infiltrates, edema or effusions. The bony thorax is intact. IMPRESSION: Chronic left upper lobe scarring changes and chronic interstitial prominence. No acute cardiopulmonary findings. Electronically Signed   By: Rudie Meyer M.D.   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   Lower extremity edema 01/12/2015   Lung abscess (HCC)    Morbid obesity (HCC)    Pneumonia    TB (tuberculosis)    Tonsillitis    Viral URI 09/16/2017   Weight gain 10/27/2018   Social History   Socioeconomic History   Marital status: Single    Spouse name: Not on file   Number of children: Not on file   Years of education: Not on file   Highest education level: Not on file  Occupational History   Not on file  Tobacco Use   Smoking status: Former    Types: Cigarettes    Quit date: 12/22/2013    Years since quitting: 8.3   Smokeless tobacco: Never  Vaping Use   Vaping Use: Never used  Substance and Sexual Activity   Alcohol use: No  Alcohol/week: 0.0 standard drinks of alcohol   Drug use: No   Sexual activity: Not Currently    Partners: Male    Birth control/protection: None  Other Topics Concern   Not on file  Social History Narrative   Not on file   Social Determinants of Health   Financial Resource Strain: Not on file  Food Insecurity: Not on file  Transportation Needs: Not on file  Physical Activity: Not on file  Stress: Not on file  Social Connections: Not on file  Intimate Partner Violence:  Not on file   Family History  Problem Relation Age of Onset   Diabetes Sister    Hypertension Sister    Hypertension Mother    Hypertension Father    Past Surgical History:  Procedure Laterality Date   CESAREAN SECTION     VIDEO BRONCHOSCOPY Bilateral 04/13/2013   Procedure: VIDEO BRONCHOSCOPY WITHOUT FLUORO;  Surgeon: Kalman Shan, MD;  Location: [facility] ENDOSCOPY;  Service: Cardiopulmonary;  Laterality: Bilateral;     Mardella Layman, MD 05/01/22 [phone]

## 2022-05-01 NOTE — ED Triage Notes (Signed)
PT reports SHOB ,chills and HA since Friday. Pt works in a [employer].

## 2022-05-16 ENCOUNTER — Ambulatory Visit (HOSPITAL_COMMUNITY)
Admission: EM | Admit: 2022-05-16 | Discharge: 2022-05-16 | Disposition: A | Discharge status: Home / Self Care | Payer: Commercial Managed Care - HMO | Attending: Family Medicine | Admitting: Family Medicine

## 2022-05-16 ENCOUNTER — Encounter (HOSPITAL_COMMUNITY): Payer: Self-pay

## 2022-05-16 DIAGNOSIS — R109 Unspecified abdominal pain: Secondary | ICD-10-CM | POA: Diagnosis present

## 2022-05-16 DIAGNOSIS — R3129 Other microscopic hematuria: Secondary | ICD-10-CM | POA: Diagnosis not present

## 2022-05-16 LAB — CBC WITH DIFFERENTIAL/PLATELET
Abs Immature Granulocytes: 0.02 10*3/uL (ref 0.00–0.07)
Basophils Absolute: 0.1 10*3/uL (ref 0.0–0.1)
Basophils Relative: 1 %
Eosinophils Absolute: 0.2 10*3/uL (ref 0.0–0.5)
Eosinophils Relative: 1 %
HCT: 40.1 % (ref 36.0–46.0)
Hemoglobin: 12.8 g/dL (ref 12.0–15.0)
Immature Granulocytes: 0 %
Lymphocytes Relative: 20 %
Lymphs Abs: 2.4 10*3/uL (ref 0.7–4.0)
MCH: 25.7 pg — ABNORMAL LOW (ref 26.0–34.0)
MCHC: 31.9 g/dL (ref 30.0–36.0)
MCV: 80.4 fL (ref 80.0–100.0)
Monocytes Absolute: 1.2 10*3/uL — ABNORMAL HIGH (ref 0.1–1.0)
Monocytes Relative: 10 %
Neutro Abs: 8.2 10*3/uL — ABNORMAL HIGH (ref 1.7–7.7)
Neutrophils Relative %: 68 %
Platelets: 317 10*3/uL (ref 150–400)
RBC: 4.99 MIL/uL (ref 3.87–5.11)
RDW: 15.7 % — ABNORMAL HIGH (ref 11.5–15.5)
WBC: 12 10*3/uL — ABNORMAL HIGH (ref 4.0–10.5)
nRBC: 0 % (ref 0.0–0.2)

## 2022-05-16 LAB — COMPREHENSIVE METABOLIC PANEL
ALT: 26 U/L (ref 0–44)
AST: 27 U/L (ref 15–41)
Albumin: 3.4 g/dL — ABNORMAL LOW (ref 3.5–5.0)
Alkaline Phosphatase: 72 U/L (ref 38–126)
Anion gap: 6 (ref 5–15)
BUN: 17 mg/dL (ref 6–20)
CO2: 24 mmol/L (ref 22–32)
Calcium: 8.6 mg/dL — ABNORMAL LOW (ref 8.9–10.3)
Chloride: 107 mmol/L (ref 98–111)
Creatinine, Ser: 0.84 mg/dL (ref 0.44–1.00)
GFR, Estimated: >60 mL/min (ref >60)
Glucose, Bld: 89 mg/dL (ref 70–99)
Potassium: 4.6 mmol/L (ref 3.5–5.1)
Sodium: 137 mmol/L (ref 135–145)
Total Bilirubin: 0.7 mg/dL (ref 0.3–1.2)
Total Protein: 7.1 g/dL (ref 6.5–8.1)

## 2022-05-16 LAB — POCT URINALYSIS DIPSTICK, ED / UC
Bilirubin Urine: NEGATIVE
Glucose, UA: NEGATIVE mg/dL
Hgb urine dipstick: NEGATIVE
Nitrite: NEGATIVE
Protein, ur: 30 mg/dL — AB
Specific Gravity, Urine: 1.02 (ref 1.005–1.030)
Urobilinogen, UA: 0.2 mg/dL (ref 0.0–1.0)
pH: 5.5 (ref 5.0–8.0)

## 2022-05-16 LAB — LIPASE, BLOOD: Lipase: 30 U/L (ref 11–51)

## 2022-05-16 MED ORDER — KETOROLAC TROMETHAMINE 60 MG/2ML IM SOLN
INTRAMUSCULAR | Status: AC
  Filled 2022-05-16: qty 2

## 2022-05-16 MED ORDER — KETOROLAC TROMETHAMINE 60 MG/2ML IM SOLN
60.0000 mg | Freq: Once | INTRAMUSCULAR | Status: AC
  Administered 2022-05-16: 60 mg via INTRAMUSCULAR

## 2022-05-16 NOTE — ED Provider Notes (Signed)
[facility]   [id number] 05/16/22 Arrival Time: 1540  ASSESSMENT & PLAN:  1. Acute left flank pain    Benign abdominal exam. No indications for urgent abdominal/pelvic imaging at this time. Discussed possibility of kidney stone. Labs pending. She is comfortable with home observation.  Pending:  URINE CULTURE  CBC WITH DIFFERENTIAL/PLATELET  COMPREHENSIVE METABOLIC PANEL  LIPASE, BLOOD   Meds ordered this encounter  Medications   ketorolac (TORADOL) injection 60 mg     Discharge Instructions       Meds ordered this encounter  Medications   ketorolac (TORADOL) injection 60 mg   You have been seen today for abdominal pain. Your evaluation was not suggestive of any emergent condition requiring medical intervention at this time. However, some abdominal problems make take more time to appear. Therefore, it is very important for you to pay attention to any new symptoms or worsening of your current condition.  Please return here or to the Emergency Department immediately should you begin to feel worse in any way or have any of the following symptoms: increasing or different abdominal pain, persistent vomiting, inability to drink fluids, fevers, or shaking chills.   You have had labs (blood work) drawn today. We will call you with any significant abnormalities or if there is need to begin or change treatment or pursue further follow up.  You may also review your test results online through [facility]. If you do not have a MyChart account, instructions to sign up should be on your discharge paperwork.     Follow-up Information     [facility].   Specialty: Emergency Medicine Why: If symptoms worsen in any way. Contact information: [address] [id number] [place] [phone]               Reviewed expectations re: course of current medical issues. Questions answered. Outlined signs  and symptoms indicating need for more acute intervention. Patient verbalized understanding. After Visit Summary given.   SUBJECTIVE: History from: patient. Monique [name] is a 51 y.o. female who presents with complaint of intermittent left flank discomfort that radiates to lower abdomen; gradual onset; first noted approx 3 d ago; stable since. No urinary freq/dysuria/urgency. No hematuria. Hard for her to describe pain. Afebrile. Without n/v/d. Normal PO intake. Normal bowel movements. No tx PTA.  Patient's last menstrual period was 04/26/2022.  Past Surgical History:  Procedure Laterality Date   CESAREAN SECTION     VIDEO BRONCHOSCOPY Bilateral 04/13/2013   Procedure: VIDEO BRONCHOSCOPY WITHOUT FLUORO;  Surgeon: Brand Males, MD;  Location: [facility];  Service: Cardiopulmonary;  Laterality: Bilateral;     OBJECTIVE:  Vitals:   05/16/22 1306  BP: (!) 152/88  Pulse: 96  Resp: 20  Temp: 98.7 F (37.1 C)  TempSrc: Oral  SpO2: 97%    General appearance: alert, oriented, no acute distress HEENT: [place]; AT; oropharynx moist Lungs: unlabored respirations Abdomen: soft; without distention; no specific tenderness to palpation; normal bowel sounds; without masses or organomegaly; without guarding or rebound tenderness Back: with mild left CVA tenderness; FROM at waist Extremities: without LE edema; symmetrical; without gross deformities Skin: warm and dry Neurologic: normal gait Psychological: alert and cooperative; normal mood and affect  Labs: Results for orders placed or performed during the hospital encounter of 05/16/22  POC Urinalysis dipstick  Result Value Ref Range   Glucose, UA NEGATIVE NEGATIVE mg/dL   Bilirubin Urine NEGATIVE NEGATIVE   Ketones, ur TRACE (A)  NEGATIVE mg/dL   Specific Gravity, Urine 1.020 1.005 - 1.030   Hgb urine dipstick NEGATIVE NEGATIVE   pH 5.5 5.0 - 8.0   Protein, ur 30 (A) NEGATIVE mg/dL   Urobilinogen, UA 0.2 0.0 - 1.0 mg/dL    Nitrite NEGATIVE NEGATIVE   Leukocytes,Ua SMALL (A) NEGATIVE   Labs Reviewed  POCT URINALYSIS DIPSTICK, ED / UC - Abnormal; Notable for the following components:      Result Value   Ketones, ur TRACE (*)    Protein, ur 30 (*)    Leukocytes,Ua SMALL (*)    All other components within normal limits  URINE CULTURE  CBC WITH DIFFERENTIAL/PLATELET  COMPREHENSIVE METABOLIC PANEL  LIPASE, BLOOD    Imaging: No results found.   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   Lower extremity edema 01/12/2015   Lung abscess (HCC)    Morbid obesity (HCC)    Pneumonia    TB (tuberculosis)    Tonsillitis    Viral URI 09/16/2017   Weight gain 10/27/2018    Social History   Socioeconomic History   Marital status: Single    Spouse name: Not on file   Number of children: Not on file   Years of education: Not on file   Highest education level: Not on file  Occupational History   Not on file  Tobacco Use   Smoking status: Former    Types: Cigarettes    Quit date: 12/22/2013    Years since quitting: 8.4   Smokeless tobacco: Never  Vaping Use   Vaping Use: Never used  Substance and Sexual Activity   Alcohol use: No    Alcohol/week: 0.0 standard drinks of alcohol   Drug use: No   Sexual activity: Not Currently    Partners: Male    Birth control/protection: None  Other Topics Concern   Not on file  Social History Narrative   Not on file   Social Determinants of Health   Financial Resource Strain: Not on file  Food Insecurity: Not on file  Transportation Needs: Not on file  Physical Activity: Not on file  Stress: Not on file  Social Connections: Not on file   Intimate Partner Violence: Not on file    Family History  Problem Relation Age of Onset   Diabetes Sister    Hypertension Sister    Hypertension Mother    Hypertension Father      Mardella Layman, MD 05/19/22 [phone]

## 2022-05-16 NOTE — Discharge Instructions (Signed)
Meds ordered this encounter  Medications   ketorolac (TORADOL) injection 60 mg   You have been seen today for abdominal pain. Your evaluation was not suggestive of any emergent condition requiring medical intervention at this time. However, some abdominal problems make take more time to appear. Therefore, it is very important for you to pay attention to any new symptoms or worsening of your current condition.  Please return here or to the Emergency Department immediately should you begin to feel worse in any way or have any of the following symptoms: increasing or different abdominal pain, persistent vomiting, inability to drink fluids, fevers, or shaking chills.   You have had labs (blood work) drawn today. We will call you with any significant abnormalities or if there is need to begin or change treatment or pursue further follow up.  You may also review your test results online through [facility]. If you do not have a MyChart account, instructions to sign up should be on your discharge paperwork.

## 2022-05-16 NOTE — ED Triage Notes (Signed)
Patient states she has had lower abdominal pain x3 days. States it is now radiating into her back. Denies urinary frequency and pain with urination.

## 2022-05-17 ENCOUNTER — Ambulatory Visit: Payer: Self-pay | Admitting: Infectious Disease

## 2022-05-17 LAB — URINE CULTURE: Culture: >=100000 — AB

## 2022-05-25 ENCOUNTER — Other Ambulatory Visit: Payer: Self-pay

## 2022-05-25 ENCOUNTER — Encounter (INDEPENDENT_AMBULATORY_CARE_PROVIDER_SITE_OTHER): Payer: Commercial Managed Care - HMO | Admitting: Infectious Disease

## 2022-05-25 VITALS — BP 162/92 | HR 72 | Temp 98.2°F | Resp 16 | Wt 246.7 lb

## 2022-05-25 DIAGNOSIS — E7849 Other hyperlipidemia: Secondary | ICD-10-CM

## 2022-05-25 DIAGNOSIS — Z006 Encounter for examination for normal comparison and control in clinical research program: Secondary | ICD-10-CM

## 2022-05-25 DIAGNOSIS — B20 Human immunodeficiency virus [HIV] disease: Secondary | ICD-10-CM

## 2022-05-25 DIAGNOSIS — I10 Essential (primary) hypertension: Secondary | ICD-10-CM

## 2022-05-25 MED ORDER — AMLODIPINE BESYLATE 10 MG PO TABS
ORAL_TABLET | ORAL | 11 refills | Status: DC
Start: 2022-05-25 — End: 2023-06-03

## 2022-05-25 NOTE — Research (Signed)
Individual seen by Research for Screening visit for [id number]. A Phase 3, Randomized, Active-Controlled, Double-Blind Clinical Study to Evaluate a Switch to Doravirine/Islatravir (DOR/ISL 100 mg/0.25 mg) Once-Daily in Participants With HIV-1 Who Are Virologically Suppressed on Bictegravir/Emtricitabine/Tenofovir Alafenamide (BIC/FTC/TAF). Inform Consent explained/reviewed, risk, benefits, responsibilities and other options were reviewed. Questions answered, comprehension of the study was assessed and adequate time to consider options was provided. Individual verbalized understanding and signed the informed consent witnessed by study coordinator. Signed consent was obtained prior to any procedures being done.Plan to see individual again on Oct 12 for entry visit, pending eligibility.

## 2022-05-25 NOTE — Progress Notes (Signed)
Chief complaint: here for study visit and Physical Exam  Subjective:    Patient ID: Monique [name], female    DOB: 01/12/1971, 51 y.o.   MRN: [id number]  HPI  Monique [name] is a 51 year old Black woman with HIV who has been perfectly controlled on ARV, most recently on Biktarvy. She is here for 052 Screening Visit.  She did not take any of her medications today yet including her anti-hypertensives due to her rushing to get to [facility] for the visit.  She is very interested in the study and appreciates the opportunity.  We also did discuss the REPRIEVE study though I would like to hold off on starting statin for her or new drug until she has started in this study.     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 ([facility]) 01/12/2015   Lower extremity edema 01/12/2015   Lung abscess ([facility])    Morbid obesity ([facility])    Pneumonia    TB (tuberculosis)    Tonsillitis    Viral URI 09/16/2017   Weight gain 10/27/2018    Past Surgical History:  Procedure Laterality Date   CESAREAN SECTION     VIDEO BRONCHOSCOPY Bilateral 04/13/2013   Procedure: VIDEO BRONCHOSCOPY WITHOUT FLUORO;  Surgeon: Brand Males, MD;  Location: [facility];  Service: Cardiopulmonary;  Laterality: Bilateral;    Family History  Problem Relation Age of Onset   Diabetes Sister    Hypertension Sister    Hypertension Mother    Hypertension Father       Social History   Socioeconomic History   Marital status: Single    Spouse name: Not on file   Number of children: Not on file   Years of education: Not on file   Highest education level: Not on file  Occupational History   Not on file  Tobacco Use   Smoking status: Former    Types: Cigarettes    Quit date:  12/22/2013    Years since quitting: 8.4   Smokeless tobacco: Never  Vaping Use   Vaping Use: Never used  Substance and Sexual Activity   Alcohol use: No    Alcohol/week: 0.0 standard drinks of alcohol   Drug use: No   Sexual activity: Not Currently    Partners: Male    Birth control/protection: None  Other Topics Concern   Not on file  Social History Narrative   Not on file   Social Determinants of Health   Financial Resource Strain: Not on file  Food Insecurity: Not on file  Transportation Needs: Not on file  Physical Activity: Not on file  Stress: Not on file  Social Connections: Not on file    No Known Allergies   Current Outpatient Medications:    albuterol (VENTOLIN HFA) 108 (90 Base) MCG/ACT inhaler, Inhale 1-2 puffs into the lungs every 6 (six) hours as needed for wheezing or shortness of breath., Disp: 18 g, Rfl: 1   amLODipine (NORVASC) 10 MG tablet, TAKE 1 TABLET(10 MG) BY MOUTH DAILY, Disp: 30 tablet, Rfl: 0   aspirin EC 81 MG tablet, Take 1 tablet (81 mg total) by mouth daily. Swallow whole., Disp: 30 tablet, Rfl: 2   atorvastatin (LIPITOR) 20 MG tablet, Take 1 tablet (  20 mg total) by mouth daily., Disp: 30 tablet, Rfl: 2   baclofen (LIORESAL) 10 MG tablet, Take 0.5-1 tablets (5-10 mg total) by mouth 3 (three) times daily as needed for muscle spasms., Disp: 30 each, Rfl: 0   benzonatate (TESSALON) 100 MG capsule, Take 1 capsule (100 mg total) by mouth every 8 (eight) hours., Disp: 21 capsule, Rfl: 0   bictegravir-emtricitabine-tenofovir AF (BIKTARVY) 50-200-25 MG TABS tablet, Take 1 tablet by mouth daily., Disp: 30 tablet, Rfl: 0   cetirizine (ZYRTEC ALLERGY) 10 MG tablet, Take 1 tablet (10 mg total) by mouth daily., Disp: 30 tablet, Rfl: 2   fluticasone (FLONASE) 50 MCG/ACT nasal spray, Place 2 sprays into both nostrils daily., Disp: 1 g, Rfl: 5   hydrochlorothiazide (HYDRODIURIL) 25 MG tablet, TAKE 1 TABLET(25 MG) BY MOUTH DAILY, Disp: 30 tablet, Rfl: 11    predniSONE (DELTASONE) 20 MG tablet, Take 2 tablets (40 mg total) by mouth daily., Disp: 10 tablet, Rfl: 0   promethazine-dextromethorphan (PROMETHAZINE-DM) 6.25-15 MG/5ML syrup, Take 5 mLs by mouth 4 (four) times daily as needed for cough., Disp: 118 mL, Rfl: 0   Review of Systems  Constitutional:  Negative for activity change, appetite change, chills, diaphoresis, fatigue, fever and unexpected weight change.  HENT:  Negative for congestion, rhinorrhea, sinus pressure, sneezing, sore throat and trouble swallowing.   Eyes:  Negative for photophobia and visual disturbance.  Respiratory:  Negative for cough, chest tightness, shortness of breath, wheezing and stridor.   Cardiovascular:  Negative for chest pain, palpitations and leg swelling.  Gastrointestinal:  Negative for abdominal distention, abdominal pain, anal bleeding, blood in stool, constipation, diarrhea, nausea and vomiting.  Genitourinary:  Negative for difficulty urinating, dysuria, flank pain and hematuria.  Musculoskeletal:  Negative for arthralgias, back pain, gait problem, joint swelling and myalgias.  Skin:  Negative for color change, pallor, rash and wound.  Neurological:  Negative for dizziness, tremors, weakness and light-headedness.  Hematological:  Negative for adenopathy. Does not bruise/bleed easily.  Psychiatric/Behavioral:  Negative for agitation, behavioral problems, confusion, decreased concentration, dysphoric mood and sleep disturbance.        Objective:   Physical Exam Constitutional:      General: She is not in acute distress.    Appearance: Normal appearance. She is well-developed. She is obese. She is not ill-appearing or diaphoretic.  HENT:     Head: Normocephalic and atraumatic.     Right Ear: Hearing and external ear normal.     Left Ear: Hearing and external ear normal.     Nose: No nasal deformity or rhinorrhea.  Eyes:     General: No scleral icterus.    Conjunctiva/sclera: Conjunctivae normal.      Right eye: Right conjunctiva is not injected.     Left eye: Left conjunctiva is not injected.     Pupils: Pupils are equal, round, and reactive to light.  Neck:     Vascular: No JVD.  Cardiovascular:     Rate and Rhythm: Normal rate and regular rhythm.     Heart sounds: Normal heart sounds, S1 normal and S2 normal. No murmur heard.    No friction rub. No gallop.  Pulmonary:     Effort: Pulmonary effort is normal. No respiratory distress.     Breath sounds: Normal breath sounds. No stridor. No wheezing, rhonchi or rales.  Abdominal:     General: Bowel sounds are normal. There is no distension.     Palpations: Abdomen is soft. There is no mass.  Tenderness: There is no abdominal tenderness. There is no rebound.  Musculoskeletal:        General: Normal range of motion.     Right shoulder: Normal.     Left shoulder: Normal.     Cervical back: Normal range of motion and neck supple.     Right hip: Normal.     Left hip: Normal.     Right knee: Normal.     Left knee: Normal.  Lymphadenopathy:     Head:     Right side of head: No submental, submandibular, preauricular or posterior auricular adenopathy.     Left side of head: No submental, submandibular, preauricular or posterior auricular adenopathy.     Cervical: No cervical adenopathy.     Right cervical: No superficial or deep cervical adenopathy.    Left cervical: No superficial or deep cervical adenopathy.     Upper Body:     Right upper body: No supraclavicular adenopathy.     Left upper body: No supraclavicular adenopathy.  Skin:    General: Skin is warm and dry.     Coloration: Skin is not pale.     Findings: No abrasion, bruising, ecchymosis, erythema, lesion or rash. Rash is not crusting or macular.     Nails: There is no clubbing.     Comments: Tattoo on left arm  Neurological:     General: No focal deficit present.     Mental Status: She is alert and oriented to person, place, and time.     Cranial Nerves: No  cranial nerve deficit.     Sensory: No sensory deficit.     Motor: No weakness.     Coordination: Coordination normal.     Gait: Gait normal.     Deep Tendon Reflexes: Reflexes normal.  Psychiatric:        Attention and Perception: She is attentive.        Mood and Affect: Mood normal.        Speech: Speech normal.        Behavior: Behavior normal. Behavior is cooperative.        Thought Content: Thought content normal.        Judgment: Judgment normal.           Assessment & Plan:   HIV: she had normal Complete Physical exam  Patient has had labs drawn, signed informed consent and will come back for entry visit pending eligibility.  Hypertension: I will send in rx for amlodipine   CV prevention: will send in stating but when she comes to see me in clinic not prior to starting in study

## 2022-05-29 ENCOUNTER — Telehealth: Payer: Self-pay

## 2022-05-29 ENCOUNTER — Other Ambulatory Visit: Payer: Self-pay | Admitting: Infectious Disease

## 2022-05-29 DIAGNOSIS — B20 Human immunodeficiency virus [HIV] disease: Secondary | ICD-10-CM

## 2022-05-29 MED ORDER — BIKTARVY 50-200-25 MG PO TABS: 1.0000 | ORAL_TABLET | Freq: Every day | ORAL | 0 refills | Status: DC

## 2022-05-29 NOTE — Telephone Encounter (Signed)
Patient called requesting refill of Biktarvy and HCTZ. Will route to provider.   Beryle Flock, RN

## 2022-05-29 NOTE — Addendum Note (Signed)
Addended by: Lucie Leather D on: 05/29/2022 12:28 PM   Modules accepted: Orders

## 2022-06-04 ENCOUNTER — Encounter: Payer: Commercial Managed Care - HMO | Admitting: Internal Medicine

## 2022-06-04 ENCOUNTER — Encounter: Payer: Self-pay | Admitting: Infectious Disease

## 2022-06-04 ENCOUNTER — Encounter: Payer: Self-pay | Admitting: Student

## 2022-06-04 NOTE — Progress Notes (Deleted)
HTN: Current regimen is amlodipine 10 mg daily, HCTZ 25 mg daily.  Asthma: albuterol  HIV  HLD: Current regimen is atoravastatin 20 mg daily.   Obese  ASCUS: has missed appointments with OB. Will ask.  Asa 81 Baclofen 5-10 TID PRN Tessalon perles Biktarvy Prednisone 40 daily x5d should be done Promethazine  Pap? Colon Flu  09/20 Ca 8.6, alb 3.4

## 2022-06-06 ENCOUNTER — Encounter: Payer: Self-pay | Admitting: Infectious Disease

## 2022-06-06 ENCOUNTER — Other Ambulatory Visit: Payer: Self-pay

## 2022-06-06 ENCOUNTER — Ambulatory Visit (INDEPENDENT_AMBULATORY_CARE_PROVIDER_SITE_OTHER): Payer: Self-pay | Admitting: Infectious Disease

## 2022-06-06 VITALS — BP 143/88 | HR 92 | Temp 97.9°F | Wt 249.0 lb

## 2022-06-06 DIAGNOSIS — Z7185 Encounter for immunization safety counseling: Secondary | ICD-10-CM

## 2022-06-06 DIAGNOSIS — I10 Essential (primary) hypertension: Secondary | ICD-10-CM

## 2022-06-06 DIAGNOSIS — R87810 Cervical high risk human papillomavirus (HPV) DNA test positive: Secondary | ICD-10-CM

## 2022-06-06 DIAGNOSIS — B20 Human immunodeficiency virus [HIV] disease: Secondary | ICD-10-CM

## 2022-06-06 DIAGNOSIS — R87610 Atypical squamous cells of undetermined significance on cytologic smear of cervix (ASC-US): Secondary | ICD-10-CM

## 2022-06-06 HISTORY — DX: Encounter for immunization safety counseling: Z71.85

## 2022-06-06 NOTE — Progress Notes (Signed)
Subjective:  Chief complaint follow-up for HIV disease on medications  Patient ID: Monique [name], female    DOB: 1971-01-24, 51 y.o.   MRN: [id number]  HPI  Monique [name] is a 51 year old black woman has been perfectly controlled and BIKTARVY about to enroll into [employer] 052 study tomorrow.  She is back on her amlodipine with better control of blood pressure.  In reviewing her history she had an abnormal Pap smear in 2021 for which Monique [name] I referred her to gynecology.  I made another referral today for her and gave her paperwork to fill out and mail and to the breast center so that colposcopy and visit can be covered.     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   Lower extremity edema 01/12/2015   Lung abscess ([place])    Morbid obesity ([place])    Pneumonia    TB (tuberculosis)    Tonsillitis    Vaccine counseling 06/06/2022   Viral URI 09/16/2017   Weight gain 10/27/2018    Past Surgical History:  Procedure Laterality Date   CESAREAN SECTION     VIDEO BRONCHOSCOPY Bilateral 04/13/2013   Procedure: VIDEO BRONCHOSCOPY WITHOUT FLUORO;  Surgeon: Brand Males, MD;  Location: [facility] ENDOSCOPY;  Service: Cardiopulmonary;  Laterality: Bilateral;    Family History  Problem Relation Age of Onset   Diabetes Sister    Hypertension Sister    Hypertension Mother    Hypertension Father       Social History   Socioeconomic History   Marital status: Single    Spouse name: Not on file   Number of children: Not on file   Years of education: Not on file   Highest education level: Not on file  Occupational History   Not on file  Tobacco Use   Smoking status: Former    Types: Cigarettes    Quit date: 12/22/2013     Years since quitting: 8.4   Smokeless tobacco: Never  Vaping Use   Vaping Use: Never used  Substance and Sexual Activity   Alcohol use: No    Alcohol/week: 0.0 standard drinks of alcohol   Drug use: No   Sexual activity: Not Currently    Partners: Male    Birth control/protection: None  Other Topics Concern   Not on file  Social History Narrative   Not on file   Social Determinants of Health   Financial Resource Strain: Not on file  Food Insecurity: Not on file  Transportation Needs: Not on file  Physical Activity: Not on file  Stress: Not on file  Social Connections: Not on file    No Known Allergies   Current Outpatient Medications:    albuterol (VENTOLIN HFA) 108 (90 Base) MCG/ACT inhaler, Inhale 1-2 puffs into the lungs every 6 (six) hours as needed for wheezing or shortness of breath., Disp: 18 g, Rfl: 1   amLODipine (NORVASC) 10 MG tablet, TAKE 1 TABLET(10 MG) BY MOUTH DAILY, Disp: 30 tablet, Rfl: 11   aspirin EC 81 MG tablet, Take 1 tablet (81 mg total) by mouth daily. Swallow whole., Disp: 30 tablet, Rfl: 2   atorvastatin (LIPITOR) 20 MG tablet, Take 1 tablet (20 mg total) by mouth daily., Disp: 30 tablet, Rfl:  2   baclofen (LIORESAL) 10 MG tablet, Take 0.5-1 tablets (5-10 mg total) by mouth 3 (three) times daily as needed for muscle spasms., Disp: 30 each, Rfl: 0   benzonatate (TESSALON) 100 MG capsule, Take 1 capsule (100 mg total) by mouth every 8 (eight) hours., Disp: 21 capsule, Rfl: 0   bictegravir-emtricitabine-tenofovir AF (BIKTARVY) 50-200-25 MG TABS tablet, Take 1 tablet by mouth daily., Disp: 30 tablet, Rfl: 0   hydrochlorothiazide (HYDRODIURIL) 25 MG tablet, TAKE 1 TABLET(25 MG) BY MOUTH DAILY, Disp: 30 tablet, Rfl: 11   predniSONE (DELTASONE) 20 MG tablet, Take 2 tablets (40 mg total) by mouth daily., Disp: 10 tablet, Rfl: 0   promethazine-dextromethorphan (PROMETHAZINE-DM) 6.25-15 MG/5ML syrup, Take 5 mLs by mouth 4 (four) times daily as needed for cough.,  Disp: 118 mL, Rfl: 0   cetirizine (ZYRTEC ALLERGY) 10 MG tablet, Take 1 tablet (10 mg total) by mouth daily., Disp: 30 tablet, Rfl: 2   fluticasone (FLONASE) 50 MCG/ACT nasal spray, Place 2 sprays into both nostrils daily., Disp: 1 g, Rfl: 5    Review of Systems  Constitutional:  Negative for activity change, appetite change, chills, diaphoresis, fatigue, fever and unexpected weight change.  HENT:  Negative for congestion, rhinorrhea, sinus pressure, sneezing, sore throat and trouble swallowing.   Eyes:  Negative for photophobia and visual disturbance.  Respiratory:  Negative for cough, chest tightness, shortness of breath, wheezing and stridor.   Cardiovascular:  Negative for chest pain, palpitations and leg swelling.  Gastrointestinal:  Negative for abdominal distention, abdominal pain, anal bleeding, blood in stool, constipation, diarrhea, nausea and vomiting.  Genitourinary:  Negative for difficulty urinating, dysuria, flank pain and hematuria.  Musculoskeletal:  Negative for arthralgias, back pain, gait problem, joint swelling and myalgias.  Skin:  Negative for color change, pallor, rash and wound.  Neurological:  Negative for dizziness, tremors, weakness and light-headedness.  Hematological:  Negative for adenopathy. Does not bruise/bleed easily.  Psychiatric/Behavioral:  Negative for agitation, behavioral problems, confusion, decreased concentration, dysphoric mood and sleep disturbance.        Objective:   Physical Exam Constitutional:      General: She is not in acute distress.    Appearance: Normal appearance. She is well-developed. She is not ill-appearing or diaphoretic.  HENT:     Head: Normocephalic and atraumatic.     Right Ear: Hearing and external ear normal.     Left Ear: Hearing and external ear normal.     Nose: No nasal deformity or rhinorrhea.  Eyes:     General: No scleral icterus.    Conjunctiva/sclera: Conjunctivae normal.     Right eye: Right conjunctiva  is not injected.     Left eye: Left conjunctiva is not injected.     Pupils: Pupils are equal, round, and reactive to light.  Neck:     Vascular: No JVD.  Cardiovascular:     Rate and Rhythm: Normal rate and regular rhythm.     Heart sounds: Normal heart sounds, S1 normal and S2 normal. No murmur heard.    No friction rub.  Abdominal:     General: Bowel sounds are normal. There is no distension.     Palpations: Abdomen is soft.     Tenderness: There is no abdominal tenderness.  Musculoskeletal:        General: Normal range of motion.     Right shoulder: Normal.     Left shoulder: Normal.     Cervical back: Normal range of motion and  neck supple.     Right hip: Normal.     Left hip: Normal.     Right knee: Normal.     Left knee: Normal.  Lymphadenopathy:     Head:     Right side of head: No submandibular, preauricular or posterior auricular adenopathy.     Left side of head: No submandibular, preauricular or posterior auricular adenopathy.     Cervical: No cervical adenopathy.     Right cervical: No superficial or deep cervical adenopathy.    Left cervical: No superficial or deep cervical adenopathy.  Skin:    General: Skin is warm and dry.     Coloration: Skin is not pale.     Findings: No abrasion, bruising, ecchymosis, erythema, lesion or rash.     Nails: There is no clubbing.  Neurological:     General: No focal deficit present.     Mental Status: She is alert and oriented to person, place, and time.     Sensory: No sensory deficit.     Coordination: Coordination normal.     Gait: Gait normal.  Psychiatric:        Attention and Perception: She is attentive.        Mood and Affect: Mood normal.        Speech: Speech normal.        Behavior: Behavior normal. Behavior is cooperative.        Thought Content: Thought content normal.        Judgment: Judgment normal.           Assessment & Plan:  HIV disease:  Reviewed Glenda's labs with research which show her  viral load to be undetectable and CD4 count to be healthy.  She will continue Biktarvy today but tomorrow we will enter the [employer] 052 study which is a double blinded placebo controlled study of BIKTARVY versus Islatravir/Doravirine  Note she should only receive medications through the study and no longer see BIKTARVY prescriptions until she is out of the study  ASCUS: referred to Gynecology and gave her paperwork that she will need to fill out for assistance  Hyperlipidemia and cardiovascular risk we discussed the results of the reprieve study I would like to start her on pitavastatin 10 but I am going to hold off doing so until she is started her study medications  Vaccine counseling: Recommended flu shot and we were going to get today but were waiting to see if it was okay to give prior to the study.  It turns out was okay to give prior to stay with the patient and already left.  Hypertension better controlled now on amlodipine which we will continue

## 2022-06-07 ENCOUNTER — Other Ambulatory Visit: Payer: Self-pay

## 2022-06-07 ENCOUNTER — Encounter (INDEPENDENT_AMBULATORY_CARE_PROVIDER_SITE_OTHER): Payer: Self-pay

## 2022-06-07 VITALS — BP 155/101 | HR 81 | Temp 98.0°F | Resp 16 | Ht 62.6 in | Wt 248.2 lb

## 2022-06-07 DIAGNOSIS — Z006 Encounter for examination for normal comparison and control in clinical research program: Secondary | ICD-10-CM

## 2022-06-07 MED ORDER — STUDY - MK-8591A-052 - MK-8591A 100/0.25 MG OR PLACEBO TABLET (PI-VAN DAM): 1.0000 | ORAL_TABLET | Freq: Every day | ORAL | 0 refills | Status: DC

## 2022-06-07 MED ORDER — STUDY - MK-8591A-052 - BICTEGRAVIR/EMTRICITABINE/TENOFOVIR ALAFENAMIDE (BIKTARVY) 50-200-25 MG OR PLACEBO TABLET (PI-VAN DAM): 1.0000 | ORAL_TABLET | Freq: Every day | ORAL | 0 refills | Status: DC

## 2022-06-07 NOTE — Research (Signed)
Participant seen for Day 1 Entry Visit for Study [id number], A Phase 3, Randomized, Active-Controlled, Double-Blind Clinical Study to Evaluate a Switch to Doravirine/Islatravir (DOR/ISL 100 mg/0.25 mg) Once-Daily in Participants With HIV-1 Who Are Virologically Suppressed on Bictegravir/Emtricitabine/Tenofovir Alafenamide (BIC/FTC/TAF). Procedures carried out per protocol. Blood pressure elevated, did not take HTN meds this morning.  Denies any headache, dizziness, SOB, chest pain. Educated to seek medical care urgently if symtpoms arise. Reports she is planning to see PCP in a few weeks for yearly physical. Study medications dispensed. Plan to see again in 4 weeks.

## 2022-07-05 ENCOUNTER — Other Ambulatory Visit: Payer: Self-pay

## 2022-07-05 ENCOUNTER — Encounter (INDEPENDENT_AMBULATORY_CARE_PROVIDER_SITE_OTHER): Payer: Self-pay | Admitting: *Deleted

## 2022-07-05 DIAGNOSIS — Z006 Encounter for examination for normal comparison and control in clinical research program: Secondary | ICD-10-CM

## 2022-07-05 DIAGNOSIS — I10 Essential (primary) hypertension: Secondary | ICD-10-CM

## 2022-07-05 MED ORDER — HYDROCHLOROTHIAZIDE 25 MG PO TABS: ORAL_TABLET | ORAL | 11 refills | Status: DC

## 2022-07-05 MED ORDER — STUDY - MK-8591A-052 - MK-8591A 100/0.25 MG OR PLACEBO TABLET (PI-VAN DAM): 1.0000 | ORAL_TABLET | Freq: Every day | ORAL | 0 refills | Status: DC

## 2022-07-05 MED ORDER — STUDY - MK-8591A-052 - BICTEGRAVIR/EMTRICITABINE/TENOFOVIR ALAFENAMIDE (BIKTARVY) 50-200-25 MG OR PLACEBO TABLET (PI-VAN DAM): 1.0000 | ORAL_TABLET | Freq: Every day | ORAL | 0 refills | Status: DC

## 2022-07-05 NOTE — Telephone Encounter (Signed)
  hydrochlorothiazide (HYDRODIURIL) 25 MG tablet, REFILL REQUEST @  [id number] - [place], [place] - [address] AT [facility] [address].

## 2022-07-05 NOTE — Research (Unsigned)
Monique [name] here for her week 4 visit for [id number] study. Adherence with study medication was 82% by pill count. Reviewed the importance of taking her medication everyday at the same time. Provided her with a pill box to help assist with daily dosing. Denied any adverse effects from study medication. BP 148/90.States that she has not taken her amlodipine this morning. Has not been taking her HCTZ because she ran out and was not sure that she needed to continue it. Discussed the need to schedule an appointment with there PCP. Agreed and plans to call today for an appointment. She will return in January for her next study visit.

## 2022-07-12 ENCOUNTER — Ambulatory Visit (HOSPITAL_COMMUNITY)
Admission: EM | Admit: 2022-07-12 | Discharge: 2022-07-12 | Disposition: A | Discharge status: Home / Self Care | Payer: No Typology Code available for payment source | Attending: Sports Medicine | Admitting: Sports Medicine

## 2022-07-12 ENCOUNTER — Encounter (HOSPITAL_COMMUNITY): Payer: Self-pay | Admitting: *Deleted

## 2022-07-12 ENCOUNTER — Other Ambulatory Visit: Payer: Self-pay | Admitting: Sports Medicine

## 2022-07-12 DIAGNOSIS — R0981 Nasal congestion: Secondary | ICD-10-CM

## 2022-07-12 DIAGNOSIS — J069 Acute upper respiratory infection, unspecified: Secondary | ICD-10-CM | POA: Insufficient documentation

## 2022-07-12 DIAGNOSIS — Z79899 Other long term (current) drug therapy: Secondary | ICD-10-CM | POA: Insufficient documentation

## 2022-07-12 DIAGNOSIS — U071 COVID-19: Secondary | ICD-10-CM | POA: Diagnosis not present

## 2022-07-12 LAB — RESP PANEL BY RT-PCR (RSV, FLU A&B, COVID)  RVPGX2
Influenza A by PCR: NEGATIVE
Influenza B by PCR: NEGATIVE
Resp Syncytial Virus by PCR: NEGATIVE
SARS Coronavirus 2 by RT PCR: POSITIVE — AB

## 2022-07-12 MED ORDER — FLUTICASONE PROPIONATE 50 MCG/ACT NA SUSP: 2.0000 | Freq: Every day | NASAL | 0 refills | Status: AC

## 2022-07-12 MED ORDER — ALBUTEROL SULFATE HFA 108 (90 BASE) MCG/ACT IN AERS: 1.0000 | INHALATION_SPRAY | Freq: Four times a day (QID) | RESPIRATORY_TRACT | 1 refills | Status: DC | PRN

## 2022-07-12 NOTE — ED Provider Notes (Signed)
[facility]    CSN: [id number] Arrival date & time: 07/12/22  2426      History   Chief Complaint Chief Complaint  Patient presents with   Nasal Congestion   Facial Pain    HPI Monique [name] is a 51 y.o. female.   She presents today with chief complaint of facial congestion and pressure.  She reports this began on Sunday when she returned home after church.  She is a very infrequent cough.  She denies any fevers, chest pain or shortness of breath.  She has been using her albuterol inhaler and requesting a refill of that.  She feels been using DayQuil and NyQuil which helps resolve her symptoms in the short-term but they return.  She was unable to go to work last night as when she was getting ready she began feeling very fatigued.  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  She has not yet done any at home COVID testing.     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   Lower extremity edema 01/12/2015   Lung abscess ([place])    Morbid obesity ([place])    Pneumonia    TB (tuberculosis)    Tonsillitis    Vaccine counseling 06/06/2022   Viral URI 09/16/2017   Weight gain 10/27/2018    Patient Active Problem List   Diagnosis Date Noted   Vaccine counseling 06/06/2022   ASCUS with positive high risk HPV cervical 07/10/2021   Asthma 07/10/2021   Finger numbness 07/10/2021   Hyperlipidemia 07/06/2021   Screening for cervical cancer 04/24/2020   Encounter for screening mammogram for malignant neoplasm of breast 04/24/2020   Morbid obesity ([place])    Seasonal allergies 01/26/2019   Healthcare maintenance 05/19/2018   Exertional chest pain 05/13/2017   Obese 08/10/2015    Essential hypertension 01/12/2015   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      Home Medications    Prior to Admission medications   Medication Sig Start Date End Date Taking? Authorizing Provider  amLODipine (NORVASC) 10 MG tablet TAKE 1 TABLET(10 MG) BY MOUTH DAILY 05/25/22  Yes Tommy Medal, Lavell Islam, MD  aspirin EC 81 MG tablet Take 1 tablet (81 mg total) by mouth daily. Swallow whole. 07/06/21  Yes Jose Persia, MD  Study [id number] - bictegravir/emtrictabine/tenofovir alafenamide (BIKTARVY) 50-200-25 mg or placebo tablet ([place]) Take 1 tablet by mouth daily. 07/05/22  Yes Marchelle Gearing  Study - [id number] - DE-0814G 100/0.25 mg or placebo tablet ([place]) Take 1 tablet by mouth daily. 07/05/22  Yes Robert Bellow, PA-C  albuterol (VENTOLIN HFA) 108 (90 Base) MCG/ACT inhaler Inhale 1-2 puffs into the lungs every 6 (six) hours as needed for wheezing or shortness of breath. 07/12/22   Elmore Guise, DO  atorvastatin (LIPITOR) 20 MG tablet Take 1 tablet (20 mg total) by mouth daily. 07/06/21 07/06/22  Jose Persia, MD  baclofen (LIORESAL) 10 MG tablet Take 0.5-1 tablets (5-10 mg total) by mouth 3 (three) times daily as needed for muscle spasms. 03/28/22   Abner Greenspan, Amy L, PA  benzonatate (TESSALON) 100 MG capsule Take 1 capsule (100 mg total) by mouth every 8 (eight) hours. 01/23/22   Hans Eden, NP  cetirizine (ZYRTEC ALLERGY) 10 MG tablet Take 1 tablet (10 mg total) by mouth daily. 12/24/19 12/23/20  [place], Carley D, DO  fluticasone  (FLONASE) 50 MCG/ACT nasal spray Place 2 sprays into both nostrils daily. 07/12/22 08/11/22  Rodena Goldmann A, DO  hydrochlorothiazide (HYDRODIURIL) 25 MG tablet TAKE 1 TABLET(25 MG) BY MOUTH DAILY 07/05/22   Johny Blamer, DO  predniSONE (DELTASONE) 20 MG tablet Take 2 tablets (40 mg total) by mouth daily. 05/01/22   Vanessa Kick, MD  promethazine-dextromethorphan (PROMETHAZINE-DM) 6.25-15 MG/5ML syrup Take 5 mLs by mouth 4 (four) times daily as needed for cough. 05/01/22   Vanessa Kick, MD    Family History Family History  Problem Relation Age of Onset   Diabetes Sister    Hypertension Sister    Hypertension Mother    Hypertension Father     Social History Social History   Tobacco Use   Smoking status: Former    Types: Cigarettes    Quit date: 12/22/2013    Years since quitting: 8.5   Smokeless tobacco: Never  Vaping Use   Vaping Use: Never used  Substance Use Topics   Alcohol use: No    Alcohol/week: 0.0 standard drinks of alcohol   Drug use: No     Allergies   Patient has no known allergies.   Review of Systems Review of Systems As listed above in HPI  Physical Exam Triage Vital Signs ED Triage Vitals  Enc Vitals Group     BP 07/12/22 1050 (!) 158/116     Pulse Rate 07/12/22 1050 83     Resp 07/12/22 1050 20     Temp 07/12/22 1050 98.8 F (37.1 C)     Temp Source 07/12/22 1050 Oral     SpO2 07/12/22 1050 94 %     Weight --      Height --      Head Circumference --      Peak Flow --      Pain Score 07/12/22 1046 6     Pain Loc --      Pain Edu? --      Excl. in [place]? --    No data found.  Updated Vital Signs BP (!) 158/116 (BP Location: Left Arm)   Pulse 83   Temp 98.8 F (37.1 C) (Oral)   Resp 20   LMP 06/22/2022 (Approximate)   SpO2 94%   Physical Exam Vitals reviewed.  Constitutional:      General: She is not in acute distress.    Appearance: Normal appearance. She is obese. She is not ill-appearing, toxic-appearing or diaphoretic.   HENT:     Head: Normocephalic.     Nose: Congestion present.     Comments: Tenderness to palpation of maxillary sinuses Eyes:     General:        Right eye: No discharge.        Left eye: No discharge.     Conjunctiva/sclera: Conjunctivae normal.  Cardiovascular:     Rate and Rhythm: Normal rate.  Pulses: Normal pulses.     Heart sounds: No murmur heard. Pulmonary:     Effort: Pulmonary effort is normal. No respiratory distress.     Breath sounds: No stridor. No wheezing or rales.  Skin:    General: Skin is warm.  Neurological:     Mental Status: She is alert.      UC Treatments / Results  Labs (all labs ordered are listed, but only abnormal results are displayed) Labs Reviewed  RESP PANEL BY RT-PCR (RSV, FLU A&B, COVID)  [id number]    EKG   Radiology No results found.  Procedures Procedures (including critical care time)  Medications Ordered in UC Medications - No data to display  Initial Impression / Assessment and Plan / UC Course  I have reviewed the triage vital signs and the nursing notes.  Pertinent labs & imaging results that were available during my care of the patient were reviewed by me and considered in my medical decision making (see chart for details).    Patient is here today with facial pressure and fatigue.  COVID RSV and flu testing performed today.  Patient is a candidate for Paxlovid as her symptoms began less than 5 days ago and she has risk factors diabetes, hypertension, obesity, HIV.  eGFR greater than 60 on recent labs 04/2022.  I have sent to her pharmacy Flonase and refilled her albuterol inhaler for supportive treatment.  Recommend rest and adequate hydration.  She may continue over-the-counter DayQuil and NyQuil as needed.  Work note provided.  If her testing is positive urgent care staff will call her with results and can send medications at that time if patient still wishes to proceed with treatment. Final Clinical Impressions(s) / UC  Diagnoses   Final diagnoses:  Nasal congestion   Discharge Instructions   None    ED Prescriptions     Medication Sig Dispense Auth. Provider   albuterol (VENTOLIN HFA) 108 (90 Base) MCG/ACT inhaler Inhale 1-2 puffs into the lungs every 6 (six) hours as needed for wheezing or shortness of breath. 18 g Rodena Goldmann A, DO   fluticasone (FLONASE) 50 MCG/ACT nasal spray Place 2 sprays into both nostrils daily. 1 g Rodena Goldmann A, DO      PDMP not reviewed this encounter.   Elmore Guise, DO 07/12/22 1119

## 2022-07-12 NOTE — Discharge Instructions (Addendum)
Continue with supportive care, I have also sent Flonase and albuterol inhaler refill to your pharmacy.  Follow-up at urgent care or your primary care provider if you have no improvement over the next 7 days.

## 2022-07-12 NOTE — ED Triage Notes (Signed)
Pt states she has congestion, sinus pressure denies any coughing since Sunday. It does get better with OTC meds Nyquil and dayquil but they it comes back. She has been using her Albuterol MDI and needs a refill.

## 2022-07-16 ENCOUNTER — Encounter: Payer: Self-pay | Admitting: Student

## 2022-07-16 ENCOUNTER — Telehealth: Payer: Self-pay | Admitting: *Deleted

## 2022-07-16 NOTE — Telephone Encounter (Signed)
Called patient unable to leave message / voice mail full- patient did not keep appointment. Patient can call the main number at [phone].

## 2022-08-24 ENCOUNTER — Ambulatory Visit (HOSPITAL_COMMUNITY)
Admission: EM | Admit: 2022-08-24 | Discharge: 2022-08-24 | Disposition: A | Discharge status: Home / Self Care | Payer: Self-pay | Attending: Internal Medicine | Admitting: Internal Medicine

## 2022-08-24 ENCOUNTER — Encounter (HOSPITAL_COMMUNITY): Payer: Self-pay | Admitting: Emergency Medicine

## 2022-08-24 DIAGNOSIS — R109 Unspecified abdominal pain: Secondary | ICD-10-CM

## 2022-08-24 DIAGNOSIS — S39012A Strain of muscle, fascia and tendon of lower back, initial encounter: Secondary | ICD-10-CM

## 2022-08-24 MED ORDER — BACLOFEN 10 MG PO TABS: 10.0000 mg | ORAL_TABLET | Freq: Every evening | ORAL | 0 refills | Status: DC | PRN

## 2022-08-24 MED ORDER — TRAMADOL HCL 50 MG PO TABS: 50.0000 mg | ORAL_TABLET | Freq: Two times a day (BID) | ORAL | 0 refills | Status: AC | PRN

## 2022-08-24 NOTE — Discharge Instructions (Signed)
Apply warm compresses intermittently as needed.   As pain recedes, begin normal activities slowly as tolerated.   Please take medications as prescribed Please do not drive or operate heavy machinery after taking muscle relaxants-this will make you drowsy. Return to urgent care if symptoms worsen.

## 2022-08-24 NOTE — ED Triage Notes (Signed)
Pt reports left flank and lower back pain. States pain has been intermittent since Monday. Denies urinary frequency and dysuria.

## 2022-08-27 NOTE — ED Provider Notes (Signed)
[place]    CSN: [id number] Arrival date & time: 08/24/22  1049      History   Chief Complaint Chief Complaint  Patient presents with   Back Pain   Flank Pain    HPI Monique [name] is a 52 y.o. female comes to the urgent care with low back pain and left flank pain which started 3 to 4 days ago.  Patient denies any trauma or falls.  Pain is sharp, intermittent and aggravated by standing for prolonged period of time.  No urinary or bladder problems.  No dysuria, urgency or frequency.  No heavy lifting.  No numbness or tingling in the lower extremities.  No radiation of pain to the lower extremities. 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   Lower extremity edema 01/12/2015   Lung abscess ([place])    Morbid obesity ([place])    Pneumonia    TB (tuberculosis)    Tonsillitis    Vaccine counseling 06/06/2022   Viral URI 09/16/2017   Weight gain 10/27/2018    Patient Active Problem List   Diagnosis Date Noted   Vaccine counseling 06/06/2022   ASCUS with positive high risk HPV cervical 07/10/2021   Asthma 07/10/2021   Finger numbness 07/10/2021   Hyperlipidemia 07/06/2021   Screening for cervical cancer 04/24/2020   Encounter for screening mammogram for malignant neoplasm of breast 04/24/2020   Morbid obesity ([place])    Seasonal allergies 01/26/2019   Healthcare maintenance 05/19/2018   Exertional chest pain 05/13/2017   Obese 08/10/2015   Essential hypertension 01/12/2015   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      Home Medications    Prior to Admission medications   Medication Sig Start Date End Date Taking? Authorizing Provider  traMADol (ULTRAM) 50 MG tablet Take 1 tablet (50 mg total) by mouth every 12 (twelve) hours as needed for up to 5 days. 08/24/22 08/29/22 Yes Daisi Kentner, Myrene Galas, MD  albuterol (VENTOLIN HFA) 108 (90 Base) MCG/ACT inhaler Inhale 1-2 puffs into the lungs every 6 (six) hours as needed for wheezing or shortness of breath. 07/12/22   Rodena Goldmann A, DO  amLODipine (NORVASC) 10 MG tablet TAKE 1 TABLET(10 MG) BY MOUTH DAILY 05/25/22   Tommy Medal, Lavell Islam, MD  aspirin EC 81 MG tablet Take 1 tablet (81 mg total) by mouth daily. Swallow whole. 07/06/21   Jose Persia, MD  atorvastatin (LIPITOR) 20 MG tablet Take 1 tablet (20 mg total) by mouth daily. 07/06/21 07/06/22  Jose Persia, MD  baclofen (LIORESAL) 10 MG tablet Take 1 tablet (10 mg total) by mouth at bedtime as needed for muscle spasms. 08/24/22   Rande Dario, Myrene Galas, MD  benzonatate (TESSALON) 100 MG capsule Take 1 capsule (100 mg total) by mouth every 8 (eight) hours. 01/23/22   White,  Leitha Schuller, NP  cetirizine (ZYRTEC ALLERGY) 10 MG tablet Take 1 tablet (10 mg total) by mouth daily. 12/24/19 12/23/20  [place], Carley D, DO  fluticasone (FLONASE) 50 MCG/ACT nasal spray Place 2 sprays into both nostrils daily. 07/12/22 08/11/22  Rodena Goldmann A, DO  hydrochlorothiazide (HYDRODIURIL) 25 MG tablet TAKE 1 TABLET(25 MG) BY MOUTH DAILY 07/05/22   Johny Blamer, DO  predniSONE (DELTASONE) 20 MG tablet Take 2 tablets (40 mg total) by mouth daily. 05/01/22   Vanessa Kick, MD  promethazine-dextromethorphan (PROMETHAZINE-DM) 6.25-15 MG/5ML syrup Take 5 mLs by mouth 4 (four) times daily as  needed for cough. 05/01/22   Vanessa Kick, MD  Study [phone] - bictegravir/emtrictabine/tenofovir alafenamide (BIKTARVY) 50-200-25 mg or placebo tablet ([place]) Take 1 tablet by mouth daily. 07/05/22   Robert Bellow, PA-C  Study [phone] - [id number] 100/0.25 mg or placebo tablet ([facility]) Take 1 tablet by mouth daily. 07/05/22   Robert Bellow, PA-C    Family History Family History  Problem Relation Age of Onset   Diabetes Sister    Hypertension Sister    Hypertension Mother    Hypertension Father     Social History Social History   Tobacco Use   Smoking status: Former    Types: Cigarettes    Quit date: 12/22/2013    Years since quitting: 8.6   Smokeless tobacco: Never  Vaping Use   Vaping Use: Never used  Substance Use Topics   Alcohol use: No    Alcohol/week: 0.0 standard drinks of alcohol   Drug use: No     Allergies   Patient has no known allergies.   Review of Systems Review of Systems As per HPI  Physical Exam Triage Vital Signs ED Triage Vitals  Enc Vitals Group     BP 08/24/22 1315 (!) 164/112     Pulse Rate 08/24/22 1315 87     Resp 08/24/22 1315 18     Temp 08/24/22 1315 98 F (36.7 C)     Temp Source 08/24/22 1315 Oral     SpO2 08/24/22 1315 95 %     Weight --      Height --      Head Circumference --      Peak Flow --      Pain Score 08/24/22 1314 8     Pain Loc --      Pain Edu? --      Excl. in [place]? --    No data found.  Updated Vital Signs BP (!) 164/112 (BP Location: Left Arm)   Pulse 87   Temp 98 F (36.7 C) (Oral)   Resp 18   SpO2 95%   Visual Acuity Right Eye Distance:   Left Eye Distance:   Bilateral Distance:    Right Eye Near:   Left Eye Near:    Bilateral Near:     Physical Exam Vitals and nursing note reviewed.  Constitutional:      Appearance: Normal appearance.  Cardiovascular:     Rate and Rhythm: Normal rate and regular rhythm.     Pulses: Normal pulses.     Heart sounds: Normal  heart sounds.  Pulmonary:     Effort: Pulmonary effort is normal.     Breath sounds: Normal breath sounds.  Musculoskeletal:        General: Tenderness present. No swelling. Normal range of motion.     Comments: Tenderness on palpation of the paraspinal muscles in the lumbosacral region.  Neurological:     General: No focal deficit present.     Mental Status: She is alert and oriented to person, place, and time.      UC Treatments / Results  Labs (all labs ordered are listed, but only abnormal results are displayed) Labs Reviewed - No data to display  EKG   Radiology No results found.  Procedures Procedures (including critical care time)  Medications Ordered in UC Medications - No data to display  Initial Impression / Assessment and Plan / UC Course  I have reviewed the triage vital signs and the nursing notes.  Pertinent labs & imaging results that were available during my care of the patient were reviewed by me and considered in my medical decision making (see chart for details).     1.  Lower back strain: Gentle range of motion exercises Baclofen at bedtime as needed for muscle spasms-medication precautions given Tramadol 50 mg every 12 hours as needed for pain No indication for imaging at this time Return precautions given. Final Clinical Impressions(s) / UC Diagnoses   Final diagnoses:  Back strain, initial encounter     Discharge Instructions      Apply warm compresses intermittently as needed.   As pain recedes, begin normal activities slowly as tolerated.   Please take medications as prescribed Please do not drive or operate heavy machinery after taking muscle relaxants-this will make you drowsy. Return to urgent care if symptoms worsen.   ED Prescriptions     Medication Sig Dispense Auth. Provider   baclofen (LIORESAL) 10 MG tablet Take 1 tablet (10 mg total) by mouth at bedtime as needed for muscle spasms. 10 each Carnella Fryman, Britta Mccreedy, MD    traMADol (ULTRAM) 50 MG tablet Take 1 tablet (50 mg total) by mouth every 12 (twelve) hours as needed for up to 5 days. 10 tablet Jasmon Graffam, Britta Mccreedy, MD      I have reviewed the PDMP during this encounter.   Merrilee Jansky, MD 08/27/22 [phone]

## 2022-08-28 ENCOUNTER — Encounter: Payer: Self-pay | Admitting: *Deleted

## 2022-09-04 ENCOUNTER — Encounter (INDEPENDENT_AMBULATORY_CARE_PROVIDER_SITE_OTHER): Payer: Self-pay | Admitting: *Deleted

## 2022-09-04 ENCOUNTER — Other Ambulatory Visit: Payer: Self-pay

## 2022-09-04 DIAGNOSIS — Z006 Encounter for examination for normal comparison and control in clinical research program: Secondary | ICD-10-CM

## 2022-09-04 MED ORDER — STUDY - MK-8591A-052 - MK-8591A 100/0.25 MG OR PLACEBO TABLET (PI-VAN DAM): 1.0000 | ORAL_TABLET | Freq: Every day | ORAL | 0 refills | Status: DC

## 2022-09-04 MED ORDER — STUDY - MK-8591A-052 - BICTEGRAVIR/EMTRICITABINE/TENOFOVIR ALAFENAMIDE (BIKTARVY) 50-200-25 MG OR PLACEBO TABLET (PI-VAN DAM): 1.0000 | ORAL_TABLET | Freq: Every day | ORAL | 0 refills | Status: DC

## 2022-09-04 NOTE — Research (Signed)
Monique [name] seen today for her week 12 visit for [id number] study. She missed multiple doses of her study medication and by pill count it looks like she missed more of her BIK/Placebo (adherence 61%) than her DOR/ISL/Placebo (adherence 71%). We reviewed the importance of taking both medications together at the same time everyday. She has a pill box that I helped her refill today. I will plan to check in with her next week to see how she is doing with taking her medications and refilling her pill box.  BP was elevated today but she states that she had not taken her BP medication this morning. Denied any chest pain, dizziness or visual changes. She had Covid in November. Continues to have a cough and some shortness of breath with activity. Encouraged her to follow up with the PCP. She will return in March for her next study visit.

## 2022-10-02 ENCOUNTER — Encounter (HOSPITAL_COMMUNITY): Payer: Self-pay

## 2022-10-02 ENCOUNTER — Ambulatory Visit (HOSPITAL_COMMUNITY)
Admission: EM | Admit: 2022-10-02 | Discharge: 2022-10-02 | Disposition: A | Discharge status: Home / Self Care | Payer: Self-pay | Attending: Family Medicine | Admitting: Family Medicine

## 2022-10-02 DIAGNOSIS — H01001 Unspecified blepharitis right upper eyelid: Secondary | ICD-10-CM

## 2022-10-02 MED ORDER — AMOXICILLIN-POT CLAVULANATE 875-125 MG PO TABS: 1.0000 | ORAL_TABLET | Freq: Two times a day (BID) | ORAL | 0 refills | Status: DC

## 2022-10-02 MED ORDER — MOXIFLOXACIN HCL 0.5 % OP SOLN: 1.0000 [drp] | Freq: Three times a day (TID) | OPHTHALMIC | 0 refills | Status: DC

## 2022-10-02 NOTE — ED Triage Notes (Signed)
Patient reports that she woke yesterday with right eye swelling and redness yesterday. Today, patient states she continues to have redness, swelling, and purulent drainage.   Patient states she used Visine eye gtts yesterday.

## 2022-10-02 NOTE — ED Provider Notes (Signed)
[place]    CSN: [id number] Arrival date & time: 10/02/22  0805      History   Chief Complaint Chief Complaint  Patient presents with   Eye Pain    HPI Monique [name] is a 52 y.o. female.   Patient is here for right eye issues.  2 night ago she started with the right eye being painful/burning.   Slight upper eyelid swelling, thought it was a stye. The nurse washed it out, used visine, and used a cool ice pack.  Yesterday she tried a warm compress.  Last night the eyelids were swollen, draining yellow/green d/c.  This morning the eyelid was swollen shut.  Matted. She is having some pain.  No blurry vision.         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   Lower extremity edema 01/12/2015   Lung abscess ([place])    Morbid obesity ([place])    Pneumonia    TB (tuberculosis)    Tonsillitis    Vaccine counseling 06/06/2022   Viral URI 09/16/2017   Weight gain 10/27/2018    Patient Active Problem List   Diagnosis Date Noted   Vaccine counseling 06/06/2022   ASCUS with positive high risk HPV cervical 07/10/2021   Asthma 07/10/2021   Finger numbness 07/10/2021   Hyperlipidemia 07/06/2021   Screening for cervical cancer 04/24/2020   Encounter for screening mammogram for malignant neoplasm of breast 04/24/2020   Morbid obesity ([place])    Seasonal allergies 01/26/2019   Healthcare maintenance 05/19/2018   Exertional chest pain 05/13/2017   Obese 08/10/2015   Essential hypertension 01/12/2015   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      Home Medications    Prior to Admission medications   Medication Sig Start Date End Date Taking? Authorizing Provider  albuterol (VENTOLIN HFA) 108 (90 Base) MCG/ACT inhaler Inhale 1-2 puffs into the lungs every 6 (six) hours as needed for wheezing or shortness of breath. 07/12/22   Rodena Goldmann A, DO  amLODipine (NORVASC) 10 MG tablet TAKE 1 TABLET(10 MG) BY MOUTH DAILY 05/25/22   Tommy Medal, Lavell Islam, MD  aspirin EC 81 MG tablet Take 1 tablet (81 mg total) by mouth daily. Swallow whole. 07/06/21   Jose Persia, MD  atorvastatin (LIPITOR) 20 MG tablet Take 1 tablet (20 mg total) by mouth daily. 07/06/21 07/06/22  Jose Persia, MD  baclofen (LIORESAL) 10 MG tablet Take 1 tablet (10 mg total) by mouth at bedtime as needed for muscle spasms. 08/24/22   Lamptey, Myrene Galas, MD  benzonatate (TESSALON) 100 MG capsule Take 1 capsule (100 mg total) by mouth every 8 (eight) hours. 01/23/22   Hans Eden, NP  cetirizine (ZYRTEC ALLERGY) 10 MG tablet Take 1 tablet (10 mg total) by mouth daily. 12/24/19 12/23/20  Modena Nunnery  D, DO  fluticasone (FLONASE) 50 MCG/ACT nasal spray Place 2 sprays into both nostrils daily. 07/12/22 08/11/22  Rodena Goldmann A, DO  hydrochlorothiazide (HYDRODIURIL) 25 MG tablet TAKE 1 TABLET(25 MG) BY MOUTH DAILY 07/05/22   Johny Blamer, DO  predniSONE (DELTASONE) 20 MG tablet Take 2 tablets (40 mg total) by mouth daily. 05/01/22   Vanessa Kick, MD  promethazine-dextromethorphan (PROMETHAZINE-DM) 6.25-15 MG/5ML syrup Take 5 mLs by mouth 4 (four) times daily as needed for cough. 05/01/22   Vanessa Kick, MD  Study [phone] - bictegravir/emtrictabine/tenofovir alafenamide (BIKTARVY) 50-200-25 mg or  placebo tablet ([facility]) Take 1 tablet by mouth daily. 09/04/22   Robert Bellow, PA-C  Study [phone] - [id number] 100/0.25 mg or placebo tablet ([facility]) Take 1 tablet by mouth daily. 09/04/22   Robert Bellow, PA-C    Family History Family History  Problem Relation Age of Onset   Diabetes Sister    Hypertension Sister    Hypertension Mother    Hypertension Father     Social History Social History   Tobacco Use   Smoking status: Former    Types: Cigarettes    Quit date: 12/22/2013    Years since quitting: 8.7   Smokeless tobacco: Never  Vaping Use   Vaping Use: Never used  Substance Use Topics   Alcohol use: No    Alcohol/week: 0.0 standard drinks of alcohol   Drug use: No     Allergies   Patient has no known allergies.   Review of Systems Review of Systems  Constitutional: Negative.   HENT: Negative.    Eyes:  Positive for pain, discharge and redness. Negative for visual disturbance.  Respiratory: Negative.    Cardiovascular: Negative.   Gastrointestinal: Negative.   Musculoskeletal: Negative.      Physical Exam Triage Vital Signs ED Triage Vitals  Enc Vitals Group     BP 10/02/22 0829 135/84     Pulse Rate 10/02/22 0829 89     Resp 10/02/22 0829 18     Temp 10/02/22 0829 98.5 F (36.9 C)     Temp Source 10/02/22 0829 Oral     SpO2 10/02/22 0829 94 %     Weight --      Height --      Head Circumference --      Peak Flow --      Pain Score 10/02/22 0833 9     Pain Loc --      Pain Edu? --      Excl. in [place]? --    No data found.  Updated Vital Signs BP 135/84 (BP Location: Left Arm)   Pulse 89   Temp 98.5 F (36.9 C) (Oral)   Resp 18   LMP 09/26/2022 (Approximate)   SpO2 94%   Visual Acuity Right Eye Distance:   Left Eye Distance:   Bilateral Distance:    Right Eye Near:   Left Eye Near:    Bilateral Near:     Physical Exam Constitutional:      Appearance: Normal appearance.  Eyes:     Comments: The right upper  eyelid is swollen;  the conjunctiva is red, injected.  The sclera appears normal;  there is yellow/green drainage noted to the eye lashes  Cardiovascular:     Rate and Rhythm: Normal rate.  Pulmonary:     Effort: Pulmonary effort is normal.  Neurological:     General: No focal deficit present.     Mental  Status: She is alert.  Psychiatric:        Mood and Affect: Mood normal.      UC Treatments / Results  Labs (all labs ordered are listed, but only abnormal results are displayed) Labs Reviewed - No data to display  EKG   Radiology No results found.  Procedures Procedures (including critical care time)  Medications Ordered in UC Medications - No data to display  Initial Impression / Assessment and Plan / UC Course  I have reviewed the triage vital signs and the nursing notes.  Pertinent labs & imaging results that were available during my care of the patient were reviewed by me and considered in my medical decision making (see chart for details).   Final Clinical Impressions(s) / UC Diagnoses   Final diagnoses:  Blepharitis of right upper eyelid, unspecified type     Discharge Instructions      You were seen today for eyelid swelling.  I have sent out an oral antibiotic and eye drop to use the next 7 days.  You may also use a cool compress to help with swelling.  If not improving or if worsening then please go to the ER for further evaluation.      ED Prescriptions     Medication Sig Dispense Auth. Provider   moxifloxacin (VIGAMOX) 0.5 % ophthalmic solution Place 1 drop into the right eye 3 (three) times daily. 3 mL Darren Caldron, MD   amoxicillin-clavulanate (AUGMENTIN) 875-125 MG tablet Take 1 tablet by mouth every 12 (twelve) hours. 14 tablet Rondel Oh, MD      PDMP not reviewed this encounter.   Rondel Oh, MD 10/02/22 [phone]

## 2022-10-02 NOTE — Discharge Instructions (Addendum)
You were seen today for eyelid swelling.  I have sent out an oral antibiotic and eye drop to use the next 7 days.  You may also use a cool compress to help with swelling.  If not improving or if worsening then please go to the ER for further evaluation.

## 2022-11-03 ENCOUNTER — Ambulatory Visit (HOSPITAL_COMMUNITY)
Admission: EM | Admit: 2022-11-03 | Discharge: 2022-11-03 | Disposition: A | Discharge status: Home / Self Care | Payer: BLUE CROSS/BLUE SHIELD | Attending: Physician Assistant | Admitting: Physician Assistant

## 2022-11-03 ENCOUNTER — Other Ambulatory Visit: Payer: Self-pay

## 2022-11-03 ENCOUNTER — Ambulatory Visit (INDEPENDENT_AMBULATORY_CARE_PROVIDER_SITE_OTHER): Payer: BLUE CROSS/BLUE SHIELD

## 2022-11-03 ENCOUNTER — Encounter (HOSPITAL_COMMUNITY): Payer: Self-pay | Admitting: Emergency Medicine

## 2022-11-03 DIAGNOSIS — R5381 Other malaise: Secondary | ICD-10-CM | POA: Insufficient documentation

## 2022-11-03 DIAGNOSIS — J069 Acute upper respiratory infection, unspecified: Secondary | ICD-10-CM

## 2022-11-03 DIAGNOSIS — Z1152 Encounter for screening for COVID-19: Secondary | ICD-10-CM | POA: Insufficient documentation

## 2022-11-03 DIAGNOSIS — Z87891 Personal history of nicotine dependence: Secondary | ICD-10-CM | POA: Insufficient documentation

## 2022-11-03 DIAGNOSIS — B20 Human immunodeficiency virus [HIV] disease: Secondary | ICD-10-CM | POA: Insufficient documentation

## 2022-11-03 DIAGNOSIS — R059 Cough, unspecified: Secondary | ICD-10-CM | POA: Diagnosis not present

## 2022-11-03 DIAGNOSIS — Z8616 Personal history of COVID-19: Secondary | ICD-10-CM | POA: Insufficient documentation

## 2022-11-03 DIAGNOSIS — R051 Acute cough: Secondary | ICD-10-CM | POA: Diagnosis present

## 2022-11-03 DIAGNOSIS — R0602 Shortness of breath: Secondary | ICD-10-CM | POA: Diagnosis not present

## 2022-11-03 LAB — POC INFLUENZA A AND B ANTIGEN (URGENT CARE ONLY)
Influenza A Ag: NEGATIVE
Influenza B Ag: NEGATIVE

## 2022-11-03 MED ORDER — PROMETHAZINE-DM 6.25-15 MG/5ML PO SYRP: 5.0000 mL | ORAL_SOLUTION | Freq: Two times a day (BID) | ORAL | 0 refills | Status: DC | PRN

## 2022-11-03 MED ORDER — ALBUTEROL SULFATE HFA 108 (90 BASE) MCG/ACT IN AERS
INHALATION_SPRAY | RESPIRATORY_TRACT | Status: AC
  Filled 2022-11-03: qty 6.7

## 2022-11-03 MED ORDER — ALBUTEROL SULFATE HFA 108 (90 BASE) MCG/ACT IN AERS
2.0000 | INHALATION_SPRAY | Freq: Once | RESPIRATORY_TRACT | Status: AC
  Administered 2022-11-03: 2 via RESPIRATORY_TRACT

## 2022-11-03 NOTE — ED Provider Notes (Signed)
[place]    CSN: [id number] Arrival date & time: 11/03/22  1743      History   Chief Complaint Chief Complaint  Patient presents with   Cough    HPI Monique [name] is a 52 y.o. female.   Patient presents today with a 24-hour history of URI symptoms.  Reports body aches, cough, congestion, fatigue, malaise.  Denies any fever, chest pain, nausea, vomiting.  She has tried DayQuil and NyQuil without improvement of symptoms.  Denies any known sick contacts.  She does have a history of HIV but reports that she is compliant with her medication.  Lab work from 09/05/2022 showed she was undetectable.  She denies any recent steroids.  She was seen for blepharitis 10/12/2022 which when she was prescribed Augmentin.  Denies additional antibiotics in the past 90 days.  She has had COVID-19 vaccinations.  She has had COVID in the past with last episode several months ago.  She is eating and drinking normally.  She is a former smoker but quit several years ago.  She does report a history of asthma when she was much younger but has not required albuterol inhaler more recently.    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   Lower extremity edema 01/12/2015   Lung abscess ([place])    Morbid obesity ([place])    Pneumonia    TB (tuberculosis)    Tonsillitis    Vaccine counseling 06/06/2022   Viral URI 09/16/2017   Weight gain 10/27/2018    Patient Active Problem List   Diagnosis Date Noted   Vaccine counseling 06/06/2022   ASCUS with positive high risk HPV cervical 07/10/2021   Asthma 07/10/2021   Finger numbness 07/10/2021   Hyperlipidemia 07/06/2021   Screening for cervical cancer 04/24/2020   Encounter for screening  mammogram for malignant neoplasm of breast 04/24/2020   Morbid obesity ([place])    Seasonal allergies 01/26/2019   Healthcare maintenance 05/19/2018   Exertional chest pain 05/13/2017   Obese 08/10/2015   Essential hypertension 01/12/2015   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      Home Medications    Prior to Admission medications   Medication Sig Start Date End Date Taking? Authorizing Provider  promethazine-dextromethorphan (PROMETHAZINE-DM) 6.25-15 MG/5ML syrup Take 5 mLs by mouth 2 (two) times daily as needed for cough. 11/03/22  Yes Loy Mccartt K, PA-C  albuterol (VENTOLIN HFA) 108 (90 Base) MCG/ACT inhaler Inhale 1-2 puffs into the lungs every 6 (six) hours as needed for wheezing or shortness of breath. 07/12/22   Rodena Goldmann A, DO  amLODipine (NORVASC) 10 MG tablet TAKE 1 TABLET(10 MG) BY MOUTH DAILY 05/25/22   Tommy Medal, Lavell Islam, MD  aspirin EC 81 MG tablet Take 1 tablet (81 mg total) by mouth daily. Swallow whole. 07/06/21   Charleen Kirks,  Albertine Grates, MD  atorvastatin (LIPITOR) 20 MG tablet Take 1 tablet (20 mg total) by mouth daily. 07/06/21 07/06/22  Jose Persia, MD  baclofen (LIORESAL) 10 MG tablet Take 1 tablet (10 mg total) by mouth at bedtime as needed for muscle spasms. 08/24/22   Lamptey, Myrene Galas, MD  benzonatate (TESSALON) 100 MG capsule Take 1 capsule (100 mg total) by mouth every 8 (eight) hours. 01/23/22   Hans Eden, NP  cetirizine (ZYRTEC ALLERGY) 10 MG tablet Take 1 tablet (10 mg total) by mouth daily. 12/24/19 12/23/20  Bloomfield, Carley D,  DO  fluticasone (FLONASE) 50 MCG/ACT nasal spray Place 2 sprays into both nostrils daily. 07/12/22 08/11/22  Rodena Goldmann A, DO  hydrochlorothiazide (HYDRODIURIL) 25 MG tablet TAKE 1 TABLET(25 MG) BY MOUTH DAILY 07/05/22   Johny Blamer, DO  moxifloxacin (VIGAMOX) 0.5 % ophthalmic solution Place 1 drop into the right eye 3 (three) times daily. 10/02/22   Piontek, Junie Panning, MD  predniSONE (DELTASONE) 20 MG tablet Take 2 tablets (40 mg total) by mouth daily. Patient not taking: Reported on 11/03/2022 05/01/22   Vanessa Kick, MD  Study [phone] - bictegravir/emtrictabine/tenofovir alafenamide (BIKTARVY) 50-200-25 mg or placebo tablet ([facility]) Take 1 tablet by mouth daily. 09/04/22   Robert Bellow, PA-C  Study [phone] - [id number] 100/0.25 mg or placebo tablet ([facility]) Take 1 tablet by mouth daily. 09/04/22   Robert Bellow, PA-C    Family History Family History  Problem Relation Age of Onset   Hypertension Mother    Hypertension Father    Diabetes Sister    Hypertension Sister     Social History Social History   Tobacco Use   Smoking status: Former    Types: Cigarettes    Quit date: 12/22/2013    Years since quitting: 8.8   Smokeless tobacco: Never  Vaping Use   Vaping Use: Never used  Substance Use Topics   Alcohol use: No    Alcohol/week: 0.0 standard drinks of alcohol   Drug use: No     Allergies   Patient has no known allergies.   Review of Systems Review of Systems  Constitutional:  Positive for activity change and fatigue. Negative for appetite change and fever.  HENT:  Positive for congestion. Negative for sinus pressure, sneezing and sore throat.   Respiratory:  Positive for cough and shortness of breath.   Cardiovascular:  Negative for chest pain.  Gastrointestinal:  Negative for abdominal pain, diarrhea, nausea and vomiting.  Musculoskeletal:  Positive for arthralgias and myalgias.  Neurological:  Negative for dizziness, light-headedness and  headaches.     Physical Exam Triage Vital Signs ED Triage Vitals  Enc Vitals Group     BP 11/03/22 1835 138/80     Pulse Rate 11/03/22 1835 80     Resp 11/03/22 1835 20     Temp 11/03/22 1835 98.2 F (36.8 C)     Temp Source 11/03/22 1835 Oral     SpO2 11/03/22 1835 94 %     Weight --      Height --      Head Circumference --      Peak Flow --      Pain Score 11/03/22 1831 6     Pain Loc --      Pain Edu? --      Excl. in [place]? --    No data found.  Updated Vital Signs BP 138/80 (BP Location: Right Arm)   Pulse 80  Temp 98.2 F (36.8 C) (Oral)   Resp 20   LMP 10/16/2022 (Approximate)   SpO2 94%   Visual Acuity Right Eye Distance:   Left Eye Distance:   Bilateral Distance:    Right Eye Near:   Left Eye Near:    Bilateral Near:     Physical Exam Vitals reviewed.  Constitutional:      General: She is awake. She is not in acute distress.    Appearance: Normal appearance. She is well-developed. She is not ill-appearing.     Comments: Very pleasant female appears stated age in no acute distress sitting comfortably in exam room  HENT:     Head: Normocephalic and atraumatic.     Right Ear: Tympanic membrane, ear canal and external ear normal. Tympanic membrane is not erythematous or bulging.     Left Ear: Tympanic membrane, ear canal and external ear normal. Tympanic membrane is not erythematous or bulging.     Nose:     Right Sinus: No maxillary sinus tenderness or frontal sinus tenderness.     Left Sinus: No maxillary sinus tenderness or frontal sinus tenderness.     Mouth/Throat:     Pharynx: Uvula midline. No oropharyngeal exudate or posterior oropharyngeal erythema.  Cardiovascular:     Rate and Rhythm: Normal rate and regular rhythm.     Heart sounds: Normal heart sounds, S1 normal and S2 normal. No murmur heard. Pulmonary:     Effort: Pulmonary effort is normal.     Breath sounds: Wheezing present. No rhonchi or rales.  Psychiatric:        Behavior:  Behavior is cooperative.      UC Treatments / Results  Labs (all labs ordered are listed, but only abnormal results are displayed) Labs Reviewed  SARS CORONAVIRUS 2 (TAT 6-24 HRS)  POC INFLUENZA A AND B ANTIGEN (URGENT CARE ONLY)    EKG   Radiology DG Chest 2 View  Result Date: 11/03/2022 CLINICAL DATA:  Cough and shortness of breath EXAM: CHEST - 2 VIEW COMPARISON:  Chest radiograph 05/01/2022 FINDINGS: Stable cardiomegaly. Similar chronic scarring within the left upper hemithorax. No superimposed consolidation. No pleural effusion or pneumothorax. Osseous structures unremarkable. IMPRESSION: No acute cardiopulmonary process. Chronic scarring left upper hemithorax. Electronically Signed   By: Lovey Newcomer M.D.   On: 11/03/2022 19:09    Procedures Procedures (including critical care time)  Medications Ordered in UC Medications  albuterol (VENTOLIN HFA) 108 (90 Base) MCG/ACT inhaler 2 puff (2 puffs Inhalation Given 11/03/22 1929)    Initial Impression / Assessment and Plan / UC Course  I have reviewed the triage vital signs and the nursing notes.  Pertinent labs & imaging results that were available during my care of the patient were reviewed by me and considered in my medical decision making (see chart for details).     Patient is well-appearing, afebrile, nontoxic, nontachycardic.  She tested negative for influenza.  We tested for COVID and we will contact her if this result is positive within the next 24 hours.  Given her past medical history she is a candidate for antiviral therapy.  Consider Paxlovid; her last metabolic panel from [id number] showed normal creatinine of 0.67.  She would need to hold atorvastatin, amlodipine while on Paxlovid and for 3 days after completing course.  She was given Promethazine DM for cough.  Discussed this can be sedating and she is not to drive or drink alcohol while taking it.  She can use over-the-counter medications for  additional symptom relief.   Recommended that she rest and drink plenty of fluid.  She was then albuterol during clinic visit with improvement of wheezing and cough symptoms.  She was sent home with this medication with instruction to use this every 4-6 hours as needed.  Discussed that if she is not improving within a week she is to return for reevaluation.  If at any point she has worsening symptoms including worsening cough, shortness of breath, nausea/vomiting interfering with oral intake, fever not respond to medication, chest pain she needs to be seen immediately.  Strict return precautions given.  Work excuse note provided.  Final Clinical Impressions(s) / UC Diagnoses   Final diagnoses:  Upper respiratory tract infection, unspecified type  Acute cough     Discharge Instructions      You tested negative for flu.  We will contact you if you are positive for COVID.  Use your albuterol inhaler every 4-6 hours as needed for shortness of breath and coughing fits.  Take Promethazine DM for cough.  This will make you sleepy so do not drive drink alcohol with taking it.  Use over-the-counter medications as needed for additional symptom relief.  Rest and drink plenty of fluid.  If your symptoms do not improving within a week return for reevaluation.  If anything worsens you should be seen immediately.     ED Prescriptions     Medication Sig Dispense Auth. Provider   promethazine-dextromethorphan (PROMETHAZINE-DM) 6.25-15 MG/5ML syrup Take 5 mLs by mouth 2 (two) times daily as needed for cough. 118 mL Zhamir Pirro K, PA-C      PDMP not reviewed this encounter.   Terrilee Croak, PA-C 11/03/22 1953

## 2022-11-03 NOTE — ED Triage Notes (Signed)
Patient reports symptoms started last night.  Complaining of cough, aching, chills  Took nyquil, dayquil

## 2022-11-03 NOTE — Discharge Instructions (Signed)
You tested negative for flu.  We will contact you if you are positive for COVID.  Use your albuterol inhaler every 4-6 hours as needed for shortness of breath and coughing fits.  Take Promethazine DM for cough.  This will make you sleepy so do not drive drink alcohol with taking it.  Use over-the-counter medications as needed for additional symptom relief.  Rest and drink plenty of fluid.  If your symptoms do not improving within a week return for reevaluation.  If anything worsens you should be seen immediately.

## 2022-11-04 LAB — SARS CORONAVIRUS 2 (TAT 6-24 HRS): SARS Coronavirus 2: NEGATIVE

## 2022-11-10 ENCOUNTER — Encounter (HOSPITAL_COMMUNITY): Payer: Self-pay

## 2022-11-10 ENCOUNTER — Ambulatory Visit (HOSPITAL_COMMUNITY)
Admission: EM | Admit: 2022-11-10 | Discharge: 2022-11-10 | Disposition: A | Discharge status: Home / Self Care | Payer: BLUE CROSS/BLUE SHIELD | Attending: Emergency Medicine | Admitting: Emergency Medicine

## 2022-11-10 DIAGNOSIS — J069 Acute upper respiratory infection, unspecified: Secondary | ICD-10-CM

## 2022-11-10 MED ORDER — PREDNISONE 20 MG PO TABS: 40.0000 mg | ORAL_TABLET | Freq: Every day | ORAL | 0 refills | Status: DC

## 2022-11-10 MED ORDER — AMOXICILLIN 500 MG PO CAPS: 500.0000 mg | ORAL_CAPSULE | Freq: Two times a day (BID) | ORAL | 0 refills | Status: AC

## 2022-11-10 MED ORDER — NYSTATIN 100000 UNIT/ML MT SUSP: 5.0000 mL | Freq: Four times a day (QID) | OROMUCOSAL | 0 refills | Status: DC | PRN

## 2022-11-10 NOTE — ED Provider Notes (Signed)
[place]    CSN: [id number] Arrival date & time: 11/10/22  1439      History   Chief Complaint Chief Complaint  Patient presents with   Nasal Congestion   Sore Throat    HPI [name] is a 52 y.o. female.   Patient presents for evaluation of nasal congestion, rhinorrhea and a nonproductive cough present for 7 days, began to experience sore throat 3 to 4 days ago, pain with swallowing making it difficult to eat.  Denies fever, chills, body aches, shortness of breath or wheezing.  Was evaluated 1 week ago in this urgent care, negative for flu and COVID.  History of asthma, non-smoker.    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   Lower extremity edema 01/12/2015   Lung abscess ([place])    Morbid obesity ([place])    Pneumonia    TB (tuberculosis)    Tonsillitis    Vaccine counseling 06/06/2022   Viral URI 09/16/2017   Weight gain 10/27/2018    Patient Active Problem List   Diagnosis Date Noted   Vaccine counseling 06/06/2022   ASCUS with positive high risk HPV cervical 07/10/2021   Asthma 07/10/2021   Finger numbness 07/10/2021   Hyperlipidemia 07/06/2021   Screening for cervical cancer 04/24/2020   Encounter for screening mammogram for malignant neoplasm of breast 04/24/2020   Morbid obesity ([place])    Seasonal allergies 01/26/2019   Healthcare maintenance 05/19/2018   Exertional chest pain 05/13/2017   Obese 08/10/2015   Essential hypertension 01/12/2015   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      Home Medications    Prior to Admission medications   Medication Sig Start Date End Date Taking? Authorizing Provider  albuterol (VENTOLIN HFA) 108 (90 Base) MCG/ACT inhaler Inhale 1-2 puffs into the lungs every 6 (six) hours as needed for wheezing or shortness of breath. 07/12/22   Rodena Goldmann A, DO  amLODipine (NORVASC) 10 MG tablet TAKE 1 TABLET(10 MG) BY MOUTH DAILY 05/25/22   Tommy Medal, Lavell Islam, MD  aspirin EC 81 MG tablet Take 1 tablet (81 mg total) by mouth daily. Swallow whole. 07/06/21   Jose Persia, MD  atorvastatin (LIPITOR) 20 MG tablet Take 1 tablet (20 mg total) by mouth daily. 07/06/21 07/06/22  Jose Persia, MD  baclofen (LIORESAL) 10 MG tablet Take 1 tablet (10 mg total) by mouth at bedtime as needed for muscle spasms. 08/24/22   Lamptey, Myrene Galas, MD  benzonatate (TESSALON) 100 MG capsule Take 1 capsule (100 mg total) by mouth every 8 (eight) hours. 01/23/22   Hans Eden, NP  cetirizine (ZYRTEC ALLERGY) 10 MG tablet Take 1 tablet (10 mg total) by mouth daily. 12/24/19 12/23/20  Bloomfield, Carley D, DO  fluticasone (FLONASE) 50 MCG/ACT nasal spray Place 2 sprays into both nostrils  daily. 07/12/22 08/11/22  Rodena Goldmann A, DO  hydrochlorothiazide (HYDRODIURIL) 25 MG tablet TAKE 1 TABLET(25 MG) BY MOUTH DAILY 07/05/22   Johny Blamer, DO  moxifloxacin (VIGAMOX) 0.5 % ophthalmic solution Place 1 drop into the right eye 3 (three) times daily. 10/02/22   Piontek, Junie Panning, MD  predniSONE (DELTASONE) 20 MG tablet Take 2 tablets (40 mg total) by mouth daily. Patient not taking: Reported on 11/03/2022 05/01/22   Vanessa Kick, MD  promethazine-dextromethorphan (PROMETHAZINE-DM) 6.25-15 MG/5ML syrup Take 5 mLs by mouth 2 (two) times daily as  needed for cough. 11/03/22   Raspet, Derry Skill, PA-C  Study [id number] - bictegravir/emtrictabine/tenofovir alafenamide (BIKTARVY) 50-200-25 mg or placebo tablet ([facility]) Take 1 tablet by mouth daily. 09/04/22   Robert Bellow, PA-C  Study [phone] - [id number] 100/0.25 mg or placebo tablet ([facility]) Take 1 tablet by mouth daily. 09/04/22   Robert Bellow, PA-C    Family History Family History  Problem Relation Age of Onset   Hypertension Mother    Hypertension Father    Diabetes Sister    Hypertension Sister     Social History Social History   Tobacco Use   Smoking status: Former    Types: Cigarettes    Quit date: 12/22/2013    Years since quitting: 8.8   Smokeless tobacco: Never  Vaping Use   Vaping Use: Never used  Substance Use Topics   Alcohol use: No    Alcohol/week: 0.0 standard drinks of alcohol   Drug use: No     Allergies   Patient has no known allergies.   Review of Systems Review of Systems  Constitutional: Negative.   HENT:  Positive for congestion, rhinorrhea and sore throat. Negative for dental problem, drooling, ear discharge, ear pain, facial swelling, hearing loss, mouth sores, nosebleeds, postnasal drip, sinus pressure, sinus pain, sneezing, tinnitus, trouble swallowing and voice change.   Respiratory:  Positive for cough. Negative for apnea, choking, chest tightness, shortness of breath, wheezing and stridor.   Cardiovascular: Negative.   Gastrointestinal: Negative.   Skin: Negative.      Physical Exam Triage Vital Signs ED Triage Vitals  Enc Vitals Group     BP 11/10/22 1510 (!) 173/116     Pulse Rate 11/10/22 1510 88     Resp 11/10/22 1510 18     Temp 11/10/22 1510 98.3 F (36.8 C)     Temp Source 11/10/22 1510 Oral     SpO2 11/10/22 1510 98 %     Weight --      Height --      Head Circumference --      Peak Flow --      Pain Score 11/10/22 1514 5     Pain Loc --      Pain Edu? --      Excl. in [place]? --    No data  found.  Updated Vital Signs BP (!) 173/116 (BP Location: Left Arm)   Pulse 88   Temp 98.3 F (36.8 C) (Oral)   Resp 18   LMP 10/16/2022 (Approximate)   SpO2 98%   Visual Acuity Right Eye Distance:   Left Eye Distance:   Bilateral Distance:    Right Eye Near:   Left Eye Near:    Bilateral Near:     Physical Exam Constitutional:      Appearance: She is well-developed.  HENT:     Head: Normocephalic.     Right Ear: Tympanic membrane and  ear canal normal.     Left Ear: Tympanic membrane and ear canal normal.     Nose: Congestion and rhinorrhea present.     Mouth/Throat:     Mouth: Mucous membranes are moist.     Pharynx: No posterior oropharyngeal erythema.     Tonsils: No tonsillar exudate. 3+ on the right. 3+ on the left.  Cardiovascular:     Rate and Rhythm: Normal rate and regular rhythm.  Pulmonary:     Effort: Pulmonary effort is normal.     Breath sounds: Normal breath sounds.  Skin:    General: Skin is warm and dry.  Neurological:     General: No focal deficit present.     Mental Status: She is alert and oriented to person, place, and time.      UC Treatments / Results  Labs (all labs ordered are listed, but only abnormal results are displayed) Labs Reviewed - No data to display  EKG   Radiology No results found.  Procedures Procedures (including critical care time)  Medications Ordered in UC Medications - No data to display  Initial Impression / Assessment and Plan / UC Course  I have reviewed the triage vital signs and the nursing notes.  Pertinent labs & imaging results that were available during my care of the patient were reviewed by me and considered in my medical decision making (see chart for details).  Acute upper respiratory infection  Patient is in no signs of distress nor toxic appearing.  Vital signs are stable.  Low suspicion for pneumonia, pneumothorax or bronchitis and therefore will defer imaging.  Providing prophylactic  bacterial coverage, amoxicillin prescribed.  Due to enlargement of the tonsils prescribed prednisone which will also help to settle cough, may continue use of Promethazine DM and prescribed Magic mouthwash for additional use . May use additional over-the-counter medications as needed for supportive care.  May follow-up with urgent care as needed if symptoms persist or worsen.   Final Clinical Impressions(s) / UC Diagnoses   Final diagnoses:  None   Discharge Instructions   None    ED Prescriptions   None    PDMP not reviewed this encounter.   Hans Eden, NP 11/10/22 [phone]

## 2022-11-10 NOTE — ED Triage Notes (Signed)
Here for cough and congestion x 1 week. Pt reports her throat is hurting. She is taken Nyquil and mucinex.

## 2022-11-10 NOTE — Discharge Instructions (Signed)
Use of amoxicillin every morning and every evening for 7 days to cover for bacteria, should take approximately 48 hours before medicine gets in your system and should be steady improvement from there  Starting tomorrow take prednisone every morning with food for 5 days to reduce inflammation which should help with your throat pain as it is being caused by the enlargement of your tonsils as well as this settle your coughing  You may gargle and spit Magic mouthwash solution every 4-6 hours as needed for comfort    You can take Tylenol and/or Ibuprofen as needed for fever reduction and pain relief.   For cough: honey 1/2 to 1 teaspoon (you can dilute the honey in water or another fluid).  You can also use guaifenesin and dextromethorphan for cough. You can use a humidifier for chest congestion and cough.  If you don't have a humidifier, you can sit in the bathroom with the hot shower running.      For sore throat: try warm salt water gargles, cepacol lozenges, throat spray, warm tea or water with lemon/honey, popsicles or ice, or OTC cold relief medicine for throat discomfort.   For congestion: take a daily anti-histamine like Zyrtec, Claritin, and a oral decongestant, such as pseudoephedrine.  You can also use Flonase 1-2 sprays in each nostril daily.   It is important to stay hydrated: drink plenty of fluids (water, gatorade/powerade/pedialyte, juices, or teas) to keep your throat moisturized and help further relieve irritation/discomfort.

## 2022-11-28 ENCOUNTER — Encounter (INDEPENDENT_AMBULATORY_CARE_PROVIDER_SITE_OTHER): Payer: Self-pay | Admitting: *Deleted

## 2022-11-28 ENCOUNTER — Other Ambulatory Visit: Payer: Self-pay

## 2022-11-28 DIAGNOSIS — Z006 Encounter for examination for normal comparison and control in clinical research program: Secondary | ICD-10-CM

## 2022-11-28 MED ORDER — STUDY - MK-8591A-052 - MK-8591A 100/0.25 MG OR PLACEBO TABLET (PI-VAN DAM): 1.0000 | ORAL_TABLET | Freq: Every day | ORAL | 0 refills | Status: DC

## 2022-11-28 MED ORDER — STUDY - MK-8591A-052 - BICTEGRAVIR/EMTRICITABINE/TENOFOVIR ALAFENAMIDE (BIKTARVY) 50-200-25 MG OR PLACEBO TABLET (PI-VAN DAM): 1.0000 | ORAL_TABLET | Freq: Every day | ORAL | 0 refills | Status: DC

## 2022-11-28 NOTE — Research (Signed)
Monique [name] seen today for her week 24 visit for [employer] study [id number]. Seen and treated at [facility] In February and March for Blepharitis and URI respectively.  Symptoms have since resolved. States that her son is currently in the ICU. States that he was extubated this morning. She has been staying at the hospital for the past 5 days. She did get home last night as was able to get some sleep. She has excellent support system through family and friends. She will return in June for her next research visit.

## 2022-11-30 ENCOUNTER — Encounter (HOSPITAL_COMMUNITY): Payer: Self-pay

## 2022-11-30 ENCOUNTER — Ambulatory Visit (INDEPENDENT_AMBULATORY_CARE_PROVIDER_SITE_OTHER): Payer: BLUE CROSS/BLUE SHIELD

## 2022-11-30 ENCOUNTER — Ambulatory Visit (HOSPITAL_COMMUNITY)
Admission: EM | Admit: 2022-11-30 | Discharge: 2022-11-30 | Disposition: A | Discharge status: Home / Self Care | Payer: BLUE CROSS/BLUE SHIELD | Attending: Urgent Care | Admitting: Urgent Care

## 2022-11-30 DIAGNOSIS — M542 Cervicalgia: Secondary | ICD-10-CM | POA: Diagnosis not present

## 2022-11-30 DIAGNOSIS — S161XXA Strain of muscle, fascia and tendon at neck level, initial encounter: Secondary | ICD-10-CM | POA: Diagnosis not present

## 2022-11-30 DIAGNOSIS — M5412 Radiculopathy, cervical region: Secondary | ICD-10-CM

## 2022-11-30 MED ORDER — CYCLOBENZAPRINE HCL 5 MG PO TABS: 5.0000 mg | ORAL_TABLET | Freq: Three times a day (TID) | ORAL | 0 refills | Status: DC | PRN

## 2022-11-30 MED ORDER — PREDNISONE 50 MG PO TABS: 50.0000 mg | ORAL_TABLET | Freq: Every day | ORAL | 0 refills | Status: DC

## 2022-11-30 NOTE — ED Provider Notes (Addendum)
[facility]   MRN: [id number] DOB: Apr 19, 1971  Subjective:   Monique [name] is a 52 y.o. female presenting for 2 day history of acute onset moderate to severe constant right sided neck pain that radiates to the right shoulder, trapezius and right upper arm.  No fall, trauma.  Patient reports that she can rotate the head but has significant shooting pains.  Denies history of musculoskeletal disorders.  Has not gotten any relief from topical therapies.  Has essential hypertension, reports she forgot to take her medication due to her pain.  No current facility-administered medications for this encounter.  Current Outpatient Medications:    albuterol (VENTOLIN HFA) 108 (90 Base) MCG/ACT inhaler, Inhale 1-2 puffs into the lungs every 6 (six) hours as needed for wheezing or shortness of breath., Disp: 18 g, Rfl: 1   amLODipine (NORVASC) 10 MG tablet, TAKE 1 TABLET(10 MG) BY MOUTH DAILY, Disp: 30 tablet, Rfl: 11   aspirin EC 81 MG tablet, Take 1 tablet (81 mg total) by mouth daily. Swallow whole., Disp: 30 tablet, Rfl: 2   atorvastatin (LIPITOR) 20 MG tablet, Take 1 tablet (20 mg total) by mouth daily., Disp: 30 tablet, Rfl: 2   baclofen (LIORESAL) 10 MG tablet, Take 1 tablet (10 mg total) by mouth at bedtime as needed for muscle spasms., Disp: 10 each, Rfl: 0   benzonatate (TESSALON) 100 MG capsule, Take 1 capsule (100 mg total) by mouth every 8 (eight) hours., Disp: 21 capsule, Rfl: 0   cetirizine (ZYRTEC ALLERGY) 10 MG tablet, Take 1 tablet (10 mg total) by mouth daily., Disp: 30 tablet, Rfl: 2   fluticasone (FLONASE) 50 MCG/ACT nasal spray, Place 2 sprays into both nostrils daily., Disp: 1 g, Rfl: 0   hydrochlorothiazide (HYDRODIURIL) 25 MG tablet, TAKE 1 TABLET(25 MG) BY MOUTH DAILY, Disp: 30 tablet, Rfl: 11   magic mouthwash (nystatin, lidocaine, diphenhydrAMINE, alum & mag hydroxide) suspension, Swish and spit 5 mLs 4 (four) times daily as needed for mouth pain., Disp:  180 mL, Rfl: 0   Study - [id number] - bictegravir/emtrictabine/tenofovir alafenamide (BIKTARVY) 50-200-25 mg or placebo tablet (PI-[place]), Take 1 tablet by mouth daily., Disp: 105 tablet, Rfl: 0   Study - [id number] - MK-[id number] 100/0.25 mg or placebo tablet (PI-[place]), Take 1 tablet by mouth daily., Disp: 105 tablet, Rfl: 0   No Known Allergies  Past Medical History:  Diagnosis Date   COVID-19 virus infection 11/23/2019   Exertional chest pain 05/13/2017   Fatigue 03/07/2021   Groin pain 03/07/2021   HIV (human immunodeficiency virus infection)    Hypertension    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   Lower extremity edema 01/12/2015   Lung abscess    Morbid obesity    Pneumonia    TB (tuberculosis)    Tonsillitis    Vaccine counseling 06/06/2022   Viral URI 09/16/2017   Weight gain 10/27/2018     Past Surgical History:  Procedure Laterality Date   CESAREAN SECTION     VIDEO BRONCHOSCOPY Bilateral 04/13/2013   Procedure: VIDEO BRONCHOSCOPY WITHOUT FLUORO;  Surgeon: Kalman ShanMurali Ramaswamy, MD;  [facility];  Service: Cardiopulmonary;  Laterality: Bilateral;    Family History  Problem Relation Age of Onset   Hypertension Mother    Hypertension Father    Diabetes Sister    Hypertension Sister     Social History   Tobacco Use   Smoking status: Former    Types: Cigarettes  Quit date: 12/22/2013    Years since quitting: 8.9   Smokeless tobacco: Never  Vaping Use   Vaping Use: Never used  Substance Use Topics   Alcohol use: No    Alcohol/week: 0.0 standard drinks of alcohol   Drug use: No    ROS   Objective:   Vitals: BP (!) 163/103 (BP Location: Right Arm)   Pulse 91   Temp 98.4 F (36.9 C) (Oral)   Resp 16   LMP 11/25/2022 (Approximate)   SpO2 94%   BP recheck was 144/95 by PA Rasheem Figiel.   Physical Exam Constitutional:      General: She is not in acute distress.    Appearance: Normal appearance. She is  well-developed. She is not ill-appearing, toxic-appearing or diaphoretic.  HENT:     Head: Normocephalic and atraumatic.     Right Ear: External ear normal.     Left Ear: External ear normal.     Nose: Nose normal.     Mouth/Throat:     Mouth: Mucous membranes are moist.  Eyes:     General: No scleral icterus.       Right eye: No discharge.        Left eye: No discharge.     Extraocular Movements: Extraocular movements intact.  Cardiovascular:     Rate and Rhythm: Normal rate.  Pulmonary:     Effort: Pulmonary effort is normal.  Musculoskeletal:     Cervical back: Neck supple. Spasms and tenderness (+ Spurling maneuver to the right, negative Lhermitte sign) present. No swelling, edema, deformity, erythema, signs of trauma, lacerations, rigidity, torticollis, bony tenderness or crepitus. Pain with movement present. No spinous process tenderness or muscular tenderness. Decreased range of motion.  Skin:    General: Skin is warm and dry.  Neurological:     General: No focal deficit present.     Mental Status: She is alert and oriented to person, place, and time.     Cranial Nerves: No cranial nerve deficit.     Motor: No weakness.     Coordination: Coordination normal.     Gait: Gait normal.     Deep Tendon Reflexes: Reflexes normal.  Psychiatric:        Mood and Affect: Mood normal.        Behavior: Behavior normal.    DG Cervical Spine Complete  Result Date: 11/30/2022 CLINICAL DATA:  Neck pain radiating to the right shoulder since yesterday. EXAM: CERVICAL SPINE - COMPLETE 4+ VIEW COMPARISON:  None Available. FINDINGS: The lateral view is diagnostic to the C6-C7 level. There is no acute fracture or subluxation. Vertebral body heights are preserved. Alignment is normal. Scattered mild anterior endplate spurring. Interveterbral disc spaces are maintained. Normal prevertebral soft tissues. IMPRESSION: 1. No acute osseous abnormality.  Minimal degenerative changes. Electronically Signed    By: Obie Dredge M.D.   On: 11/30/2022 09:46     Assessment and Plan :   PDMP not reviewed this encounter.  1. Cervical radiculopathy   2. Cervical strain, acute, initial encounter     Given severity of her pain and elements of cervical radiculopathy, positive Spurling maneuver recommended oral prednisone course.  Can also supplement this with a muscle relaxant.  Follow-up with the spine clinic if symptoms persist.  Counseled patient on potential for adverse effects with medications prescribed/recommended today, ER and return-to-clinic precautions discussed, patient verbalized understanding.     Wallis Bamberg, [place] 11/30/22 1116

## 2022-11-30 NOTE — ED Triage Notes (Signed)
Pt states upper back pain that radiates to her right shoulder since yesterday.  Pt states she put muscle cream on it at home with no relief.

## 2023-01-08 ENCOUNTER — Ambulatory Visit (HOSPITAL_COMMUNITY)
Admission: EM | Admit: 2023-01-08 | Discharge: 2023-01-08 | Disposition: A | Discharge status: Home / Self Care | Payer: BLUE CROSS/BLUE SHIELD | Attending: Family Medicine | Admitting: Family Medicine

## 2023-01-08 ENCOUNTER — Encounter (HOSPITAL_COMMUNITY): Payer: Self-pay | Admitting: *Deleted

## 2023-01-08 DIAGNOSIS — S76312A Strain of muscle, fascia and tendon of the posterior muscle group at thigh level, left thigh, initial encounter: Secondary | ICD-10-CM

## 2023-01-08 MED ORDER — METHOCARBAMOL 500 MG PO TABS: 1000.0000 mg | ORAL_TABLET | Freq: Two times a day (BID) | ORAL | 0 refills | Status: DC | PRN

## 2023-01-08 MED ORDER — KETOROLAC TROMETHAMINE 30 MG/ML IJ SOLN
30.0000 mg | Freq: Once | INTRAMUSCULAR | Status: AC
  Administered 2023-01-08: 30 mg via INTRAMUSCULAR

## 2023-01-08 MED ORDER — KETOROLAC TROMETHAMINE 30 MG/ML IJ SOLN
INTRAMUSCULAR | Status: AC
  Filled 2023-01-08: qty 1

## 2023-01-08 NOTE — Discharge Instructions (Addendum)
Continue taking Tylenol for the next several days. Do not sit for long periods of time if you can help it.  Meds ordered this encounter  Medications   ketorolac (TORADOL) 30 MG/ML injection 30 mg   methocarbamol (ROBAXIN) 500 MG tablet    Sig: Take 2 tablets (1,000 mg total) by mouth 2 (two) times daily as needed for muscle spasms.    Dispense:  20 tablet    Refill:  0
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
## 2023-02-26 ENCOUNTER — Encounter (INDEPENDENT_AMBULATORY_CARE_PROVIDER_SITE_OTHER): Payer: Self-pay

## 2023-02-26 ENCOUNTER — Other Ambulatory Visit: Payer: Self-pay

## 2023-02-26 DIAGNOSIS — Z006 Encounter for examination for normal comparison and control in clinical research program: Secondary | ICD-10-CM

## 2023-02-26 MED ORDER — STUDY - MK-8591A-052 - MK-8591A 100/0.25 MG OR PLACEBO TABLET (PI-VAN DAM): 1.0000 | ORAL_TABLET | Freq: Every day | ORAL | 0 refills | Status: DC

## 2023-02-26 MED ORDER — STUDY - MK-8591A-052 - BICTEGRAVIR/EMTRICITABINE/TENOFOVIR ALAFENAMIDE (BIKTARVY) 50-200-25 MG OR PLACEBO TABLET (PI-VAN DAM): 1.0000 | ORAL_TABLET | Freq: Every day | ORAL | 0 refills | Status: DC

## 2023-02-26 NOTE — Research (Signed)
Individual seen for Week 36 visit for [employer] 1610R-604, A Phase 3, Randomized, Active-Controlled, Double-Blind Clinical Study to Evaluate a Switch to Doravirine/Islatravir (DOR/ISL 100 mg/0.25 mg) Once-Daily in Participants With HIV-1 Who Are Virologically Suppressed on Bictegravir/Emtricitabine/Tenofovir Alafenamide (BIC/FTC/TAF). Pt. With elevated BP, seen during study visit by PA on study. Encouraged to follow up with PCP regarding HTN. Has not filled BP medication recently, states she will pick up medication today.  Reviewed risk factors and emergency signs to look out for and when to go to ER. Asymptomatic today. All procedures carried out per protocol. Study medications dispensed. Plan to see participant again in September for week 48 Visit.

## 2023-03-22 ENCOUNTER — Ambulatory Visit (HOSPITAL_COMMUNITY)
Admission: EM | Admit: 2023-03-22 | Discharge: 2023-03-22 | Disposition: A | Discharge status: Home / Self Care | Payer: BLUE CROSS/BLUE SHIELD | Source: Home / Self Care

## 2023-03-22 ENCOUNTER — Encounter (HOSPITAL_COMMUNITY): Payer: Self-pay

## 2023-03-22 DIAGNOSIS — G44209 Tension-type headache, unspecified, not intractable: Secondary | ICD-10-CM

## 2023-03-22 DIAGNOSIS — I10 Essential (primary) hypertension: Secondary | ICD-10-CM

## 2023-03-22 MED ORDER — ACETAMINOPHEN 325 MG PO TABS
650.0000 mg | ORAL_TABLET | Freq: Once | ORAL | Status: AC
  Administered 2023-03-22: 650 mg via ORAL

## 2023-03-22 MED ORDER — ACETAMINOPHEN 325 MG PO TABS
ORAL_TABLET | ORAL | Status: AC
  Filled 2023-03-22: qty 2

## 2023-03-22 NOTE — ED Provider Notes (Signed)
[facility]    CSN: [id number] Arrival date & time: 03/22/23  1213      History   Chief Complaint Chief Complaint  Patient presents with   Headache    HPI Monique [name] is a 52 y.o. female.   Patient presents today with a 15-hour history of headache.  She reports that pain is rated 8 on a 0-10 pain scale, described as a frontal pressure, no alleviating factors identified.  She does have a history of headaches often related to elevated blood pressure and current symptoms result of previous episodes of this condition.  She denies history of migraine.  She denies any visual disturbance, nausea, vomiting, photophobia, facial asymmetry, focal weakness, dysarthria.  She has not tried any over-the-counter medication for symptom management.  She denies any known illness including cough or congestion.  Denies any recent head injury or medication changes.  This is not the worst headache of her life.  She has had improvement since she has been resting and called out of work today.  She is requesting a work excuse note.  She reports that yesterday she ate fried chicken and wonders if this could have elevated her blood pressure and caused her symptoms.  She denies any NSAID use, decongestants, increasing caffeine.  Denies additional increase in sodium consumption.  She has been compliant with her antihypertensive medications.    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   Lower extremity edema 01/12/2015   Lung abscess (HCC)    Morbid obesity (HCC)    Pneumonia    TB (tuberculosis)    Tonsillitis    Vaccine counseling 06/06/2022   Viral URI 09/16/2017   Weight gain 10/27/2018    Patient Active Problem List   Diagnosis  Date Noted   Vaccine counseling 06/06/2022   ASCUS with positive high risk HPV cervical 07/10/2021   Asthma 07/10/2021   Finger numbness 07/10/2021   Hyperlipidemia 07/06/2021   Screening for cervical cancer 04/24/2020   Encounter for screening mammogram for malignant neoplasm of breast 04/24/2020   Morbid obesity (HCC)    Seasonal allergies 01/26/2019   Healthcare maintenance 05/19/2018   Exertional chest pain 05/13/2017   Obese 08/10/2015   Essential hypertension 01/12/2015   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      Home Medications    Prior to Admission medications   Medication Sig Start Date End Date Taking? Authorizing Provider  albuterol (VENTOLIN HFA) 108 (90 Base) MCG/ACT inhaler Inhale 1-2 puffs into the lungs every 6 (six) hours as needed for wheezing or shortness of breath. 07/12/22  Yes Gillermo Murdoch A, DO  amLODipine (NORVASC) 10 MG tablet TAKE 1 TABLET(10 MG) BY MOUTH DAILY 05/25/22  Yes Daiva Eves, Lisette Grinder, MD  aspirin EC 81 MG tablet Take 1 tablet (81 mg total) by mouth daily. Swallow  whole. 07/06/21  Yes Verdene Lennert, MD  cetirizine (ZYRTEC ALLERGY) 10 MG tablet Take 1 tablet (10 mg total) by mouth daily. 12/24/19 03/22/23 Yes Bloomfield, Carley D, DO  fluticasone (FLONASE) 50 MCG/ACT nasal spray Place 2 sprays into both nostrils daily. 07/12/22 03/22/23 Yes Tedrowe, Michelle A, DO  hydrochlorothiazide (HYDRODIURIL) 25 MG tablet TAKE 1 TABLET(25 MG) BY MOUTH DAILY 07/05/22  Yes Rocky Morel, DO  Study - [id number] -  bictegravir/emtrictabine/tenofovir alafenamide (BIKTARVY) 50-200-25 mg or placebo tablet (PI-[place]) Take 1 tablet by mouth daily. 02/26/23  Yes Daiva Eves, Lisette Grinder, MD  Study [phone] - [id number] 100/0.25 mg or placebo tablet (PI-[place]) Take 1 tablet by mouth daily. 02/26/23  Yes Daiva Eves, Lisette Grinder, MD  atorvastatin (LIPITOR) 20 MG tablet Take 1 tablet (20 mg total) by mouth daily. Patient not taking: Reported on 03/22/2023 07/06/21 07/06/22  Verdene Lennert, MD    Family History Family History  Problem Relation Age of Onset   Hypertension Mother    Hypertension Father    Diabetes Sister    Hypertension Sister     Social History Social History   Tobacco Use   Smoking status: Former    Current packs/day: 0.00    Types: Cigarettes    Quit date: 12/22/2013    Years since quitting: 9.2   Smokeless tobacco: Never  Vaping Use   Vaping status: Never Used  Substance Use Topics   Alcohol use: No    Alcohol/week: 0.0 standard drinks of alcohol   Drug use: No     Allergies   Patient has no known allergies.   Review of Systems Review of Systems  Constitutional:  Positive for activity change. Negative for appetite change, fatigue and fever.  Eyes:  Negative for photophobia and visual disturbance.  Respiratory:  Negative for cough.   Cardiovascular:  Negative for chest pain, palpitations and leg swelling.  Gastrointestinal:  Negative for abdominal pain, diarrhea, nausea and vomiting.  Neurological:  Positive for headaches. Negative for dizziness, seizures, syncope, facial asymmetry, speech difficulty, weakness and light-headedness.     Physical Exam Triage Vital Signs ED Triage Vitals [03/22/23 1412]  Encounter Vitals Group     BP (!) 162/110     Systolic BP Percentile      Diastolic BP Percentile      Pulse Rate 79     Resp 16     Temp 98.4 F (36.9 C)     Temp Source Oral     SpO2 98 %     Weight 180 lb (81.6 kg)     Height 5\' 1"  (1.549 m)     Head  Circumference      Peak Flow      Pain Score 8     Pain Loc      Pain Education      Exclude from Growth Chart    No data found.  Updated Vital Signs BP (!) 162/106 (BP Location: Right Arm)   Pulse 79   Temp 98.4 F (36.9 C) (Oral)   Resp 16   Ht 5\' 1"  (1.549 m)   Wt 180 lb (81.6 kg)   LMP 02/23/2023 (Approximate)   SpO2 98%   BMI 34.01 kg/m   Visual Acuity Right Eye Distance:   Left Eye Distance:   Bilateral Distance:    Right Eye Near:   Left Eye Near:    Bilateral Near:     Physical Exam Vitals reviewed.  Constitutional:  General: She is awake. She is not in acute distress.    Appearance: Normal appearance. She is well-developed. She is not ill-appearing.     Comments: Very pleasant female appears stated age in no acute distress sitting comfortably in exam room  HENT:     Head: Normocephalic and atraumatic. No raccoon eyes, Battle's sign or contusion.     Right Ear: Tympanic membrane, ear canal and external ear normal. No hemotympanum.     Left Ear: Tympanic membrane, ear canal and external ear normal. No hemotympanum.     Nose: Nose normal.     Mouth/Throat:     Tongue: Tongue does not deviate from midline.     Pharynx: Uvula midline. No oropharyngeal exudate or posterior oropharyngeal erythema.  Eyes:     Extraocular Movements: Extraocular movements intact.     Pupils: Pupils are equal, round, and reactive to light.  Cardiovascular:     Rate and Rhythm: Normal rate and regular rhythm.     Heart sounds: Normal heart sounds, S1 normal and S2 normal. No murmur heard. Pulmonary:     Effort: Pulmonary effort is normal.     Breath sounds: Normal breath sounds. No wheezing, rhonchi or rales.     Comments: Clear to auscultation bilaterally Musculoskeletal:     Cervical back: Normal range of motion and neck supple.     Comments: Strength 5/5 bilateral upper and lower extremities.  Neurological:     General: No focal deficit present.     Mental Status: She  is alert and oriented to person, place, and time.     Cranial Nerves: Cranial nerves 2-12 are intact.     Motor: Motor function is intact.     Coordination: Coordination is intact.     Gait: Gait is intact.     Comments: Cranial nerves II through XII grossly intact.  Psychiatric:        Behavior: Behavior is cooperative.      UC Treatments / Results  Labs (all labs ordered are listed, but only abnormal results are displayed) Labs Reviewed - No data to display  EKG   Radiology No results found.  Procedures Procedures (including critical care time)  Medications Ordered in UC Medications  acetaminophen (TYLENOL) tablet 650 mg (650 mg Oral Given 03/22/23 1446)    Initial Impression / Assessment and Plan / UC Course  I have reviewed the triage vital signs and the nursing notes.  Pertinent labs & imaging results that were available during my care of the patient were reviewed by me and considered in my medical decision making (see chart for details).     Patient is hypertensive but her physical exam is reassuring and she has had improvement of symptoms.  We did discuss the potential utility of going to the emergency room given her headache in the setting of high blood pressure to rule out more concerning etiology but patient declined this as she has had episodes of similar symptoms in the past and is already having some improvement of symptoms.  She was given Tylenol in clinic and monitored.  Her headache resolved but her blood pressure remained elevated.  She attributes this to eating an increased amount of salt yesterday.  She again declined go to the emergency room and will instead monitor her blood pressure at home.  We discussed that if her blood pressure remains above 140/90 and she has a recurrent headache she should be seen in the emergency room.  We also discussed that if  she develops any additional symptoms in the setting of high blood pressure including chest pain, shortness  of breath, headache, vision change, dizziness she needs to go to the ER.  Recommend close follow-up with her primary care.  She is to avoid salt, decongestants, caffeine, sodium.  She can use Tylenol for pain.  Recommend that she rest and drink plenty of fluid.  Discussed at length alarm symptoms that warrant going to the emergency room to which she expressed understanding.  Strict return precautions given.  Work excuse note provided.  Final Clinical Impressions(s) / UC Diagnoses   Final diagnoses:  Tension headache  Elevated blood pressure reading with diagnosis of hypertension     Discharge Instructions      I am glad that your headache is improving.  Continue taking Tylenol as needed.  Monitor your blood pressure at home.  If this is persistently above 140/90 and your headache returns or if you develop any additional symptoms such as leg swelling, heart racing, chest pain, shortness of breath, vision change, dizziness you need to go to the emergency room immediately as we discussed.  Try to avoid salt, decongestants, NSAIDs (aspirin, ibuprofen/Advil, naproxen/Aleve).  Take your blood pressure medication as prescribed.  Follow-up with your primary care first thing next week assuming that your symptoms do not occur and your blood pressure improves.     ED Prescriptions   None    PDMP not reviewed this encounter.   Jeani Hawking, PA-C 03/22/23 1525

## 2023-03-22 NOTE — ED Triage Notes (Signed)
Patient here today with c/o headache since last night. She also has some lightheadedness when she goes form sitting to standing.

## 2023-03-22 NOTE — Discharge Instructions (Addendum)
I am glad that your headache is improving.  Continue taking Tylenol as needed.  Monitor your blood pressure at home.  If this is persistently above 140/90 and your headache returns or if you develop any additional symptoms such as leg swelling, heart racing, chest pain, shortness of breath, vision change, dizziness you need to go to the emergency room immediately as we discussed.  Try to avoid salt, decongestants, NSAIDs (aspirin, ibuprofen/Advil, naproxen/Aleve).  Take your blood pressure medication as prescribed.  Follow-up with your primary care first thing next week assuming that your symptoms do not occur and your blood pressure improves.

## 2023-05-03 ENCOUNTER — Encounter (INDEPENDENT_AMBULATORY_CARE_PROVIDER_SITE_OTHER): Payer: Self-pay

## 2023-05-03 ENCOUNTER — Other Ambulatory Visit: Payer: Self-pay

## 2023-05-03 VITALS — BP 144/91 | HR 80 | Temp 97.9°F | Resp 16 | Wt 253.3 lb

## 2023-05-03 DIAGNOSIS — I10 Essential (primary) hypertension: Secondary | ICD-10-CM

## 2023-05-03 DIAGNOSIS — Z006 Encounter for examination for normal comparison and control in clinical research program: Secondary | ICD-10-CM

## 2023-05-03 MED ORDER — HYDROCHLOROTHIAZIDE 25 MG PO TABS: 25.0000 mg | ORAL_TABLET | Freq: Every day | ORAL | 0 refills | Status: DC

## 2023-05-03 MED ORDER — STUDY - MK-8591A-052 - BICTEGRAVIR/EMTRICITABINE/TENOFOVIR ALAFENAMIDE (BIKTARVY) 50-200-25 MG OR PLACEBO TABLET (PI-VAN DAM): 1.0000 | ORAL_TABLET | Freq: Every day | ORAL | 0 refills | Status: DC

## 2023-05-03 MED ORDER — STUDY - MK-8591A-052 - MK-8591A 100/0.25 MG OR PLACEBO TABLET (PI-VAN DAM): 1.0000 | ORAL_TABLET | Freq: Every day | ORAL | 0 refills | Status: DC

## 2023-05-03 NOTE — Research (Signed)
Individual seen for research visit Week 48 for [id number], A Phase 3, Randomized, Active-Controlled, Double-Blind Clinical Study to Evaluate a Switch to Doravirine/Islatravir (DOR/ISL 100 mg/0.25 mg) Once-Daily in Participants With HIV-1 Who Are Virologically Suppressed on Bictegravir/Emtricitabine/Tenofovir Alafenamide (BIC/FTC/TAF).   Overall individual is doing well. BP was slightly elevated, asymptomatic, has appt to see her PCP later this month and will follow up regarding Hypertension. All procedures carried out per protocol. Study medications dispensed. Plan to see participant again in 12 weeks.

## 2023-05-20 ENCOUNTER — Ambulatory Visit: Payer: BLUE CROSS/BLUE SHIELD | Admitting: Infectious Disease

## 2023-05-21 ENCOUNTER — Encounter: Payer: BLUE CROSS/BLUE SHIELD | Admitting: Student

## 2023-05-21 NOTE — Progress Notes (Deleted)
52 year old female with HTN, HIV, asthma, ASCUS 2021 07/06/2021 05/03/2023 labs-HIV RNA negative, CD4 count 800

## 2023-06-03 ENCOUNTER — Other Ambulatory Visit: Payer: Self-pay | Admitting: Infectious Disease

## 2023-06-03 DIAGNOSIS — I10 Essential (primary) hypertension: Secondary | ICD-10-CM

## 2023-07-03 ENCOUNTER — Ambulatory Visit (HOSPITAL_COMMUNITY)
Admission: EM | Admit: 2023-07-03 | Discharge: 2023-07-03 | Disposition: A | Discharge status: Home / Self Care | Payer: BLUE CROSS/BLUE SHIELD | Attending: Family Medicine | Admitting: Family Medicine

## 2023-07-03 ENCOUNTER — Encounter (HOSPITAL_COMMUNITY): Payer: Self-pay | Admitting: Emergency Medicine

## 2023-07-03 DIAGNOSIS — I10 Essential (primary) hypertension: Secondary | ICD-10-CM | POA: Diagnosis not present

## 2023-07-03 DIAGNOSIS — L03012 Cellulitis of left finger: Secondary | ICD-10-CM

## 2023-07-03 MED ORDER — DOXYCYCLINE HYCLATE 100 MG PO CAPS: 100.0000 mg | ORAL_CAPSULE | Freq: Two times a day (BID) | ORAL | 0 refills | Status: DC

## 2023-07-03 MED ORDER — IBUPROFEN 800 MG PO TABS: 800.0000 mg | ORAL_TABLET | Freq: Three times a day (TID) | ORAL | 0 refills | Status: DC

## 2023-07-03 NOTE — ED Triage Notes (Signed)
Pt c/o left hand swelling and pain that radiates up arm for 2 day. Her middle finger is red and more swollen than other fingers. States she works with [employer]

## 2023-07-03 NOTE — ED Provider Notes (Signed)
[facility]   [id number] 07/03/23 Arrival Time: 0940  ASSESSMENT & PLAN:  1. Paronychia of left middle finger   2. Elevated blood pressure reading with diagnosis of hypertension     Incision and Drainage Procedure Note  Anesthesia: PainEaze Topical  Procedure Details  The procedure, risks and complications have been discussed in detail (including, but not limited to pain and bleeding) with the patient.  The skin induration of LEFT 3rd nailfold was prepped and draped in the usual fashion. After adequate local anesthesia, I&D with a #11 blade was performed on the left nailfold of third digit with purulent drainage.  EBL: minimal Drains: none Packing: n/a Condition: Tolerated procedure well Complications: none.  Meds ordered this encounter  Medications   doxycycline (VIBRAMYCIN) 100 MG capsule    Sig: Take 1 capsule (100 mg total) by mouth 2 (two) times daily.    Dispense:  14 capsule    Refill:  0   ibuprofen (ADVIL) 800 MG tablet    Sig: Take 1 tablet (800 mg total) by mouth 3 (three) times daily with meals.    Dispense:  6 tablet    Refill:  0   Wound care instructions discussed and given in written format. To return in 48 hours for wound check.  Finish all antibiotics.  May f/u here as needed concerning paronychia.    Discharge Instructions      Your blood pressure was noted to be elevated during your visit today. If you are currently taking medication for high blood pressure, please ensure you are taking this as directed. If you do not have a history of high blood pressure and your blood pressure remains persistently elevated, you may need to begin taking a medication at some point. You may return here within the next few days to recheck if unable to see your primary care provider or if you do not have a one.  BP (!) 159/99 (BP Location: Right Arm)   Pulse 81   Temp 97.8 F (36.6 C) (Oral)   Resp 17   LMP 06/28/2023 (Approximate)   SpO2 94%   BP  Readings from Last 3 Encounters:  07/03/23 (!) 159/99  05/03/23 (!) 144/91  03/22/23 (!) 162/106         Reviewed expectations re: course of current medical issues. Questions answered. Outlined signs and symptoms indicating need for more acute intervention. Patient verbalized understanding. After Visit Summary given.   SUBJECTIVE:  Monique [name] is a 52 y.o. female who presents with a possible infection of her LEFT 3rd finger; around nailfold; x 24-48 hours; painful. Denies drainage/bleeding. Denies injury. Afebrile.  Increased blood pressure noted today. Reports that she is treated for HTN. She reports no symptoms. Taking meds as directed.  OBJECTIVE:  Vitals:   07/03/23 1038  BP: (!) 159/99  Pulse: 81  Resp: 17  Temp: 97.8 F (36.6 C)  TempSrc: Oral  SpO2: 94%    General appearance: alert; no distress LUE: approx 0.3 mm induration of lateral nailfold of 3rd finger; tender to touch; no active drainage or bleeding; mild overlying erythema; normal distal sensation and capillary refill Psychological: alert and cooperative; normal mood and affect  No Known Allergies  Past Medical History:  Diagnosis Date   COVID-19 virus infection 11/23/2019   Exertional chest pain 05/13/2017   Fatigue 03/07/2021   Groin pain 03/07/2021   HIV (human immunodeficiency virus infection) (HCC)    Hypertension    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   Lower extremity edema 01/12/2015   Lung abscess (HCC)    Morbid obesity (HCC)    Pneumonia    TB (tuberculosis)    Tonsillitis    Vaccine counseling 06/06/2022   Viral URI 09/16/2017   Weight gain 10/27/2018   Social History   Socioeconomic History   Marital status: Single    Spouse name: Not on file   Number of children: Not on file   Years of education: Not on file   Highest education level: Not on file  Occupational History   Not on file  Tobacco Use   Smoking status: Former     Current packs/day: 0.00    Types: Cigarettes    Quit date: 12/22/2013    Years since quitting: 9.5   Smokeless tobacco: Never  Vaping Use   Vaping status: Never Used  Substance and Sexual Activity   Alcohol use: No    Alcohol/week: 0.0 standard drinks of alcohol   Drug use: No   Sexual activity: Not Currently    Partners: Male    Birth control/protection: None  Other Topics Concern   Not on file  Social History Narrative   Not on file   Social Determinants of Health   Financial Resource Strain: Not on file  Food Insecurity: Not on file  Transportation Needs: Not on file  Physical Activity: Not on file  Stress: Not on file  Social Connections: Not on file   Family History  Problem Relation Age of Onset   Hypertension Mother    Hypertension Father    Diabetes Sister    Hypertension Sister    Past Surgical History:  Procedure Laterality Date   CESAREAN SECTION     VIDEO BRONCHOSCOPY Bilateral 04/13/2013   Procedure: VIDEO BRONCHOSCOPY WITHOUT FLUORO;  Surgeon: Kalman Shan, MD;  Location: [facility];  Service: Cardiopulmonary;  Laterality: Bilateral;            Mardella Layman, MD 07/03/23 1156

## 2023-07-03 NOTE — Discharge Instructions (Addendum)
Your blood pressure was noted to be elevated during your visit today. If you are currently taking medication for high blood pressure, please ensure you are taking this as directed. If you do not have a history of high blood pressure and your blood pressure remains persistently elevated, you may need to begin taking a medication at some point. You may return here within the next few days to recheck if unable to see your primary care provider or if you do not have a one.  BP (!) 159/99 (BP Location: Right Arm)   Pulse 81   Temp 97.8 F (36.6 C) (Oral)   Resp 17   LMP 06/28/2023 (Approximate)   SpO2 94%   BP Readings from Last 3 Encounters:  07/03/23 (!) 159/99  05/03/23 (!) 144/91  03/22/23 (!) 162/106

## 2023-07-16 ENCOUNTER — Telehealth (HOSPITAL_COMMUNITY): Payer: Self-pay | Admitting: *Deleted

## 2023-07-16 NOTE — Telephone Encounter (Signed)
T lost work note needs reprinted. Advised pt at front desk

## 2023-07-30 ENCOUNTER — Encounter: Payer: BLUE CROSS/BLUE SHIELD | Admitting: *Deleted

## 2023-07-31 ENCOUNTER — Other Ambulatory Visit: Payer: Self-pay | Admitting: *Deleted

## 2023-07-31 ENCOUNTER — Encounter: Payer: BLUE CROSS/BLUE SHIELD | Admitting: *Deleted

## 2023-07-31 ENCOUNTER — Other Ambulatory Visit: Payer: Self-pay

## 2023-07-31 VITALS — BP 153/94 | HR 88 | Temp 98.3°F | Wt 253.5 lb

## 2023-07-31 DIAGNOSIS — Z006 Encounter for examination for normal comparison and control in clinical research program: Secondary | ICD-10-CM

## 2023-07-31 MED ORDER — STUDY - MK-8591A-052 - BICTEGRAVIR/EMTRICITABINE/TENOFOVIR ALAFENAMIDE (BIKTARVY) 50-200-25 MG OR PLACEBO TABLET (PI-VAN DAM): 1.0000 | ORAL_TABLET | Freq: Every day | ORAL | Status: DC

## 2023-07-31 MED ORDER — STUDY - MK-8591A-052 - MK-8591A 100/0.25 MG OR PLACEBO TABLET (PI-VAN DAM): 1.0000 | ORAL_TABLET | Freq: Every day | ORAL | Status: DC

## 2023-07-31 MED ORDER — STUDY - MK-8591A-052 - BICTEGRAVIR/EMTRICITABINE/TENOFOVIR ALAFENAMIDE (BIKTARVY) 50-200-25 MG OR PLACEBO TABLET (PI-VAN DAM): 1.0000 | ORAL_TABLET | Freq: Every day | ORAL | 0 refills | Status: DC

## 2023-07-31 MED ORDER — STUDY - MK-8591A-052 - MK-8591A 100/0.25 MG OR PLACEBO TABLET (PI-VAN DAM): 1.0000 | ORAL_TABLET | Freq: Every day | ORAL | 0 refills | Status: DC

## 2023-07-31 NOTE — Research (Signed)
Monique [name] was here for her week 60 visit for [facility] 052, She did bring her meds back but the counts for both pills are off showing adherence at 59 and 70%. She says she does good when she first leaves here but as time goes by she starts dropping off and forgetting to take them. I told her to make sure she takes them with her Amlodipine everyday and that way it would be easier for her. We did talk about the consequences of poor adherence and strategies to improve adherence. She was seen at [facility] about a month ago for an infected finger. It was I & Ded and she was given doxycycline for treatment. It is completely healed now. She will be returning in February for the next study visit.

## 2023-09-19 ENCOUNTER — Other Ambulatory Visit: Payer: Self-pay

## 2023-09-19 ENCOUNTER — Encounter (HOSPITAL_COMMUNITY): Payer: Self-pay | Admitting: Emergency Medicine

## 2023-09-19 ENCOUNTER — Ambulatory Visit (HOSPITAL_COMMUNITY)
Admission: EM | Admit: 2023-09-19 | Discharge: 2023-09-19 | Disposition: A | Discharge status: Home / Self Care | Payer: BLUE CROSS/BLUE SHIELD | Attending: Nurse Practitioner | Admitting: Nurse Practitioner

## 2023-09-19 DIAGNOSIS — R6883 Chills (without fever): Secondary | ICD-10-CM | POA: Insufficient documentation

## 2023-09-19 LAB — POCT INFLUENZA A/B
Influenza A, POC: NEGATIVE
Influenza B, POC: NEGATIVE

## 2023-09-19 LAB — SARS CORONAVIRUS 2 (TAT 6-24 HRS): SARS Coronavirus 2: NEGATIVE

## 2023-09-19 NOTE — ED Provider Notes (Signed)
[facility]    CSN: [id number] Arrival date & time: 09/19/23  4098      History   Chief Complaint Chief Complaint  Patient presents with   Fever    HPI Monique [name] is a 53 y.o. female.   HPI  She is in today for evaluation of fever.  She reports that on last night she was having some fever and chills.  Her temperature is unknown.  She denies any upper respiratory symptoms, shortness of breath or chest pains.  She is experiencing headache however this is related to her blood pressure.  She reports that she has not taken her blood pressure medicine today.  She reports that when she misses her medication or delays she does have headaches with chest tightness.  She does work in Set [employer] and reports that persons around her have been sick with upper respiratory symptoms symptoms.  She has taken a dose of Tylenol. 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   Lower extremity edema 01/12/2015   Lung abscess (HCC)    Morbid obesity (HCC)    Pneumonia    TB (tuberculosis)    Tonsillitis    Vaccine counseling 06/06/2022   Viral URI 09/16/2017   Weight gain 10/27/2018    Patient Active Problem List   Diagnosis Date Noted   Vaccine counseling 06/06/2022   ASCUS with positive high risk HPV cervical 07/10/2021   Asthma 07/10/2021   Finger numbness 07/10/2021   Hyperlipidemia 07/06/2021   Screening for cervical cancer 04/24/2020   Encounter for screening mammogram for malignant neoplasm of breast 04/24/2020   Morbid obesity (HCC)    Seasonal allergies 01/26/2019   Healthcare maintenance 05/19/2018   Exertional chest pain 05/13/2017   Obese 08/10/2015   Essential hypertension 01/12/2015   Lipodystrophy due  to HIV infection and antiretroviral therapy (HCC) 01/12/2015   Human immunodeficiency virus (HIV) disease (HCC) 04/10/2013    Past Surgical History:  Procedure Laterality Date   CESAREAN SECTION     VIDEO BRONCHOSCOPY Bilateral 04/13/2013   Procedure: VIDEO BRONCHOSCOPY WITHOUT FLUORO;  Surgeon: Kalman Shan, MD;  Location: [facility];  Service: Cardiopulmonary;  Laterality: Bilateral;    OB History   No obstetric history on file.      Home Medications    Prior to Admission medications   Medication Sig Start Date End Date Taking? Authorizing Provider  albuterol (VENTOLIN HFA) 108 (90 Base) MCG/ACT inhaler Inhale 1-2 puffs into the lungs every 6 (six) hours as needed for wheezing or shortness of breath. 07/12/22   Gillermo Murdoch A, DO  amLODipine (NORVASC) 10 MG tablet TAKE 1 TABLET(10 MG) BY MOUTH DAILY 06/03/23   Daiva Eves, Lisette Grinder, MD  aspirin EC 81 MG tablet Take 1 tablet (81 mg total) by mouth daily. Swallow whole. 07/06/21   Verdene Lennert, MD  atorvastatin (LIPITOR) 20 MG tablet Take 1 tablet (20 mg total) by mouth daily. 07/06/21 07/06/22  Verdene Lennert, MD  cetirizine (ZYRTEC ALLERGY) 10 MG tablet Take 1 tablet (10 mg total) by mouth daily. 12/24/19 05/21/23  [place], Karma Ganja D, DO  doxycycline (VIBRAMYCIN) 100 MG capsule Take 1 capsule (100 mg total) by mouth 2 (two) times daily. Patient not taking: Reported on 09/19/2023 07/03/23  Mardella Layman, MD  fluticasone (FLONASE) 50 MCG/ACT nasal spray Place 2 sprays into both nostrils daily. 07/12/22 05/21/23  Gillermo Murdoch A, DO  hydrochlorothiazide (HYDRODIURIL) 25 MG tablet Take 1 tablet (25 mg total) by mouth daily. 05/03/23   Horton Finer, PA-C  ibuprofen (ADVIL) 800 MG tablet Take 1 tablet (800 mg total) by mouth 3 (three) times daily with meals. 07/03/23   Mardella Layman, MD  Study [phone] - bictegravir/emtrictabine/tenofovir alafenamide (BIKTARVY) 50-200-25 mg or placebo tablet ([place]) Take 1 tablet  by mouth daily. 07/31/23   Randall Hiss, MD  Study [id number] - MW-1027O 100/0.25 mg or placebo tablet ([place]) Take 1 tablet by mouth daily. 07/31/23   Randall Hiss, MD    Family History Family History  Problem Relation Age of Onset   Hypertension Mother    Hypertension Father    Diabetes Sister    Hypertension Sister     Social History Social History   Tobacco Use   Smoking status: Former    Current packs/day: 0.00    Types: Cigarettes    Quit date: 12/22/2013    Years since quitting: 9.7   Smokeless tobacco: Never  Vaping Use   Vaping status: Never Used  Substance Use Topics   Alcohol use: No    Alcohol/week: 0.0 standard drinks of alcohol   Drug use: No     Allergies   Patient has no known allergies.   Review of Systems Review of Systems   Physical Exam Triage Vital Signs ED Triage Vitals  Encounter Vitals Group     BP 09/19/23 0906 (!) 166/107     Systolic BP Percentile --      Diastolic BP Percentile --      Pulse Rate 09/19/23 0906 89     Resp --      Temp 09/19/23 0906 98.8 F (37.1 C)     Temp Source 09/19/23 0906 Oral     SpO2 09/19/23 0906 97 %     Weight --      Height --      Head Circumference --      Peak Flow --      Pain Score 09/19/23 0904 8     Pain Loc --      Pain Education --      Exclude from Growth Chart --    No data found.  Updated Vital Signs BP (!) 166/107 (BP Location: Right Arm) Comment: has not had blood pressure medicine today. Comment (BP Location): large cuff  Pulse 89   Temp 98.8 F (37.1 C) (Oral)   LMP 08/24/2023   SpO2 97%   Visual Acuity Right Eye Distance:   Left Eye Distance:   Bilateral Distance:    Right Eye Near:   Left Eye Near:    Bilateral Near:     Physical Exam Constitutional:      General: She is not in acute distress.    Appearance: She is obese. She is not ill-appearing, toxic-appearing or diaphoretic.  HENT:     Head: Normocephalic and atraumatic.     Nose:  Nose normal.     Mouth/Throat:     Mouth: Mucous membranes are moist.     Pharynx: No oropharyngeal exudate or posterior oropharyngeal erythema.     Comments: 2+ tonsils bilaterally Cardiovascular:     Rate and Rhythm: Normal rate and regular rhythm.     Pulses: Normal pulses.     Heart  sounds: Normal heart sounds.  Pulmonary:     Effort: Pulmonary effort is normal.     Breath sounds: Normal breath sounds.  Musculoskeletal:        General: Normal range of motion.     Cervical back: Normal range of motion.  Skin:    General: Skin is warm and dry.     Capillary Refill: Capillary refill takes less than 2 seconds.  Neurological:     General: No focal deficit present.     Mental Status: She is alert and oriented to person, place, and time.  Psychiatric:        Mood and Affect: Mood normal.      UC Treatments / Results  Labs (all labs ordered are listed, but only abnormal results are displayed) Labs Reviewed  POCT INFLUENZA A/B - Normal  SARS CORONAVIRUS 2 (TAT 6-24 HRS)    EKG   Radiology No results found.  Procedures Procedures (including critical care time)  Medications Ordered in UC Medications - No data to display  Initial Impression / Assessment and Plan / UC Course  I have reviewed the triage vital signs and the nursing notes.  Pertinent labs & imaging results that were available during my care of the patient were reviewed by me and considered in my medical decision making (see chart for details).     Elevated temp Final Clinical Impressions(s) / UC Diagnoses   Final diagnoses:  Chills (without fever)     Discharge Instructions      Your Influenza is negative. Your COVID is pending. Your results will show in your MyChart. Any positive results will result in a phone call from a nurse with next steps in treatment and recommendations.  You may have an Upper Respiratory Infection. As a reminder a fever is considered 101.5 F. We encourage conservative  treatment with symptom relief. We encourage you to use Tylenol alternating with Ibuprofen for your fever if not contraindicated. (Remember to use as directed do not exceed daily dosing recommendations) We also encourage salt water gargles for your sore throat. You should also consider throat lozenges and chloraseptic spray.  Your cough can be soothed with a cough suppressant.       ED Prescriptions   None    PDMP not reviewed this encounter.   Thad Ranger [place], NP 09/19/23 1050

## 2023-09-19 NOTE — Discharge Instructions (Addendum)
Your Influenza is negative. Your COVID is pending. Your results will show in your MyChart. Any positive results will result in a phone call from a nurse with next steps in treatment and recommendations.  You may have an Upper Respiratory Infection. As a reminder a fever is considered 101.5 F. We encourage conservative treatment with symptom relief. We encourage you to use Tylenol alternating with Ibuprofen for your fever if not contraindicated. (Remember to use as directed do not exceed daily dosing recommendations) We also encourage salt water gargles for your sore throat. You should also consider throat lozenges and chloraseptic spray.  Your cough can be soothed with a cough suppressant.

## 2023-09-19 NOTE — ED Triage Notes (Signed)
Shoulders , and generalized pain.  Reports fever (did not measure temperature).  Patient has been cold and hot, cough.   Symptoms started last night, took tylenol last night

## 2023-09-22 IMAGING — DX DG CHEST 2V
2 series · 2 of 2 positions shown · non-contrast
Comparison: Chest x-ray dated June 16, 2021.

CLINICAL DATA: Shortness of breath with exertion for the past few
days.

EXAM:
CHEST - 2 VIEW

[chest pa]
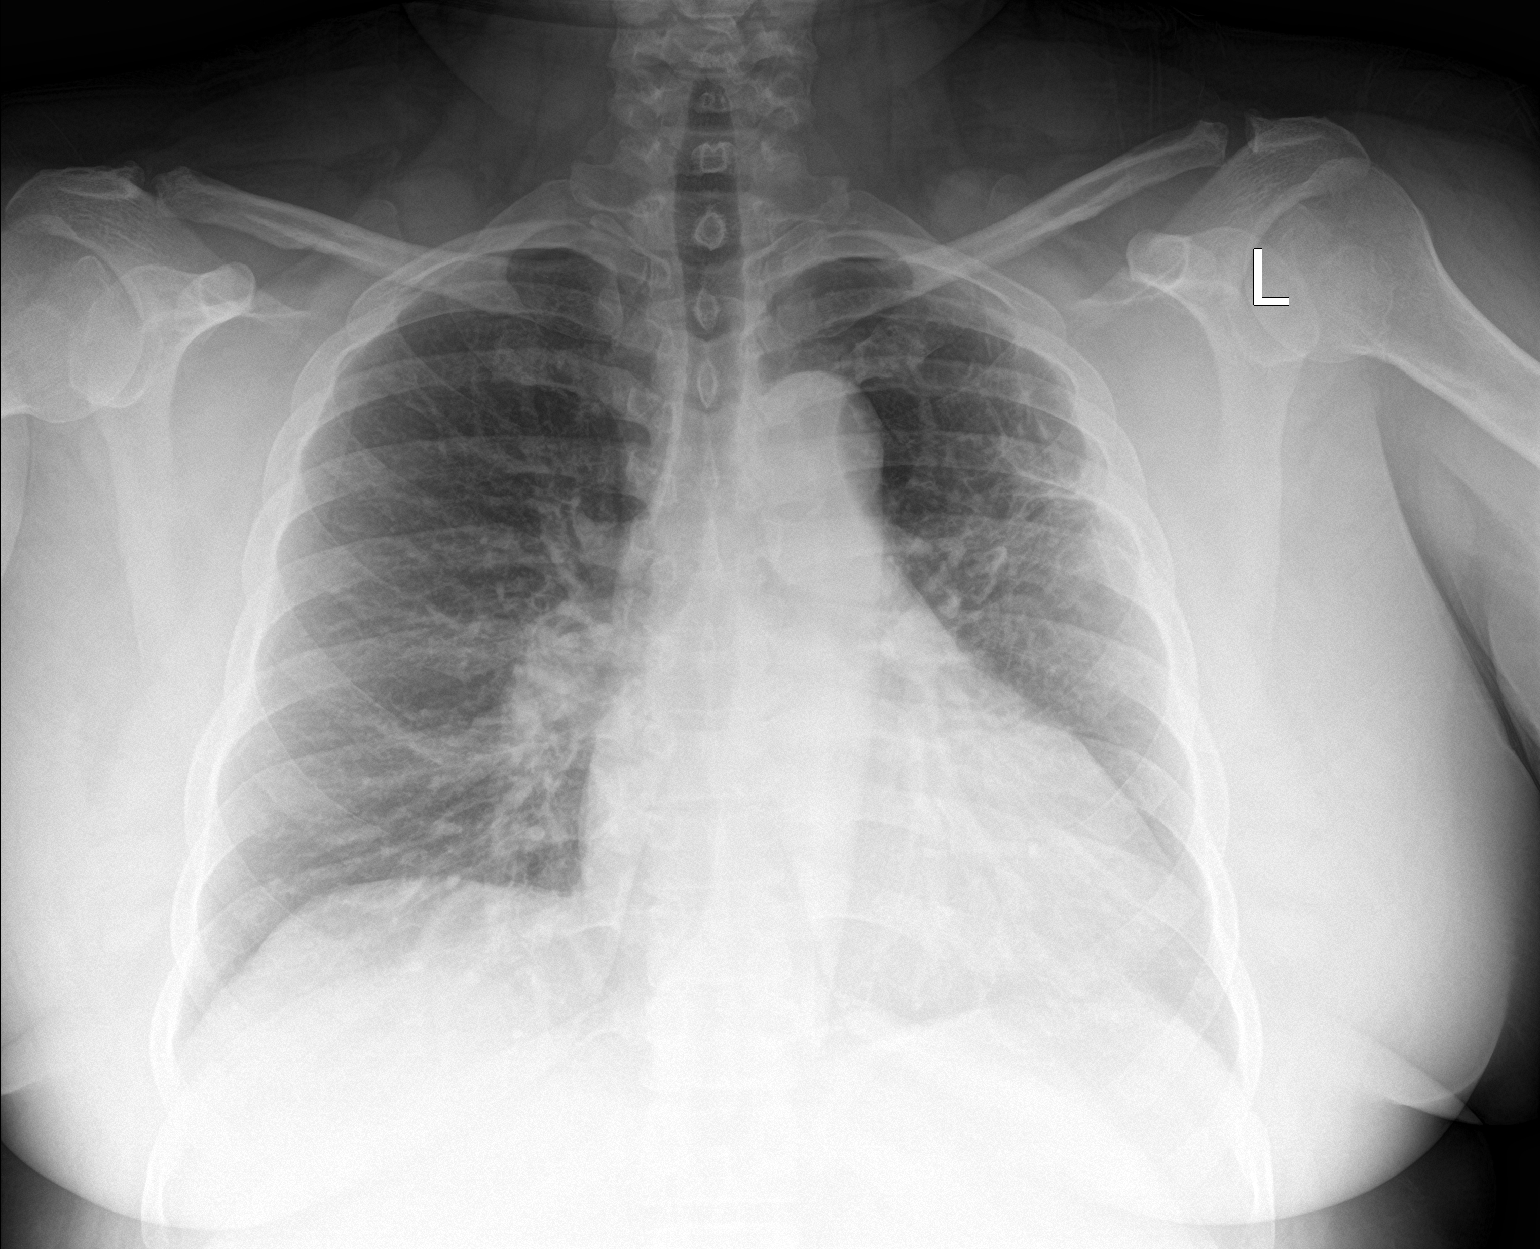

[chest lat]
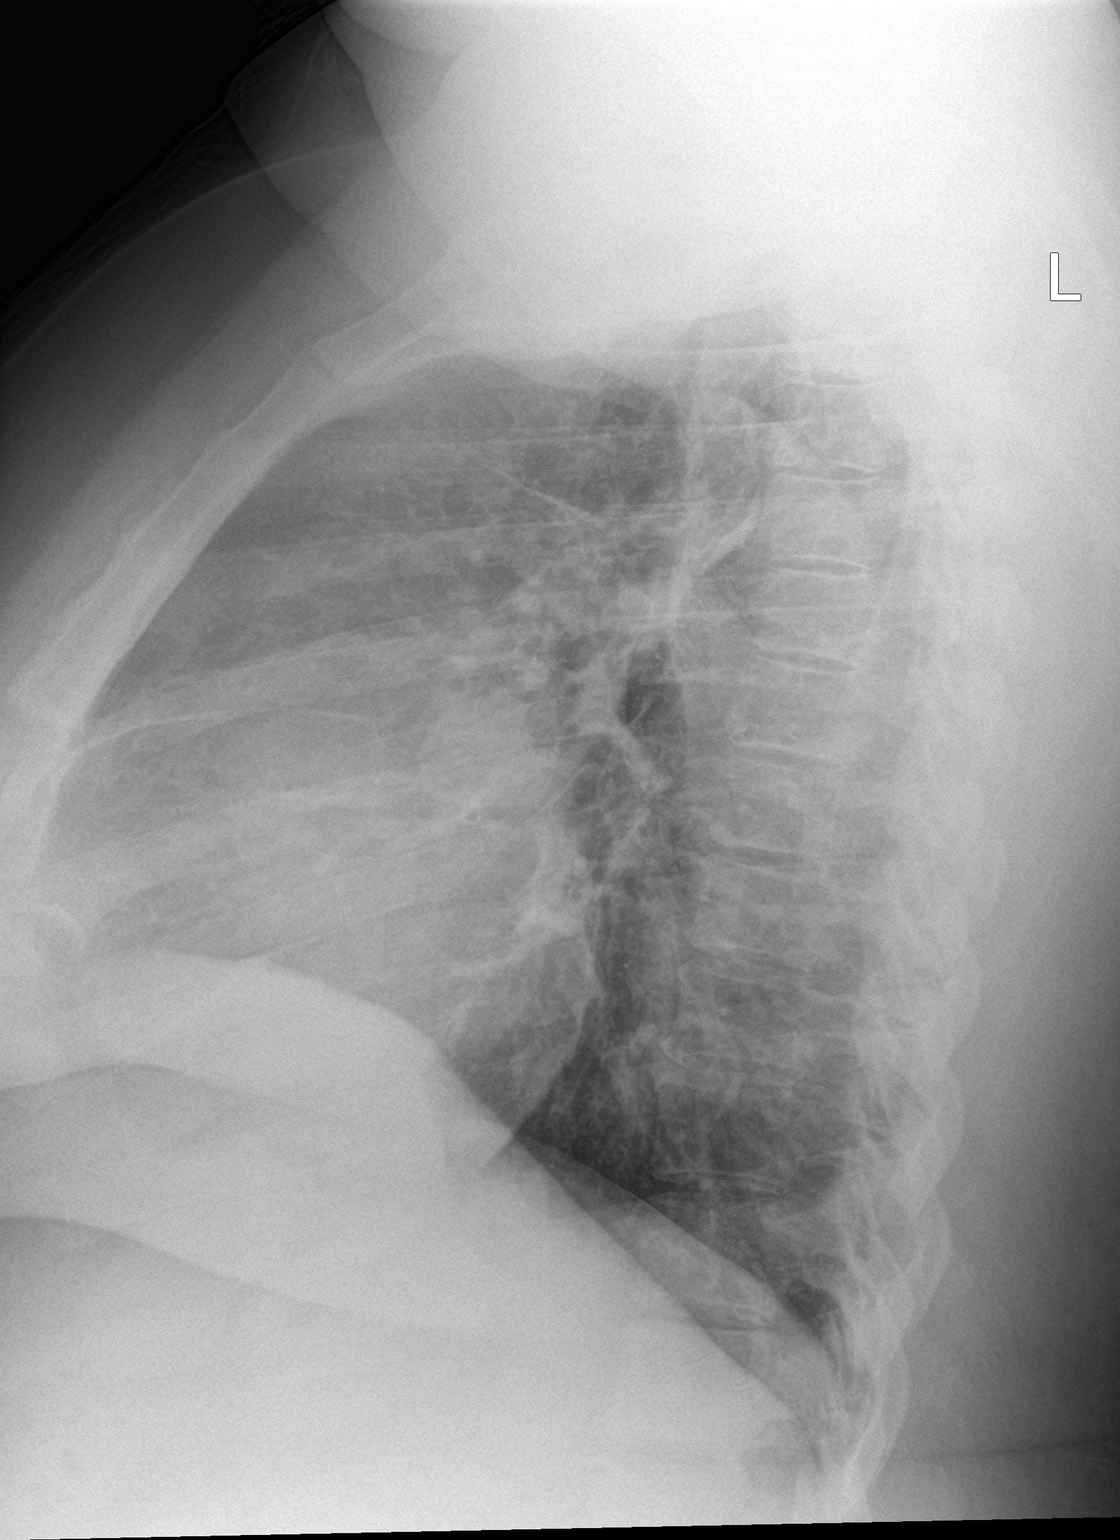

[2 of 2 positions shown; findings below may reference images not displayed]

FINDINGS: Unchanged mild cardiomegaly. Normal pulmonary vascularity. Chronic
pleuroparenchymal scarring in the peripheral left upper lobe. No
focal consolidation, pleural effusion, or pneumothorax. No acute
osseous abnormality.
IMPRESSION: No active cardiopulmonary disease.

## 2023-10-08 ENCOUNTER — Other Ambulatory Visit: Payer: Self-pay

## 2023-10-08 ENCOUNTER — Encounter: Payer: Self-pay | Admitting: Infectious Disease

## 2023-10-08 ENCOUNTER — Ambulatory Visit (INDEPENDENT_AMBULATORY_CARE_PROVIDER_SITE_OTHER): Payer: BLUE CROSS/BLUE SHIELD | Admitting: Infectious Disease

## 2023-10-08 VITALS — BP 196/123 | HR 84 | Resp 16 | Ht 61.0 in | Wt 253.0 lb

## 2023-10-08 DIAGNOSIS — J45909 Unspecified asthma, uncomplicated: Secondary | ICD-10-CM | POA: Diagnosis not present

## 2023-10-08 DIAGNOSIS — I160 Hypertensive urgency: Secondary | ICD-10-CM

## 2023-10-08 DIAGNOSIS — R0600 Dyspnea, unspecified: Secondary | ICD-10-CM

## 2023-10-08 DIAGNOSIS — I10 Essential (primary) hypertension: Secondary | ICD-10-CM

## 2023-10-08 DIAGNOSIS — Z87891 Personal history of nicotine dependence: Secondary | ICD-10-CM

## 2023-10-08 DIAGNOSIS — R87610 Atypical squamous cells of undetermined significance on cytologic smear of cervix (ASC-US): Secondary | ICD-10-CM

## 2023-10-08 DIAGNOSIS — R110 Nausea: Secondary | ICD-10-CM

## 2023-10-08 DIAGNOSIS — R0602 Shortness of breath: Secondary | ICD-10-CM

## 2023-10-08 DIAGNOSIS — Z7185 Encounter for immunization safety counseling: Secondary | ICD-10-CM

## 2023-10-08 DIAGNOSIS — J4530 Mild persistent asthma, uncomplicated: Secondary | ICD-10-CM

## 2023-10-08 DIAGNOSIS — E785 Hyperlipidemia, unspecified: Secondary | ICD-10-CM

## 2023-10-08 DIAGNOSIS — R87810 Cervical high risk human papillomavirus (HPV) DNA test positive: Secondary | ICD-10-CM

## 2023-10-08 DIAGNOSIS — B20 Human immunodeficiency virus [HIV] disease: Secondary | ICD-10-CM | POA: Diagnosis not present

## 2023-10-08 DIAGNOSIS — E782 Mixed hyperlipidemia: Secondary | ICD-10-CM

## 2023-10-08 MED ORDER — AMLODIPINE BESYLATE 10 MG PO TABS: ORAL_TABLET | ORAL | 11 refills | Status: DC

## 2023-10-08 MED ORDER — ALBUTEROL SULFATE HFA 108 (90 BASE) MCG/ACT IN AERS: 1.0000 | INHALATION_SPRAY | Freq: Four times a day (QID) | RESPIRATORY_TRACT | 5 refills | Status: DC | PRN

## 2023-10-08 MED ORDER — HYDROCHLOROTHIAZIDE 25 MG PO TABS: 25.0000 mg | ORAL_TABLET | Freq: Every day | ORAL | 11 refills | Status: DC

## 2023-10-08 MED ORDER — CLONIDINE HCL 0.1 MG PO TABS
0.1000 mg | ORAL_TABLET | Freq: Once | ORAL | Status: AC
  Administered 2023-10-08: 0.1 mg via ORAL

## 2023-10-08 MED ORDER — ATORVASTATIN CALCIUM 20 MG PO TABS: 20.0000 mg | ORAL_TABLET | Freq: Every day | ORAL | 11 refills | Status: DC

## 2023-10-08 NOTE — Patient Instructions (Signed)
Monique [name] will come back for her pneumonia, covid and flu shots on Friday

## 2023-10-08 NOTE — Progress Notes (Signed)
Subjective:  Chief complaint follow-up for HIV disease on medications currently more recently having suffered from some nausea and also some shortness of breath    Patient ID: Monique [name], female    DOB: 07-20-1971, 53 y.o.   MRN: [id number]  HPI  Discussed the use of AI scribe software for clinical note transcription with the patient, who gave verbal consent to proceed.  History of Present Illness   The patient, with a history of HIV and hypertension, is currently participating in a study for a new HIV medication Islatravir/Doravirine vs Biktarvy. She has been on the study medication for over a year and initially felt well. However, she has recently been experiencing a feeling of nausea, which she attributes to the study medication. She denies feeling unwell the entire time she has been on the medication. The patient admits to missing some doses of the study medication. I have counseled her that in addition to putting her at risk for VF there is risk of any side effects due to a  medicine re-emerging. Certainly we see this with boosted PI's all of the time.  The patient also reports having shortness of breath, particularly when walking thought not every day. She has a history of asthma and uses albuterol as needed, which she reports needing approximately once a week when she feels like she is wheezing.  In addition, the patient has been dealing with financial and insurance issues, which have prevented her from obtaining necessary medical care, including a follow-up for an abnormal Pap smear in 2021 and her blood pressure medication. She is currently out of her blood pressure medication, amlodipine, and is paying out of pocket for it. She also reports needing to be on hydrochlorothiazide for her blood pressure. It turns out that she DOES HAVE an [employer] plan but it is one in which [facility] is in network and [facility] is not.       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   Lower extremity edema 01/12/2015   Lung abscess (HCC)    Morbid obesity (HCC)    Pneumonia    TB (tuberculosis)    Tonsillitis    Vaccine counseling 06/06/2022   Viral URI 09/16/2017   Weight gain 10/27/2018    Past Surgical History:  Procedure Laterality Date   CESAREAN SECTION     VIDEO BRONCHOSCOPY Bilateral 04/13/2013   Procedure: VIDEO BRONCHOSCOPY WITHOUT FLUORO;  Surgeon: Kalman Shan, MD;  Location: [facility] ENDOSCOPY;  Service: Cardiopulmonary;  Laterality: Bilateral;    Family History  Problem Relation Age of Onset   Hypertension Mother    Hypertension Father    Diabetes Sister    Hypertension Sister       Social History   Socioeconomic History   Marital status: Single    Spouse name: Not on file   Number of children: Not on file   Years of education: Not on file   Highest education level: Not on file  Occupational History   Not on file  Tobacco Use   Smoking status: Former    Current packs/day: 0.00    Types: Cigarettes    Quit date: 12/22/2013    Years since quitting: 9.8   Smokeless tobacco: Never  Vaping  Use   Vaping status: Never Used  Substance and Sexual Activity   Alcohol use: No    Alcohol/week: 0.0 standard drinks of alcohol   Drug use: No   Sexual activity: Not Currently    Partners: Male    Birth control/protection: None  Other Topics Concern   Not on file  Social History Narrative   Not on file   Social Drivers of Health   Financial Resource Strain: Not on file  Food Insecurity: Not on file  Transportation Needs: Not on file  Physical Activity: Not on file  Stress: Not on file  Social Connections: Not on file    No Known Allergies   Current Outpatient Medications:    aspirin EC 81 MG  tablet, Take 1 tablet (81 mg total) by mouth daily. Swallow whole., Disp: 30 tablet, Rfl: 2   ibuprofen (ADVIL) 800 MG tablet, Take 1 tablet (800 mg total) by mouth 3 (three) times daily with meals., Disp: 6 tablet, Rfl: 0   Study - [id number] - bictegravir/emtrictabine/tenofovir alafenamide (BIKTARVY) 50-200-25 mg or placebo tablet ([place]), Take 1 tablet by mouth daily., Disp: 105 tablet, Rfl: 0   Study - [id number] - [id number] 100/0.25 mg or placebo tablet (PI[place]), Take 1 tablet by mouth daily., Disp: 105 tablet, Rfl: 0   albuterol (VENTOLIN HFA) 108 (90 Base) MCG/ACT inhaler, Inhale 1-2 puffs into the lungs every 6 (six) hours as needed for wheezing or shortness of breath., Disp: 18 g, Rfl: 5   amLODipine (NORVASC) 10 MG tablet, TAKE 1 TABLET(10 MG) BY MOUTH DAILY, Disp: 30 tablet, Rfl: 11   atorvastatin (LIPITOR) 20 MG tablet, Take 1 tablet (20 mg total) by mouth daily., Disp: 30 tablet, Rfl: 11   cetirizine (ZYRTEC ALLERGY) 10 MG tablet, Take 1 tablet (10 mg total) by mouth daily., Disp: 30 tablet, Rfl: 2   doxycycline (VIBRAMYCIN) 100 MG capsule, Take 1 capsule (100 mg total) by mouth 2 (two) times daily. (Patient not taking: Reported on 10/08/2023), Disp: 14 capsule, Rfl: 0   fluticasone (FLONASE) 50 MCG/ACT nasal spray, Place 2 sprays into both nostrils daily., Disp: 1 g, Rfl: 0   hydrochlorothiazide (HYDRODIURIL) 25 MG tablet, Take 1 tablet (25 mg total) by mouth daily., Disp: 30 tablet, Rfl: 11    Review of Systems  Constitutional:  Negative for activity change, appetite change, chills, diaphoresis, fatigue, fever and unexpected weight change.  HENT:  Negative for congestion, rhinorrhea, sinus pressure, sneezing, sore throat and trouble swallowing.   Eyes:  Negative for photophobia and visual disturbance.  Respiratory:  Negative for cough, chest tightness, shortness of breath, wheezing and stridor.   Cardiovascular:  Negative for chest pain, palpitations and leg swelling.   Gastrointestinal:  Negative for abdominal distention, abdominal pain, anal bleeding, blood in stool, constipation, diarrhea, nausea and vomiting.  Genitourinary:  Negative for difficulty urinating, dysuria, flank pain and hematuria.  Musculoskeletal:  Negative for arthralgias, back pain, gait problem, joint swelling and myalgias.  Skin:  Negative for color change, pallor, rash and wound.  Neurological:  Negative for dizziness, tremors, weakness and light-headedness.  Hematological:  Negative for adenopathy. Does not bruise/bleed easily.  Psychiatric/Behavioral:  Negative for agitation, behavioral problems, confusion, decreased concentration, dysphoric mood and sleep disturbance.        Objective:   Physical Exam Constitutional:      General: She is not in acute distress.    Appearance: Normal appearance. She is well-developed. She is not ill-appearing or diaphoretic.  HENT:  Head: Normocephalic and atraumatic.     Right Ear: Hearing and external ear normal.     Left Ear: Hearing and external ear normal.     Nose: No nasal deformity or rhinorrhea.  Eyes:     General: No scleral icterus.    Conjunctiva/sclera: Conjunctivae normal.     Right eye: Right conjunctiva is not injected.     Left eye: Left conjunctiva is not injected.     Pupils: Pupils are equal, round, and reactive to light.  Neck:     Vascular: No JVD.  Cardiovascular:     Rate and Rhythm: Normal rate and regular rhythm.     Heart sounds: S1 normal and S2 normal. No murmur heard.    No friction rub. No gallop.  Pulmonary:     Effort: No respiratory distress.     Breath sounds: No stridor. No wheezing, rhonchi or rales.  Abdominal:     General: Bowel sounds are normal. There is no distension.     Palpations: Abdomen is soft.     Tenderness: There is no abdominal tenderness.  Musculoskeletal:        General: Normal range of motion.     Right shoulder: Normal.     Left shoulder: Normal.     Cervical back: Normal  range of motion and neck supple.     Right hip: Normal.     Left hip: Normal.     Right knee: Normal.     Left knee: Normal.  Lymphadenopathy:     Head:     Right side of head: No submandibular, preauricular or posterior auricular adenopathy.     Left side of head: No submandibular, preauricular or posterior auricular adenopathy.     Cervical: No cervical adenopathy.     Right cervical: No superficial or deep cervical adenopathy.    Left cervical: No superficial or deep cervical adenopathy.  Skin:    General: Skin is warm and dry.     Coloration: Skin is not pale.     Findings: No abrasion, bruising, ecchymosis, erythema, lesion or rash.     Nails: There is no clubbing.  Neurological:     Mental Status: She is alert and oriented to person, place, and time.     Sensory: No sensory deficit.     Coordination: Coordination normal.     Gait: Gait normal.  Psychiatric:        Attention and Perception: She is attentive.        Mood and Affect: Mood normal.        Speech: Speech normal.        Behavior: Behavior normal. Behavior is cooperative.        Thought Content: Thought content normal.        Judgment: Judgment normal.           Assessment & Plan:   Assessment and Plan    HIV On study medication with reported intermittent nausea.  --Viral load undetectable and CD4 count robust (898). Non-adherence noted with some missed doses. -Encouraged consistent medication adherence to maintain viral suppression and avoid potential side effects from inconsistent dosing.  Hypertension with Hypertensive urgency today in clinic with BP in the 190s/120s initially  sp clonidine here in clinic with reduction in BP. Patient denied symptoms of hypertensive urgency and will go and pick up her BP medications Out of amlodipine and hydrochlorothiazide. -Refill amlodipine and hydrochlorothiazide prescriptions.  Asthma Reports occasional wheezing and use of albuterol as needed. --  continue  albuterol -Consider adding a maintenance steroid inhaler such as fluticasone.  Shortness of Breath New complaint, possibly related to asthma. -Refer to Pulmonology for further evaluation.  Hyperlipidemia Discussed the need for cholesterol-lowering medication due to increased risk of heart disease in HIV patients over 40. -will start atorvastatin   Abnormal Pap Smear Not followed up due to lack of insurance. -Explore options for gynecology referral and follow-up.--she will need to go to an [facility] facility in [facility]. She did no show for 2 prior scheduled visits in 2023 in our system so I hope that does not happen with new referral because many offices are not as "forgiving" of no shows as ours is  Medical sales representative out of network, impacting access to care. -Work with [employer] to explore better insurance options.  Vaccinations Overdue for pneumonia vaccine, and flu and COVID vaccines recommended. -Administer pneumonia, flu, and COVID vaccines today if possible but she later decided to come back on Friday for these      I have personally spent 40  minutes involved in face-to-face and non-face-to-face activities for this patient on the day of the visit. Professional time spent includes the following activities: Preparing to see the patient (review of tests), Obtaining and/or reviewing separately obtained history (admission/discharge record), Performing a medically appropriate examination and/or evaluation , Ordering medications/tests/procedures, referring and communicating with other health care professionals, Documenting clinical information in the EMR, Independently interpreting results (not separately reported), Communicating results to the patient/family/caregiver, Counseling and educating the patient/family/caregiver and Care coordination (not separately reported).

## 2023-10-09 ENCOUNTER — Encounter: Payer: BLUE CROSS/BLUE SHIELD | Admitting: Student

## 2023-10-09 NOTE — Progress Notes (Deleted)
 53 year old female history of hypertension, asthma, HIV with lipodystrophy, class IV obesity 07/06/2021  Patient with history of hypertension and has previously been prescribed amlodipine 10 mg daily and hydrochlorothiazide 25 mg daily.  At her infectious disease visit yesterday her blood pressure was elevated with systolic in the 190s and she had been out of her medications. Blood pressure today  HIV continues to be well-controlled on ***with her last CD4 count in the 800s and HIV RNA undetectable on 07/31/2023.  She continues to follow with infectious disease and saw them last on 08/06/2024.  Abnormal Pap 04/15/2020 with atypical squamous cells of undetermined significance and high risk HPV positive She saw infectious disease yesterday who referred her to gynecology for cervical cancer screening  Asthma  Hyperlipidemia Restarted on atorvastatin at infectious disease 10/08/2023.  Vaccines-plan was to give pneumonia, flu, and COVID vaccines at ID, did she get these?  And can she go back on Friday?

## 2023-10-14 ENCOUNTER — Ambulatory Visit (HOSPITAL_COMMUNITY)
Admission: EM | Admit: 2023-10-14 | Discharge: 2023-10-14 | Disposition: A | Discharge status: Home / Self Care | Payer: BLUE CROSS/BLUE SHIELD | Attending: Family Medicine | Admitting: Family Medicine

## 2023-10-14 ENCOUNTER — Ambulatory Visit (INDEPENDENT_AMBULATORY_CARE_PROVIDER_SITE_OTHER): Payer: BLUE CROSS/BLUE SHIELD

## 2023-10-14 ENCOUNTER — Other Ambulatory Visit: Payer: Self-pay

## 2023-10-14 ENCOUNTER — Encounter (HOSPITAL_COMMUNITY): Payer: Self-pay | Admitting: *Deleted

## 2023-10-14 DIAGNOSIS — R051 Acute cough: Secondary | ICD-10-CM | POA: Diagnosis not present

## 2023-10-14 DIAGNOSIS — B20 Human immunodeficiency virus [HIV] disease: Secondary | ICD-10-CM

## 2023-10-14 DIAGNOSIS — J069 Acute upper respiratory infection, unspecified: Secondary | ICD-10-CM

## 2023-10-14 MED ORDER — METHYLPREDNISOLONE 4 MG PO TBPK
ORAL_TABLET | ORAL | 0 refills | Status: DC
Start: 2023-10-14 — End: 2023-11-13

## 2023-10-14 NOTE — ED Provider Notes (Addendum)
[facility]    CSN: [id number] Arrival date & time: 10/14/23  0845      History   Chief Complaint Chief Complaint  Patient presents with   Sore Throat   Cough   Back Pain    HPI Monique [name] is a 53 y.o. female.   Patient presenting with worsening cough, malaise and chills since Friday of last week.  Patient states that she is coughing up colored sputum.  Patient notes that she has been taking Tylenol because she feels feverish.  Patient's temperature is approximately 92 Fahrenheit in the room.  Patient denies any recent sick contacts and notes that she went to work last Friday without any other concerns.  Patient does have a history of HIV.  Last CD4 count in December was appropriate.   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   Lower extremity edema 01/12/2015   Lung abscess (HCC)    Morbid obesity (HCC)    Pneumonia    TB (tuberculosis)    Tonsillitis    Vaccine counseling 06/06/2022   Viral URI 09/16/2017   Weight gain 10/27/2018    Patient Active Problem List   Diagnosis Date Noted   Vaccine counseling 06/06/2022   ASCUS with positive high risk HPV cervical 07/10/2021   Asthma 07/10/2021   Finger numbness 07/10/2021   Hyperlipidemia 07/06/2021   Screening for cervical cancer 04/24/2020   Encounter for screening mammogram for malignant neoplasm of breast 04/24/2020   Morbid obesity (HCC)    Seasonal allergies 01/26/2019   Healthcare maintenance 05/19/2018   Exertional chest pain 05/13/2017   Obese 08/10/2015   Essential hypertension 01/12/2015   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      Home Medications    Prior to Admission medications   Medication Sig Start Date End Date Taking? Authorizing Provider  albuterol (VENTOLIN HFA) 108 (90 Base) MCG/ACT inhaler Inhale 1-2 puffs into the lungs every 6 (six) hours as needed for wheezing or shortness of breath. 10/08/23  Yes Daiva Eves, Lisette Grinder, MD  amLODipine (NORVASC) 10 MG tablet TAKE 1 TABLET(10 MG) BY MOUTH DAILY 10/08/23  Yes Daiva Eves, Lisette Grinder, MD  aspirin EC 81 MG tablet Take 1 tablet (81 mg total) by mouth daily. Swallow whole. 07/06/21  Yes Verdene Lennert, MD  atorvastatin (LIPITOR) 20 MG tablet Take 1 tablet (20 mg total) by mouth daily. 10/08/23 10/07/24 Yes Randall Hiss, MD  hydrochlorothiazide (HYDRODIURIL) 25 MG tablet Take 1 tablet (25 mg total) by mouth daily. 10/08/23  Yes Daiva Eves, Lisette Grinder, MD  ibuprofen (ADVIL) 800 MG tablet Take 1 tablet (800 mg total) by mouth 3 (three) times daily with meals. 07/03/23  Yes Mardella Layman, MD  methylPREDNISolone (MEDROL DOSEPAK) 4 MG TBPK tablet Follow instructions  on package 10/14/23  Yes Brenton Grills, MD  Study [phone] - bictegravir/emtrictabine/tenofovir alafenamide (BIKTARVY) 50-200-25 mg or placebo tablet (PI-[place]) Take 1 tablet by mouth daily. 07/31/23  Yes Daiva Eves, Lisette Grinder, MD  cetirizine (ZYRTEC ALLERGY) 10 MG tablet Take 1 tablet (10 mg total) by mouth daily. 12/24/19 05/21/23  [place], Karma Ganja D, DO  doxycycline (VIBRAMYCIN) 100 MG capsule Take 1 capsule (100 mg total) by mouth 2 (two) times daily. Patient not taking: Reported on 10/08/2023 07/03/23   Mardella Layman, MD  fluticasone [facility])  50 MCG/ACT nasal spray Place 2 sprays into both nostrils daily. 07/12/22 05/21/23  Claudie Leach, DO  Study - [id number] - [id number] 100/0.25 mg or placebo tablet (PI-[place]) Take 1 tablet by mouth daily. 07/31/23   Randall Hiss, MD    Family History Family History  Problem Relation Age of Onset   Hypertension Mother    Hypertension Father    Diabetes Sister    Hypertension Sister     Social History Social History   Tobacco Use   Smoking status: Former    Current packs/day: 0.00    Types: Cigarettes    Quit date: 12/22/2013    Years since quitting: 9.8   Smokeless tobacco: Never  Vaping Use   Vaping status: Never Used  Substance Use Topics   Alcohol use: No    Alcohol/week: 0.0 standard drinks of alcohol   Drug use: No     Allergies   Patient has no known allergies.   Review of Systems Review of Systems  Respiratory:  Positive for cough.   Musculoskeletal:  Positive for back pain.     Physical Exam Triage Vital Signs ED Triage Vitals  Encounter Vitals Group     BP 10/14/23 0926 (!) 135/92     Systolic BP Percentile --      Diastolic BP Percentile --      Pulse Rate 10/14/23 0926 93     Resp 10/14/23 0926 20     Temp 10/14/23 0926 (!) 92 F (33.3 C)     Temp src --      SpO2 10/14/23 0926 94 %     Weight --      Height --      Head Circumference --      Peak Flow --      Pain Score 10/14/23 0923 8     Pain Loc --      Pain Education --      Exclude from Growth Chart --    No data found.  Updated Vital Signs BP (!) 135/92   Pulse 93   Temp (!) 92 F (33.3 C)   Resp 20   LMP 09/27/2023 (Approximate)   SpO2 94%   Visual Acuity Right Eye Distance:   Left Eye Distance:   Bilateral Distance:    Right Eye Near:   Left Eye Near:    Bilateral Near:     Physical Exam Constitutional:      Appearance: She is well-developed.  HENT:     Mouth/Throat:     Mouth: Mucous membranes are moist. No oral lesions.     Pharynx: Posterior  oropharyngeal erythema present. No oropharyngeal exudate or uvula swelling.     Tonsils: No tonsillar exudate or tonsillar abscesses.  Cardiovascular:     Rate and Rhythm: Normal rate and regular rhythm.     Heart sounds: Normal heart sounds.  Pulmonary:     Breath sounds: No  stridor. Wheezing (Expiratory wheeze noted) present. No rales.  Neurological:     Mental Status: She is alert.      UC Treatments / Results  Labs (all labs ordered are listed, but only abnormal results are displayed) Labs Reviewed - No data to display  EKG   Radiology DG Chest 2 View Result Date: 10/14/2023 CLINICAL DATA:  Pharyngitis, cough and back pain for the past 2 days. EXAM: CHEST - 2 VIEW COMPARISON:  11/03/2022 FINDINGS: The cardiac silhouette remains mildly enlarged. Stable left upper lung zone scarring. Stable mild peribronchial thickening. No interval airspace consolidation or pleural fluid. Unremarkable bones. IMPRESSION: Stable mild cardiomegaly and mild bronchitic changes. Electronically Signed   By: Beckie Salts M.D.   On: 10/14/2023 11:37    Procedures Procedures (including critical care time)  Medications Ordered in UC Medications - No data to display  Initial Impression / Assessment and Plan / UC Course  I have reviewed the triage vital signs and the nursing notes.  Pertinent labs & imaging results that were available during my care of the patient were reviewed by me and considered in my medical decision making (see chart for details).     Patient presenting with acute onset cough as well as decreased saturations.  Patient also has noted wheezing on exam.  Chest x-ray was done which showed bronchitic change without any signs of pneumonia as well as some mild enlargement of the cardiac silhouette.  Will go ahead and give patient Medrol Dosepak at this time.  Advised patient that if her symptoms do not improve then to follow back up in clinic.  Final diagnoses:  Viral upper respiratory  tract infection     Discharge Instructions      Please follow the instructions on your package regarding your steroid.  Please follow-up with [place] if you feel like you are not improving despite     ED Prescriptions     Medication Sig Dispense Auth. Provider   methylPREDNISolone (MEDROL DOSEPAK) 4 MG TBPK tablet Follow instructions on package 1 each Brenton Grills, MD      PDMP not reviewed this encounter.   Brenton Grills, MD 10/14/23 1154    Brenton Grills, MD 10/14/23 1155

## 2023-10-14 NOTE — ED Triage Notes (Signed)
PT reports since SAT night she has had a sore throat ,cough and back pain.

## 2023-10-14 NOTE — Discharge Instructions (Addendum)
Please follow the instructions on your package regarding your steroid.  Please follow-up with [place] if you feel like you are not improving despite

## 2023-10-16 ENCOUNTER — Encounter: Payer: BLUE CROSS/BLUE SHIELD | Admitting: Student

## 2023-10-16 ENCOUNTER — Ambulatory Visit: Payer: BLUE CROSS/BLUE SHIELD | Admitting: Student

## 2023-10-16 VITALS — BP 164/111 | HR 78 | Temp 98.4°F | Ht 61.0 in | Wt 251.0 lb

## 2023-10-16 DIAGNOSIS — R7303 Prediabetes: Secondary | ICD-10-CM

## 2023-10-16 DIAGNOSIS — J069 Acute upper respiratory infection, unspecified: Secondary | ICD-10-CM | POA: Diagnosis not present

## 2023-10-16 DIAGNOSIS — E119 Type 2 diabetes mellitus without complications: Secondary | ICD-10-CM | POA: Insufficient documentation

## 2023-10-16 DIAGNOSIS — I10 Essential (primary) hypertension: Secondary | ICD-10-CM | POA: Diagnosis not present

## 2023-10-16 DIAGNOSIS — J4530 Mild persistent asthma, uncomplicated: Secondary | ICD-10-CM

## 2023-10-16 MED ORDER — ALBUTEROL SULFATE HFA 108 (90 BASE) MCG/ACT IN AERS: 1.0000 | INHALATION_SPRAY | Freq: Four times a day (QID) | RESPIRATORY_TRACT | 5 refills | Status: DC | PRN

## 2023-10-16 MED ORDER — OLMESARTAN MEDOXOMIL 20 MG PO TABS: 20.0000 mg | ORAL_TABLET | Freq: Every day | ORAL | 3 refills | Status: DC

## 2023-10-16 NOTE — Patient Instructions (Signed)
  Thank you, Monique [name], for allowing [place] to provide your care today. Today we discussed . . .  > Respiratory illness       -Update you to pick up your albuterol inhaler and start using it every 4-6 hours as needed for shortness of breath.  Please also start taking Zyrtec and Flonase nasal spray daily.  You can also use over-the-counter cough and cold medicines for people with hypertension such as Coricidin.  If you have any worsening shortness of breath, fevers, or increase in sputum production please let us know and let infectious disease team know when you see them Friday. > High blood pressure       -Please continue to take your amlodipine 10 mg daily and hydrochlorothiazide 25 mg daily.  We are going to start a new medication called olmesartan 20 mg daily.  Please pick up an arm blood pressure cuff at the pharmacy and keep a log of your blood pressures.  We will review these at your next visit and decide if we need to change any medications.  If we get the doses right of these medications we can combine them to 1 pill.   Follow up:  1 month     Remember:     Should you have any questions or concerns please call the internal medicine clinic at [phone].     Rocky Morel, DO [facility]

## 2023-10-16 NOTE — Assessment & Plan Note (Signed)
Patient has had about 5 days of cough productive of yellowish sputum, dyspnea on exertion, and self-limited sore throat that has been stable over the last couple days.  She saw urgent care a couple days ago and had a chest x-ray without any focal consolidations or signs of atypical pneumonia.  She has a history of HIV but this is well-controlled with last CD4 count 2 months ago being in the 800s.  Vitals are stable with 97% saturation on room air.  She does have moderate expiratory wheezing on exam but has been out of her albuterol inhaler.  This is most likely a viral respiratory infection without significant hypoxia or systemic infection.  At the moment low likelihood of bacterial infection and well-controlled HIV lowers the risk of opportunistic infection.  Will treat supportively. - Daily antihistamine and fluticasone nasal spray, as needed cough and cold medicine for people with hypertension, continue steroid pack from urgent care - Return precautions discussed, if having more signs of bacterial pneumonia I would have a low threshold to start coverage for atypical pneumonia

## 2023-10-16 NOTE — Progress Notes (Signed)
   CC: Overdue Routine Follow Up for hypertension after last office visit 07/06/2021  HPI:  Monique [name] is a 53 y.o. female with pertinent PMH of HTN, asthma, HIV with lipodystrophy, and class IV obesity who presents as above. Please see assessment and plan below for further details.  Review of Systems:   Pertinent items noted in HPI and/or A&P.  Physical Exam:  Vitals:   10/16/23 0919 10/16/23 1023  BP: (!) 176/106 (!) 164/111  Pulse: 88 78  Temp: 98.4 F (36.9 C)   TempSrc: Oral   SpO2: 97%   Weight: 251 lb (113.9 kg)   Height: 5\' 1"  (1.549 m)     Constitutional: Tired appearing middle-age female. In no acute distress. HEENT: Normocephalic, atraumatic, Sclera non-icteric, PERRL, EOM intact, oropharynx without significant erythema or exudates Cardio:Regular rate and rhythm. 2+ bilateral radial pulses. Pulm: Moderate diffuse expiratory wheezing. Normal work of breathing on room air. Abdomen: Soft, non-tender, non-distended, positive bowel sounds. EAV:[id number] for extremity edema. Skin:Warm and dry. Neuro:Alert and oriented x3. No focal deficit noted. Psych:Pleasant mood and affect.   Assessment & Plan:   Essential hypertension Blood pressure today elevated at 164/111.  Patient is currently prescribed amlodipine 10 mg daily and hydrochlorothiazide 25 mg daily but has not taken this medication today.  She recently resume these medications after infectious disease visit on 10/08/2023.  She does not yet have a blood pressure cuff but will plan to get one and keep a blood pressure log. - Continue amlodipine 10 mg daily, hydrochlorothiazide 25 mg daily, and add olmesartan 20 mg daily - Keep blood pressure log and review at next visit in 4 weeks - BMP today  Prediabetes Patient with a history of prediabetes and currently on a steroid due to upper respiratory tract infection.  Will check an A1c today.  Upper respiratory tract infection Patient has had about 5 days of cough  productive of yellowish sputum, dyspnea on exertion, and self-limited sore throat that has been stable over the last couple days.  She saw urgent care a couple days ago and had a chest x-ray without any focal consolidations or signs of atypical pneumonia.  She has a history of HIV but this is well-controlled with last CD4 count 2 months ago being in the 800s.  Vitals are stable with 97% saturation on room air.  She does have moderate expiratory wheezing on exam but has been out of her albuterol inhaler.  This is most likely a viral respiratory infection without significant hypoxia or systemic infection.  At the moment low likelihood of bacterial infection and well-controlled HIV lowers the risk of opportunistic infection.  Will treat supportively. - Daily antihistamine and fluticasone nasal spray, as needed cough and cold medicine for people with hypertension, continue steroid pack from urgent care - Return precautions discussed, if having more signs of bacterial pneumonia I would have a low threshold to start coverage for atypical pneumonia    Patient discussed with Dr. Theodosia Paling, DO [facility] Internal Medicine Resident PGY-2 Clinic Phone: [phone] Pager: [phone]

## 2023-10-16 NOTE — Assessment & Plan Note (Addendum)
Patient with a history of prediabetes and currently on a steroid due to upper respiratory tract infection.  Will check an A1c today.

## 2023-10-16 NOTE — Assessment & Plan Note (Addendum)
Blood pressure today elevated at 164/111.  Patient is currently prescribed amlodipine 10 mg daily and hydrochlorothiazide 25 mg daily but has not taken this medication today.  She recently resume these medications after infectious disease visit on 10/08/2023.  She does not yet have a blood pressure cuff but will plan to get one and keep a blood pressure log. - Continue amlodipine 10 mg daily, hydrochlorothiazide 25 mg daily, and add olmesartan 20 mg daily - Keep blood pressure log and review at next visit in 4 weeks - BMP today

## 2023-10-17 LAB — BMP8+ANION GAP
Anion Gap: 15 mmol/L (ref 10.0–18.0)
BUN/Creatinine Ratio: 16 (ref 9–23)
BUN: 9 mg/dL (ref 6–24)
CO2: 26 mmol/L (ref 20–29)
Calcium: 8.8 mg/dL (ref 8.7–10.2)
Chloride: 100 mmol/L (ref 96–106)
Creatinine, Ser: 0.57 mg/dL (ref 0.57–1.00)
Glucose: 81 mg/dL (ref 70–99)
Potassium: 3.7 mmol/L (ref 3.5–5.2)
Sodium: 141 mmol/L (ref 134–144)
eGFR: 109 mL/min/{1.73_m2} (ref >59)

## 2023-10-17 LAB — HEMOGLOBIN A1C
Est. average glucose Bld gHb Est-mCnc: 140 mg/dL
Hgb A1c MFr Bld: 6.5 % — ABNORMAL HIGH (ref 4.8–5.6)

## 2023-10-18 ENCOUNTER — Other Ambulatory Visit: Payer: Self-pay

## 2023-10-18 ENCOUNTER — Encounter: Payer: BLUE CROSS/BLUE SHIELD | Admitting: *Deleted

## 2023-10-18 DIAGNOSIS — Z006 Encounter for examination for normal comparison and control in clinical research program: Secondary | ICD-10-CM

## 2023-10-18 MED ORDER — STUDY - MK-8591A-052 - BICTEGRAVIR/EMTRICITABINE/TENOFOVIR ALAFENAMIDE (BIKTARVY) 50-200-25 MG OR PLACEBO TABLET (PI-VAN DAM): 1.0000 | ORAL_TABLET | Freq: Every day | ORAL | 0 refills | Status: DC

## 2023-10-18 MED ORDER — STUDY - MK-8591A-052 - MK-8591A 100/0.25 MG OR PLACEBO TABLET (PI-VAN DAM): 1.0000 | ORAL_TABLET | Freq: Every day | ORAL | 0 refills | Status: DC

## 2023-10-18 NOTE — Research (Signed)
Samaiya seen today for her week 72 visit for [id number], A Phase 3, Randomized, Active-Controlled, Double-Blind Clinical Study to Evaluate a Switch to Doravirine/Islatravir (DOR/ISL 100 mg/0.25 mg) Once-Daily in Participants With HIV-1 Who Are Virologically Suppressed on Bictegravir/Emtricitabine/Tenofovir Alafenamide (BIC/FTC/TAF). She did return study IP bottles but adherence was low with both medications. By pill count adherence 58% with Biktarvy/placebo and 49% adherence with DOR/ISL/Placebo. Discussed possible barriers to taking medication. Participant states that she does well taking the medications after each visit and then she does not know why she cannot remember to take study IP. We reviewed the importance of taking study medication everyday at around the same time. Provided her with another pill box and encouraged her to use it to see if this helps with her adherence.  BP was elevated today. Denied any headache, visual changes, dizziness or chest pain. She did take her amlodipine this morning but did not take her hydrochlorothiazide. New BP medication ordered by PCP earlier this week but she has not started it. Again, reiterated the importance of taking medication as prescribed. Verbalized understanding. She has a follow-up appointment scheduled with her PCP in March.  All study procedures completed per protocol. Study IP dispensed. She will return in May for her next study visit.

## 2023-10-24 ENCOUNTER — Other Ambulatory Visit: Payer: Self-pay | Admitting: Student

## 2023-10-24 ENCOUNTER — Telehealth: Payer: Self-pay | Admitting: Dietician

## 2023-10-24 DIAGNOSIS — E119 Type 2 diabetes mellitus without complications: Secondary | ICD-10-CM

## 2023-10-24 MED ORDER — TIRZEPATIDE 2.5 MG/0.5ML ~~LOC~~ SOAJ: 2.5000 mg | SUBCUTANEOUS | 1 refills | Status: DC

## 2023-10-24 NOTE — Telephone Encounter (Signed)
 Tried calling per Dr. Geraldo Pitter to discuss starting Tirzepatide.Mailbox was full, unable to leave a message.

## 2023-10-24 NOTE — Progress Notes (Signed)
 Called and discussed normal BMP and A1c newly in the diabetic range at 6.5.  Discussed lifestyle modifications versus medication.  She has been trying lifestyle modifications with improvement in diet, decrease sugar intake, and exercise.  With her comorbid class III obesity she would benefit from a GLP-1 agonist.  It does appear that tirzepatide may be covered on her insurance with the preauthorization so we will try to send this in and start at 2.5 mg weekly.  Risks, benefits, and alternatives discussed.

## 2023-10-24 NOTE — Telephone Encounter (Signed)
 Yes

## 2023-10-24 NOTE — Telephone Encounter (Signed)
 Patient called back. She stated she does not have the meidicne yet and is unsure when she might get it. I explained how a PA works and that she may not get it for 1-3 days.  I also briefly explained how to use the tirzepatide pen and how to eat to prevent side effects with it. She repeated back most of the information to acknowledge understanding. She was encouraged to call the office if she gets the pen and wants help learning how to use it.

## 2023-10-27 NOTE — Progress Notes (Signed)
 Internal Medicine Clinic Attending  Case discussed with the resident at the time of the visit.  We reviewed the resident's history and exam and pertinent patient test results.  I agree with the assessment, diagnosis, and plan of care documented in the resident's note.

## 2023-10-28 ENCOUNTER — Telehealth: Payer: Self-pay

## 2023-10-28 ENCOUNTER — Other Ambulatory Visit (HOSPITAL_COMMUNITY): Payer: Self-pay

## 2023-10-28 DIAGNOSIS — E119 Type 2 diabetes mellitus without complications: Secondary | ICD-10-CM

## 2023-10-28 NOTE — Telephone Encounter (Signed)
 Pharmacy Patient Advocate Encounter   Received notification from CoverMyMeds that prior authorization for [facility] is required/requested.   Insurance verification completed.   The patient is insured through [facility] .   Insurance has last name CLYBRUN  PA required; PA submitted to above mentioned insurance via CoverMyMeds Key/confirmation #/EOC [id number]. Status is pending

## 2023-10-29 NOTE — Telephone Encounter (Signed)
 Pharmacy Patient Advocate Encounter  Received notification from [facility] that Prior Authorization for [facility] has been DENIED.  Full denial letter will be uploaded to the media tab. See denial reason below.    PA #/Case ID/Reference #: [id number].

## 2023-10-31 MED ORDER — METFORMIN HCL 500 MG PO TABS: ORAL_TABLET | ORAL | 0 refills | Status: DC

## 2023-10-31 NOTE — Addendum Note (Signed)
 Addended by: Rocky Morel on: 10/31/2023 09:28 AM   Modules accepted: Orders

## 2023-11-13 ENCOUNTER — Encounter: Payer: BLUE CROSS/BLUE SHIELD | Admitting: Internal Medicine

## 2023-11-13 NOTE — Progress Notes (Deleted)
 Subjective:  CC: diabetes, HTN  HPI:  [name] is a 53 y.o. female with a past medical history of history of ASCUS, HTN, HIV with lipodystrophy who presents today for diabetes and HTN.  She was last seen 2/19 at which time she was diagnosed with diabetes. Unfortunately her insurance denied Mounjaro. Her blood pressure was elevated at that office visit and olmesartan was added to her regmimen with amlodipine and HCTA.    Please see problem based assessment and plan for additional details.  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   Lower extremity edema 01/12/2015   Lung abscess (HCC)    Morbid obesity (HCC)    Pneumonia    TB (tuberculosis)    Tonsillitis    Vaccine counseling 06/06/2022   Viral URI 09/16/2017   Weight gain 10/27/2018    MEDICATIONS:  Amlodipine 10 mg Fluticasone  Atorvastatin 20 mg Olmesartan 20 mg Hydrochlorothiazide 25 mg Metformin  Asa 81 mg  Mounjaro denied  Family History  Problem Relation Age of Onset   Hypertension Mother    Hypertension Father    Diabetes Sister    Hypertension Sister     Past Surgical History:  Procedure Laterality Date   CESAREAN SECTION     VIDEO BRONCHOSCOPY Bilateral 04/13/2013   Procedure: VIDEO BRONCHOSCOPY WITHOUT FLUORO;  Surgeon: Kalman Shan, MD;  Location: [facility] ENDOSCOPY;  Service: Cardiopulmonary;  Laterality: Bilateral;     Social History   Socioeconomic History   Marital status: Single    Spouse name: Not on file   Number of children: Not on file   Years of education: Not on file   Highest education level: Not on file  Occupational History   Not on file  Tobacco Use   Smoking status: Former    Current packs/day:  0.00    Types: Cigarettes    Quit date: 12/22/2013    Years since quitting: 9.8   Smokeless tobacco: Never  Vaping Use   Vaping status: Never Used  Substance and Sexual Activity   Alcohol use: No    Alcohol/week: 0.0 standard drinks of alcohol   Drug use: No   Sexual activity: Not Currently    Partners: Male    Birth control/protection: None  Other Topics Concern   Not on file  Social History Narrative   Not on file   Social Drivers of Health   Financial Resource Strain: Not on file  Food Insecurity: Not on file  Transportation Needs: Not on file  Physical Activity: Not on file  Stress: Not on file  Social Connections: Not on file  Intimate Partner Violence: Not on file    Review of Systems: ROS negative except for what is noted on the assessment and plan.  Objective:  There were no vitals filed for this visit.  Physical Exam: Constitutional: well-appearing *** sitting in ***, in no acute distress HENT: normocephalic atraumatic, mucous membranes moist Eyes: conjunctiva non-erythematous Neck: supple Cardiovascular: regular rate and rhythm, no m/r/g Pulmonary/Chest: normal work of breathing on room air, lungs clear to auscultation bilaterally Abdominal: soft, non-tender, non-distended MSK: normal bulk and tone Neurological: alert & oriented x 3, 5/5 strength in bilateral upper and lower extremities, normal gait Skin: warm  and dry Psych: ***     Assessment & Plan:  No problem-specific Assessment & Plan notes found for this encounter.    Patient discussed with Dr. Laney Pastor Drevion Offord, D.O. [facility] Internal Medicine  PGY-3 Pager: [phone]  Phone: [phone] Date 11/13/2023  Time 8:55 AM

## 2023-11-18 ENCOUNTER — Ambulatory Visit (INDEPENDENT_AMBULATORY_CARE_PROVIDER_SITE_OTHER): Payer: Self-pay | Admitting: Internal Medicine

## 2023-11-18 VITALS — BP 149/95 | HR 79 | Temp 98.5°F | Ht 61.0 in | Wt 251.7 lb

## 2023-11-18 DIAGNOSIS — I10 Essential (primary) hypertension: Secondary | ICD-10-CM | POA: Diagnosis not present

## 2023-11-18 DIAGNOSIS — J45901 Unspecified asthma with (acute) exacerbation: Secondary | ICD-10-CM

## 2023-11-18 DIAGNOSIS — Z1231 Encounter for screening mammogram for malignant neoplasm of breast: Secondary | ICD-10-CM

## 2023-11-18 DIAGNOSIS — J4531 Mild persistent asthma with (acute) exacerbation: Secondary | ICD-10-CM

## 2023-11-18 DIAGNOSIS — E119 Type 2 diabetes mellitus without complications: Secondary | ICD-10-CM | POA: Diagnosis not present

## 2023-11-18 DIAGNOSIS — J4530 Mild persistent asthma, uncomplicated: Secondary | ICD-10-CM

## 2023-11-18 MED ORDER — PREDNISONE 50 MG PO TABS: 50.0000 mg | ORAL_TABLET | Freq: Every day | ORAL | 0 refills | Status: DC

## 2023-11-18 MED ORDER — HYDROCHLOROTHIAZIDE 25 MG PO TABS: 25.0000 mg | ORAL_TABLET | Freq: Every day | ORAL | 3 refills | Status: DC

## 2023-11-18 MED ORDER — TRULICITY 0.75 MG/0.5ML ~~LOC~~ SOAJ: 0.7500 mg | SUBCUTANEOUS | 3 refills | Status: DC

## 2023-11-18 MED ORDER — OLMESARTAN MEDOXOMIL 20 MG PO TABS: 20.0000 mg | ORAL_TABLET | Freq: Every day | ORAL | 3 refills | Status: DC

## 2023-11-18 MED ORDER — ALBUTEROL SULFATE HFA 108 (90 BASE) MCG/ACT IN AERS: 1.0000 | INHALATION_SPRAY | Freq: Four times a day (QID) | RESPIRATORY_TRACT | 5 refills | Status: DC | PRN

## 2023-11-18 NOTE — Progress Notes (Signed)
 Subjective:  CC: cough  HPI:  Monique [name] is a 53 y.o. female with a past medical history of HIV with lipodystrophy, ascus with HPV, HTN, HLD who presents today for cough and wheezing.  She was seen in urgent care in mid February for shortness of breath and wheezing.  At that visit chest x-ray showed no signs concerning for pneumonia.  She was discharged with steroid pack.  She followed up in internal medicine clinic later February and continued to have coughing.  Zyrtec and Flonase were added.  Since then she is used albuterol 2-3 times daily and continues to feel very short of breath.  She was diagnosed with asthma a long time ago but is unclear on how she got the diagnosis originally.  Prior to a viral infection in February she had not used inhalers regularly.  She was also diagnosed with diabetes at office visit in February with new A1c at 6.5.  At that time GLP-1 with Greggory Keen was sent in but unfortunately insurance denied this.  February she was also found to have very elevated blood pressures at her infectious disease clinic appointment.  She was restarted on amlodipine, olmesartan, and HCTZ.Marland Kitchen  Please see problem based assessment and plan for additional details.  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 History of exertional chest  pain  Family History  Problem Relation Age of Onset   Hypertension Mother    Hypertension Father    Diabetes Sister    Hypertension Sister     Past Surgical History:  Procedure Laterality Date   CESAREAN SECTION     VIDEO BRONCHOSCOPY Bilateral 04/13/2013   Procedure: VIDEO BRONCHOSCOPY WITHOUT FLUORO;  Surgeon: Kalman Shan, MD;  Location: [facility] ENDOSCOPY;  Service: Cardiopulmonary;  Laterality: Bilateral;     Social History   Socioeconomic History   Marital status: Single    Spouse name: Not on file   Number of children: Not on file   Years of education: Not on file   Highest education level: Not on file  Occupational History   Not on file  Tobacco Use   Smoking status: Former    Current packs/day: 0.00    Types: Cigarettes    Quit date: 12/22/2013    Years since quitting: 9.9   Smokeless tobacco: Never  Vaping Use   Vaping status: Never Used  Substance and Sexual Activity   Alcohol use: No    Alcohol/week: 0.0 standard drinks of alcohol   Drug use: No   Sexual activity: Not Currently    Partners: Male    Birth control/protection: None  Other Topics Concern   Not on file  Social History Narrative   Not on file   Social Drivers of Health   Financial Resource Strain: Not on file  Food Insecurity: Not on file  Transportation Needs: Not on file  Physical Activity: Not on file  Stress: Not on file  Social Connections: Not on file  Intimate Partner Violence: Not on file    Review of Systems: ROS negative except for what is noted on the assessment and plan.  Objective:   Vitals:   11/18/23 1600 11/18/23 1625  BP: (!) 156/95 (!) 149/95  Pulse: 86 79  Temp: 98.5 F (36.9 C)   TempSrc: Oral   SpO2: 95%   Weight: 251 lb 11.2 oz (114.2 kg)   Height: 5\' 1"  (1.549 m)     Physical Exam: Constitutional: well-appearing, in no acute distress Cardiovascular: regular rate and rhythm, no m/r/g Pulmonary/Chest: normal work of breathing on room air, lungs with  diffuse expiratory wheezing throughout lung field bilaterally MSK: no lower extremity edema Neurological: alert & oriented x 3,normal gait Skin: warm and dry   Assessment & Plan:  Asthma She went to the urgent care on February 17 with a viral upper respiratory infection.  Workup was reassuring and she was discharged with a steroid pack.  She followed up in clinic 2 days later and continued to have significant coughing.  She was started on Zyrtec and Flonase.  Since then she has continued to have wheezing and is using albuterol inhaler 2-3 times daily without improvement in symptoms. On exam she does have diffuse wheezing bilaterally.  Her oxygen saturations than normal limits and she is not using any accessory muscles to breathe. A: mild asthma exacerbation P: Prednisone 50 mg for 5 days sent.  Refill on albuterol inhaler.  I talked with patient about return precautions if dyspnea worsens that she would need to go to an urgent care or emergency room.  She has follow-up with pulmonology on April 1 with Dr. Kendrick Fries at [facility].  I encouraged her to attend this appointment.  I think she would benefit from pulmonary function test when she is out of an acute exacerbation.  She should also be started on low-dose ICS such as fluticasone  Essential hypertension Home medications include amlodipine 10 mg daily, hydrochlorothiazide 25 mg daily and olmesartan 20 mg.  The last 3 to 4 days she is only had amlodipine at home.  Her blood pressure is elevated with systolics consistently over 140s.   P: Refills of hydrochlorothiazide 25 mg and olmesartan sent to pharmacy Continue amlodipine 10 mg daily Follow-up in 1 month, would repeat BMP at that visit   Patient discussed with Dr. Marjorie Smolder Kevis Qu, D.O. [facility] Internal Medicine  PGY-3 Pager: [phone]  Phone: [phone] Date 11/18/2023  Time 5:12 PM

## 2023-11-18 NOTE — Patient Instructions (Signed)
 Thank you, [name] for allowing [place] to provide your care today.   Wheezing I am sending in a day course of steroids and an albuterol inhaler. Please pick those up. If symptoms do not improve then call use back or go to urgent care. Be sure to follow-up with pulmonology next week.  Diabetes I am sending in trulicity which is a once weekly injectable medication. Follow-up in 2 months  Blood pressure Be sure to check your blood pressures! I have sent in refills of hydrochlorothiazide, olmesartan and amlodipine.  I have ordered the following medication/changed the following medications:   Stop the following medications: Medications Discontinued During This Encounter  Medication Reason   hydrochlorothiazide (HYDRODIURIL) 25 MG tablet Reorder   olmesartan (BENICAR) 20 MG tablet Reorder   albuterol (VENTOLIN HFA) 108 (90 Base) MCG/ACT inhaler Reorder     Start the following medications: Meds ordered this encounter  Medications   olmesartan (BENICAR) 20 MG tablet    Sig: Take 1 tablet (20 mg total) by mouth daily.    Dispense:  90 tablet    Refill:  3   hydrochlorothiazide (HYDRODIURIL) 25 MG tablet    Sig: Take 1 tablet (25 mg total) by mouth daily.    Dispense:  90 tablet    Refill:  3   albuterol (VENTOLIN HFA) 108 (90 Base) MCG/ACT inhaler    Sig: Inhale 1-2 puffs into the lungs every 6 (six) hours as needed for wheezing or shortness of breath.    Dispense:  18 g    Refill:  5   predniSONE (DELTASONE) 50 MG tablet    Sig: Take 1 tablet (50 mg total) by mouth daily with breakfast.    Dispense:  5 tablet    Refill:  0   Dulaglutide (TRULICITY) 0.75 MG/0.5ML SOAJ    Sig: Inject 0.75 mg into the skin once a week.    Dispense:  2 mL    Refill:  3     Follow up:  2 months    We look forward to seeing you next time. Please call our clinic at [phone] if you have any questions or concerns. The best time to call is Monday-Friday from 9am-4pm, but there is someone  available 24/7. If after hours or the weekend, call the main hospital number and ask for the Internal Medicine Resident On-Call. If you need medication refills, please notify your pharmacy one week in advance and they will send [place] a request.   Thank you for trusting me with your care. Wishing you the best!   Rudene Christians, DO [facility]

## 2023-11-18 NOTE — Assessment & Plan Note (Addendum)
 She went to the urgent care on February 17 with a viral upper respiratory infection.  Workup was reassuring and she was discharged with a steroid pack.  She followed up in clinic 2 days later and continued to have significant coughing.  She was started on Zyrtec and Flonase.  Since then she has continued to have wheezing and is using albuterol inhaler 2-3 times daily without improvement in symptoms. On exam she does have diffuse wheezing bilaterally.  Her oxygen saturations than normal limits and she is not using any accessory muscles to breathe. A: mild asthma exacerbation P: Prednisone 50 mg for 5 days sent.  Refill on albuterol inhaler.  I talked with patient about return precautions if dyspnea worsens that she would need to go to an urgent care or emergency room.  She has follow-up with pulmonology on April 1 with Dr. Kendrick Fries at [facility].  I encouraged her to attend this appointment.  I think she would benefit from pulmonary function test when she is out of an acute exacerbation.  She should also be started on low-dose ICS such as fluticasone

## 2023-11-18 NOTE — Assessment & Plan Note (Addendum)
 Home medications include amlodipine 10 mg daily, hydrochlorothiazide 25 mg daily and olmesartan 20 mg.  The last 3 to 4 days she is only had amlodipine at home.  Her blood pressure is elevated with systolics consistently over 140s.   P: Refills of hydrochlorothiazide 25 mg and olmesartan sent to pharmacy Continue amlodipine 10 mg daily Follow-up in 1 month, would repeat BMP at that visit

## 2023-11-24 ENCOUNTER — Ambulatory Visit (HOSPITAL_COMMUNITY): Admission: EM | Admit: 2023-11-24 | Discharge: 2023-11-24 | Disposition: A | Discharge status: Home / Self Care

## 2023-11-24 ENCOUNTER — Other Ambulatory Visit: Payer: Self-pay

## 2023-11-24 ENCOUNTER — Encounter (HOSPITAL_COMMUNITY): Payer: Self-pay | Admitting: *Deleted

## 2023-11-24 DIAGNOSIS — A084 Viral intestinal infection, unspecified: Secondary | ICD-10-CM | POA: Diagnosis not present

## 2023-11-24 LAB — POC COVID19/FLU A&B COMBO
Covid Antigen, POC: NEGATIVE
Influenza A Antigen, POC: NEGATIVE
Influenza B Antigen, POC: NEGATIVE

## 2023-11-24 MED ORDER — LOPERAMIDE HCL 2 MG PO CAPS: 4.0000 mg | ORAL_CAPSULE | Freq: Four times a day (QID) | ORAL | 0 refills | Status: DC | PRN

## 2023-11-24 MED ORDER — ONDANSETRON 4 MG PO TBDP: 4.0000 mg | ORAL_TABLET | Freq: Three times a day (TID) | ORAL | 0 refills | Status: DC | PRN

## 2023-11-24 NOTE — ED Provider Notes (Signed)
 [facility]  Note:  This document was prepared using Dragon voice recognition software and may include unintentional dictation errors.  MRN: [id number] DOB: 04-Jan-1971  Subjective:   Monique [name] is a 53 y.o. female presenting for diarrhea, chills, body aches since this morning at 4 AM.  She reports some mild nausea but no vomiting.  Reports that her grandson was at her house and had similar symptoms with diarrhea and vomiting.  Patient was concerned she might have the flu.  Patient also concern for dehydration.  Denies taking any over-the-counter medication to treat symptoms.  No current facility-administered medications for this encounter.  Current Outpatient Medications:    loperamide (IMODIUM) 2 MG capsule, Take 2 capsules (4 mg total) by mouth 4 (four) times daily as needed for diarrhea or loose stools., Disp: 16 capsule, Rfl: 0   ondansetron (ZOFRAN-ODT) 4 MG disintegrating tablet, Take 1 tablet (4 mg total) by mouth every 8 (eight) hours as needed for nausea or vomiting., Disp: 20 tablet, Rfl: 0   albuterol (VENTOLIN HFA) 108 (90 Base) MCG/ACT inhaler, Inhale 1-2 puffs into the lungs every 6 (six) hours as needed for wheezing or shortness of breath., Disp: 18 g, Rfl: 5   amLODipine (NORVASC) 10 MG tablet, TAKE 1 TABLET(10 MG) BY MOUTH DAILY, Disp: 30 tablet, Rfl: 11   aspirin EC 81 MG tablet, Take 1 tablet (81 mg total) by mouth daily. Swallow whole., Disp: 30 tablet, Rfl: 2   atorvastatin (LIPITOR) 20 MG tablet, Take 1 tablet (20 mg total) by mouth daily., Disp: 30 tablet, Rfl: 11   cetirizine (ZYRTEC ALLERGY) 10 MG tablet, Take 1 tablet (10 mg total) by mouth daily., Disp: 30 tablet, Rfl: 2   Dulaglutide (TRULICITY) 0.75 MG/0.5ML SOAJ, Inject 0.75 mg into the skin once a week., Disp: 2 mL, Rfl: 3   fluticasone (FLONASE) 50 MCG/ACT nasal spray, Place 2 sprays into both nostrils daily., Disp: 1 g, Rfl: 0   hydrochlorothiazide (HYDRODIURIL) 25 MG tablet, Take 1 tablet  (25 mg total) by mouth daily., Disp: 90 tablet, Rfl: 3   ibuprofen (ADVIL) 800 MG tablet, Take 1 tablet (800 mg total) by mouth 3 (three) times daily with meals., Disp: 6 tablet, Rfl: 0   metFORMIN (GLUCOPHAGE) 500 MG tablet, Take 1 tablet (500 mg total) by mouth daily with breakfast for 7 days, THEN 1 tablet (500 mg total) 2 (two) times daily with a meal for 21 days., Disp: 49 tablet, Rfl: 0   olmesartan (BENICAR) 20 MG tablet, Take 1 tablet (20 mg total) by mouth daily., Disp: 90 tablet, Rfl: 3   predniSONE (DELTASONE) 50 MG tablet, Take 1 tablet (50 mg total) by mouth daily with breakfast., Disp: 5 tablet, Rfl: 0   Study - [id number] - bictegravir/emtrictabine/tenofovir alafenamide (BIKTARVY) 50-200-25 mg or placebo tablet ([facility]), Take 1 tablet by mouth daily., Disp: 105 tablet, Rfl: 0   Study - [id number] - MK-[id number] 100/0.25 mg or placebo tablet ([facility]), Take 1 tablet by mouth daily., Disp: 105 tablet, Rfl: 0   No Known Allergies  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   Lower extremity edema 01/12/2015   Lung abscess (HCC)    Morbid obesity (HCC)    Pneumonia  TB (tuberculosis)    Tonsillitis    Vaccine counseling 06/06/2022   Viral URI 09/16/2017   Weight gain 10/27/2018     Past Surgical History:  Procedure Laterality Date   CESAREAN SECTION     VIDEO BRONCHOSCOPY Bilateral 04/13/2013   Procedure: VIDEO BRONCHOSCOPY WITHOUT FLUORO;  Surgeon: Kalman Shan, MD;  Location: [facility] END[facility]COPY;  Service: Cardiopulmonary;  Laterality: Bilateral;    Family History  Problem Relation Age of Onset   Hypertension Mother    Hypertension Father    Diabetes Sister    Hypertension Sister     Social  History   Tobacco Use   Smoking status: Former    Current packs/day: 0.00    Types: Cigarettes    Quit date: 12/22/2013    Years since quitting: 9.9   Smokeless tobacco: Never  Vaping Use   Vaping status: Never Used  Substance Use Topics   Alcohol use: No    Alcohol/week: 0.0 standard drinks of alcohol   Drug use: No    ROS Refer to HPI for ROS details.  Objective:   Vitals: BP 138/89   Pulse (!) 104   Temp 100.2 F (37.9 C)   Resp (!) 22   LMP 10/25/2023 (Approximate)   SpO2 94%   Physical Exam Vitals and nursing note reviewed.  Constitutional:      General: She is not in acute distress.    Appearance: Normal appearance. She is well-developed. She is not ill-appearing or toxic-appearing.  HENT:     Head: Normocephalic.  Cardiovascular:     Rate and Rhythm: Normal rate.  Pulmonary:     Effort: Pulmonary effort is normal. No respiratory distress.  Abdominal:     General: There is no distension.     Palpations: Abdomen is soft.     Tenderness: There is no abdominal tenderness. There is no guarding or rebound.  Musculoskeletal:     Cervical back: Neck supple.  Skin:    General: Skin is warm and dry.     Capillary Refill: Capillary refill takes less than 2 seconds.  Neurological:     General: No focal deficit present.     Mental Status: She is alert and oriented to person, place, and time.  Psychiatric:        Mood and Affect: Mood normal.     Procedures  Results for orders placed or performed during the hospital encounter of 11/24/23 (from the past 24 hours)  POC Covid19/Flu A&B Antigen     Status: None   Collection Time: 11/24/23 11:29 AM  Result Value Ref Range   Influenza A Antigen, POC Negative Negative   Influenza B Antigen, POC Negative Negative   Covid Antigen, POC Negative Negative    Assessment and Plan :   PDMP not reviewed this encounter.  1. Viral gastroenteritis    1. Viral gastroenteritis (Primary) - POC Covid19/Flu A&B Antigen  completed in UC is negative for influenza and COVID. - ondansetron (ZOFRAN-ODT) 4 MG disintegrating tablet; Take 1 tablet (4 mg total) by mouth every 8 (eight) hours as needed for nausea or vomiting.  Dispense: 20 tablet; Refill: 0 - loperamide (IMODIUM) 2 MG capsule; Take 2 capsules (4 mg total) by mouth 4 (four) times daily as needed for diarrhea or loose stools.  Dispense: 16 capsule; Refill: 0 -Drink plenty of fluids, get plenty of rest and take medications as directed for symptoms of viral gastroenteritis. -Continue to monitor symptoms for any change in severity if there is any  escalation of current symptoms or development of new symptoms follow-up in ER for further evaluation and management.  Lucky Cowboy   [place], [place], [place] 11/24/23 1201

## 2023-11-24 NOTE — Discharge Instructions (Addendum)
 1. Viral gastroenteritis (Primary) - POC Covid19/Flu A&B Antigen completed in UC is negative for influenza and COVID. - ondansetron (ZOFRAN-ODT) 4 MG disintegrating tablet; Take 1 tablet (4 mg total) by mouth every 8 (eight) hours as needed for nausea or vomiting.  Dispense: 20 tablet; Refill: 0 - loperamide (IMODIUM) 2 MG capsule; Take 2 capsules (4 mg total) by mouth 4 (four) times daily as needed for diarrhea or loose stools.  Dispense: 16 capsule; Refill: 0 -Drink plenty of fluids, get plenty of rest and take medications as directed for symptoms of viral gastroenteritis. -Continue to monitor symptoms for any change in severity if there is any escalation of current symptoms or development of new symptoms follow-up in ER for further evaluation and management.

## 2023-11-24 NOTE — ED Triage Notes (Signed)
 PT reports waking up at 0400 with diarrhea ,chills and feels horrible.

## 2023-11-26 ENCOUNTER — Encounter: Payer: Self-pay | Admitting: Infectious Disease

## 2023-11-26 ENCOUNTER — Telehealth: Payer: Self-pay | Admitting: Infectious Disease

## 2023-11-26 DIAGNOSIS — K529 Noninfective gastroenteritis and colitis, unspecified: Secondary | ICD-10-CM | POA: Insufficient documentation

## 2023-11-26 HISTORY — DX: Noninfective gastroenteritis and colitis, unspecified: K52.9

## 2023-11-26 NOTE — Telephone Encounter (Signed)
 Noted Monique [name] in ER on [phone] with viral gastroenteritis  I dont want to risk causing an outbeak of something like norovirus in clinic so I would prefer for her visit to be rescheduled vs virtual

## 2023-11-27 ENCOUNTER — Ambulatory Visit: Payer: Self-pay | Admitting: Infectious Disease

## 2023-11-27 ENCOUNTER — Ambulatory Visit: Payer: Self-pay

## 2023-11-27 DIAGNOSIS — E782 Mixed hyperlipidemia: Secondary | ICD-10-CM

## 2023-11-27 DIAGNOSIS — K529 Noninfective gastroenteritis and colitis, unspecified: Secondary | ICD-10-CM

## 2023-11-27 DIAGNOSIS — B20 Human immunodeficiency virus [HIV] disease: Secondary | ICD-10-CM

## 2023-11-27 DIAGNOSIS — Z7185 Encounter for immunization safety counseling: Secondary | ICD-10-CM

## 2023-11-27 DIAGNOSIS — I10 Essential (primary) hypertension: Secondary | ICD-10-CM

## 2023-11-27 NOTE — Progress Notes (Signed)
 Internal Medicine Clinic Attending  Case discussed with the resident at the time of the visit.  We reviewed the resident's history and exam and pertinent patient test results.  I agree with the assessment, diagnosis, and plan of care documented in the resident's note.

## 2023-11-27 NOTE — Telephone Encounter (Signed)
 Chief Complaint: Diarrhea, stomach soreness Symptoms: see above Frequency: since 03/30 Pertinent Negatives: Patient denies fever, blood in stool, vomiting Disposition: [] ED /[] Urgent Care (no appt availability in office) / [] Appointment(In office/virtual)/ []  [facility]/ [x] Home Care/ [] Refused Recommended Disposition /[] [facility]/ [x]  Follow-up with PCP Additional Notes: Patient contacted PCP to state she is still having the same symptoms of diarrhea and abdominal soreness with no improvement after her visit to UC for Gastroenteritis. Patient was given zofran and immodium. When asked if patient had been taking both as prescribed, patient did not realize she had picked up immodium from the pharmacy. Advised patient to begin immodium as prescribed by physician at UC, and to increase fluid intake. Patient verbalized understanding. Patient states yesterday she felt slightly lightheaded but today she is feeling okay, and is continuing to take in fluids. Patient is asking if her PCP can provide her a doctor's note for this evening due to being ill and unable to stop bowel movements. Please advise and contact patient.    Copied from CRM [id number]. Topic: Clinical - Red Word Triage >> Nov 27, 2023  9:03 AM Philippa Chester F wrote: [facility] that prompted transfer to Nurse Triage:  Stomach feels sore( pain); diarrhea Reason for Disposition  SEVERE diarrhea (e.g., 7 or more times / day more than normal)    Please see notes  Answer Assessment - Initial Assessment Questions 1. DIARRHEA SEVERITY: "How bad is the diarrhea?" "How many more stools have you had in the past 24 hours than normal?"    - NO DIARRHEA (SCALE 0)   - MILD (SCALE 1-3): Few loose or mushy BMs; increase of 1-3 stools over normal daily number of stools; mild increase in ostomy output.   -  MODERATE (SCALE 4-7): Increase of 4-6 stools daily over normal; moderate increase in ostomy output.   -  SEVERE (SCALE 8-10; OR "WORST  POSSIBLE"): Increase of 7 or more stools daily over normal; moderate increase in ostomy output; incontinence.     6 2. ONSET: "When did the diarrhea begin?"      03/30 3. BM CONSISTENCY: "How loose or watery is the diarrhea?"      Watery 4. VOMITING: "Are you also vomiting?" If Yes, ask: "How many times in the past 24 hours?"      No 5. ABDOMEN PAIN: "Are you having any abdomen pain?" If Yes, ask: "What does it feel like?" (e.g., crampy, dull, intermittent, constant)      Stomach soreness 6. ABDOMEN PAIN SEVERITY: If present, ask: "How bad is the pain?"  (e.g., Scale 1-10; mild, moderate, or severe)   - MILD (1-3): doesn't interfere with normal activities, abdomen soft and not tender to touch    - MODERATE (4-7): interferes with normal activities or awakens from sleep, abdomen tender to touch    - SEVERE (8-10): excruciating pain, doubled over, unable to do any normal activities       Constant cough soreness type feeling 7. ORAL INTAKE: If vomiting, "Have you been able to drink liquids?" "How much liquids have you had in the past 24 hours?"     Patient is able to take in fluids 8. HYDRATION: "Any signs of dehydration?" (e.g., dry mouth [not just dry lips], too weak to stand, dizziness, new weight loss) "When did you last urinate?"     Yesterday she was lightheaded but today patient states she feels okay 9. EXPOSURE: "Have you traveled to a foreign country recently?" "Have you been exposed  to anyone with diarrhea?" "Could you have eaten any food that was spoiled?"     No 10. ANTIBIOTIC USE: "Are you taking antibiotics now or have you taken antibiotics in the past 2 months?"       No 11. OTHER SYMPTOMS: "Do you have any other symptoms?" (e.g., fever, blood in stool)       No  Protocols used: Diarrhea-A-AH

## 2023-11-27 NOTE — Telephone Encounter (Signed)
 I called and spoke to the patient she stated she is having abdominal soreness and diarrhea, unfortunately we don't have any availability until next Wednesday. Patient stated she will try going back to the urgent care or the ED.

## 2023-11-29 ENCOUNTER — Encounter (HOSPITAL_COMMUNITY): Payer: Self-pay

## 2023-11-29 ENCOUNTER — Ambulatory Visit (HOSPITAL_COMMUNITY)
Admission: EM | Admit: 2023-11-29 | Discharge: 2023-11-29 | Disposition: A | Discharge status: Home / Self Care | Payer: Self-pay | Attending: Emergency Medicine | Admitting: Emergency Medicine

## 2023-11-29 DIAGNOSIS — R1013 Epigastric pain: Secondary | ICD-10-CM

## 2023-11-29 DIAGNOSIS — R197 Diarrhea, unspecified: Secondary | ICD-10-CM

## 2023-11-29 MED ORDER — ALUM & MAG HYDROXIDE-SIMETH 200-200-20 MG/5ML PO SUSP
30.0000 mL | Freq: Once | ORAL | Status: AC
  Administered 2023-11-29: 30 mL via ORAL

## 2023-11-29 MED ORDER — LIDOCAINE VISCOUS HCL 2 % MT SOLN
15.0000 mL | Freq: Once | OROMUCOSAL | Status: AC
  Administered 2023-11-29: 15 mL via OROMUCOSAL

## 2023-11-29 MED ORDER — ALUM & MAG HYDROXIDE-SIMETH 200-200-20 MG/5ML PO SUSP
ORAL | Status: AC
  Filled 2023-11-29: qty 30

## 2023-11-29 MED ORDER — LIDOCAINE VISCOUS HCL 2 % MT SOLN
OROMUCOSAL | Status: AC
  Filled 2023-11-29: qty 15

## 2023-11-29 NOTE — ED Provider Notes (Signed)
 [facility]    CSN: [id number] Arrival date & time: 11/29/23  1225      History   Chief Complaint Chief Complaint  Patient presents with   Abdominal Pain    HPI Monique [name] is a 53 y.o. female.   Patient presents to clinic for concerns of ongoing diarrhea.  Diarrhea has been present since last Saturday, but is drastically improving.  She has only had 1 episode of loose stools today.  She has been taking the Imodium as prescribed from her visit 5 days ago.  Last night she took 2 pills and noticed that she felt bloated and had some indigestion.  She took Tums and this helped.  She is not having any chest pain or shortness of breath.  Denies fevers.  Without nausea or vomiting.  The history is provided by the patient and medical records.  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   Lower extremity edema 01/12/2015   Lung abscess (HCC)    Morbid obesity (HCC)    Pneumonia    TB (tuberculosis)    Tonsillitis    Vaccine counseling 06/06/2022   Viral URI 09/16/2017   Weight gain 10/27/2018    Patient Active Problem List   Diagnosis Date Noted   Gastroenteritis 11/26/2023   Diabetes (HCC) 10/16/2023   Vaccine counseling 06/06/2022   ASCUS with positive high risk HPV cervical 07/10/2021   Asthma 07/10/2021   Finger numbness 07/10/2021   Hyperlipidemia 07/06/2021   Screening for cervical cancer 04/24/2020   Encounter for screening mammogram for malignant neoplasm of breast 04/24/2020   Morbid obesity (HCC)    Seasonal allergies 01/26/2019   Healthcare maintenance 05/19/2018   Upper respiratory tract infection 09/16/2017   Exertional chest pain  05/13/2017   Obese 08/10/2015   Essential hypertension 01/12/2015   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      Home Medications    Prior to Admission medications   Medication Sig Start Date End Date Taking? Authorizing Provider  albuterol (VENTOLIN HFA) 108 (90 Base) MCG/ACT inhaler Inhale 1-2 puffs into the lungs every 6 (six) hours as needed for wheezing or shortness of breath. 11/18/23  Yes Masters, Katie, DO  amLODipine (NORVASC) 10 MG tablet TAKE 1 TABLET(10 MG) BY MOUTH DAILY 10/08/23  Yes Daiva Eves, Lisette Grinder, MD  aspirin EC 81 MG tablet Take 1 tablet (81 mg total) by mouth daily. Swallow whole. 07/06/21  Yes Verdene Lennert, MD  atorvastatin (LIPITOR) 20 MG tablet Take 1 tablet (20 mg total) by mouth daily. 10/08/23 10/07/24 Yes Randall Hiss, MD  cetirizine (ZYRTEC ALLERGY) 10 MG tablet Take 1 tablet (10 mg total) by mouth daily. 12/24/19 11/29/23 Yes [place]field, Carley D, DO  fluticasone (FLONASE) 50 MCG/ACT nasal spray Place 2 sprays  into both nostrils daily. 07/12/22 11/29/23 Yes Tedrowe, Michelle A, DO  hydrochlorothiazide (HYDRODIURIL) 25 MG tablet Take 1 tablet (25 mg total) by mouth daily. 11/18/23  Yes Masters, Katie, DO  ibuprofen (ADVIL) 800 MG tablet Take 1 tablet (800 mg total) by mouth 3 (three) times daily with meals. 07/03/23  Yes Mardella Layman, MD  loperamide (IMODIUM) 2 MG capsule Take 2 capsules (4 mg total) by mouth 4 (four) times daily as needed for diarrhea or loose stools. 11/24/23  Yes Reddick, Johnathan B, NP  metFORMIN (GLUCOPHAGE) 500 MG  tablet Take 1 tablet (500 mg total) by mouth daily with breakfast for 7 days, THEN 1 tablet (500 mg total) 2 (two) times daily with a meal for 21 days. 10/31/23 11/29/23 Yes Rocky Morel, DO  olmesartan (BENICAR) 20 MG tablet Take 1 tablet (20 mg total) by mouth daily. 11/18/23  Yes Masters, Katie, DO  ondansetron (ZOFRAN-ODT) 4 MG disintegrating tablet Take 1 tablet (4 mg total) by mouth every 8 (eight) hours as needed for nausea or vomiting. 11/24/23  Yes Reddick, Johnathan B, NP  predniSONE (DELTASONE) 50 MG tablet Take 1 tablet (50 mg total) by mouth daily with breakfast. 11/18/23  Yes Masters, [place], DO  Study [phone] - bictegravir/emtrictabine/tenofovir alafenamide (BIKTARVY) 50-200-25 mg or placebo tablet ([facility]) Take 1 tablet by mouth daily. 10/18/23  Yes Daiva Eves, Lisette Grinder, MD  Study [phone] - [id number] 100/0.25 mg or placebo tablet ([facility]) Take 1 tablet by mouth daily. 10/18/23  Yes Daiva Eves, Lisette Grinder, MD  Dulaglutide (TRULICITY) 0.75 MG/0.5ML SOAJ Inject 0.75 mg into the skin once a week. 11/18/23   Masters, Florentina Addison, DO    Family History Family History  Problem Relation Age of Onset   Hypertension Mother    Hypertension Father    Diabetes Sister    Hypertension Sister     Social History Social History   Tobacco Use   Smoking status: Former    Current packs/day: 0.00    Types: Cigarettes    Quit date: 12/22/2013    Years since quitting: 9.9   Smokeless tobacco: Never  Vaping Use   Vaping status: Never Used  Substance Use Topics   Alcohol use: No    Alcohol/week: 0.0 standard drinks of alcohol   Drug use: No     Allergies   Patient has no known allergies.   Review of Systems Review of Systems  Per HPI  Physical Exam Triage Vital Signs ED Triage Vitals  Encounter Vitals Group     BP 11/29/23 1400 107/75     Systolic BP Percentile --      Diastolic BP Percentile --      Pulse Rate 11/29/23 1400 80     Resp 11/29/23 1400 18     Temp  11/29/23 1400 98.2 F (36.8 C)     Temp Source 11/29/23 1400 Oral     SpO2 11/29/23 1400 94 %     Weight 11/29/23 1400 250 lb (113.4 kg)     Height 11/29/23 1400 5\' 1"  (1.549 m)     Head Circumference --      Peak Flow --      Pain Score 11/29/23 1358 7     Pain Loc --      Pain Education --      Exclude from Growth Chart --    No data found.  Updated Vital Signs BP 107/75 (BP Location: Right Arm)   Pulse 80   Temp  98.2 F (36.8 C) (Oral)   Resp 18   Ht 5\' 1"  (1.549 m)   Wt 250 lb (113.4 kg)   LMP 11/22/2023 (Approximate)   SpO2 94%   BMI 47.24 kg/m   Visual Acuity Right Eye Distance:   Left Eye Distance:   Bilateral Distance:    Right Eye Near:   Left Eye Near:    Bilateral Near:     Physical Exam Vitals and nursing note reviewed.  Constitutional:      Appearance: She is well-developed.  HENT:     Head: Normocephalic and atraumatic.     Mouth/Throat:     Mouth: Mucous membranes are moist.  Cardiovascular:     Rate and Rhythm: Normal rate.  Pulmonary:     Effort: Pulmonary effort is normal. No respiratory distress.  Abdominal:     General: Abdomen is flat. Bowel sounds are normal.     Palpations: Abdomen is soft.     Tenderness: There is abdominal tenderness in the epigastric area. There is no guarding or rebound.     Hernia: No hernia is present.  Skin:    Capillary Refill: Capillary refill takes less than 2 seconds.  Neurological:     General: No focal deficit present.     Mental Status: She is alert.  Psychiatric:        Mood and Affect: Mood normal.      UC Treatments / Results  Labs (all labs ordered are listed, but only abnormal results are displayed) Labs Reviewed - No data to display  EKG   Radiology No results found.  Procedures Procedures (including critical care time)  Medications Ordered in UC Medications  alum & mag hydroxide-simeth (MAALOX/MYLANTA) 200-200-20 MG/5ML suspension 30 mL (30 mLs Oral Given 11/29/23 1430)   lidocaine (XYLOCAINE) 2 % viscous mouth solution 15 mL (15 mLs Mouth/Throat Given 11/29/23 1430)    Initial Impression / Assessment and Plan / UC Course  I have reviewed the triage vital signs and the nursing notes.  Pertinent labs & imaging results that were available during my care of the patient were reviewed by me and considered in my medical decision making (see chart for details).  Vitals in triage reviewed, patient is hemodynamically stable.  Abdomen is soft with active bowel sounds.  Mild epigastric tenderness, without rebound or guarding.  Low concern for acute abdomen at this time.  Patient has not taken any antibiotics within the past 6 months, low concern for C. difficile.  Overall diarrhea is improving.  GI cocktail given in clinic for indigestion.  Encourage PCP/emergency follow-up if symptoms evolve or persist.  Plan of care, follow-up care return precautions given, no questions at this time.  Work note provided.     Final Clinical Impressions(s) / UC Diagnoses   Final diagnoses:  Epigastric pain  Diarrhea, unspecified type     Discharge Instructions      It is reassuring that your diarrhea is improving.  Please follow a bland diet such as bananas, rice, toast and applesauce to help with this.  Ensure you are staying well-hydrated with at least 64 ounces of water daily.  We have given you a mix of 2 medications here in clinic to help with your indigestion, if this works well for you you can purchase Maalox over-the-counter, 10 to 20 mL up to 4 times daily as needed.  Since the Imodium caused some side effects, please stop taking this medication.  If you continue to have diarrhea into Monday  follow-up with your primary care provider.  If you develop any new or concerning symptoms please seek immediate care at the nearest emergency department for reevaluation.     ED Prescriptions   None    PDMP not reviewed this encounter.   Havilah Topor, [place], [place] 11/29/23  1435

## 2023-11-29 NOTE — ED Triage Notes (Signed)
 Chief Complaint: Abdominal pain and diarrhea. Was seen and treated for the same symptoms last week but the meds given cause increased heart burn.   Sick exposure: No  Onset: 1 week ago.  Prescriptions or OTC medications tried: Yes- Imodium     with mild relief  New foods, medications, or products: No  Recent Travel: No

## 2023-11-29 NOTE — Discharge Instructions (Addendum)
 It is reassuring that your diarrhea is improving.  Please follow a bland diet such as bananas, rice, toast and applesauce to help with this.  Ensure you are staying well-hydrated with at least 64 ounces of water daily.  We have given you a mix of 2 medications here in clinic to help with your indigestion, if this works well for you you can purchase Maalox over-the-counter, 10 to 20 mL up to 4 times daily as needed.  Since the Imodium caused some side effects, please stop taking this medication.  If you continue to have diarrhea into Monday follow-up with your primary care provider.  If you develop any new or concerning symptoms please seek immediate care at the nearest emergency department for reevaluation.

## 2023-12-04 ENCOUNTER — Ambulatory Visit

## 2024-01-08 ENCOUNTER — Ambulatory Visit: Admitting: Infectious Disease

## 2024-01-09 ENCOUNTER — Telehealth: Payer: Self-pay

## 2024-01-09 ENCOUNTER — Ambulatory Visit: Admitting: Infectious Disease

## 2024-01-09 NOTE — Telephone Encounter (Signed)
 Attempted to contact to reschedule [place] 01/09/24 unable to complete the call. Sent [employer].

## 2024-01-23 ENCOUNTER — Other Ambulatory Visit: Payer: Self-pay

## 2024-01-23 DIAGNOSIS — Z006 Encounter for examination for normal comparison and control in clinical research program: Secondary | ICD-10-CM

## 2024-01-23 MED ORDER — STUDY - MK-8591A-052 - MK-8591A 100/0.25 MG OR PLACEBO TABLET (PI-VAN DAM)
1.0000 | ORAL_TABLET | Freq: Every day | ORAL | 0 refills | Status: DC
Start: 2024-01-23 — End: 2024-04-02

## 2024-01-23 MED ORDER — STUDY - MK-8591A-052 - BICTEGRAVIR/EMTRICITABINE/TENOFOVIR ALAFENAMIDE (BIKTARVY) 50-200-25 MG OR PLACEBO TABLET (PI-VAN DAM): 1.0000 | ORAL_TABLET | Freq: Every day | ORAL | 0 refills | Status: DC

## 2024-01-23 NOTE — Progress Notes (Signed)
 The 10-year ASCVD risk score (Arnett DK, et al., 2019) is: 16.5%   Values used to calculate the score:     Age: 53 years     Sex: Female     Is Non-Hispanic African American: Yes     Diabetic: Yes     Tobacco smoker: No     Systolic Blood Pressure: 135 mmHg     Is BP treated: Yes     HDL Cholesterol: 40 mg/dL     Total Cholesterol: 195 mg/dL  Currently prescribed atorvastatin  20 mg.  Yigit Norkus, BSN, RN

## 2024-01-23 NOTE — Research (Signed)
 Dawnna seen today for her week 84 visit for [id number], A Phase 3, Randomized, Active-Controlled, Double-Blind Clinical Study to Evaluate a Switch to Doravirine/Islatravir (DOR/ISL 100 mg/0.25 mg) Once-Daily in Participants With HIV-1 Who Are Virologically Suppressed on Bictegravir/Emtricitabine /Tenofovir  Alafenamide (BIC/FTC/TAF). She did return study IP bottles BP was elevated today. Denied any headache, visual changes, dizziness or chest pain. She states she does not drink enough water, enc her to carry water with her and keep track of how much water she is drinking. Enc 40-60 oz of water daily. She did take her a BP medications and said having a pill box helps her with compliance. New IFC signed, declined copy. All study procedures completed per protocol. Study IP dispensed. She will return in August for her next study visit and DEXA scan the same day.Aaron Aas

## 2024-01-24 ENCOUNTER — Encounter: Payer: Self-pay | Admitting: Student

## 2024-01-24 ENCOUNTER — Ambulatory Visit: Payer: Self-pay | Admitting: Student

## 2024-01-24 ENCOUNTER — Other Ambulatory Visit: Payer: Self-pay

## 2024-01-24 VITALS — BP 149/84 | HR 88 | Temp 98.0°F | Ht 66.0 in | Wt 252.0 lb

## 2024-01-24 DIAGNOSIS — J069 Acute upper respiratory infection, unspecified: Secondary | ICD-10-CM

## 2024-01-24 NOTE — Assessment & Plan Note (Signed)
 Patient notes that she started feeling sick last night.  She notes her grandson has been sick for around the last week but tested negative for COVID and the flu.  She has endorsed myalgias, headache, sore throat, and some back pain.  She denies any chills, night sweats, nausea, or vomiting.  For her back pain, she has no radiation or other red flag signs/symptoms.  I suspect this is a upper respiratory tract infection given her sick contact and associated signs/symptoms.  Will treat supportively at this time. - Supportive treatment

## 2024-01-24 NOTE — Patient Instructions (Signed)
Thank you so much for coming to the clinic today!   If you have any questions please feel free to the call the clinic at anytime at [phone]. It was a pleasure seeing you!  Best, Dr. Rayvon Char

## 2024-01-24 NOTE — Progress Notes (Signed)
 CC: URI  HPI: [name] is a 53 y.o. female living with a history stated below and presents today for URI. Please see problem based assessment and plan for additional details.  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 18 g 5   amLODipine  (NORVASC ) 10 MG tablet TAKE 1 TABLET(10 MG) BY MOUTH DAILY 30 tablet 11   aspirin  EC 81 MG tablet Take 1 tablet (81 mg total) by mouth daily. Swallow whole. 30 tablet 2   atorvastatin  (LIPITOR) 20 MG tablet Take 1 tablet (20 mg total) by mouth daily. 30 tablet 11   cetirizine  (ZYRTEC  ALLERGY) 10 MG tablet Take 1 tablet (10 mg total) by mouth daily. 30 tablet 2   Dulaglutide (TRULICITY) 0.75 MG/0.5ML SOAJ Inject 0.75 mg into the skin once a week. 2 mL 3   fluticasone  (FLONASE ) 50 MCG/ACT nasal spray Place 2 sprays into both nostrils daily. 1 g 0   hydrochlorothiazide  (HYDRODIURIL ) 25 MG tablet Take 1 tablet (25 mg total) by mouth daily. 90 tablet 3   ibuprofen  (ADVIL ) 800 MG tablet Take 1 tablet (800 mg total) by mouth 3 (three) times daily with meals. 6 tablet 0   loperamide  (IMODIUM ) 2 MG capsule Take 2 capsules (4 mg total) by mouth 4 (four) times daily as needed for diarrhea or loose stools. 16  capsule 0   metFORMIN  (GLUCOPHAGE ) 500 MG tablet Take 1 tablet (500 mg total) by mouth daily with breakfast for 7 days, THEN 1 tablet (500 mg total) 2 (two) times daily with a meal for 21 days. 49 tablet 0   olmesartan  (BENICAR ) 20 MG tablet Take 1 tablet (20 mg total) by mouth daily. 90 tablet 3   ondansetron  (ZOFRAN -ODT) 4 MG disintegrating tablet Take 1 tablet (4 mg total) by mouth every 8 (eight) hours as needed for nausea or vomiting. 20 tablet 0   predniSONE  (DELTASONE ) 50 MG tablet Take 1 tablet (50 mg total) by mouth daily with breakfast. 5 tablet 0   Study - [id number] - bictegravir/emtrictabine/tenofovir  alafenamide (BIKTARVY ) 50-200-25 mg or placebo tablet ([facility]) Take 1 tablet by mouth daily. 105 tablet 0   Study - [id number] - MK-8591A 100/0.25 mg or placebo tablet ([facility]) Take 1 tablet by mouth daily. 105 tablet 0   No current facility-administered medications on file prior to visit.    Family History  Problem Relation Age of Onset   Hypertension Mother    Hypertension Father    Diabetes Sister    Hypertension Sister     Social History   Socioeconomic History   Marital status: Single    Spouse name: Not on file   Number of children: Not on file  Years of education: Not on file   Highest education level: Not on file  Occupational History   Not on file  Tobacco Use   Smoking status: Former    Current packs/day: 0.00    Types: Cigarettes    Quit date: 12/22/2013    Years since quitting: 10.0   Smokeless tobacco: Never  Vaping Use   Vaping status: Never Used  Substance and Sexual Activity   Alcohol use: No    Alcohol/week: 0.0 standard drinks of alcohol   Drug use: No   Sexual activity: Not Currently    Partners: Male    Birth control/protection: None  Other Topics Concern   Not on file  Social History Narrative   Not on file   Social Drivers of Health   Financial Resource Strain: Not on file  Food Insecurity: Not on file  Transportation  Needs: Not on file  Physical Activity: Not on file  Stress: Not on file  Social Connections: Not on file  Intimate Partner Violence: Not on file    Review of Systems: ROS negative except for what is noted on the assessment and plan.  Vitals:   01/24/24 1035 01/24/24 1042  BP: (!) 158/84 (!) 149/84  Pulse: 86 88  Temp: 98 F (36.7 C)   TempSrc: Oral   SpO2: 98%   Weight: 252 lb (114.3 kg)   Height: 5\' 6"  (1.676 m)     Physical Exam: Constitutional: well-appearing in no acute distress HENT: normocephalic atraumatic, mucous membranes moist Eyes: conjunctiva non-erythematous Neck: supple Cardiovascular: regular rate and rhythm, no m/r/g Pulmonary/Chest: normal work of breathing on room air, lungs clear to auscultation bilaterally Abdominal: soft, non-tender, non-distended MSK: normal bulk and tone Neurological: alert & oriented x 3, 5/5 strength in bilateral upper and lower extremities, normal gait Skin: warm and dr  Assessment & Plan:   Upper respiratory infection Patient notes that she started feeling sick last night.  She notes her grandson has been sick for around the last week but tested negative for COVID and the flu.  She has endorsed myalgias, headache, sore throat, and some back pain.  She denies any chills, night sweats, nausea, or vomiting.  For her back pain, she has no radiation or other red flag signs/symptoms.  I suspect this is a upper respiratory tract infection given her sick contact and associated signs/symptoms.  Will treat supportively at this time. - Supportive treatment  Patient discussed with Dr. Brigitte Canard, MD  [facility], PGY-1 Date 01/24/2024 Time 11:21 AM

## 2024-01-31 ENCOUNTER — Ambulatory Visit (INDEPENDENT_AMBULATORY_CARE_PROVIDER_SITE_OTHER): Payer: Self-pay

## 2024-01-31 ENCOUNTER — Ambulatory Visit (HOSPITAL_COMMUNITY)
Admission: EM | Admit: 2024-01-31 | Discharge: 2024-01-31 | Disposition: A | Discharge status: Home / Self Care | Payer: Self-pay | Attending: Emergency Medicine | Admitting: Emergency Medicine

## 2024-01-31 ENCOUNTER — Encounter (HOSPITAL_COMMUNITY): Payer: Self-pay

## 2024-01-31 DIAGNOSIS — R051 Acute cough: Secondary | ICD-10-CM

## 2024-01-31 DIAGNOSIS — J4521 Mild intermittent asthma with (acute) exacerbation: Secondary | ICD-10-CM

## 2024-01-31 DIAGNOSIS — J988 Other specified respiratory disorders: Secondary | ICD-10-CM

## 2024-01-31 DIAGNOSIS — B9789 Other viral agents as the cause of diseases classified elsewhere: Secondary | ICD-10-CM

## 2024-01-31 MED ORDER — PREDNISONE 20 MG PO TABS: 40.0000 mg | ORAL_TABLET | Freq: Every day | ORAL | 0 refills | Status: AC

## 2024-01-31 MED ORDER — AZELASTINE HCL 0.1 % NA SOLN: 2.0000 | Freq: Two times a day (BID) | NASAL | 0 refills | Status: DC

## 2024-01-31 MED ORDER — IPRATROPIUM-ALBUTEROL 0.5-2.5 (3) MG/3ML IN SOLN
3.0000 mL | Freq: Once | RESPIRATORY_TRACT | Status: AC
  Administered 2024-01-31: 3 mL via RESPIRATORY_TRACT

## 2024-01-31 MED ORDER — PROMETHAZINE-DM 6.25-15 MG/5ML PO SYRP: 5.0000 mL | ORAL_SOLUTION | Freq: Every evening | ORAL | 0 refills | Status: DC | PRN

## 2024-01-31 MED ORDER — BENZONATATE 100 MG PO CAPS: 100.0000 mg | ORAL_CAPSULE | Freq: Three times a day (TID) | ORAL | 0 refills | Status: DC

## 2024-01-31 MED ORDER — IPRATROPIUM-ALBUTEROL 0.5-2.5 (3) MG/3ML IN SOLN
RESPIRATORY_TRACT | Status: AC
  Filled 2024-01-31: qty 3

## 2024-01-31 NOTE — Discharge Instructions (Signed)
 Your x-ray is negative for any pneumonia or underlying illness.  As discussed I believe your symptoms are likely related to a viral respiratory illness that has exacerbated her asthma. Start taking 2 tablets of prednisone  once daily for 5 days to help with this. Take Tessalon  every 8 hours as needed for cough. Take Promethazine  DM cough syrup at bedtime as needed for cough.  This can make you drowsy so do not drive, work, or drink alcohol while taking this. Use azelastine nasal spray twice daily to help with additional relief of congestion. Continue to take Coricidin to help with cough and congestion. Continue to take Tylenol  as needed for any pain. Use albuterol  inhaler every 6 hours as needed for wheezing and shortness of breath. Follow-up with primary care provider or return here as needed.

## 2024-01-31 NOTE — ED Provider Notes (Signed)
 [facility]    CSN: [id number] Arrival date & time: 01/31/24  4098      History   Chief Complaint Chief Complaint  Patient presents with   Cough    HPI Monique [name] is a 53 y.o. female.   Patient presents with persistent cough, chest congestion, nasal congestion, and intermittent wheezing x 1 week.  Patient also endorses some intermittent chest tightness and shortness of breath.  Patient states that she has been using Tylenol , Coricidin, and using albuterol  inhaler with minimal relief.  Denies fever, persistent chest pain, severe difficulty breathing, abdominal pain, nausea, vomiting, and diarrhea.  Patient reports that her grandson was recently ill with similar symptoms.  Patient was seen on 5/30 by internal medicine and at that time had only had symptoms for a day prior to this visit.  At that time they only recommended Tylenol  and Coricidin for her symptoms.  Patient does have history of asthma.  The history is provided by the patient and medical records.  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   Lower extremity edema 01/12/2015   Lung abscess (HCC)    Morbid obesity (HCC)    Pneumonia    TB (tuberculosis)    Tonsillitis    Vaccine counseling 06/06/2022   Viral URI 09/16/2017   Weight gain 10/27/2018    Patient Active Problem List   Diagnosis Date Noted   Gastroenteritis 11/26/2023   Diabetes (HCC) 10/16/2023   Vaccine counseling 06/06/2022   ASCUS with positive high risk HPV cervical 07/10/2021   Asthma 07/10/2021   Finger numbness 07/10/2021   Hyperlipidemia 07/06/2021   Screening for cervical cancer 04/24/2020   Encounter for screening  mammogram for malignant neoplasm of breast 04/24/2020   Morbid obesity (HCC)    Seasonal allergies 01/26/2019   Healthcare maintenance 05/19/2018   Upper respiratory infection 09/16/2017   Exertional chest pain 05/13/2017   Obese 08/10/2015   Essential hypertension 01/12/2015   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      Home Medications    Prior to Admission medications   Medication Sig Start Date End Date Taking? Authorizing Provider  azelastine (ASTELIN) 0.1 % nasal spray Place 2 sprays into both nostrils 2 (two) times daily. Use in each nostril as directed 01/31/24  Yes Levora Reas A, NP  benzonatate  (TESSALON ) 100 MG capsule Take 1 capsule (100 mg total) by mouth every 8 (eight) hours. 01/31/24  Yes Rosevelt Constable, Faiza Bansal A, NP  predniSONE  (DELTASONE ) 20 MG tablet Take 2 tablets (40 mg total) by mouth daily for 5 days. 01/31/24 02/05/24 Yes Rosevelt Constable, Macdonald Rigor A, NP  promethazine -dextromethorphan  (PROMETHAZINE -DM) 6.25-15 MG/5ML syrup Take 5 mLs by mouth at bedtime as needed for cough. 01/31/24  Yes Levora Reas A, NP  albuterol  (VENTOLIN  HFA) 108 (90 Base) MCG/ACT  inhaler Inhale 1-2 puffs into the lungs every 6 (six) hours as needed for wheezing or shortness of breath. 11/18/23   Masters, Alston Jerry, DO  amLODipine  (NORVASC ) 10 MG tablet TAKE 1 TABLET(10 MG) BY MOUTH DAILY 10/08/23   Ernie Heal, Jerelyn Money, MD  aspirin  EC 81 MG tablet Take 1 tablet (81 mg total) by mouth daily. Swallow whole. 07/06/21   Avi Body, MD  atorvastatin  (LIPITOR) 20 MG tablet Take 1 tablet (20 mg  total) by mouth daily. 10/08/23 10/07/24  Charolette Copier, MD  cetirizine  (ZYRTEC  ALLERGY) 10 MG tablet Take 1 tablet (10 mg total) by mouth daily. 12/24/19 11/29/23  [place], Cleota Pellerito D, DO  Dulaglutide (TRULICITY) 0.75 MG/0.5ML SOAJ Inject 0.75 mg into the skin once a week. Patient not taking: Reported on 01/31/2024 11/18/23   Masters, Alston Jerry, DO  fluticasone  (FLONASE ) 50 MCG/ACT nasal spray Place 2 sprays into both nostrils daily. 07/12/22 11/29/23  Aida Alexander A, DO  hydrochlorothiazide  (HYDRODIURIL ) 25 MG tablet Take 1 tablet (25 mg total) by mouth daily. 11/18/23   Masters, Katie, DO  ibuprofen  (ADVIL ) 800 MG tablet Take 1 tablet (800 mg total) by mouth 3 (three) times daily with meals. 07/03/23   Afton Albright, MD  loperamide  (IMODIUM ) 2 MG capsule Take 2 capsules (4 mg total) by mouth 4 (four) times daily as needed for diarrhea or loose stools. 11/24/23   Reddick, Johnathan B, NP  metFORMIN  (GLUCOPHAGE ) 500 MG tablet Take 1 tablet (500 mg total) by mouth daily with breakfast for 7 days, THEN 1 tablet (500 mg total) 2 (two) times daily with a meal for 21 days. 10/31/23 11/29/23  Cleven Dallas, DO  olmesartan  (BENICAR ) 20 MG tablet Take 1 tablet (20 mg total) by mouth daily. 11/18/23   Masters, Katie, DO  Study - [id number] - bictegravir/emtrictabine/tenofovir  alafenamide (BIKTARVY ) 50-200-25 mg or placebo tablet ([facility]) Take 1 tablet by mouth daily. 01/23/24   Jamesetta Mcbride, PA-C  Study - [id number] - MK-8591A 100/0.25 mg or placebo tablet ([facility]) Take 1 tablet by mouth daily. 01/23/24   Jamesetta Mcbride, PA-C    Family History Family History  Problem Relation Age of Onset   Hypertension Mother    Hypertension Father    Diabetes Sister    Hypertension Sister     Social History Social History   Tobacco Use   Smoking status: Never   Smokeless tobacco: Never  Vaping Use   Vaping status: Never Used  Substance Use Topics   Alcohol use: Not Currently   Drug use: No      Allergies   Patient has no known allergies.   Review of Systems Review of Systems  Respiratory:  Positive for cough.    Per HPI  Physical Exam Triage Vital Signs ED Triage Vitals  Encounter Vitals Group     BP 01/31/24 0922 (!) 160/97     Systolic BP Percentile --      Diastolic BP Percentile --      Pulse Rate 01/31/24 0922 77     Resp 01/31/24 0922 16     Temp 01/31/24 0922 98.3 F (36.8 C)     Temp Source 01/31/24 0922 Oral     SpO2 01/31/24 0922 97 %     Weight --      Height --      Head Circumference --      Peak Flow --      Pain Score 01/31/24 0923 0     Pain  Loc --      Pain Education --      Exclude from Growth Chart --    No data found.  Updated Vital Signs BP (!) 160/97 (BP Location: Right Arm)   Pulse 77   Temp 98.3 F (36.8 C) (Oral)   Resp 16   LMP 01/22/2024 (Approximate)   SpO2 97%   Visual Acuity Right Eye Distance:   Left Eye Distance:   Bilateral Distance:    Right Eye Near:   Left Eye Near:    Bilateral Near:     Physical Exam Vitals and nursing note reviewed.  Constitutional:      General: She is awake. She is not in acute distress.    Appearance: Normal appearance. She is well-developed and well-groomed. She is not ill-appearing.  HENT:     Right Ear: Tympanic membrane, ear canal and external ear normal.     Left Ear: Tympanic membrane, ear canal and external ear normal.     Nose: Congestion and rhinorrhea present.     Mouth/Throat:     Mouth: Mucous membranes are moist.     Pharynx: Posterior oropharyngeal erythema and postnasal drip present. No oropharyngeal exudate.  Cardiovascular:     Rate and Rhythm: Normal rate and regular rhythm.  Pulmonary:     Effort: Pulmonary effort is normal.     Breath sounds: Wheezing present.  Skin:    General: Skin is warm and dry.  Neurological:     Mental Status: She is alert.  Psychiatric:        Behavior: Behavior is cooperative.      UC Treatments / Results  Labs (all  labs ordered are listed, but only abnormal results are displayed) Labs Reviewed - No data to display  EKG   Radiology DG Chest 2 View Result Date: 01/31/2024 CLINICAL DATA:  Cough, shortness of breath. EXAM: CHEST - 2 VIEW COMPARISON:  October 14, 2023. FINDINGS: Stable cardiomegaly. Stable left upper lobe scarring. Right lung is clear. The visualized skeletal structures are unremarkable. IMPRESSION: No acute cardiopulmonary abnormality seen. Electronically Signed   By: Rosalene Colon M.D.   On: 01/31/2024 11:28    Procedures Procedures (including critical care time)  Medications Ordered in UC Medications  ipratropium-albuterol  (DUONEB) 0.5-2.5 (3) MG/3ML nebulizer solution 3 mL (3 mLs Nebulization Given 01/31/24 1022)    Initial Impression / Assessment and Plan / UC Course  I have reviewed the triage vital signs and the nursing notes.  Pertinent labs & imaging results that were available during my care of the patient were reviewed by me and considered in my medical decision making (see chart for details).     Patient is well-appearing.  Vitals are stable.  Blood pressure is elevated at 160/97.  Patient states that she has not taken her blood pressure medication today.  Upon assessment congestion and rhinorrhea present, mild erythema and PND noted to pharynx.  Mild wheezing present to anterior lungs.  DuoNeb given with relief of wheezing.  Chest x-ray ordered.  Patient my interpretation there is no active cardiopulmonary disease on x-ray.  Radiology report confirms this.  Symptoms likely viral in nature and have not exacerbated her asthma.  Prescribed prednisone  burst for asthma exacerbation.  Discussed when to use albuterol  inhaler.  Prescribed Tessalon  Promethazine  DM as needed for cough.  Prescribed azelastine nasal spray to help with nasal congestion.  Discussed over-the-counter medication as needed for symptoms.  Discussed follow-up and return precautions. Final Clinical  Impressions(s) /  UC Diagnoses   Final diagnoses:  Acute cough  Mild intermittent asthma with acute exacerbation  Viral respiratory illness     Discharge Instructions      Your x-ray is negative for any pneumonia or underlying illness.  As discussed I believe your symptoms are likely related to a viral respiratory illness that has exacerbated her asthma. Start taking 2 tablets of prednisone  once daily for 5 days to help with this. Take Tessalon  every 8 hours as needed for cough. Take Promethazine  DM cough syrup at bedtime as needed for cough.  This can make you drowsy so do not drive, work, or drink alcohol while taking this. Use azelastine nasal spray twice daily to help with additional relief of congestion. Continue to take Coricidin to help with cough and congestion. Continue to take Tylenol  as needed for any pain. Use albuterol  inhaler every 6 hours as needed for wheezing and shortness of breath. Follow-up with primary care provider or return here as needed.   ED Prescriptions     Medication Sig Dispense Auth. Provider   benzonatate  (TESSALON ) 100 MG capsule Take 1 capsule (100 mg total) by mouth every 8 (eight) hours. 21 capsule Rosevelt Constable, Rudolph Daoust A, NP   predniSONE  (DELTASONE ) 20 MG tablet Take 2 tablets (40 mg total) by mouth daily for 5 days. 10 tablet Levora Reas A, NP   promethazine -dextromethorphan  (PROMETHAZINE -DM) 6.25-15 MG/5ML syrup Take 5 mLs by mouth at bedtime as needed for cough. 118 mL Rosevelt Constable, Chima Astorino A, NP   azelastine (ASTELIN) 0.1 % nasal spray Place 2 sprays into both nostrils 2 (two) times daily. Use in each nostril as directed 30 mL Levora Reas A, NP      PDMP not reviewed this encounter.   Levora Reas A, NP 01/31/24 1229

## 2024-01-31 NOTE — Progress Notes (Signed)
 Internal Medicine Clinic Attending  Case discussed with the resident at the time of the visit.  We reviewed the resident's history and exam and pertinent patient test results.  I agree with the assessment, diagnosis, and plan of care documented in the resident's note.

## 2024-01-31 NOTE — ED Triage Notes (Signed)
 Patient here today with c/o cough, chest congestion, wheeze, and nasal congestion X 1 week. She has been taking Tylenol  with no relief.

## 2024-02-17 ENCOUNTER — Ambulatory Visit: Payer: Self-pay | Admitting: Student

## 2024-02-17 VITALS — BP 163/119 | HR 80 | Temp 97.8°F | Ht 66.0 in | Wt 253.0 lb

## 2024-02-17 DIAGNOSIS — Z7984 Long term (current) use of oral hypoglycemic drugs: Secondary | ICD-10-CM

## 2024-02-17 DIAGNOSIS — Z7985 Long-term (current) use of injectable non-insulin antidiabetic drugs: Secondary | ICD-10-CM | POA: Diagnosis not present

## 2024-02-17 DIAGNOSIS — K921 Melena: Secondary | ICD-10-CM | POA: Diagnosis not present

## 2024-02-17 DIAGNOSIS — I10 Essential (primary) hypertension: Secondary | ICD-10-CM | POA: Diagnosis not present

## 2024-02-17 DIAGNOSIS — E119 Type 2 diabetes mellitus without complications: Secondary | ICD-10-CM

## 2024-02-17 LAB — POCT GLYCOSYLATED HEMOGLOBIN (HGB A1C): Hemoglobin A1C: 6 % — AB (ref 4.0–5.6)

## 2024-02-17 LAB — GLUCOSE, CAPILLARY: Glucose-Capillary: 88 mg/dL (ref 70–99)

## 2024-02-17 MED ORDER — TRULICITY 0.75 MG/0.5ML ~~LOC~~ SOAJ
0.7500 mg | SUBCUTANEOUS | 3 refills | Status: AC
Start: 2024-02-17 — End: ?

## 2024-02-17 MED ORDER — METFORMIN HCL 500 MG PO TABS
500.0000 mg | ORAL_TABLET | Freq: Every day | ORAL | 2 refills | Status: DC
Start: 2024-02-17 — End: 2024-04-02

## 2024-02-17 MED ORDER — OLMESARTAN-AMLODIPINE-HCTZ 40-10-25 MG PO TABS
1.0000 | ORAL_TABLET | Freq: Every day | ORAL | 2 refills | Status: DC
Start: 2024-02-17 — End: 2024-07-02

## 2024-02-17 NOTE — Assessment & Plan Note (Signed)
 Blood pressure elevated today at 189/90 which is consistent with her home blood pressure readings.  She is currently on amlodipine  10 mg daily, hydrochlorothiazide  25 mg daily, and olmesartan  20 mg daily.  She occasionally has some orthostatic dizziness without falls or feeling like she is at risk of falling.  This happens at most once a week.  Orthostatic blood pressures here are normal.  Labs this year showed stable and normal renal function. - Increase olmesartan  to 40 mg daily and added combination pill with amlodipine  10 mg daily and hydrochlorothiazide  25 mg daily - Counseled extensively on orthostatic symptoms and plan to revert back to prior doses if this happens - Continue to check blood pressures at home - Return in 1 month for nurses visit for repeat orthostatic blood pressures - BMP today

## 2024-02-17 NOTE — Assessment & Plan Note (Addendum)
 Last A1c was 6.5 on 10/16/2023 and is improved at 6.0 today.  She is currently only taking metformin  500 mg daily.  She recently got a new health insurance and they should be able to cover Trulicity  so she would like to finally start this. - Continue metformin  500 mg daily - Start Trulicity  0.75 mg weekly - Return in 3 months for repeat A1c, and could discontinue metformin  if able to get Trulicity 

## 2024-02-17 NOTE — Patient Instructions (Addendum)
 Thank you, Monique [name], for allowing us  to provide your care today. Today we discussed . . .  > Hypertension       - Your blood pressure is still elevated and I would like to increase whenever medications.  I am going to increase the olmesartan  and also send in a combination medication that includes olmesartan , hydrochlorothiazide , and amlodipine .  If you get more dizzy at home after starting this new medication please go back to taking it with 3 separate medications at the previous doses that you were before this visit.  Please continue to take your blood pressure at home and we will have you back in about 1 month to recheck your blood pressure and orthostatic blood pressures. > Dark bowel movements       - Please stop taking the ibuprofen  and aspirin .  For pain you can take Tylenol  1000 mg every 8 hours as needed.  You can also use topical therapies such as lidocaine  patches and Voltaren gel.  In addition to the ibuprofen  please do not take naproxen (Aleve), diclofenac (Voltaren, pill form), or meloxicam.  Please follow-up with gastroenterology, they should contact you but if you have not heard from anyone by the end of next week call our clinic and we can tell you who to call to schedule your appointment.  If you have any increased dark bowel movements, red bowel movements, or other worrisome symptoms please do not hesitate to call our office. > Diabetes       - Please continue to take your metformin  500 mg daily and I have sent in a prescription for the Trulicity  so we should be able to get the prior authorization started.  We are getting your A1c today if this is increasing I will call you to talk about increasing the metformin  to twice daily and possibly increasing to 1000 mg twice daily.   I have ordered the following labs for you:  Lab Orders         Microalbumin / Creatinine Urine Ratio         CBC no Diff         Iron, TIBC and Ferritin Panel         BMP8+Anion Gap         POC Hbg  A1C       Referrals ordered today:   Referral Orders         Ambulatory referral to Gastroenterology       Follow up: 1 month for nurses visit with blood pressure and orthostatics then a regular follow-up 3 months from now   Remember:  Should you have any questions or concerns please call the internal medicine clinic at [phone].     Fairy Pool, DO [facility]

## 2024-02-17 NOTE — Assessment & Plan Note (Signed)
 Patient reports a few months of intermittent melena without hematochezia.  She currently takes a daily aspirin  81 mg and as needed ibuprofen  at least every other day without an H2 blocker or PPI.  Reviewing labs shows that her hemoglobin has dropped from 13.8 in February down to 11.7 on 01/23/2024.  She has not had a colonoscopy in the past. - Repeat CBC and check iron panel - Stop ibuprofen , aspirin , and other NSAIDs - Referral to GI for colonoscopy/EGD

## 2024-02-17 NOTE — Progress Notes (Signed)
 CC: Routine Follow Up for hypertension and diabetes after last office visit 11/18/2023  HPI:  Monique [name] is a 53 y.o. female with pertinent PMH of HTN, T2DM, HIV, obesity who presents as above. Please see assessment and plan below for further details.  Medications: Current Outpatient Medications  Medication Instructions   albuterol  (VENTOLIN  HFA) 108 (90 Base) MCG/ACT inhaler 1-2 puffs, Inhalation, Every 6 hours PRN   atorvastatin  (LIPITOR) 20 mg, Oral, Daily   azelastine  (ASTELIN ) 0.1 % nasal spray 2 sprays, Each Nare, 2 times daily, Use in each nostril as directed   benzonatate  (TESSALON ) 100 mg, Oral, Every 8 hours   cetirizine  (ZYRTEC  ALLERGY) 10 mg, Oral, Daily   fluticasone  (FLONASE ) 50 MCG/ACT nasal spray 2 sprays, Each Nare, Daily   loperamide  (IMODIUM ) 4 mg, Oral, 4 times daily PRN   metFORMIN  (GLUCOPHAGE ) 500 mg, Oral, Daily with breakfast   Olmesartan -amLODIPine -HCTZ 40-10-25 MG TABS 1 tablet, Oral, Daily   promethazine -dextromethorphan  (PROMETHAZINE -DM) 6.25-15 MG/5ML syrup 5 mLs, Oral, At bedtime PRN   Study - [id number] - bictegravir/emtrictabine/tenofovir  alafenamide (BIKTARVY ) 50-200-25 mg or placebo tablet ([place]) 1 tablet, Oral, Daily   Study - [id number] - MK-8591A 100/0.25 mg or placebo tablet ([place]) 1 tablet, Oral, Daily   Trulicity  0.75 mg, Subcutaneous, Weekly     Review of Systems:   Pertinent items noted in HPI and/or A&P.  Physical Exam:  Vitals:   02/17/24 1520 02/17/24 1551  BP: (!) 189/90 (!) 163/119  Pulse: 87 80  Temp: 97.8 F (36.6 C)   TempSrc: Oral   SpO2: 98%   Weight: 253 lb (114.8 kg)   Height: 5' 6 (1.676 m)     Constitutional: Well-appearing adult female. In no acute distress. HEENT: Normocephalic, atraumatic, Sclera non-icteric, PERRL, EOM intact Cardio:Regular rate and rhythm. 2+ bilateral radial pulses. Pulm:Clear to auscultation bilaterally. Normal work of breathing on room air. Abdomen: Soft,  non-tender, non-distended, positive bowel sounds. MSK: Trace lower extremity edema bilaterally Skin:Warm and dry. Neuro:Alert and oriented x3. No focal deficit noted. Psych:Pleasant mood and affect.   Assessment & Plan:   Essential hypertension Blood pressure elevated today at 189/90 which is consistent with her home blood pressure readings.  She is currently on amlodipine  10 mg daily, hydrochlorothiazide  25 mg daily, and olmesartan  20 mg daily.  She occasionally has some orthostatic dizziness without falls or feeling like she is at risk of falling.  This happens at most once a week.  Orthostatic blood pressures here are normal.  Labs this year showed stable and normal renal function. - Increase olmesartan  to 40 mg daily and added combination pill with amlodipine  10 mg daily and hydrochlorothiazide  25 mg daily - Counseled extensively on orthostatic symptoms and plan to revert back to prior doses if this happens - Continue to check blood pressures at home - Return in 1 month for nurses visit for repeat orthostatic blood pressures - BMP today  Diabetes (HCC) Last A1c was 6.5 on 10/16/2023 and is improved at 6.0 today.  She is currently only taking metformin  500 mg daily.  She recently got a new health insurance and they should be able to cover Trulicity  so she would like to finally start this. - Continue metformin  500 mg daily - Start Trulicity  0.75 mg weekly - Return in 3 months for repeat A1c, and could discontinue metformin  if able to get Trulicity   Melena Patient reports a few months of intermittent melena without hematochezia.  She currently takes a daily aspirin  81 mg  and as needed ibuprofen  at least every other day without an H2 blocker or PPI.  Reviewing labs shows that her hemoglobin has dropped from 13.8 in February down to 11.7 on 01/23/2024.  She has not had a colonoscopy in the past. - Repeat CBC and check iron panel - Stop ibuprofen , aspirin , and other NSAIDs - Referral to GI for  colonoscopy/EGD    Patient discussed with Dr. Mliss Foot  Fairy Pool, DO [facility] Internal [facility] Resident PGY-2 Clinic Phone: [phone] Please contact the on call pager at [phone] for any urgent or emergent needs.

## 2024-02-18 ENCOUNTER — Telehealth: Payer: Self-pay

## 2024-02-18 LAB — CBC
Hematocrit: 36.1 % (ref 34.0–46.6)
Hemoglobin: 11 g/dL — ABNORMAL LOW (ref 11.1–15.9)
MCH: 25.3 pg — ABNORMAL LOW (ref 26.6–33.0)
MCHC: 30.5 g/dL — ABNORMAL LOW (ref 31.5–35.7)
MCV: 83 fL (ref 79–97)
Platelets: 295 10*3/uL (ref 150–450)
RBC: 4.35 x10E6/uL (ref 3.77–5.28)
RDW: 16 % — ABNORMAL HIGH (ref 11.7–15.4)
WBC: 9.7 10*3/uL (ref 3.4–10.8)

## 2024-02-18 LAB — MICROALBUMIN / CREATININE URINE RATIO
Creatinine, Urine: 128.5 mg/dL
Microalb/Creat Ratio: 32 mg/g{creat} — ABNORMAL HIGH (ref 0–29)
Microalbumin, Urine: 41.2 ug/mL

## 2024-02-18 LAB — IRON,TIBC AND FERRITIN PANEL
Ferritin: 47 ng/mL (ref 15–150)
Iron Saturation: 12 % — ABNORMAL LOW (ref 15–55)
Iron: 38 ug/dL (ref 27–159)
Total Iron Binding Capacity: 318 ug/dL (ref 250–450)
UIBC: 280 ug/dL (ref 131–425)

## 2024-02-18 LAB — BMP8+ANION GAP
Anion Gap: 13 mmol/L (ref 10.0–18.0)
BUN/Creatinine Ratio: 22 (ref 9–23)
BUN: 15 mg/dL (ref 6–24)
CO2: 21 mmol/L (ref 20–29)
Calcium: 9.1 mg/dL (ref 8.7–10.2)
Chloride: 102 mmol/L (ref 96–106)
Creatinine, Ser: 0.69 mg/dL (ref 0.57–1.00)
Glucose: 82 mg/dL (ref 70–99)
Potassium: 4 mmol/L (ref 3.5–5.2)
Sodium: 136 mmol/L (ref 134–144)
eGFR: 104 mL/min/{1.73_m2} (ref >59)

## 2024-02-18 NOTE — Telephone Encounter (Signed)
 Prior Authorization for patient (Trulicity  0.75MG /0.5ML auto-injectors) came through on cover my meds was submitted awaiting approval or denial.  KEY:[id number]

## 2024-02-18 NOTE — Telephone Encounter (Unsigned)
 Copied from CRM [id number]. Topic: Clinical - Prescription Issue >> Feb 18, 2024 11:54 AM Mercer PEDLAR wrote: Reason for CRM: Patient called stating that she was informed by [facility] that prior auth is needed for Dulaglutide  (TRULICITY ) 0.75 MG/0.5ML SOAJ

## 2024-02-18 NOTE — Telephone Encounter (Signed)
 Nashly Aschoff (KeyBETHA GOWERS) Rx #: [id number] Need Help? Call us  at [phone] Outcome Additional Information Required Your PA has been resolved, no additional PA is required. For further inquiries please contact the number on the back of the member prescription card. (Message [phone]) Drug Trulicity  0.75MG /0.5ML auto-injectors ePA cloud Optician, dispensing PA Form [phone] NCPDP) Original Claim Info [id number] PRIOR AUTH REQ-MD CALL [phone] THERAPY IS REQUIREDDRUG REQUIRES PRIOR AUTHORIZATION(PHARMACY HELP DESK [phone])

## 2024-02-19 NOTE — Progress Notes (Signed)
 Internal Medicine Clinic Attending  Case discussed with the resident at the time of the visit.  We reviewed the resident's history and exam and pertinent patient test results.  I agree with the assessment, diagnosis, and plan of care documented in the resident's note.

## 2024-02-21 ENCOUNTER — Ambulatory Visit: Payer: Self-pay | Admitting: Student

## 2024-02-24 ENCOUNTER — Telehealth: Payer: Self-pay

## 2024-02-24 ENCOUNTER — Telehealth: Payer: Self-pay | Admitting: *Deleted

## 2024-02-24 NOTE — Telephone Encounter (Signed)
 Patient is returning Dr.Goodwin's call regarding her lab results. Please return patients call.

## 2024-02-24 NOTE — Telephone Encounter (Signed)
 Will forward to Dr. Jolaine.  Copied from CRM [id number]. Topic: Clinical - Lab/Test Results >> Feb 21, 2024  4:24 PM Graeme ORN wrote: Reason for CRM: Patient called. Returned missed call for labs. No note to provide to patient. Office closed at 12. Thank You

## 2024-02-24 NOTE — Telephone Encounter (Signed)
 Patient called back. States she is trying to get back in touch with provider about results. Would like the nurse or someone to call her because she just picked up Trulicity  and is in a study and wants to make sure its ok to take it. Called [facility] - awaiting provider response. Thank You

## 2024-02-25 ENCOUNTER — Telehealth: Payer: Self-pay

## 2024-02-25 NOTE — Telephone Encounter (Signed)
 Patient left voicemail for research team to ask if she is able to start Trulicity  while in study. Has not started this and will wait until response from our office. Messaged International aid/development worker with research for clarification.  Lorenda CHRISTELLA Code, RMA

## 2024-02-26 NOTE — Progress Notes (Signed)
 Unable to reach the patient over the phone. CBC shows slightly decreased Hgb with normal MCV and iron sat of 12 but otherwise normal iron, ferritin, and TIBC. BMP WNL. Urine microalbumin moderately increased. Olmesartan  increased from 20 to 40 mg at same visit. Recommend recheck in 6 months and consider SGLT2i if still elevated.

## 2024-03-23 ENCOUNTER — Telehealth: Payer: Self-pay

## 2024-03-23 ENCOUNTER — Telehealth: Payer: Self-pay | Admitting: *Deleted

## 2024-03-23 NOTE — Telephone Encounter (Signed)
 Prior Authorization has already been submitted and approved. Please see my note from today 03/23/24.  Jozlyn Kamaka ([name]) PA Case ID #: [id number] Rx #: [id number] Need Help? Call us  at [phone] Outcome Approved today by [facility] NCPDP 2017 Your PA request has been approved. Additional information will be provided in the approval communication. (Message 1145) Effective Date: 03/23/2024 Authorization Expiration Date: 03/23/2025 Drug Trulicity  0.75MG /0.5ML auto-injectors ePA cloud Psychologist, educational Electronic PA Form [phone] NCPDP) Original Claim Info [id number] PRIOR AUTH REQ-MD CALL [phone] THERAPY IS REQUIREDDRUG REQUIRES PRIOR AUTHORIZATION(PHARMACY HELP DESK [phone])

## 2024-03-23 NOTE — Telephone Encounter (Signed)
 Prior Authorization for patient (Trulicity  0.75MG /0.5ML auto-injectors) came through on cover my meds was submitted with last office notes and labs awaiting approval or denial.  KEY:[id number]

## 2024-03-23 NOTE — Telephone Encounter (Signed)
 Monique [name] ([name]) PA Case ID #: [id number] Rx #: [id number] Need Help? Call us  at [phone] Outcome Approved today by [facility] NCPDP 2017 Your PA request has been approved. Additional information will be provided in the approval communication. (Message 1145) Effective Date: 03/23/2024 Authorization Expiration Date: 03/23/2025 Drug Trulicity  0.75MG /0.5ML auto-injectors ePA cloud Psychologist, educational Electronic PA Form [phone] NCPDP) Original Claim Info [id number] PRIOR AUTH REQ-MD CALL [phone] THERAPY IS REQUIREDDRUG REQUIRES PRIOR AUTHORIZATION(PHARMACY HELP DESK [phone])

## 2024-03-23 NOTE — Telephone Encounter (Signed)
 Copied from CRM [id number]. Topic: Clinical - Prescription Issue >> Mar 23, 2024 11:33 AM Mace SQUIBB wrote: Reason for CRM: Patient needs a PA for her trulicity 

## 2024-04-02 ENCOUNTER — Other Ambulatory Visit: Payer: Self-pay

## 2024-04-02 DIAGNOSIS — Z006 Encounter for examination for normal comparison and control in clinical research program: Secondary | ICD-10-CM

## 2024-04-02 MED ORDER — STUDY - MK-8591A-052 - MK-8591A 100/0.25 MG OR PLACEBO TABLET (PI-VAN DAM): 1.0000 | ORAL_TABLET | Freq: Every day | ORAL | 0 refills | Status: DC

## 2024-04-02 MED ORDER — STUDY - MK-8591A-052 - BICTEGRAVIR/EMTRICITABINE/TENOFOVIR ALAFENAMIDE (BIKTARVY) 50-200-25 MG OR PLACEBO TABLET (PI-VAN DAM): 1.0000 | ORAL_TABLET | Freq: Every day | ORAL | 0 refills | Status: DC

## 2024-04-02 NOTE — Research (Signed)
 Vivica seen today for her week 96 visit for [id number], A Phase 3, Randomized, Active-Controlled, Double-Blind Clinical Study to Evaluate a Switch to Doravirine/Islatravir (DOR/ISL 100 mg/0.25 mg) Once-Daily in Participants With HIV-1 Who Are Virologically Suppressed on Bictegravir/Emtricitabine /Tenofovir  Alafenamide (BIC/FTC/TAF). She did return study IP bottles BP was elevated today. Denied any headache, visual changes, dizziness or chest pain. She states she does not drink enough water, enc her to carry water with her and keep track of how much water she is drinking. Enc 40-60 oz of water daily. She did take her a BP medications and said having a pill box helps her with compliance. New IFC signed, declined copy. All study procedures completed per protocol. Study IP dispensed. She will return in October for her next study visit. Having her DEXA scan today.

## 2024-05-23 ENCOUNTER — Other Ambulatory Visit: Payer: Self-pay

## 2024-05-23 ENCOUNTER — Encounter (HOSPITAL_COMMUNITY): Payer: Self-pay | Admitting: *Deleted

## 2024-05-23 ENCOUNTER — Ambulatory Visit (HOSPITAL_COMMUNITY)
Admission: EM | Admit: 2024-05-23 | Discharge: 2024-05-23 | Disposition: A | Discharge status: Home / Self Care | Attending: Emergency Medicine | Admitting: Emergency Medicine

## 2024-05-23 DIAGNOSIS — J0100 Acute maxillary sinusitis, unspecified: Secondary | ICD-10-CM

## 2024-05-23 MED ORDER — AMOXICILLIN-POT CLAVULANATE 875-125 MG PO TABS: 1.0000 | ORAL_TABLET | Freq: Two times a day (BID) | ORAL | 0 refills | Status: DC

## 2024-05-23 NOTE — ED Triage Notes (Signed)
 PT reports facial pain and swelling for one week. Pt has used OTC allergy meds with out relief.

## 2024-05-23 NOTE — Discharge Instructions (Signed)
 You were seen at urgent care today for concerns of sinus pain and pressure. I believe you likely have a sinus infection. I have started you on a course of antibiotics to take for the next week. Please take these as prescribed. Continue using your Flonase  spray and use a sinus rinse with sterile water to also help alleviate some of the pressure you are feeling. For any concerns of new or worsening symptoms, return to urgent care, follow up with your primary care provider, or go to the ER.

## 2024-05-23 NOTE — ED Triage Notes (Signed)
 PT is on a trial study for HTN. Pt reports she did not take BP meds this morning  BP 175/110

## 2024-05-23 NOTE — ED Provider Notes (Signed)
 [facility]    CSN: [id number] Arrival date & time: 05/23/24  1021      History   Chief Complaint Chief Complaint  Patient presents with   Facial Pain    HPI Monique [name] is a 53 y.o. female. Patient with history of diabetes, morbid obesity, and HIV presents to urgent care with concern of facial pain. She reports that this pain has been ongoing for the last week and had preceding URI type symptoms. Denies any fever. Has had some pain with mastication but denies any dental concerns. States her nasal drainage is primarily clear but has had some slight yellowing. No reported chest pain, shortness of breath, or GI symptoms.  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   Lower extremity edema 01/12/2015   Lung abscess (HCC)    Morbid obesity (HCC)    Pneumonia    TB (tuberculosis)    Tonsillitis    Vaccine counseling 06/06/2022   Viral URI 09/16/2017   Weight gain 10/27/2018    Patient Active Problem List   Diagnosis Date Noted   Melena 02/17/2024   Gastroenteritis 11/26/2023   Diabetes (HCC) 10/16/2023   Vaccine counseling 06/06/2022   ASCUS with positive high risk HPV cervical 07/10/2021   Asthma 07/10/2021   Finger numbness 07/10/2021   Hyperlipidemia 07/06/2021   Screening for cervical cancer 04/24/2020   Encounter for screening mammogram for malignant neoplasm of breast 04/24/2020   Morbid obesity (HCC)    Seasonal allergies 01/26/2019   Healthcare maintenance 05/19/2018   Upper respiratory infection 09/16/2017   Exertional chest pain 05/13/2017   Obese 08/10/2015   Essential hypertension 01/12/2015   Lipodystrophy due to HIV infection and  antiretroviral therapy (HCC) 01/12/2015   Human immunodeficiency virus (HIV) disease (HCC) 04/10/2013    Past Surgical History:  Procedure Laterality Date   CESAREAN SECTION     VIDEO BRONCHOSCOPY Bilateral 04/13/2013   Procedure: VIDEO BRONCHOSCOPY WITHOUT FLUORO;  Surgeon: Dorethia Cave, MD;  Location: [facility] ENDOSCOPY;  Service: Cardiopulmonary;  Laterality: Bilateral;    OB History   No obstetric history on file.      Home Medications    Prior to Admission medications   Medication Sig Start Date End Date Taking? Authorizing Provider  albuterol  (VENTOLIN  HFA) 108 (90 Base) MCG/ACT inhaler Inhale 1-2 puffs into the lungs every 6 (six) hours as needed for wheezing or shortness of breath. 11/18/23  Yes Masters, Katie, DO  amoxicillin -clavulanate (AUGMENTIN ) 875-125 MG tablet Take 1 tablet by mouth every 12 (twelve) hours. 05/23/24  Yes Lailie Smead A, PA-C  atorvastatin  (LIPITOR) 20 MG tablet Take 1 tablet (20 mg total) by mouth daily. 10/08/23 10/07/24 Yes Fleeta Kathie Jomarie LOISE, MD  azelastine  (ASTELIN ) 0.1 % nasal spray Place 2 sprays into both nostrils 2 (two) times daily. Use in each nostril as directed 01/31/24  Yes Johnie Flaming A, NP  Dulaglutide  (TRULICITY ) 0.75 MG/0.5ML SOAJ Inject 0.75 mg into the skin once a week. 02/17/24  Yes Jolaine Pac, DO  fluticasone  (FLONASE ) 50 MCG/ACT nasal spray Place 2 sprays into both nostrils daily. 07/12/22 05/23/24 Yes Tedrowe, Michelle A, DO  Olmesartan -amLODIPine -HCTZ 40-10-25 MG TABS Take 1 tablet by mouth daily at 12  noon. 02/17/24  Yes Jolaine Pac, DO  Study [phone] - bictegravir/emtrictabine/tenofovir  alafenamide (BIKTARVY ) 50-200-25 mg or placebo tablet ([place]) Take 1 tablet by mouth daily. 04/02/24  Yes Orlando Burnard JONELLE DEVONNA  Study - [id number] - FX-1408J 100/0.25 mg or placebo tablet ([place]) Take 1 tablet by mouth daily. 04/02/24  Yes Orlando Burnard JONELLE, PA-C  cetirizine  (ZYRTEC  ALLERGY) 10 MG tablet Take 1 tablet (10  mg total) by mouth daily. 12/24/19 11/29/23  Harlee Flaming D, DO    Family History Family History  Problem Relation Age of Onset   Hypertension Mother    Hypertension Father    Diabetes Sister    Hypertension Sister     Social History Social History   Tobacco Use   Smoking status: Never   Smokeless tobacco: Never  Vaping Use   Vaping status: Never Used  Substance Use Topics   Alcohol use: Not Currently   Drug use: No     Allergies   Patient has no known allergies.   Review of Systems Review of Systems  HENT:  Positive for congestion.   All other systems reviewed and are negative.    Physical Exam Triage Vital Signs ED Triage Vitals  Encounter Vitals Group     BP 05/23/24 1109 (!) 175/110     Girls Systolic BP Percentile --      Girls Diastolic BP Percentile --      Boys Systolic BP Percentile --      Boys Diastolic BP Percentile --      Pulse Rate 05/23/24 1109 89     Resp 05/23/24 1109 20     Temp 05/23/24 1109 98.6 F (37 C)     Temp src --      SpO2 05/23/24 1109 93 %     Weight --      Height --      Head Circumference --      Peak Flow --      Pain Score 05/23/24 1107 8     Pain Loc --      Pain Education --      Exclude from Growth Chart --    No data found.  Updated Vital Signs BP (!) 175/110   Pulse 89   Temp 98.6 F (37 C)   Resp 20   LMP 04/26/2024 (Approximate)   SpO2 93%   Visual Acuity Right Eye Distance:   Left Eye Distance:   Bilateral Distance:    Right Eye Near:   Left Eye Near:    Bilateral Near:     Physical Exam Vitals and nursing note reviewed.  Constitutional:      General: She is not in acute distress.    Appearance: She is well-developed.  HENT:     Head: Normocephalic and atraumatic.     Comments: TTP along bilateral maxillary sinuses. No frontal sinus pain to palpation.    Right Ear: Tympanic membrane, ear canal and external ear normal. There is no impacted cerumen.     Left Ear: Tympanic membrane,  ear canal and external ear normal. There is no impacted cerumen.     Nose: Congestion present.     Mouth/Throat:     Mouth: Mucous membranes are moist.     Pharynx: Oropharynx is clear.  Eyes:     Conjunctiva/sclera: Conjunctivae normal.  Cardiovascular:     Rate and Rhythm: Normal rate and regular rhythm.     Heart sounds: No murmur heard. Pulmonary:  Effort: Pulmonary effort is normal. No respiratory distress.     Breath sounds: Normal breath sounds.  Abdominal:     Palpations: Abdomen is soft.     Tenderness: There is no abdominal tenderness.  Musculoskeletal:        General: No swelling.     Cervical back: Neck supple.  Skin:    General: Skin is warm and dry.     Capillary Refill: Capillary refill takes less than 2 seconds.  Neurological:     Mental Status: She is alert.  Psychiatric:        Mood and Affect: Mood normal.      UC Treatments / Results  Labs (all labs ordered are listed, but only abnormal results are displayed) Labs Reviewed - No data to display  EKG   Radiology No results found.  Procedures Procedures (including critical care time)  Medications Ordered in UC Medications - No data to display  Initial Impression / Assessment and Plan / UC Course  I have reviewed the triage vital signs and the nursing notes.  Pertinent labs & imaging results that were available during my care of the patient were reviewed by me and considered in my medical decision making (see chart for details).     This patient presents to the UC for concern of facial pain.  Differential diagnosis includes sinusitis, conjunctivitis, shingles   Problem List / UC Course:  Patient with past history significant for diabetes, obesity, HIV presents to urgent care today with concerns of facial pain.  Reports ongoing issues with primary maxillary sinus facial pain and pressure for the last week or so.  Had initially had viral upper respiratory type symptoms prior to the  development of this facial pain and pressure.  Endorses a slight drainage when she blows her nose that is primarily clear with a slight yellowing discoloration.  No reported fever, cough,, chest pain, shortness of breath. Physical exam reveals primarily tenderness to the left lower sinus.  No frontal sinus tenderness or pressure.  No obvious signs of any dental involvement. Based on findings, concern primarily for acute bacterial sinusitis.  Will start patient on a course of Augmentin  for the next week.  Advised continued use of Flonase  nasal spray as well as saline rinses using sterile water.  Return precautions discussed.  ED precautions advised.  Stable for outpatient follow-up and discharged home.   Social Determinants of Health:  History of HIV  Final Clinical Impressions(s) / UC Diagnoses   Final diagnoses:  Acute non-recurrent maxillary sinusitis     Discharge Instructions      You were seen at urgent care today for concerns of sinus pain and pressure. I believe you likely have a sinus infection. I have started you on a course of antibiotics to take for the next week. Please take these as prescribed. Continue using your Flonase  spray and use a sinus rinse with sterile water to also help alleviate some of the pressure you are feeling. For any concerns of new or worsening symptoms, return to urgent care, follow up with your primary care provider, or go to the ER.     ED Prescriptions     Medication Sig Dispense Auth. Provider   amoxicillin -clavulanate (AUGMENTIN ) 875-125 MG tablet Take 1 tablet by mouth every 12 (twelve) hours. 14 tablet Manasvi Dickard A, PA-C      PDMP not reviewed this encounter.   Candler Ginsberg A, PA-C 05/23/24 1135

## 2024-06-23 ENCOUNTER — Telehealth: Payer: Self-pay

## 2024-06-23 NOTE — Telephone Encounter (Signed)
 Called patient to advise time to renew financial application, no voice mail to leave message.

## 2024-06-25 ENCOUNTER — Other Ambulatory Visit: Payer: Self-pay

## 2024-06-25 ENCOUNTER — Encounter

## 2024-06-25 VITALS — BP 180/120 | HR 76 | Temp 97.8°F | Resp 16 | Wt 254.2 lb

## 2024-06-25 DIAGNOSIS — Z006 Encounter for examination for normal comparison and control in clinical research program: Secondary | ICD-10-CM

## 2024-06-25 MED ORDER — STUDY - MK-8591A-052 - MK-8591A 100/0.25 MG OR PLACEBO TABLET (PI-VAN DAM): 1.0000 | ORAL_TABLET | Freq: Every day | ORAL | 0 refills | Status: AC

## 2024-06-25 MED ORDER — STUDY - MK-8591A-052 - BICTEGRAVIR/EMTRICITABINE/TENOFOVIR ALAFENAMIDE (BIKTARVY) 50-200-25 MG OR PLACEBO TABLET (PI-VAN DAM): 1.0000 | ORAL_TABLET | Freq: Every day | ORAL | 0 refills | Status: AC

## 2024-06-25 NOTE — Research (Signed)
 Individual seen for research visit Week 108 for [id number], A Phase 3, Randomized, Active-Controlled, Double-Blind Clinical Study to Evaluate a Switch to Doravirine/Islatravir (DOR/ISL 100 mg/0.25 mg) Once-Daily in Participants With HIV-1 Who Are Virologically Suppressed on Bictegravir/Emtricitabine /Tenofovir  Alafenamide (BIC/FTC/TAF).   Overall individual is doing well. All procedures carried out per protocol. Study medications dispensed. Plan to see participant again in Jan 2026.

## 2024-07-02 ENCOUNTER — Encounter (HOSPITAL_COMMUNITY): Payer: Self-pay

## 2024-07-02 ENCOUNTER — Ambulatory Visit (HOSPITAL_COMMUNITY): Admission: EM | Admit: 2024-07-02 | Discharge: 2024-07-02 | Disposition: A | Discharge status: Home / Self Care

## 2024-07-02 DIAGNOSIS — J4521 Mild intermittent asthma with (acute) exacerbation: Secondary | ICD-10-CM

## 2024-07-02 DIAGNOSIS — R051 Acute cough: Secondary | ICD-10-CM | POA: Diagnosis not present

## 2024-07-02 MED ORDER — METHYLPREDNISOLONE SODIUM SUCC 125 MG IJ SOLR
125.0000 mg | Freq: Once | INTRAMUSCULAR | Status: AC
  Administered 2024-07-02: 125 mg via INTRAMUSCULAR

## 2024-07-02 MED ORDER — IPRATROPIUM-ALBUTEROL 0.5-2.5 (3) MG/3ML IN SOLN
RESPIRATORY_TRACT | Status: AC
  Filled 2024-07-02: qty 3

## 2024-07-02 MED ORDER — IPRATROPIUM-ALBUTEROL 0.5-2.5 (3) MG/3ML IN SOLN
3.0000 mL | Freq: Once | RESPIRATORY_TRACT | Status: AC
  Administered 2024-07-02: 3 mL via RESPIRATORY_TRACT

## 2024-07-02 MED ORDER — ALBUTEROL SULFATE (2.5 MG/3ML) 0.083% IN NEBU: 2.5000 mg | INHALATION_SOLUTION | Freq: Once | RESPIRATORY_TRACT | Status: DC

## 2024-07-02 MED ORDER — METHYLPREDNISOLONE SODIUM SUCC 125 MG IJ SOLR
INTRAMUSCULAR | Status: AC
  Filled 2024-07-02: qty 2

## 2024-07-02 MED ORDER — IPRATROPIUM BROMIDE 0.02 % IN SOLN: 0.5000 mg | Freq: Once | RESPIRATORY_TRACT | Status: DC

## 2024-07-02 MED ORDER — ALBUTEROL SULFATE HFA 108 (90 BASE) MCG/ACT IN AERS: 2.0000 | INHALATION_SPRAY | RESPIRATORY_TRACT | 0 refills | Status: AC | PRN

## 2024-07-02 MED ORDER — ALBUTEROL SULFATE (2.5 MG/3ML) 0.083% IN NEBU
INHALATION_SOLUTION | RESPIRATORY_TRACT | Status: AC
  Filled 2024-07-02: qty 3

## 2024-07-02 NOTE — ED Provider Notes (Signed)
 [facility]    CSN: [id number] Arrival date & time: 07/02/24  0802      History   Chief Complaint Chief Complaint  Patient presents with   Cough    HPI Monique [name] is a 53 y.o. female.   Patient presents for evaluation of a cough, mild shortness of breath, diffusely feeling unwell.  Symptom onset was yesterday after she finished cleaning her house with some bleach.  Reports a history of asthma feels that this is the etiology of her symptoms.  She has not taken any medications for the symptoms.  Denies any fever, sore throat, runny nose, headache, chest pain, abdominal pain, vomiting, or diarrhea.  She states she does have an inhaler but did not use it.  The history is provided by the patient.  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   Lower extremity edema 01/12/2015   Lung abscess (HCC)    Morbid obesity (HCC)    Pneumonia    TB (tuberculosis)    Tonsillitis    Vaccine counseling 06/06/2022   Viral URI 09/16/2017   Weight gain 10/27/2018    Patient Active Problem List   Diagnosis Date Noted   Melena 02/17/2024   Gastroenteritis 11/26/2023   Diabetes (HCC) 10/16/2023   Vaccine counseling 06/06/2022   ASCUS with positive high risk HPV cervical 07/10/2021   Asthma 07/10/2021   Finger numbness 07/10/2021   Hyperlipidemia 07/06/2021   Screening for cervical cancer 04/24/2020   Encounter for screening mammogram for malignant neoplasm of breast 04/24/2020    Morbid obesity (HCC)    Seasonal allergies 01/26/2019   Healthcare maintenance 05/19/2018   Upper respiratory infection 09/16/2017   Exertional chest pain 05/13/2017   Obese 08/10/2015   Essential hypertension 01/12/2015   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      Home Medications    Prior to Admission medications   Medication Sig Start Date End Date Taking? Authorizing Provider  albuterol  (VENTOLIN  HFA) 108 (90 Base) MCG/ACT inhaler Inhale 1-2 puffs into the lungs every 6 (six) hours as needed for wheezing or shortness of breath. 11/18/23  Yes Masters, Katie, DO  albuterol  (VENTOLIN  HFA) 108 (90 Base) MCG/ACT inhaler Inhale 2 puffs into the lungs every 4 (four) hours as needed for wheezing or shortness of breath. 07/02/24  Yes Janet Therisa PARAS, FNP  amLODipine  (NORVASC ) 10 MG tablet Take 10 mg by mouth daily. 06/23/24  Yes [provider]  cetirizine  (ZYRTEC  ALLERGY) 10 MG tablet Take 1 tablet (10 mg total) by mouth daily. 12/24/19 07/02/24 Yes Bloomfield, Carley D, DO  Dulaglutide  (TRULICITY ) 0.75 MG/0.5ML SOAJ  Inject 0.75 mg into the skin once a week. 02/17/24  Yes Jolaine Pac, DO  fluticasone  (FLONASE ) 50 MCG/ACT nasal spray Place 2 sprays into both nostrils daily. 07/12/22 07/02/24 Yes Jamison Rosaline LABOR, DO  Study [id number] - bictegravir/emtrictabine/tenofovir  alafenamide (BIKTARVY ) 50-200-25 mg or placebo tablet ([place]) Take 1 tablet by mouth daily. 04/02/24  Yes Orlando Burnard JONELLE DEVONNA  [place] [id number] - bictegravir/emtrictabine/tenofovir   alafenamide (BIKTARVY ) 50-200-25 mg or placebo tablet ([place]) Take 1 tablet by mouth daily. 06/25/24  Yes Phillips, Kelly R, PA-C  Study - [id number] - MK-8591A 100/0.25 mg or placebo tablet ([place]) Take 1 tablet by mouth daily. 04/02/24  Yes Orlando Burnard JONELLE DEVONNA  Study - [id number] - FX-1408J 100/0.25 mg or placebo tablet ([place]) Take 1 tablet by mouth daily. 06/25/24  Yes Orlando Burnard JONELLE, PA-C    Family History Family History  Problem Relation Age of Onset   Hypertension Mother    Hypertension Father    Diabetes Sister    Hypertension Sister     Social History Social History   Tobacco Use   Smoking status: Never   Smokeless tobacco: Never  Vaping Use   Vaping status: Never Used  Substance Use Topics   Alcohol use: Not Currently   Drug use: No     Allergies   Patient has no known allergies.   Review of Systems Review of Systems  Constitutional:  Positive for chills. Negative for fever.  HENT:  Negative for congestion, rhinorrhea and sore throat.   Eyes:  Negative for pain and redness.  Respiratory:  Positive for cough and shortness of breath.   Cardiovascular:  Negative for chest pain.  Gastrointestinal:  Negative for abdominal pain, diarrhea and vomiting.  Genitourinary:  Negative for dysuria.  Musculoskeletal:  Negative for back pain and neck pain.  Neurological:  Negative for dizziness and headaches.  Psychiatric/Behavioral:  Negative for agitation. The patient is not nervous/anxious.    Physical Exam Triage Vital Signs ED Triage Vitals  Encounter Vitals Group     BP 07/02/24 0824 (!) 135/95     Girls Systolic BP Percentile --      Girls Diastolic BP Percentile --      Boys Systolic BP Percentile --      Boys Diastolic BP Percentile --      Pulse Rate 07/02/24 0824 84     Resp 07/02/24 0824 18     Temp 07/02/24 0824 98.1 F (36.7 C)     Temp Source 07/02/24 0824 Oral     SpO2 07/02/24 0824 95 %     Weight 07/02/24 0823 254 lb  (115.2 kg)     Height 07/02/24 0823 5' 6 (1.676 m)     Head Circumference --      Peak Flow --      Pain Score 07/02/24 0821 8     Pain Loc --      Pain Education --      Exclude from Growth Chart --    No data found.  Updated Vital Signs BP (!) 135/95 (BP Location: Right Arm)   Pulse 84   Temp 98.1 F (36.7 C) (Oral)   Resp 18   Ht 5' 6 (1.676 m)   Wt 254 lb (115.2 kg)   LMP 06/26/2024 (Approximate)   SpO2 95%   BMI 41.00 kg/m   Physical Exam Vitals and nursing note reviewed.  Constitutional:      General: She is not in acute  distress.    Appearance: Normal appearance.  HENT:     Head: Normocephalic.     Right Ear: Tympanic membrane and ear canal normal.     Left Ear: Tympanic membrane and ear canal normal.     Nose: Nose normal. No congestion.     Mouth/Throat:     Mouth: Mucous membranes are moist.     Pharynx: No posterior oropharyngeal erythema.  Eyes:     Extraocular Movements: Extraocular movements intact.     Pupils: Pupils are equal, round, and reactive to light.  Cardiovascular:     Rate and Rhythm: Normal rate and regular rhythm.     Heart sounds: Normal heart sounds. No murmur heard. Pulmonary:     Effort: Pulmonary effort is normal.     Breath sounds: Decreased air movement present. Wheezing (Diffuse expiratory wheezing bilaterally) present.  Abdominal:     General: Bowel sounds are normal.  Skin:    General: Skin is warm and dry.  Neurological:     General: No focal deficit present.     Mental Status: She is alert and oriented to person, place, and time.  Psychiatric:        Mood and Affect: Mood normal.        Behavior: Behavior normal.        Thought Content: Thought content normal.        Judgment: Judgment normal.    UC Treatments / Results  Labs (all labs ordered are listed, but only abnormal results are displayed) Labs Reviewed - No data to display  EKG   Radiology No results found.  Procedures Procedures (including  critical care time)  Medications Ordered in UC Medications  methylPREDNISolone  sodium succinate (SOLU-MEDROL ) 125 mg/2 mL injection 125 mg (125 mg Intramuscular Given 07/02/24 0851)  ipratropium-albuterol  (DUONEB) 0.5-2.5 (3) MG/3ML nebulizer solution 3 mL (3 mLs Nebulization Given 07/02/24 0856)    Initial Impression / Assessment and Plan / UC Course  I have reviewed the triage vital signs and the nursing notes.  Pertinent labs & imaging results that were available during my care of the patient were reviewed by me and considered in my medical decision making (see chart for details).    Patient presents for evaluation of a cough, shortness of breath, and wheezing that started yesterday after she cleaned her home with bleach.  No reported fever, sore throat, headache, abdominal pain, vomiting, or diarrhea.  On physical exam, she had diminished lung sounds throughout with diffuse expiratory wheezing.  She was provided a DuoNeb nebulized treatment and injection of Solu-Medrol .  Following these treatments, she verbalized a significant improvement in her symptoms and stated her chest felt open and she was breathing better.  Improved air exchange bilaterally and diffuse expiratory wheezing almost resolved.  A refill of her albuterol  inhaler was provided with the instructions 2 puffs every 4 hours for the next 1 to 2 days while awake.  Encouraged rest and close observation for new or worsening symptoms.  Final Clinical Impressions(s) / UC Diagnoses   Final diagnoses:  Mild intermittent asthma with exacerbation  Acute cough     Discharge Instructions      You were treated for an asthma exacerbation today with a DuoNeb and an injection of a steroid.  Would recommend rest today, ensure adequate oral hydration, and monitor closely for any new or worsening symptoms.  A refill of your albuterol  inhaler has been sent to the pharmacy on file.  2 puffs every 4 hours while  awake for the next 1 to 2 days.   Return for evaluation should your symptoms persist or worsen.     ED Prescriptions     Medication Sig Dispense Auth. Provider   albuterol  (VENTOLIN  HFA) 108 (90 Base) MCG/ACT inhaler Inhale 2 puffs into the lungs every 4 (four) hours as needed for wheezing or shortness of breath. 18 g Janet Therisa PARAS, FNP      PDMP not reviewed this encounter.   Janet Therisa PARAS, FNP 07/02/24 [phone]

## 2024-07-02 NOTE — ED Triage Notes (Signed)
 Patient presenting with wheezing, coughing, hot flashes and chills onset yesterday. No known sick exposure. Patient has history of asthma.   Patient has not taken any meds for her symptoms. Prescribed inhaler used with little relief.

## 2024-07-02 NOTE — Discharge Instructions (Addendum)
 You were treated for an asthma exacerbation today with a DuoNeb and an injection of a steroid.  Would recommend rest today, ensure adequate oral hydration, and monitor closely for any new or worsening symptoms.  A refill of your albuterol  inhaler has been sent to the pharmacy on file.  2 puffs every 4 hours while awake for the next 1 to 2 days.  Return for evaluation should your symptoms persist or worsen.

## 2024-08-15 ENCOUNTER — Encounter (HOSPITAL_COMMUNITY): Payer: Self-pay

## 2024-08-15 ENCOUNTER — Ambulatory Visit (HOSPITAL_COMMUNITY): Admission: EM | Admit: 2024-08-15 | Discharge: 2024-08-15 | Disposition: A | Discharge status: Home / Self Care

## 2024-08-15 ENCOUNTER — Ambulatory Visit (HOSPITAL_COMMUNITY)

## 2024-08-15 DIAGNOSIS — M25511 Pain in right shoulder: Secondary | ICD-10-CM

## 2024-08-15 MED ORDER — BACLOFEN 10 MG PO TABS: 10.0000 mg | ORAL_TABLET | Freq: Three times a day (TID) | ORAL | 0 refills | Status: AC

## 2024-08-15 MED ORDER — KETOROLAC TROMETHAMINE 60 MG/2ML IM SOLN
INTRAMUSCULAR | Status: AC
  Filled 2024-08-15: qty 2

## 2024-08-15 MED ORDER — KETOROLAC TROMETHAMINE 60 MG/2ML IM SOLN
60.0000 mg | Freq: Once | INTRAMUSCULAR | Status: AC
  Administered 2024-08-15: 60 mg via INTRAMUSCULAR

## 2024-08-15 MED ORDER — DICLOFENAC SODIUM 50 MG PO TBEC: 50.0000 mg | DELAYED_RELEASE_TABLET | Freq: Two times a day (BID) | ORAL | 1 refills | Status: DC

## 2024-08-15 NOTE — Discharge Instructions (Addendum)
" °  1. Acute pain of right shoulder (Primary) - ketorolac  (TORADOL ) injection 60 mg given in UC for acute pain secondary to shoulder strain - DG Shoulder Right x-ray performed in UC shows no acute fracture or dislocation, no evidence of arthropathy, no acute skeletal injury. - diclofenac  (VOLTAREN ) 50 MG EC tablet; Take 1 tablet (50 mg total) by mouth 2 (two) times daily.  Dispense: 30 tablet; Refill: 1 - baclofen  (LIORESAL ) 10 MG tablet; Take 1 tablet (10 mg total) by mouth 3 (three) times daily.  Dispense: 30 each; Refill: 0 - AMB referral to orthopedics for follow-up evaluation and treatment if symptoms do not improve with current therapy and rest.  -Continue to monitor symptoms for any change in severity if there is any escalation of current symptoms or development of new symptoms follow-up in ER for further evaluation and management. "

## 2024-08-15 NOTE — ED Provider Notes (Signed)
 " [facility]  Note:  This document was prepared using Dragon voice recognition software and may include unintentional dictation errors.  MRN: [id number] DOB: [birth year]  Subjective:   Monique [name] is a 53 y.o. female presenting for right shoulder pain x 2 days.  Patient reports that she was mopping when she felt pain in her right anterior shoulder.  Patient has been taking ibuprofen  and Aleve since with mild relief of pain.  Patient reports increased pain with right arm movement.  Range of motion severely decreased.  Patient denies any past history of shoulder pain or injury.  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   Lower extremity edema 01/12/2015   Lung abscess (HCC)    Morbid obesity (HCC)    Pneumonia    TB (tuberculosis)    Tonsillitis    Vaccine counseling 06/06/2022   Viral URI 09/16/2017   Weight gain 10/27/2018     Past Surgical History:  Procedure Laterality Date   CESAREAN SECTION     VIDEO BRONCHOSCOPY Bilateral 04/13/2013   Procedure: VIDEO BRONCHOSCOPY WITHOUT FLUORO;  Surgeon: Dorethia Cave, MD;  Location: [facility] ENDOSCOPY;  Service: Cardiopulmonary;  Laterality: Bilateral;    Family History  Problem Relation Age of Onset   Hypertension Mother    Hypertension Father    Diabetes Sister    Hypertension Sister     Social History[3]  ROS Refer to HPI for ROS details.  Objective:    Vitals: BP (!) 154/97 (BP Location: Right Arm)   Pulse 93   Temp 99.4 F (37.4 C) (Oral)   Resp 16   LMP 07/20/2024 (Exact Date)   SpO2 93%   Physical Exam Vitals and nursing note reviewed.  Constitutional:       General: She is not in acute distress.    Appearance: Normal appearance. She is well-developed. She is not ill-appearing or toxic-appearing.  HENT:     Head: Normocephalic and atraumatic.  Cardiovascular:     Rate and Rhythm: Normal rate.  Pulmonary:     Effort: Pulmonary effort is normal. No respiratory distress.     Breath sounds: No stridor. No wheezing.  Musculoskeletal:     Right shoulder: Tenderness and crepitus present. No swelling, deformity or bony tenderness. Decreased range of motion. Decreased strength. Normal pulse.       Arms:  Skin:    General: Skin is warm and dry.  Neurological:     General: No focal deficit present.     Mental Status: She is alert and oriented to person, place, and time.  Psychiatric:        Mood and Affect: Mood normal.        Behavior: Behavior normal.     Procedures  No results found for this or any previous visit (from the past 24 hours).  Assessment and Plan :     Discharge Instructions       1. Acute pain of right shoulder (Primary) - ketorolac  (TORADOL ) injection 60 mg given in UC for acute pain secondary to shoulder strain - DG Shoulder Right x-ray performed in UC shows no acute fracture or dislocation, no evidence of arthropathy, no acute skeletal  injury. - diclofenac  (VOLTAREN ) 50 MG EC tablet; Take 1 tablet (50 mg total) by mouth 2 (two) times daily.  Dispense: 30 tablet; Refill: 1 - baclofen  (LIORESAL ) 10 MG tablet; Take 1 tablet (10 mg total) by mouth 3 (three) times daily.  Dispense: 30 each; Refill: 0 - AMB referral to orthopedics for follow-up evaluation and treatment if symptoms do not improve with current therapy and rest.  -Continue to monitor symptoms for any change in severity if there is any escalation of current symptoms or development of new symptoms follow-up in ER for further evaluation and management.      Jamonta Goerner B Saoirse Legere    [1] No current facility-administered medications for this  encounter.  Current Outpatient Medications:    baclofen  (LIORESAL ) 10 MG tablet, Take 1 tablet (10 mg total) by mouth 3 (three) times daily., Disp: 30 each, Rfl: 0   diclofenac  (VOLTAREN ) 50 MG EC tablet, Take 1 tablet (50 mg total) by mouth 2 (two) times daily., Disp: 30 tablet, Rfl: 1   albuterol  (VENTOLIN  HFA) 108 (90 Base) MCG/ACT inhaler, Inhale 1-2 puffs into the lungs every 6 (six) hours as needed for wheezing or shortness of breath., Disp: 18 g, Rfl: 5   albuterol  (VENTOLIN  HFA) 108 (90 Base) MCG/ACT inhaler, Inhale 2 puffs into the lungs every 4 (four) hours as needed for wheezing or shortness of breath., Disp: 18 g, Rfl: 0   amLODipine  (NORVASC ) 10 MG tablet, Take 10 mg by mouth daily., Disp: , Rfl:    cetirizine  (ZYRTEC  ALLERGY) 10 MG tablet, Take 1 tablet (10 mg total) by mouth daily., Disp: 30 tablet, Rfl: 2   Dulaglutide  (TRULICITY ) 0.75 MG/0.5ML SOAJ, Inject 0.75 mg into the skin once a week., Disp: 2 mL, Rfl: 3   fluticasone  (FLONASE ) 50 MCG/ACT nasal spray, Place 2 sprays into both nostrils daily., Disp: 1 g, Rfl: 0   Study - [id number] - bictegravir/emtrictabine/tenofovir  alafenamide (BIKTARVY ) 50-200-25 mg or placebo tablet ([facility]), Take 1 tablet by mouth daily., Disp: 105 tablet, Rfl: 0   Study - [id number] - bictegravir/emtrictabine/tenofovir  alafenamide (BIKTARVY ) 50-200-25 mg or placebo tablet ([facility]), Take 1 tablet by mouth daily., Disp: 105 tablet, Rfl: 0   Study - [id number] - MK-8591A 100/0.25 mg or placebo tablet ([place]), Take 1 tablet by mouth daily., Disp: 105 tablet, Rfl: 0   Study - [id number] - MK-8591A 100/0.25 mg or placebo tablet ([place]), Take 1 tablet by mouth daily., Disp: 105 tablet, Rfl: 0 [2] No Known Allergies [3]  Social History Tobacco Use   Smoking status: Never   Smokeless tobacco: Never  Vaping Use   Vaping status: Never Used  Substance Use Topics   Alcohol use: Not Currently   Drug use: No     Aurea Goodell  B, NP 08/15/24 1737  "

## 2024-08-15 NOTE — ED Triage Notes (Signed)
 Patient here today with c/o right shoulder pain X 2 days while at work. Patient states that she has increased pain with ROM. Patient has taken Ibuprofen  and Aleve with some relief.

## 2024-09-15 ENCOUNTER — Ambulatory Visit: Payer: Self-pay | Admitting: Student

## 2024-09-15 ENCOUNTER — Ambulatory Visit: Payer: Self-pay

## 2024-09-15 ENCOUNTER — Encounter: Payer: Self-pay | Admitting: Student

## 2024-09-15 VITALS — BP 168/110 | HR 99 | Temp 98.6°F | Ht 66.0 in | Wt 251.6 lb

## 2024-09-15 DIAGNOSIS — R051 Acute cough: Secondary | ICD-10-CM

## 2024-09-15 LAB — RESP PANEL BY RT-PCR (FLU A&B, COVID) ARPGX2
Influenza A by PCR: NEGATIVE
Influenza B by PCR: NEGATIVE
SARS Coronavirus 2 by RT PCR: NEGATIVE

## 2024-09-15 MED ORDER — BENZONATATE 100 MG PO CAPS: 100.0000 mg | ORAL_CAPSULE | Freq: Three times a day (TID) | ORAL | 0 refills | Status: DC | PRN

## 2024-09-15 MED ORDER — IPRATROPIUM-ALBUTEROL 0.5-2.5 (3) MG/3ML IN SOLN
3.0000 mL | Freq: Once | RESPIRATORY_TRACT | Status: AC
  Administered 2024-09-15: 3 mL via RESPIRATORY_TRACT

## 2024-09-15 MED ORDER — PREDNISONE 20 MG PO TABS: 40.0000 mg | ORAL_TABLET | Freq: Every day | ORAL | 0 refills | Status: AC

## 2024-09-15 MED ORDER — GUAIFENESIN-DM 100-10 MG/5ML PO SYRP: 5.0000 mL | ORAL_SOLUTION | ORAL | 0 refills | Status: DC | PRN

## 2024-09-15 NOTE — Telephone Encounter (Signed)
 FYI Only or Action Required?: FYI only for provider: appointment scheduled on 09/15/24.  Patient was last seen in primary care on 02/17/2024 by Jolaine Pac, DO.  Called Nurse Triage reporting Cough/nasal congestion.  Symptoms began 09/13/23.  Interventions attempted: OTC medications: Theraflu and Rest, hydration, or home remedies.  Symptoms are: gradually worsening.  Triage Disposition: Home Care- pt requested appt.   Patient/caregiver understands and will follow disposition?:       Message from Susanna ORN sent at 09/15/2024  9:21 AM EST  Reason for Triage: Patient wanting to be seen today. States she's coughing & she has lower back pain & needs her blood pressure checked. Also mentioned pain around her waist area. States she has been taking theraflu.   Reason for Disposition  Cough with cold symptoms (e.g., runny nose, postnasal drip, throat clearing)  Answer Assessment - Initial Assessment Questions 1. ONSET: When did the cough begin?      09/13/23 2. SEVERITY: How bad is the cough today?      Coughing episodes /frequent cough 3. SPUTUM: Describe the color of your sputum (e.g., none, dry cough; clear, white, yellow, green)     Dry cough 4. HEMOPTYSIS: Are you coughing up any blood? If Yes, ask: How much? (e.g., flecks, streaks, tablespoons, etc.)     no 5. DIFFICULTY BREATHING: Are you having difficulty breathing? If Yes, ask: How bad is it? (e.g., mild, moderate, severe)      no 6. FEVER: Do you have a fever? If Yes, ask: What is your temperature, how was it measured, and when did it start?     no 7. CARDIAC HISTORY: Do you have any history of heart disease? (e.g., heart attack, congestive heart failure)      HTN  8. LUNG HISTORY: Do you have any history of lung disease?  (e.g., pulmonary embolus, asthma, emphysema)     no 9. PE RISK FACTORS: Do you have a history of blood clots? (or: recent major surgery, recent prolonged travel, bedridden)      no 10. OTHER SYMPTOMS: Do you have any other symptoms? (e.g., runny nose, wheezing, chest pain)       Nasal congestion/runny nose 12. TRAVEL: Have you traveled out of the country in the last month? (e.g., travel history, exposures)       no  Protocols used: Cough - Acute Non-Productive-A-AH

## 2024-09-15 NOTE — Patient Instructions (Addendum)
 Monique [name], Thank you for allowing me to take part in your care today.  Here are your instructions.  1.  I am treating you for an asthma exacerbation.  I started you on steroids.  Please take prednisone  40 mg for the next 5 days.  2.  I sent in a cough suppressant as well.  Take this as needed.  3.  You can take cough syrup nightly.  I wrote this for you as well.  4.  Please come back in 1 week if you are not improving.  5.  Go get your chest x-ray.  I will call you with results  6.  I will call you with the results of your flu test.  PLEASE BRING YOUR MEDICATIONS TO EVERY APPOINTMENT  Thank you, Dr. Tobie  If you have any other questions please contact the internal medicine clinic at [phone] If it is after hours, please call the [place] at [phone] and then ask the person who picks up for the resident on call.

## 2024-09-15 NOTE — Addendum Note (Signed)
 Addended by: [place] Dani F on: 09/15/2024 04:55 PM   Modules accepted: Orders

## 2024-09-15 NOTE — Progress Notes (Signed)
 "  CC: Acute visit concerning cough and congestion  HPI:  [name] is a 54 y.o. female with a past medical history of asthma.  She presents for acute concern for cough and congestion for the past 2 weeks.  Please see assessment and plan for full HPI.  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   Lower extremity edema 01/12/2015   Lung abscess (HCC)    Morbid obesity (HCC)    Pneumonia    TB (tuberculosis)    Tonsillitis    Vaccine counseling 06/06/2022   Viral URI 09/16/2017   Weight gain 10/27/2018    Current Medications[1]  Review of Systems:    Respiratory: Patient endorses cough and congestion, with wheezing  Physical Exam:  Vitals:   09/15/24 1431 09/15/24 1448  BP: (!) 168/121 (!) 168/110  Pulse: 97 99  Temp: 98.6 F (37 C)   TempSrc: Oral   SpO2: 92%   Weight: 251 lb 9.6 oz (114.1 kg)   Height: 5' 6 (1.676 m)     General: Patient is sitting comfortably in the room  Head: Normocephalic, atraumatic  Cardio: Regular rate and rhythm, no murmurs, rubs or gallops Pulmonary: Bilateral expiratory wheezing appreciated.  Congestion appreciated.   Assessment & Plan:   Assessment & Plan Acute cough Patient presents with 2-week history of cough, congestion.  She reports sick contact and her grandson.  She denies any fevers or chills.  She reports increased mucus production, wheezing, and shortness of breath.  Patient likely has asthma exacerbation in the setting of exposure to flu.  On my exam today patient does have wheezing.  Gave her a DuoNeb in the office today.  Will treat her as an asthma exacerbation likely in the setting of flu.  Will obtain chest x-ray as  well. Patient has had her COVID booster but did not get her flu vaccine this year.   Plan: - DuoNebs in office - Patient encouraged to continue using home inhalers - Steroids 40 mg prednisone  for the next 5 days - Patient encouraged to return if symptoms not improving - Cough suppressant sent in - Follow-up chest x-ray  Patient discussed with Dr. Rosan Libby Blanch, DO Internal Medicine Resident PGY-3     [1]  Current Outpatient Medications:    benzonatate  (TESSALON ) 100 MG capsule, Take 1 capsule (100 mg total) by mouth 3 (three) times daily as needed for cough., Disp: 30 capsule, Rfl: 0   guaiFENesin -dextromethorphan  (ROBITUSSIN DM) 100-10 MG/5ML syrup, Take 5 mLs by mouth every 4 (four) hours as needed for cough., Disp: 118 mL, Rfl: 0   predniSONE  (DELTASONE ) 20 MG tablet, Take 2 tablets (40 mg total) by mouth daily with breakfast for 5 days., Disp: 10 tablet, Rfl: 0   albuterol  (VENTOLIN  HFA) 108 (90 Base) MCG/ACT inhaler, Inhale 1-2 puffs into the lungs every 6 (six) hours as needed for wheezing or shortness of breath., Disp: 18 g, Rfl: 5   albuterol  (VENTOLIN  HFA) 108 (90 Base) MCG/ACT inhaler, Inhale 2 puffs into the lungs every 4 (four) hours as needed for wheezing or shortness of breath., Disp: 18 g, Rfl: 0   amLODipine  (NORVASC ) 10 MG tablet, Take 10 mg by mouth daily.,  Disp: , Rfl:    baclofen  (LIORESAL ) 10 MG tablet, Take 1 tablet (10 mg total) by mouth 3 (three) times daily., Disp: 30 each, Rfl: 0   cetirizine  (ZYRTEC  ALLERGY) 10 MG tablet, Take 1 tablet (10 mg total) by mouth daily., Disp: 30 tablet, Rfl: 2   diclofenac  (VOLTAREN ) 50 MG EC tablet, Take 1 tablet (50 mg total) by mouth 2 (two) times daily., Disp: 30 tablet, Rfl: 1   Dulaglutide  (TRULICITY ) 0.75 MG/0.5ML SOAJ, Inject 0.75 mg into the skin once a week., Disp: 2 mL, Rfl: 3   fluticasone  (FLONASE ) 50 MCG/ACT nasal spray, Place 2 sprays into both nostrils daily., Disp: 1 g, Rfl: 0   Study - [id number] -  bictegravir/emtrictabine/tenofovir  alafenamide (BIKTARVY ) 50-200-25 mg or placebo tablet ([place]), Take 1 tablet by mouth daily., Disp: 105 tablet, Rfl: 0   Study - [id number] - bictegravir/emtrictabine/tenofovir  alafenamide (BIKTARVY ) 50-200-25 mg or placebo tablet ([facility]), Take 1 tablet by mouth daily., Disp: 105 tablet, Rfl: 0   Study - [id number] - MK-8591A 100/0.25 mg or placebo tablet ([place]), Take 1 tablet by mouth daily., Disp: 105 tablet, Rfl: 0   Study - [id number] - MK-8591A 100/0.25 mg or placebo tablet ([facility]), Take 1 tablet by mouth daily., Disp: 105 tablet, Rfl: 0  "

## 2024-09-15 NOTE — Telephone Encounter (Addendum)
 Pt disconnected call prior to speaking to NT. Attempt was made to contact pt, VMB is full, could not leave a VM.Routing to callbacks.   Reason for Triage: Patient wanting to be seen today. States she's coughing & she has lower back pain & needs her blood pressure checked. Also mentioned pain around her waist area. States she has been taking theraflu.

## 2024-09-15 NOTE — Telephone Encounter (Signed)
 2nd CB attempt: VM box full and unable to leave message    Message from Susanna ORN sent at 09/15/2024  9:21 AM EST  Reason for Triage: Patient wanting to be seen today. States she's coughing & she has lower back pain & needs her blood pressure checked. Also mentioned pain around her waist area. States she has been taking theraflu.

## 2024-09-16 NOTE — Telephone Encounter (Signed)
 Pt saw Dr Tobie 09/15/24.

## 2024-09-17 ENCOUNTER — Other Ambulatory Visit: Payer: Self-pay

## 2024-09-17 DIAGNOSIS — Z006 Encounter for examination for normal comparison and control in clinical research program: Secondary | ICD-10-CM

## 2024-09-17 MED ORDER — STUDY - MK-8591A-052 - MK-8591A 100/0.25 MG OR PLACEBO TABLET (PI-VAN DAM): 1.0000 | ORAL_TABLET | Freq: Every day | ORAL | 0 refills | Status: AC

## 2024-09-17 MED ORDER — STUDY - MK-8591A-052 - BICTEGRAVIR/EMTRICITABINE/TENOFOVIR ALAFENAMIDE (BIKTARVY) 50-200-25 MG OR PLACEBO TABLET (PI-VAN DAM): 1.0000 | ORAL_TABLET | Freq: Every day | ORAL | 0 refills | Status: AC

## 2024-09-18 NOTE — Progress Notes (Signed)
 Internal Medicine Clinic Attending  Case discussed with the resident at the time of the visit.  We reviewed the resident's history and exam and pertinent patient test results.  I agree with the assessment, diagnosis, and plan of care documented in the resident's note.

## 2024-09-18 NOTE — Research (Signed)
 Individual seen for research visit Week 120 for Eyewitness, [id number], A Phase 3b, multicenter, single-arm, open-label study evaluating the efficacy, safety, and tolerability of switching to DTG/3TC single tablet regimen administered once daily from a bictegravir/emtricitabine /tenofovir  alafenamide single tablet regimen in people living with HIV of atleast 55 years of age who are virologically suppressed.Updated consent required, Inform Consent explained/reviewed, risk, benefits, responsibilities and other options were reviewed. Questions answered, comprehension of the study was assessed and adequate time to consider options was provided. Individual verbalized understanding and signed the informed consent witnessed by study coordinator. Copy provided to participant. Signed consent was obtained prior to any study procedures being completed.  Overall individual is doing well recovering from a cough and congestion. BP elevated, even after 5 mins of rest, did not take BP medication today, reviewed S&S that warrant emergency care, asymptomatic today, encouraged to follow up with PCP. All procedures carried out per protocol. Study medications dispensed. Plan to see participant again in 12 weeks.

## 2024-09-25 ENCOUNTER — Encounter (HOSPITAL_COMMUNITY): Payer: Self-pay | Admitting: Emergency Medicine

## 2024-09-25 ENCOUNTER — Ambulatory Visit (INDEPENDENT_AMBULATORY_CARE_PROVIDER_SITE_OTHER)

## 2024-09-25 ENCOUNTER — Ambulatory Visit (HOSPITAL_COMMUNITY)
Admission: EM | Admit: 2024-09-25 | Discharge: 2024-09-25 | Disposition: A | Discharge status: Home / Self Care | Attending: Family Medicine | Admitting: Family Medicine

## 2024-09-25 DIAGNOSIS — J4521 Mild intermittent asthma with (acute) exacerbation: Secondary | ICD-10-CM

## 2024-09-25 DIAGNOSIS — R070 Pain in throat: Secondary | ICD-10-CM

## 2024-09-25 DIAGNOSIS — R051 Acute cough: Secondary | ICD-10-CM

## 2024-09-25 DIAGNOSIS — J019 Acute sinusitis, unspecified: Secondary | ICD-10-CM

## 2024-09-25 LAB — POCT RAPID STREP A (OFFICE): Rapid Strep A Screen: NEGATIVE

## 2024-09-25 MED ORDER — IPRATROPIUM-ALBUTEROL 0.5-2.5 (3) MG/3ML IN SOLN
RESPIRATORY_TRACT | Status: AC
  Filled 2024-09-25: qty 3

## 2024-09-25 MED ORDER — AMLODIPINE BESYLATE 10 MG PO TABS: 10.0000 mg | ORAL_TABLET | Freq: Every day | ORAL | 0 refills | Status: AC

## 2024-09-25 MED ORDER — PROMETHAZINE-DM 6.25-15 MG/5ML PO SYRP: 5.0000 mL | ORAL_SOLUTION | Freq: Four times a day (QID) | ORAL | 0 refills | Status: AC | PRN

## 2024-09-25 MED ORDER — DOXYCYCLINE HYCLATE 100 MG PO CAPS: 100.0000 mg | ORAL_CAPSULE | Freq: Two times a day (BID) | ORAL | 0 refills | Status: AC

## 2024-09-25 MED ORDER — IPRATROPIUM-ALBUTEROL 0.5-2.5 (3) MG/3ML IN SOLN
3.0000 mL | Freq: Once | RESPIRATORY_TRACT | Status: AC
  Administered 2024-09-25: 3 mL via RESPIRATORY_TRACT

## 2024-09-25 MED ORDER — PREDNISONE 20 MG PO TABS: ORAL_TABLET | ORAL | 0 refills | Status: AC

## 2024-09-25 NOTE — ED Notes (Signed)
Pt st's she feels better after breathing tx

## 2024-09-25 NOTE — Discharge Instructions (Addendum)
 Your strep test was negative  Your chest x-ray did not show any new changes and did not show any fluid or pneumonia.  Take doxycycline  100 mg --1 capsule 2 times daily for 7 days; this is the antibiotic  Take prednisone  20 mg--3 tabs daily x2 days, then 2 tabs daily x2 days, then 1 tab daily x2 days, then one half tab daily x2 days, then stop  Take Phenergan  with dextromethorphan  syrup--5 mL or 1 teaspoon every 6 hours as needed for cough  I have sent in a month's supply of your amlodipine .  Please make a follow-up appointment with your primary care to review your blood pressure control and update prescriptions.

## 2024-09-25 NOTE — ED Triage Notes (Addendum)
 Pt c/o productive cough, sore throat and bil ear pain  Onset 2 weeks ago.  St's she was seen by her MD and was given prednisone  and cough meds but did not help.  Pt is also hypertensive but st's she did not take her HTN meds today because she is out of it

## 2024-09-25 NOTE — ED Provider Notes (Signed)
 " [facility]    CSN: [id number] Arrival date & time: 09/25/24  0801      History   Chief Complaint Chief Complaint  Patient presents with   Cough    HPI Monique [name] is a 54 y.o. female.    Cough  Here for cough and congestion and wheezing.  She first got sick over 2 weeks ago.  She was seen at her primary care clinic on the 20th and treated for an asthma exacerbation.  She did improve a little while she was on the prednisone  but she worsened again after off it.  She is still producing a good bit of mucus and has a lot of rhinorrhea.  She is coughing up mucus.  And still wheezing.  She last used her inhaler last night.  No fever at any point.  She has had some sore throat in the last few days, mainly on her left side.  NKDA  Last menstrual cycle was December 31.  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   Lower extremity edema 01/12/2015   Lung abscess (HCC)    Morbid obesity (HCC)    Pneumonia    TB (tuberculosis)    Tonsillitis    Vaccine counseling 06/06/2022   Viral URI 09/16/2017   Weight gain 10/27/2018    Patient Active Problem List   Diagnosis Date Noted   Melena 02/17/2024   Gastroenteritis 11/26/2023   Diabetes (HCC) 10/16/2023   Vaccine counseling 06/06/2022   ASCUS with positive high risk HPV cervical 07/10/2021   Asthma 07/10/2021   Finger numbness 07/10/2021   Hyperlipidemia 07/06/2021   Screening for cervical cancer 04/24/2020   Encounter for screening mammogram for malignant neoplasm of breast 04/24/2020   Morbid obesity (HCC)    Seasonal allergies 01/26/2019   Healthcare maintenance 05/19/2018   Upper respiratory infection  09/16/2017   Exertional chest pain 05/13/2017   Obese 08/10/2015   Essential hypertension 01/12/2015   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      Home Medications    Prior to Admission medications  Medication Sig Start Date End Date Taking? Authorizing Provider  doxycycline  (VIBRAMYCIN ) 100 MG capsule Take 1 capsule (100 mg total) by mouth 2 (two) times daily for 7 days. 09/25/24 10/02/24 Yes Vonna Sharlet POUR, MD  predniSONE  (DELTASONE ) 20 MG tablet 3 tabs daily x2 days, then 2 tabs daily x2 days, then 1 tab daily x2 days, then one half tab daily x2 days, then stop 09/25/24  Yes Elyssia Strausser, Sharlet POUR, MD  promethazine -dextromethorphan  (PROMETHAZINE -DM) 6.25-15 MG/5ML syrup Take 5 mLs by mouth 4 (four) times daily as needed for cough. 09/25/24  Yes Vonna Sharlet POUR, MD  albuterol  (VENTOLIN  HFA) 108 (90 Base) MCG/ACT inhaler Inhale 2 puffs into the lungs every 4 (four) hours as needed for  wheezing or shortness of breath. 07/02/24   Janet Therisa PARAS, FNP  amLODipine  (NORVASC ) 10 MG tablet Take 1 tablet (10 mg total) by mouth daily. 09/25/24   Vonna Sharlet POUR, MD  baclofen  (LIORESAL ) 10 MG tablet Take 1 tablet (10 mg total) by mouth 3 (three) times daily. 08/15/24   Reddick, Johnathan B, NP  cetirizine  (ZYRTEC  ALLERGY) 10 MG tablet Take 1 tablet (10 mg total) by mouth daily. 12/24/19 07/02/24  Bloomfield, Rumaldo D, DO  Dulaglutide  (TRULICITY ) 0.75 MG/0.5ML SOAJ Inject 0.75 mg into the skin once a week. 02/17/24   Jolaine Pac, DO  fluticasone  (FLONASE ) 50 MCG/ACT nasal spray Place  2 sprays into both nostrils daily. 07/12/22 07/02/24  Jamison Rosaline LABOR, DO  Study - [id number] - bictegravir/emtrictabine/tenofovir  alafenamide (BIKTARVY ) 50-200-25 mg or placebo tablet ([facility]) Take 1 tablet by mouth daily. 06/25/24   Orlando Burnard SAUNDERS, PA-C  Study [phone] - bictegravir/emtrictabine/tenofovir  alafenamide (BIKTARVY ) 50-200-25 mg or placebo tablet ([facility]) Take 1 tablet by mouth daily. 09/17/24   Orlando Burnard SAUNDERS, PA-C  Study - [id number] - MK-8591A 100/0.25 mg or placebo tablet ([facility]) Take 1 tablet by mouth daily. 06/25/24   Orlando Burnard SAUNDERS, PA-C  Study - [id number] - MK-8591A 100/0.25 mg or placebo tablet ([facility]) Take 1 tablet by mouth daily. 09/17/24   Orlando Burnard SAUNDERS, PA-C    Family History Family History  Problem Relation Age of Onset   Hypertension Mother    Hypertension Father    Diabetes Sister    Hypertension Sister     Social History Social History[1]   Allergies   Patient has no known allergies.   Review of Systems Review of Systems  Respiratory:  Positive for cough.      Physical Exam Triage Vital Signs ED Triage Vitals  Encounter Vitals Group     BP 09/25/24 0817 (!) 169/126     Girls Systolic BP Percentile --      Girls Diastolic BP Percentile --      Boys Systolic BP Percentile --      Boys Diastolic BP Percentile --      Pulse Rate 09/25/24 0817 96     Resp 09/25/24 0817 20     Temp 09/25/24 0817 98.3 F (36.8 C)     Temp Source 09/25/24 0817 Oral     SpO2 09/25/24 0817 93 %     Weight 09/25/24 0819 250 lb (113.4 kg)     Height 09/25/24 0819 4' 7 (1.397 m)     Head Circumference --      Peak Flow --      Pain Score 09/25/24 0819 8     Pain Loc --      Pain Education --      Exclude from Growth Chart --    No data found.  Updated Vital Signs BP (!) 169/126 (BP Location: Left Arm)   Pulse 96   Temp 98.3 F (36.8 C) (Oral)   Resp 20   Ht 4' 7 (1.397 m)   Wt 113.4 kg   LMP 08/26/2024  (Exact Date)   SpO2 93%   BMI 58.11 kg/m   Visual Acuity Right Eye Distance:   Left Eye Distance:   Bilateral Distance:    Right Eye Near:   Left Eye Near:    Bilateral Near:     Physical Exam Vitals reviewed.  Constitutional:      General: She is not in acute distress.    Appearance:  She is not ill-appearing, toxic-appearing or diaphoretic.  HENT:     Nose: Congestion present.     Mouth/Throat:     Mouth: Mucous membranes are moist.     Comments: There is erythema of the entire posterior oropharynx.  There is no tonsillar hypertrophy and no white or yellow exudates seen today.  There is no peritonsillar swelling.  Uvula is in the midline. Eyes:     Extraocular Movements: Extraocular movements intact.     Conjunctiva/sclera: Conjunctivae normal.     Pupils: Pupils are equal, round, and reactive to light.  Cardiovascular:     Rate and Rhythm: Normal rate and regular rhythm.     Heart sounds: No murmur heard. Pulmonary:     Effort: No respiratory distress.     Breath sounds: No stridor. No rhonchi or rales.     Comments: There is expiratory wheezing heard when she coughs.  Her movement is fairly good Musculoskeletal:     Cervical back: Neck supple.  Lymphadenopathy:     Cervical: No cervical adenopathy.  Skin:    Capillary Refill: Capillary refill takes less than 2 seconds.     Coloration: Skin is not jaundiced or pale.  Neurological:     General: No focal deficit present.     Mental Status: She is alert and oriented to person, place, and time.  Psychiatric:        Behavior: Behavior normal.      UC Treatments / Results  Labs (all labs ordered are listed, but only abnormal results are displayed) Labs Reviewed  POCT RAPID STREP A (OFFICE)    EKG   Radiology DG Chest 2 View Result Date: 09/25/2024 CLINICAL DATA:  Two-week history of cough and congestion EXAM: DG CHEST 2V COMPARISON:  Chest radiograph dated 01/31/2024 FINDINGS: Normal lung volumes. Unchanged  lateral left upper lung linear atelectasis/scarring and associated pleural thickening. No pleural effusion or pneumothorax. Similar enlarged cardiomediastinal silhouette. No acute osseous abnormality. IMPRESSION: 1.  No focal consolidations. 2. Unchanged lateral left upper lung linear atelectasis/scarring and associated pleural thickening. Electronically Signed   By: Limin  Xu M.D.   On: 09/25/2024 09:36    Procedures Procedures (including critical care time)  Medications Ordered in UC Medications  ipratropium-albuterol  (DUONEB) 0.5-2.5 (3) MG/3ML nebulizer solution 3 mL (3 mLs Nebulization Given 09/25/24 0859)    Initial Impression / Assessment and Plan / UC Course  I have reviewed the triage vital signs and the nursing notes.  Pertinent labs & imaging results that were available during my care of the patient were reviewed by me and considered in my medical decision making (see chart for details).     Chest x-ray does not show any infiltrate or fluid.  There is stable left upper lung scarring.  Strep test is negative.  I did not send a culture as I am treating her with antibiotics for acute sinusitis, due to the length of her symptoms  The DuoNeb treatment did provide her some relief, and she is coughing with after it.  Lung exam is improved and there is no wheezing after the treatment.  Doxycycline  is sent in for the sinusitis and a prednisone  taper is sent in for the continued asthma exacerbation.  Amlodipine  is sent in as she is out.  I have asked her to follow-up with her primary care about her hypertension  Promethazine  DM is sent in for the cough Final Clinical Impressions(s) / UC Diagnoses   Final diagnoses:  Acute cough  Throat  pain  Acute sinusitis, recurrence not specified, unspecified location  Mild intermittent asthma with exacerbation     Discharge Instructions      Your strep test was negative  Your chest x-ray did not show any new changes and did not show  any fluid or pneumonia.  Take doxycycline  100 mg --1 capsule 2 times daily for 7 days; this is the antibiotic  Take prednisone  20 mg--3 tabs daily x2 days, then 2 tabs daily x2 days, then 1 tab daily x2 days, then one half tab daily x2 days, then stop  Take Phenergan  with dextromethorphan  syrup--5 mL or 1 teaspoon every 6 hours as needed for cough  I have sent in a month's supply of your amlodipine .  Please make a follow-up appointment with your primary care to review your blood pressure control and update prescriptions.     ED Prescriptions     Medication Sig Dispense Auth. Provider   amLODipine  (NORVASC ) 10 MG tablet Take 1 tablet (10 mg total) by mouth daily. 30 tablet Yaremi Stahlman, Sharlet POUR, MD   predniSONE  (DELTASONE ) 20 MG tablet 3 tabs daily x2 days, then 2 tabs daily x2 days, then 1 tab daily x2 days, then one half tab daily x2 days, then stop 20 tablet Miasha Emmons K, MD   doxycycline  (VIBRAMYCIN ) 100 MG capsule Take 1 capsule (100 mg total) by mouth 2 (two) times daily for 7 days. 14 capsule Vonna Sharlet POUR, MD   promethazine -dextromethorphan  (PROMETHAZINE -DM) 6.25-15 MG/5ML syrup Take 5 mLs by mouth 4 (four) times daily as needed for cough. 118 mL Vonna Sharlet POUR, MD      PDMP not reviewed this encounter.     [1]  Social History Tobacco Use   Smoking status: Never   Smokeless tobacco: Never  Vaping Use   Vaping status: Never Used  Substance Use Topics   Alcohol use: Not Currently   Drug use: No     Vonna Sharlet POUR, MD 09/25/24 [phone]  "

## 2024-10-26 ENCOUNTER — Ambulatory Visit: Payer: Self-pay | Admitting: Infectious Disease
